# Patient Record
Sex: Female | Born: 1955
Health system: Southern US, Community
[De-identification: ages and names within clinical notes are randomized; demographics above are authoritative.]

## PROBLEM LIST (undated history)

## (undated) DIAGNOSIS — B019 Varicella without complication: Secondary | ICD-10-CM

## (undated) DIAGNOSIS — M549 Dorsalgia, unspecified: Secondary | ICD-10-CM

## (undated) DIAGNOSIS — M4802 Spinal stenosis, cervical region: Secondary | ICD-10-CM

## (undated) DIAGNOSIS — Z789 Other specified health status: Secondary | ICD-10-CM

## (undated) DIAGNOSIS — F329 Major depressive disorder, single episode, unspecified: Secondary | ICD-10-CM

## (undated) DIAGNOSIS — C50919 Malignant neoplasm of unspecified site of unspecified female breast: Secondary | ICD-10-CM

## (undated) DIAGNOSIS — F419 Anxiety disorder, unspecified: Secondary | ICD-10-CM

## (undated) DIAGNOSIS — G8929 Other chronic pain: Secondary | ICD-10-CM

## (undated) DIAGNOSIS — G4700 Insomnia, unspecified: Secondary | ICD-10-CM

## (undated) DIAGNOSIS — Z923 Personal history of irradiation: Secondary | ICD-10-CM

## (undated) DIAGNOSIS — F1090 Alcohol use, unspecified, uncomplicated: Secondary | ICD-10-CM

## (undated) DIAGNOSIS — K635 Polyp of colon: Secondary | ICD-10-CM

## (undated) DIAGNOSIS — N6002 Solitary cyst of left breast: Secondary | ICD-10-CM

## (undated) DIAGNOSIS — J449 Chronic obstructive pulmonary disease, unspecified: Secondary | ICD-10-CM

## (undated) DIAGNOSIS — I10 Essential (primary) hypertension: Secondary | ICD-10-CM

## (undated) DIAGNOSIS — D649 Anemia, unspecified: Secondary | ICD-10-CM

## (undated) DIAGNOSIS — F32A Depression, unspecified: Secondary | ICD-10-CM

## (undated) HISTORY — PX: BREAST SURGERY: SHX581

## (undated) HISTORY — PX: NECK SURGERY: SHX720

## (undated) HISTORY — DX: Varicella without complication: B01.9

## (undated) HISTORY — PX: BREAST BIOPSY: SHX20

## (undated) HISTORY — PX: EPIDURAL BLOCK INJECTION: SHX1516

## (undated) HISTORY — DX: Spinal stenosis, cervical region: M48.02

## (undated) HISTORY — PX: ELBOW SURGERY: SHX618

## (undated) HISTORY — PX: BACK SURGERY: SHX140

## (undated) HISTORY — PX: CERVICAL FUSION: SHX112

## (undated) HISTORY — PX: TONSILLECTOMY: SUR1361

## (undated) HISTORY — DX: Polyp of colon: K63.5

## (undated) HISTORY — DX: Essential (primary) hypertension: I10

## (undated) HISTORY — DX: Insomnia, unspecified: G47.00

## (undated) HISTORY — DX: Chronic obstructive pulmonary disease, unspecified: J44.9

## (undated) HISTORY — PX: CERVICAL CONE BIOPSY: SUR198

---

## 2003-11-15 ENCOUNTER — Other Ambulatory Visit: Admission: RE | Admit: 2003-11-15 | Discharge: 2003-11-15 | Payer: Self-pay | Admitting: Internal Medicine

## 2003-11-15 ENCOUNTER — Encounter: Payer: Self-pay | Admitting: Internal Medicine

## 2003-12-16 ENCOUNTER — Ambulatory Visit: Payer: Self-pay | Admitting: Internal Medicine

## 2004-02-14 ENCOUNTER — Ambulatory Visit: Payer: Self-pay | Admitting: Internal Medicine

## 2004-03-27 ENCOUNTER — Ambulatory Visit: Payer: Self-pay | Admitting: General Surgery

## 2004-06-01 ENCOUNTER — Ambulatory Visit: Payer: Self-pay | Admitting: Family Medicine

## 2004-06-08 ENCOUNTER — Ambulatory Visit: Payer: Self-pay | Admitting: Internal Medicine

## 2004-06-14 ENCOUNTER — Ambulatory Visit: Payer: Self-pay | Admitting: Internal Medicine

## 2005-03-23 ENCOUNTER — Ambulatory Visit: Payer: Self-pay | Admitting: Internal Medicine

## 2005-04-05 ENCOUNTER — Ambulatory Visit: Payer: Self-pay | Admitting: General Surgery

## 2005-07-03 ENCOUNTER — Ambulatory Visit: Payer: Self-pay | Admitting: Internal Medicine

## 2005-07-06 ENCOUNTER — Ambulatory Visit: Payer: Self-pay | Admitting: Internal Medicine

## 2005-10-26 ENCOUNTER — Ambulatory Visit: Payer: Self-pay | Admitting: Internal Medicine

## 2005-12-25 ENCOUNTER — Encounter: Payer: Self-pay | Admitting: Family Medicine

## 2005-12-25 ENCOUNTER — Ambulatory Visit: Payer: Self-pay | Admitting: Family Medicine

## 2006-04-09 ENCOUNTER — Ambulatory Visit: Payer: Self-pay | Admitting: General Surgery

## 2006-12-03 ENCOUNTER — Encounter: Payer: Self-pay | Admitting: Internal Medicine

## 2006-12-23 ENCOUNTER — Telehealth: Payer: Self-pay | Admitting: Internal Medicine

## 2006-12-25 ENCOUNTER — Encounter: Payer: Self-pay | Admitting: Internal Medicine

## 2006-12-25 DIAGNOSIS — F39 Unspecified mood [affective] disorder: Secondary | ICD-10-CM | POA: Insufficient documentation

## 2006-12-25 DIAGNOSIS — J309 Allergic rhinitis, unspecified: Secondary | ICD-10-CM | POA: Insufficient documentation

## 2006-12-31 ENCOUNTER — Ambulatory Visit: Payer: Self-pay | Admitting: Internal Medicine

## 2006-12-31 DIAGNOSIS — G629 Polyneuropathy, unspecified: Secondary | ICD-10-CM | POA: Insufficient documentation

## 2006-12-31 DIAGNOSIS — G589 Mononeuropathy, unspecified: Secondary | ICD-10-CM

## 2007-01-06 LAB — CONVERTED CEMR LAB
ALT: 26 units/L (ref 0–35)
AST: 48 units/L — ABNORMAL HIGH (ref 0–37)
Albumin: 4 g/dL (ref 3.5–5.2)
Alkaline Phosphatase: 99 units/L (ref 39–117)
CO2: 28 meq/L (ref 19–32)
Creatinine, Ser: 0.5 mg/dL (ref 0.4–1.2)
Glucose, Bld: 104 mg/dL — ABNORMAL HIGH (ref 70–99)
HCT: 41.6 % (ref 36.0–46.0)
Hemoglobin: 14.5 g/dL (ref 12.0–15.0)
MCHC: 34.8 g/dL (ref 30.0–36.0)
MCV: 97.2 fL (ref 78.0–100.0)
Monocytes Relative: 5.9 % (ref 3.0–11.0)
Neutrophils Relative %: 77.9 % — ABNORMAL HIGH (ref 43.0–77.0)
Phosphorus: 2.9 mg/dL (ref 2.3–4.6)
Potassium: 4.1 meq/L (ref 3.5–5.1)
Sodium: 140 meq/L (ref 135–145)
Total Bilirubin: 0.9 mg/dL (ref 0.3–1.2)
Vitamin B-12: 228 pg/mL (ref 211–911)

## 2007-01-14 ENCOUNTER — Telehealth: Payer: Self-pay | Admitting: Internal Medicine

## 2007-01-14 ENCOUNTER — Encounter: Payer: Self-pay | Admitting: Internal Medicine

## 2007-01-22 ENCOUNTER — Encounter: Payer: Self-pay | Admitting: Internal Medicine

## 2007-08-26 ENCOUNTER — Telehealth: Payer: Self-pay | Admitting: Internal Medicine

## 2007-08-27 ENCOUNTER — Ambulatory Visit: Payer: Self-pay | Admitting: Internal Medicine

## 2007-09-02 ENCOUNTER — Encounter: Payer: Self-pay | Admitting: Internal Medicine

## 2007-09-03 ENCOUNTER — Telehealth: Payer: Self-pay | Admitting: Internal Medicine

## 2007-12-30 ENCOUNTER — Encounter: Admission: RE | Admit: 2007-12-30 | Discharge: 2007-12-30 | Payer: Self-pay | Admitting: Neurology

## 2008-01-09 ENCOUNTER — Encounter: Payer: Self-pay | Admitting: Internal Medicine

## 2008-02-18 ENCOUNTER — Encounter: Payer: Self-pay | Admitting: Internal Medicine

## 2008-03-05 ENCOUNTER — Encounter: Payer: Self-pay | Admitting: Internal Medicine

## 2008-03-23 ENCOUNTER — Ambulatory Visit (HOSPITAL_COMMUNITY): Admission: RE | Admit: 2008-03-23 | Discharge: 2008-03-24 | Payer: Self-pay | Admitting: Neurological Surgery

## 2008-04-20 ENCOUNTER — Encounter: Admission: RE | Admit: 2008-04-20 | Discharge: 2008-04-20 | Payer: Self-pay | Admitting: Orthopedic Surgery

## 2008-05-24 ENCOUNTER — Encounter: Admission: RE | Admit: 2008-05-24 | Discharge: 2008-05-24 | Payer: Self-pay | Admitting: Neurological Surgery

## 2008-10-18 ENCOUNTER — Encounter: Admission: RE | Admit: 2008-10-18 | Discharge: 2008-10-18 | Payer: Self-pay | Admitting: Neurological Surgery

## 2008-10-20 ENCOUNTER — Ambulatory Visit (HOSPITAL_COMMUNITY): Admission: RE | Admit: 2008-10-20 | Discharge: 2008-10-20 | Payer: Self-pay | Admitting: Neurological Surgery

## 2008-10-21 ENCOUNTER — Ambulatory Visit (HOSPITAL_COMMUNITY): Admission: RE | Admit: 2008-10-21 | Discharge: 2008-10-22 | Payer: Self-pay | Admitting: Neurological Surgery

## 2008-11-09 ENCOUNTER — Encounter: Admission: RE | Admit: 2008-11-09 | Discharge: 2008-11-09 | Payer: Self-pay | Admitting: Neurological Surgery

## 2009-01-14 ENCOUNTER — Telehealth: Payer: Self-pay | Admitting: Internal Medicine

## 2009-01-24 ENCOUNTER — Encounter: Admission: RE | Admit: 2009-01-24 | Discharge: 2009-01-24 | Payer: Self-pay | Admitting: Neurological Surgery

## 2009-02-16 ENCOUNTER — Telehealth: Payer: Self-pay | Admitting: Internal Medicine

## 2009-02-24 ENCOUNTER — Ambulatory Visit: Payer: Self-pay | Admitting: Internal Medicine

## 2009-02-24 DIAGNOSIS — F339 Major depressive disorder, recurrent, unspecified: Secondary | ICD-10-CM | POA: Insufficient documentation

## 2009-04-05 ENCOUNTER — Encounter: Admission: RE | Admit: 2009-04-05 | Discharge: 2009-04-05 | Payer: Self-pay | Admitting: Neurological Surgery

## 2009-04-26 ENCOUNTER — Encounter: Admission: RE | Admit: 2009-04-26 | Discharge: 2009-04-26 | Payer: Self-pay | Admitting: Neurological Surgery

## 2009-04-26 ENCOUNTER — Telehealth: Payer: Self-pay | Admitting: Internal Medicine

## 2009-06-27 ENCOUNTER — Emergency Department: Payer: Self-pay | Admitting: Emergency Medicine

## 2009-08-26 ENCOUNTER — Encounter: Admission: RE | Admit: 2009-08-26 | Discharge: 2009-08-26 | Payer: Self-pay | Admitting: Neurological Surgery

## 2009-11-22 ENCOUNTER — Encounter: Admission: RE | Admit: 2009-11-22 | Discharge: 2009-11-22 | Payer: Self-pay | Admitting: Neurological Surgery

## 2009-12-06 ENCOUNTER — Encounter: Admission: RE | Admit: 2009-12-06 | Discharge: 2009-12-06 | Payer: Self-pay | Admitting: Neurological Surgery

## 2010-02-10 ENCOUNTER — Inpatient Hospital Stay: Payer: Self-pay | Admitting: Psychiatry

## 2010-03-07 NOTE — Progress Notes (Signed)
Summary: change from prozac to cymbaltal?  Phone Note Call from Patient Call back at Home Phone [phone]   Caller: Patient Call For: Cindee Salt MD Summary of Call: Pt has been taking prozac.  She saw her neurosurgeon who talked about putting her on another pain medicine, in addition to vicodin.  When the pt told him she is taking prozac he suggested she go on cymbalta instead, since it has some pain relieving qualities.  Therefore, she wouldnt  have to start anther pain medicine.  Please advise.  Uses [facility] in [place]. Initial call taken by: Lowella Petties CMA,  April 26, 2009 3:09 PM  Follow-up for Phone Call        I haven't been overly impressed with results with cymbalta. It is also very expensive. If he wants to try a nerve pain reliever, I believe she is better off just adding a different medicine like gabapentin or topiramate and not switching the prozac Follow-up by: Cindee Salt MD,  April 27, 2009 7:45 AM  Additional Follow-up for Phone Call Additional follow up Details #1::        left message on machine for patient to return call.  DeShannon Smith CMA Duncan Dull)  April 27, 2009 2:37 PM   Advised pt.  She is already taking neurintin and vicodin.  She will check with her neurosurgeon about him adding something else. Additional Follow-up by: Lowella Petties CMA,  April 27, 2009 4:52 PM    Additional Follow-up for Phone Call Additional follow up Details #2::    It would likely be better to try adding the cymbalta instead of stopping the fluoxetine. They can be used together and I fear a recurrence of her depression if we stop the fluoxetine Cindee Salt MD  April 28, 2009 4:52 PM

## 2010-03-07 NOTE — Progress Notes (Signed)
Summary: Rx Fluoxetine  Phone Note Refill Request Call back at [phone] Message from:  [facility] on February 16, 2009 10:24 AM  Refills Requested: Medication #1:  FLUOXETINE HCL 20 MG CAPS take 1 by mouth once daily.   Last Refilled: 01/14/2009 Received faxed refill request, patient last seen on 08/27/2007, was given Rx on 01/14/2009 and was asked to make follow up appt in 2-3 weeks, no appt made, please advise   Method Requested: Electronic Initial call taken by: Linde Gillis CMA Duncan Dull),  February 16, 2009 10:26 AM  Follow-up for Phone Call        Please call patient I can refill 2 weeks more meds but she really needs an appt If finanacial probelms, we can try to work out the payments or she may qualify for reduced payments through [facility] Follow-up by: Cindee Salt MD,  February 16, 2009 2:00 PM  Additional Follow-up for Phone Call Additional follow up Details #1::        home number is disconnected, pt no longer works at work number provided, will leave message with the pharmacy. DeShannon Katrinka Blazing CMA Duncan Dull)  February 16, 2009 2:55 PM   spoke with pharmacist at [facility] and he gave me a number of [phone], I called number and left message for patient to return call. DeShannon Smith CMA Duncan Dull)  February 16, 2009 3:05 PM   patient called back and stated that she has had several operations since seeing you last and doesn't have health insurance, I transferred the call to Charish to help her with [facility] payment plan. I will call in 2 weeks worth of med to [facility]. DeShannon Smith CMA Duncan Dull)  February 16, 2009 5:01 PM   mailed pt information about making payment arrangements and apply for reduced payments. DeShannon Katrinka Blazing CMA Duncan Dull)  February 16, 2009 5:02 PM     Additional Follow-up for Phone Call Additional follow up Details #2::    okay thanks Follow-up by: Cindee Salt MD,  February 17, 2009 1:22 PM  Prescriptions: FLUOXETINE HCL 20 MG CAPS (FLUOXETINE HCL)  take 1 by mouth once daily  #30 x 0   Entered by:   Mervin Hack CMA (AAMA)   Authorized by:   Cindee Salt MD   Signed by:   Mervin Hack CMA (AAMA) on 02/16/2009   Method used:   Electronically to        [facility] [address] (retail)       [address]       [place], [place]  [address]       Ph: [phone]       Fax: [phone]   RxID:   [id number]
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
## 2010-05-12 LAB — DIFFERENTIAL
Basophils Absolute: 0 10*3/uL (ref 0.0–0.1)
Eosinophils Relative: 2 % (ref 0–5)
Lymphocytes Relative: 58 % — ABNORMAL HIGH (ref 12–46)
Lymphs Abs: 2.8 10*3/uL (ref 0.7–4.0)
Monocytes Absolute: 0.4 10*3/uL (ref 0.1–1.0)
Neutro Abs: 1.5 10*3/uL — ABNORMAL LOW (ref 1.7–7.7)

## 2010-05-12 LAB — APTT: aPTT: 29 seconds (ref 24–37)

## 2010-05-12 LAB — CBC
HCT: 43.7 % (ref 36.0–46.0)
Hemoglobin: 15 g/dL (ref 12.0–15.0)
Platelets: 183 10*3/uL (ref 150–400)
RDW: 13.6 % (ref 11.5–15.5)
WBC: 4.8 10*3/uL (ref 4.0–10.5)

## 2010-05-12 LAB — BASIC METABOLIC PANEL
CO2: 25 mEq/L (ref 19–32)
Chloride: 107 mEq/L (ref 96–112)
GFR calc non Af Amer: >60 mL/min (ref >60)
Glucose, Bld: 93 mg/dL (ref 70–99)
Potassium: 4.2 mEq/L (ref 3.5–5.1)
Sodium: 142 mEq/L (ref 135–145)

## 2010-05-12 LAB — PROTIME-INR: Prothrombin Time: 12.9 seconds (ref 11.6–15.2)

## 2010-05-23 LAB — DIFFERENTIAL
Lymphocytes Relative: 27 % (ref 12–46)
Lymphs Abs: 1.9 10*3/uL (ref 0.7–4.0)
Monocytes Absolute: 0.5 10*3/uL (ref 0.1–1.0)
Monocytes Relative: 7 % (ref 3–12)
Neutro Abs: 4.7 10*3/uL (ref 1.7–7.7)
Neutrophils Relative %: 66 % (ref 43–77)

## 2010-05-23 LAB — BASIC METABOLIC PANEL
CO2: 25 mEq/L (ref 19–32)
Calcium: 10.2 mg/dL (ref 8.4–10.5)
Creatinine, Ser: 0.62 mg/dL (ref 0.4–1.2)
GFR calc Af Amer: >60 mL/min (ref >60)
Sodium: 139 mEq/L (ref 135–145)

## 2010-05-23 LAB — PROTIME-INR: INR: 0.9 (ref 0.00–1.49)

## 2010-05-23 LAB — CBC
Hemoglobin: 15.7 g/dL — ABNORMAL HIGH (ref 12.0–15.0)
RBC: 4.74 MIL/uL (ref 3.87–5.11)
WBC: 7.2 10*3/uL (ref 4.0–10.5)

## 2010-05-23 LAB — APTT: aPTT: 29 seconds (ref 24–37)

## 2010-06-20 NOTE — Assessment & Plan Note (Signed)
NAME:  Ashley Cross, Ashley Cross              ACCOUNT NO.:  [id number]   MEDICAL RECORD NO.:  [id number]          PATIENT TYPE:  POB   LOCATION:  [facility]         FACILITY[facility]  [facility]   PHYSICIAN:  Tinnie Gens, MD        DATE OF BIRTH:  [birth year]   DATE OF SERVICE:  12/25/2005                                    CLINIC NOTE   CHIEF COMPLAINT:  Yearly exam.   HISTORY OF PRESENT ILLNESS:  The patient is a 55 year old gravida 3, para 2,  who comes in today for her yearly examination. She has a history of being in  pain and menopausal. She was prescribed Estroven at her last visit. The  patient is also a smoker since 70. She stopped taking the Loestrin because  she was not having a cycle. The patient has not had a cycle since some time  in the spring. The patient does get annual mammograms, she is followed by a  general surgeon for this. She has a family history of breast cancer. Last  mammogram was 03/2005 and normal.   PAST MEDICAL HISTORY:  Negative.   PAST SURGICAL HISTORY:  She had a diagnostic scope for infertility.   PAST GYN HISTORY:  She had one LEEP for abnormal  PAP and they have been  normal since then.   PAST OB HISTORY:  Questionable early SAB and SVD x2.   MEDICATIONS:  Lexapro 20 mg p.o. daily. ALLERGIES TO SULFA, causes rash.   SOCIAL HISTORY:  She is a three-quarter-pack per day smoker for  approximately the last 30 or so years. She drinks one glass of wine a day.  No other drug use. She works for an [employer], [employer].   FAMILY HISTORY:  Her mom had breast cancer, __________ cancer.  Mom had  hypertension, her father had spinal stenosis.   REVIEW OF SYSTEMS:  Negative for vision changes, hearing loss, URI symptoms,  chest shortness of breath, nausea vomiting, diarrhea, constipation, blood in  her stools, blood in her urine, dysuria, abdominal pain, swelling in her  feet or ankles, masses in her breasts or vaginal discharge.   EXAMINATION:  On examination today,  blood pressure is 120/81, weight is 125  and stable, pulse is 90. She is a well-nourished  female in no acute  distress. HEENT: Normocephalic, atraumatic,  Sclerae anicteric. Neck is  supple. Normal thyroid. Lungs were clear bilaterally. Cardiac: Regular rate  and rhythm. No rubs, gallops or murmurs. Abdomen: Soft, nontender,  nondistended. No masses, no organomegaly. Extremities: No cyanosis, clubbing  or edema.  Breasts were symmetric with everted nipple. Chest/Lungs:  Small  amount of fibrocystic change in the outer quadrants bilaterally, but no  distinct mass. No supraclavicular axillary adenopathy. GU: Normal external  female genitalia. The vagina is somewhat pale and consistent, and has a loss  of __________  and the cervix is small, transition then cannot be seen. The  uterus is very small, anteverted. No adnexal mass or tenderness. BUS is  normal.   IMPRESSION:  1. Menopause.  2. Osteoporosis risk, secondary to thin-white female who is a smoker.  3. Depression.  4. Desire for smoking cessation.  PLAN:  1. Followup mammogram  in 2/08.  2. Start Chantix. Let her take the medication as well. Quit date and      strategy for smoking cessation were all discussed with the patient      today.  3. Prempro 0.3 and 1.5 mg were given to the patient.  4. Menopausal disorders as well as hot flash __________.  Risks and      benefits of this medication, including slight increased risk of breast      cancer, as well as risk of osteoporosis were discussed with this      patient.  5. Start calcium and Vitamin D daily.  6. At age 55, in May of 2008, the patient should contact GI for      colonoscopy.  7. The patient's yearly blood work __________ by her primary care      physician.           ______________________________  Tinnie Gens, MD     TP/MEDQ  D:  12/25/2005  T:  12/25/2005  Job:  [id number]

## 2010-06-20 NOTE — Op Note (Signed)
NAME:  Ashley Cross, Ashley Cross              ACCOUNT NO.:  [id number]   MEDICAL RECORD NO.:  [id number]          PATIENT TYPE:  AMB   LOCATION:  [facility]                          FACILITY:  [facility]   PHYSICIAN:  Tia Alert, MD     DATE OF BIRTH:  1955/10/02   DATE OF PROCEDURE:  DATE OF DISCHARGE:                               OPERATIVE REPORT   PREOPERATIVE DIAGNOSES:  Cervical spondylitic myelopathy with cervical  spinal stenosis C4-5 and C6-7, with signal change in the spinal cord at  both levels.   POSTOPERATIVE DIAGNOSES:  Cervical spondylitic myelopathy with cervical  spinal stenosis C4-5 and C6-7, with signal change in the spinal cord at  both levels.   PROCEDURE:  1. Decompressive anterior cervical diskectomy C4-5, C6-7.  2. Anterior cervical arthrodesis C4-5 utilizing a 6-mm      corticocancellous allograft and at C6-7 utilizing a 7-mm      corticocancellous allograft.  3. Anterior cervical plating C4-5 and C6-7 utilizing Atlantis Venture      plates.   SURGEON:  Tia Alert, MD   ASSISTANT:  Donalee Citrin, MD   ANESTHESIA:  General endotracheal.   COMPLICATIONS:  None apparent.   INDICATIONS FOR PROCEDURE:  [name] is a 55 year old female who  presented with numbness in her hands spasticity of her gait and  hyperreflexia with pain in her neck and arms.  She was seen by a local  neurologist.  MRI showed severe spondylosis with severe degenerative  disk disease with spinal stenosis at C4-5 and C6-7 with signal changes  in spinal cord at both levels.  Given her pain syndrome and her  myelopathy, I recommended decompressive anterior cervical diskectomy  fusion plating at C4-5 and C6-7.  She understood the risks and benefits  and expected outcome and she wished to proceed.   DESCRIPTION OF PROCEDURE:  The patient was taken to the operating room  and after induction of adequate generalized endotracheal anesthesia, she  was placed in supine position on the operating table.  Her  right  anterior cervical region was prepped with DuraPrep and then draped in  the usual sterile fashion.  A 5 mL of local anesthesia was injected and  a transverse incision was made to the right of midline and carried down  to the platysma.  The platysma was elevated, opened, and undermined with  Metzenbaum scissors.  I then dissected a plane medial to the  sternocleidomastoid mastoid muscle and the internal carotid artery and  lateral to the esophagus to expose C4-5, C5-6, and C6-7.  There were  large anterior osteophytes at each level.  Intraoperative fluoroscopy  confirmed my levels and then I was able to take down the longus colli  muscles bilaterally and placed in the shadow line retractors under  these, I removed the anterior osteophytes at C4-5 and C6-7 by biting  them with a Leksell rongeur.  I then started at C6-7, I used a curette  to clean the disk space.  I then used the high-speed drill to drill the  endplates to prepare for later arthrodesis.  The disk space was  very  collapsed and degenerated.  I drilled to a height of about 7-mm  utilizing the drill, drilled down to the level of the posterior spurs  and posterior osteophytes and then went up to C4-5, I then was able to  incise the disk space, and there perform an intradiskal diskectomy with  pituitary rongeurs and curved curettes and then again drilled the  endplates.  The disk space was again very degenerated and collapsed.  I  drilled to a height of about 6-mm and drilled down to the level of the  posterior longitudinal ligament.  The operating microscope was brought  into the field.  I undercut the vertebral bodies to remove the  osteophytes.  I then opened the posterior longitudinal ligament with a  nerve hook and then removed it undercutting the bodies further at C4-5  and C5-6 until the dura was full and capacious all the way across and  bilateral foraminotomies were performed and the nerve hook passed easily  into  the foramina and circumferentially in the midline.  The dura was  then full all the way across by both visualization and palpation.  I  irrigated with saline solution and then measured interspace to be 6 mm  and tapped a 6-mm corticocancellous allograft in the interspace at C4-5  and then placed a 21-mm Atlantis Venture plate into the vertebral bodies  of C4 and C5.  There was a large anterior osteophyte on the anterior  part of C5 and therefore I had to drill this down to allow the plate to  flush against the vertebral bodies.  I then placed two 13-mm variable  angle screws in the bodies of C4 and C5 and these locked in the plate by  locking mechanism within the plate.  I then went down to C6-7 and again  brought in the operating microscope, opened the posterior longitudinal  ligament, and then removed undercutting bodies of C6 and C7.  Bilateral  foraminotomies were performed.  The pedicles were palpable and I did  identify the nerve roots and again decompressed the central canal and  the foramen until nerve hooks could pass easily into the foramina and  circumferentially such that the decompression was adequate by both  visualization and palpation with the nerve hook in a circumferential  fashion.  We then irrigated with saline solution and measured the  interspace to be 7 mm and used a 7-mm corticocancellous allograft and  tapped this into position at C6-7.  We then placed a 23-mm Atlantis  Venture plate and placed two 13-mm variable angle screws in the bodies  of C6 and C7, and these locked into plate by locking mechanism within  the plate.  We then spent considerable time drying the surgical bed with  bipolar cautery and with Surgifoam, and once meticulous hemostasis was  achieved, closed the platysma with 3-0 Vicryl, closing subcuticular  tissue with 3-0 Vicryl, and closed the skin with Benzoin and Steri-  Strips.  The drapes were removed.  Sterile dressing was applied.  The   patient was awakened from general anesthesia and transferred to recovery  room in stable condition.  At the end of the procedure, all sponge,  needle and instrument counts were correct.      Tia Alert, MD  Electronically Signed     DSJ/MEDQ  D:  03/23/2008  T:  03/24/2008  Job:  [id number]

## 2010-09-14 ENCOUNTER — Ambulatory Visit: Payer: Self-pay | Admitting: Internal Medicine

## 2010-11-28 ENCOUNTER — Ambulatory Visit
Admission: RE | Admit: 2010-11-28 | Discharge: 2010-11-28 | Disposition: A | Discharge status: Home / Self Care | Payer: Medicaid Other | Source: Ambulatory Visit | Attending: Neurological Surgery | Admitting: Neurological Surgery

## 2010-11-28 ENCOUNTER — Other Ambulatory Visit: Payer: Self-pay | Admitting: Neurological Surgery

## 2010-11-28 DIAGNOSIS — M542 Cervicalgia: Secondary | ICD-10-CM

## 2010-11-30 ENCOUNTER — Other Ambulatory Visit: Payer: Medicaid Other

## 2010-12-05 ENCOUNTER — Ambulatory Visit
Admission: RE | Admit: 2010-12-05 | Discharge: 2010-12-05 | Disposition: A | Discharge status: Home / Self Care | Payer: Medicaid Other | Source: Ambulatory Visit | Attending: Neurological Surgery | Admitting: Neurological Surgery

## 2010-12-05 DIAGNOSIS — M542 Cervicalgia: Secondary | ICD-10-CM

## 2010-12-05 MED ORDER — ONDANSETRON HCL 4 MG/2ML IJ SOLN
4.0000 mg | Freq: Once | INTRAMUSCULAR | Status: AC
  Administered 2010-12-05: 4 mg via INTRAMUSCULAR

## 2010-12-05 MED ORDER — IOHEXOL 300 MG/ML  SOLN
10.0000 mL | Freq: Once | INTRAMUSCULAR | Status: AC | PRN
  Administered 2010-12-05: 10 mL via INTRATHECAL

## 2010-12-05 MED ORDER — DIAZEPAM 5 MG PO TABS
10.0000 mg | ORAL_TABLET | Freq: Once | ORAL | Status: AC
  Administered 2010-12-05: 10 mg via ORAL

## 2010-12-05 MED ORDER — MEPERIDINE HCL 100 MG/ML IJ SOLN
75.0000 mg | Freq: Once | INTRAMUSCULAR | Status: AC
  Administered 2010-12-05: 75 mg via INTRAMUSCULAR

## 2011-02-21 ENCOUNTER — Encounter (HOSPITAL_COMMUNITY): Payer: Self-pay | Admitting: Pharmacy Technician

## 2011-02-28 ENCOUNTER — Encounter (HOSPITAL_COMMUNITY): Payer: Self-pay

## 2011-02-28 ENCOUNTER — Other Ambulatory Visit (HOSPITAL_COMMUNITY): Payer: Self-pay | Admitting: *Deleted

## 2011-02-28 ENCOUNTER — Encounter (HOSPITAL_COMMUNITY)
Admission: RE | Admit: 2011-02-28 | Discharge: 2011-02-28 | Disposition: A | Discharge status: Home / Self Care | Payer: Medicaid Other | Source: Ambulatory Visit | Attending: Neurological Surgery | Admitting: Neurological Surgery

## 2011-02-28 HISTORY — DX: Anxiety disorder, unspecified: F41.9

## 2011-02-28 HISTORY — DX: Major depressive disorder, single episode, unspecified: F32.9

## 2011-02-28 HISTORY — DX: Depression, unspecified: F32.A

## 2011-02-28 LAB — DIFFERENTIAL
Basophils Relative: 1 % (ref 0–1)
Lymphocytes Relative: 39 % (ref 12–46)
Lymphs Abs: 2.9 10*3/uL (ref 0.7–4.0)
Monocytes Absolute: 0.5 10*3/uL (ref 0.1–1.0)
Monocytes Relative: 7 % (ref 3–12)
Neutro Abs: 3.9 10*3/uL (ref 1.7–7.7)
Neutrophils Relative %: 53 % (ref 43–77)

## 2011-02-28 LAB — COMPREHENSIVE METABOLIC PANEL
AST: 21 U/L (ref 0–37)
Albumin: 3.7 g/dL (ref 3.5–5.2)
BUN: 7 mg/dL (ref 6–23)
Calcium: 9.7 mg/dL (ref 8.4–10.5)
Chloride: 100 mEq/L (ref 96–112)
Creatinine, Ser: 0.44 mg/dL — ABNORMAL LOW (ref 0.50–1.10)
Total Protein: 7.4 g/dL (ref 6.0–8.3)

## 2011-02-28 LAB — CBC
HCT: 44.5 % (ref 36.0–46.0)
MCHC: 33.5 g/dL (ref 30.0–36.0)
MCV: 98.2 fL (ref 78.0–100.0)
Platelets: 222 10*3/uL (ref 150–400)
RDW: 13.6 % (ref 11.5–15.5)
WBC: 7.4 10*3/uL (ref 4.0–10.5)

## 2011-02-28 LAB — APTT: aPTT: 30 seconds (ref 24–37)

## 2011-02-28 LAB — SURGICAL PCR SCREEN
MRSA, PCR: NEGATIVE
Staphylococcus aureus: NEGATIVE

## 2011-02-28 LAB — PROTIME-INR: INR: 0.99 (ref 0.00–1.49)

## 2011-02-28 NOTE — Pre-Procedure Instructions (Signed)
20 Ashley Cross  02/28/2011   Your procedure is scheduled on:03-07-2011 @ 11:30 AM Report to [facility] at 9:30 AM.  Call this number if you have problems the morning of surgery: [phone]   Remember:   Do not eat food:After Midnight.  May have clear liquids: up to 4 Hours before arrival.  Clear liquids include soda, tea, black coffee, apple or grape juice, broth.5:30 AM  Take these medicines the morning of surgery with A SIP OF WATER atenolol,celxa,klonopin,neurotin,oxycodone,   Do not wear jewelry, make-up or nail polish.  Do not wear lotions, powders, or perfumes. You may wear deodorant.  Do not shave 48 hours prior to surgery.  Do not bring valuables to the hospital.  Contacts, dentures or bridgework may not be worn into surgery.  Leave suitcase in the car. After surgery it may be brought to your room.  For patients admitted to the hospital, checkout time is 11:00 AM the day of discharge.       Special Instructions: CHG Shower Use Special Wash: 1/2 bottle night before surgery and 1/2 bottle morning of surgery.   Please read over the following fact sheets that you were given: Pain Booklet, MRSA Information and Surgical Site Infection Prevention @

## 2011-03-06 MED ORDER — CEFAZOLIN SODIUM 1-5 GM-% IV SOLN
1.0000 g | INTRAVENOUS | Status: AC
  Administered 2011-03-07: 1 g via INTRAVENOUS
  Filled 2011-03-06: qty 50

## 2011-03-07 ENCOUNTER — Encounter (HOSPITAL_COMMUNITY): Payer: Self-pay | Admitting: *Deleted

## 2011-03-07 ENCOUNTER — Ambulatory Visit (HOSPITAL_COMMUNITY): Payer: Medicaid Other

## 2011-03-07 ENCOUNTER — Ambulatory Visit (HOSPITAL_COMMUNITY): Payer: Medicaid Other | Admitting: *Deleted

## 2011-03-07 ENCOUNTER — Ambulatory Visit (HOSPITAL_COMMUNITY)
Admission: RE | Admit: 2011-03-07 | Discharge: 2011-03-09 | Disposition: A | Discharge status: Home / Self Care | Payer: Medicaid Other | Source: Ambulatory Visit | Attending: Neurological Surgery | Admitting: Neurological Surgery

## 2011-03-07 ENCOUNTER — Encounter (HOSPITAL_COMMUNITY)
Admission: RE | Disposition: A | Discharge status: Home / Self Care | Payer: Self-pay | Source: Ambulatory Visit | Attending: Neurological Surgery

## 2011-03-07 DIAGNOSIS — Y838 Other surgical procedures as the cause of abnormal reaction of the patient, or of later complication, without mention of misadventure at the time of the procedure: Secondary | ICD-10-CM | POA: Insufficient documentation

## 2011-03-07 DIAGNOSIS — T84498A Other mechanical complication of other internal orthopedic devices, implants and grafts, initial encounter: Secondary | ICD-10-CM | POA: Insufficient documentation

## 2011-03-07 DIAGNOSIS — F3289 Other specified depressive episodes: Secondary | ICD-10-CM | POA: Insufficient documentation

## 2011-03-07 DIAGNOSIS — F411 Generalized anxiety disorder: Secondary | ICD-10-CM | POA: Insufficient documentation

## 2011-03-07 DIAGNOSIS — F329 Major depressive disorder, single episode, unspecified: Secondary | ICD-10-CM | POA: Insufficient documentation

## 2011-03-07 DIAGNOSIS — Z01812 Encounter for preprocedural laboratory examination: Secondary | ICD-10-CM | POA: Insufficient documentation

## 2011-03-07 DIAGNOSIS — M4712 Other spondylosis with myelopathy, cervical region: Secondary | ICD-10-CM | POA: Insufficient documentation

## 2011-03-07 DIAGNOSIS — Z981 Arthrodesis status: Secondary | ICD-10-CM

## 2011-03-07 DIAGNOSIS — F17200 Nicotine dependence, unspecified, uncomplicated: Secondary | ICD-10-CM | POA: Insufficient documentation

## 2011-03-07 HISTORY — PX: POSTERIOR CERVICAL FUSION/FORAMINOTOMY: SHX5038

## 2011-03-07 SURGERY — POSTERIOR CERVICAL FUSION/FORAMINOTOMY LEVEL 4
Anesthesia: General

## 2011-03-07 MED ORDER — ATENOLOL 50 MG PO TABS
50.0000 mg | ORAL_TABLET | Freq: Every day | ORAL | Status: DC
  Administered 2011-03-08 – 2011-03-09 (×2): 50 mg via ORAL
  Filled 2011-03-07 (×2): qty 1

## 2011-03-07 MED ORDER — ZOLPIDEM TARTRATE 10 MG PO TABS
10.0000 mg | ORAL_TABLET | Freq: Every evening | ORAL | Status: DC | PRN
  Administered 2011-03-07 – 2011-03-08 (×2): 10 mg via ORAL
  Filled 2011-03-07 (×2): qty 1

## 2011-03-07 MED ORDER — SODIUM CHLORIDE 0.9 % IJ SOLN: 3.0000 mL | INTRAMUSCULAR | Status: DC | PRN

## 2011-03-07 MED ORDER — BUPIVACAINE HCL (PF) 0.5 % IJ SOLN
INTRAMUSCULAR | Status: DC | PRN
  Administered 2011-03-07: 7 mL

## 2011-03-07 MED ORDER — HYDROMORPHONE HCL PF 1 MG/ML IJ SOLN
INTRAMUSCULAR | Status: AC
  Filled 2011-03-07: qty 1

## 2011-03-07 MED ORDER — BACITRACIN ZINC 500 UNIT/GM EX OINT
TOPICAL_OINTMENT | CUTANEOUS | Status: DC | PRN
  Administered 2011-03-07: 1 via TOPICAL

## 2011-03-07 MED ORDER — HYDROMORPHONE HCL PF 1 MG/ML IJ SOLN
0.2500 mg | INTRAMUSCULAR | Status: DC | PRN
  Administered 2011-03-07 (×2): 0.5 mg via INTRAVENOUS

## 2011-03-07 MED ORDER — PROPOFOL 10 MG/ML IV EMUL
INTRAVENOUS | Status: DC | PRN
  Administered 2011-03-07: 50 mg via INTRAVENOUS
  Administered 2011-03-07: 200 mg via INTRAVENOUS

## 2011-03-07 MED ORDER — PHENOL 1.4 % MT LIQD
1.0000 | OROMUCOSAL | Status: DC | PRN
  Filled 2011-03-07: qty 177

## 2011-03-07 MED ORDER — ACETAMINOPHEN 650 MG RE SUPP: 650.0000 mg | RECTAL | Status: DC | PRN

## 2011-03-07 MED ORDER — CEFAZOLIN SODIUM 1-5 GM-% IV SOLN
1.0000 g | Freq: Three times a day (TID) | INTRAVENOUS | Status: AC
  Administered 2011-03-07 – 2011-03-08 (×2): 1 g via INTRAVENOUS
  Filled 2011-03-07 (×2): qty 50

## 2011-03-07 MED ORDER — BACITRACIN 50000 UNITS IM SOLR
INTRAMUSCULAR | Status: AC
  Filled 2011-03-07: qty 1

## 2011-03-07 MED ORDER — SUCCINYLCHOLINE CHLORIDE 20 MG/ML IJ SOLN
INTRAMUSCULAR | Status: DC | PRN
  Administered 2011-03-07: 100 mg via INTRAVENOUS

## 2011-03-07 MED ORDER — LACTATED RINGERS IV SOLN
INTRAVENOUS | Status: DC | PRN
  Administered 2011-03-07 (×3): via INTRAVENOUS

## 2011-03-07 MED ORDER — OXYCODONE-ACETAMINOPHEN 5-325 MG PO TABS
1.0000 | ORAL_TABLET | Freq: Four times a day (QID) | ORAL | Status: DC | PRN
  Administered 2011-03-07: 1 via ORAL
  Administered 2011-03-07 – 2011-03-08 (×2): 2 via ORAL
  Filled 2011-03-07 (×3): qty 2

## 2011-03-07 MED ORDER — MORPHINE SULFATE 4 MG/ML IJ SOLN
1.0000 mg | INTRAMUSCULAR | Status: DC | PRN
  Administered 2011-03-07 – 2011-03-08 (×6): 4 mg via INTRAVENOUS
  Filled 2011-03-07 (×5): qty 1

## 2011-03-07 MED ORDER — DEXAMETHASONE SODIUM PHOSPHATE 10 MG/ML IJ SOLN
10.0000 mg | Freq: Once | INTRAMUSCULAR | Status: AC
  Administered 2011-03-07: 10 mg via INTRAVENOUS
  Filled 2011-03-07: qty 1

## 2011-03-07 MED ORDER — NEOSTIGMINE METHYLSULFATE 1 MG/ML IJ SOLN
INTRAMUSCULAR | Status: DC | PRN
  Administered 2011-03-07: 4 mg via INTRAVENOUS

## 2011-03-07 MED ORDER — HEMOSTATIC AGENTS (NO CHARGE) OPTIME
TOPICAL | Status: DC | PRN
  Administered 2011-03-07: 1 via TOPICAL

## 2011-03-07 MED ORDER — ONDANSETRON HCL 4 MG/2ML IJ SOLN
INTRAMUSCULAR | Status: DC | PRN
  Administered 2011-03-07: 4 mg via INTRAVENOUS

## 2011-03-07 MED ORDER — THROMBIN 5000 UNITS EX SOLR
CUTANEOUS | Status: DC | PRN
  Administered 2011-03-07 (×2): 5000 [IU] via TOPICAL

## 2011-03-07 MED ORDER — ROCURONIUM BROMIDE 100 MG/10ML IV SOLN
INTRAVENOUS | Status: DC | PRN
  Administered 2011-03-07: 10 mg via INTRAVENOUS
  Administered 2011-03-07: 30 mg via INTRAVENOUS
  Administered 2011-03-07: 5 mg via INTRAVENOUS
  Administered 2011-03-07: 10 mg via INTRAVENOUS

## 2011-03-07 MED ORDER — GLYCOPYRROLATE 0.2 MG/ML IJ SOLN
INTRAMUSCULAR | Status: DC | PRN
  Administered 2011-03-07: .8 mg via INTRAVENOUS

## 2011-03-07 MED ORDER — DEXTROSE 5 % IV SOLN
500.0000 mg | Freq: Four times a day (QID) | INTRAVENOUS | Status: DC | PRN
  Administered 2011-03-07: 500 mg via INTRAVENOUS
  Filled 2011-03-07: qty 5

## 2011-03-07 MED ORDER — METHOCARBAMOL 100 MG/ML IJ SOLN
500.0000 mg | INTRAVENOUS | Status: AC
  Administered 2011-03-07: 500 mg via INTRAVENOUS
  Filled 2011-03-07: qty 5

## 2011-03-07 MED ORDER — ONDANSETRON HCL 4 MG/2ML IJ SOLN: 4.0000 mg | Freq: Four times a day (QID) | INTRAMUSCULAR | Status: DC | PRN

## 2011-03-07 MED ORDER — MORPHINE SULFATE 4 MG/ML IJ SOLN
INTRAMUSCULAR | Status: AC
  Administered 2011-03-07: 4 mg via INTRAVENOUS
  Filled 2011-03-07: qty 1

## 2011-03-07 MED ORDER — POTASSIUM CHLORIDE IN NACL 20-0.9 MEQ/L-% IV SOLN
INTRAVENOUS | Status: DC
  Administered 2011-03-07 – 2011-03-08 (×2): via INTRAVENOUS
  Filled 2011-03-07 (×5): qty 1000

## 2011-03-07 MED ORDER — CLONAZEPAM 0.5 MG PO TABS
0.5000 mg | ORAL_TABLET | Freq: Two times a day (BID) | ORAL | Status: DC | PRN
  Administered 2011-03-09: 0.5 mg via ORAL
  Filled 2011-03-07 (×2): qty 1

## 2011-03-07 MED ORDER — SODIUM CHLORIDE 0.9 % IV SOLN
INTRAVENOUS | Status: AC
  Filled 2011-03-07: qty 500

## 2011-03-07 MED ORDER — ACETAMINOPHEN 325 MG PO TABS: 650.0000 mg | ORAL_TABLET | ORAL | Status: DC | PRN

## 2011-03-07 MED ORDER — SODIUM CHLORIDE 0.9 % IJ SOLN
3.0000 mL | Freq: Two times a day (BID) | INTRAMUSCULAR | Status: DC
  Administered 2011-03-07 – 2011-03-08 (×2): 3 mL via INTRAVENOUS

## 2011-03-07 MED ORDER — SODIUM CHLORIDE 0.9 % IR SOLN
Status: DC | PRN
  Administered 2011-03-07: 11:00:00

## 2011-03-07 MED ORDER — MIDAZOLAM HCL 5 MG/5ML IJ SOLN
INTRAMUSCULAR | Status: DC | PRN
  Administered 2011-03-07: 2 mg via INTRAVENOUS

## 2011-03-07 MED ORDER — 0.9 % SODIUM CHLORIDE (POUR BTL) OPTIME
TOPICAL | Status: DC | PRN
  Administered 2011-03-07: 1000 mL

## 2011-03-07 MED ORDER — GABAPENTIN 300 MG PO CAPS
600.0000 mg | ORAL_CAPSULE | Freq: Three times a day (TID) | ORAL | Status: DC
  Administered 2011-03-07 – 2011-03-09 (×5): 600 mg via ORAL
  Filled 2011-03-07 (×7): qty 2

## 2011-03-07 MED ORDER — CITALOPRAM HYDROBROMIDE 40 MG PO TABS
40.0000 mg | ORAL_TABLET | Freq: Every day | ORAL | Status: DC
  Administered 2011-03-08 – 2011-03-09 (×2): 40 mg via ORAL
  Filled 2011-03-07 (×2): qty 1

## 2011-03-07 MED ORDER — FENTANYL CITRATE 0.05 MG/ML IJ SOLN
INTRAMUSCULAR | Status: DC | PRN
  Administered 2011-03-07: 100 ug via INTRAVENOUS
  Administered 2011-03-07: 50 ug via INTRAVENOUS
  Administered 2011-03-07 (×2): 100 ug via INTRAVENOUS

## 2011-03-07 MED ORDER — ONDANSETRON HCL 4 MG/2ML IJ SOLN: 4.0000 mg | INTRAMUSCULAR | Status: DC | PRN

## 2011-03-07 MED ORDER — SENNA 8.6 MG PO TABS
1.0000 | ORAL_TABLET | Freq: Two times a day (BID) | ORAL | Status: DC
  Administered 2011-03-07 – 2011-03-09 (×4): 8.6 mg via ORAL
  Filled 2011-03-07 (×5): qty 1

## 2011-03-07 MED ORDER — METHOCARBAMOL 500 MG PO TABS
500.0000 mg | ORAL_TABLET | Freq: Four times a day (QID) | ORAL | Status: DC | PRN
  Administered 2011-03-07 – 2011-03-09 (×6): 500 mg via ORAL
  Filled 2011-03-07 (×6): qty 1

## 2011-03-07 MED ORDER — SODIUM CHLORIDE 0.9 % IV SOLN
10.0000 mg | INTRAVENOUS | Status: DC | PRN
  Administered 2011-03-07: 10 ug/min via INTRAVENOUS

## 2011-03-07 MED ORDER — SODIUM CHLORIDE 0.9 % IV SOLN
250.0000 mL | INTRAVENOUS | Status: DC
  Administered 2011-03-07: 250 mL via INTRAVENOUS

## 2011-03-07 MED ORDER — MENTHOL 3 MG MT LOZG: 1.0000 | LOZENGE | OROMUCOSAL | Status: DC | PRN

## 2011-03-07 SURGICAL SUPPLY — 73 items
APL SKNCLS STERI-STRIP NONHPOA (GAUZE/BANDAGES/DRESSINGS)
BAG DECANTER FOR FLEXI CONT (MISCELLANEOUS) ×2 IMPLANT
BENZOIN TINCTURE PRP APPL 2/3 (GAUZE/BANDAGES/DRESSINGS) IMPLANT
BIT DRILL MOUNTAINER FXED 12MM (DRILL) ×1 IMPLANT
BIT DRILL NEURO 2X3.1 SFT TUCH (MISCELLANEOUS) ×1 IMPLANT
BLADE SURG 11 STRL SS (BLADE) ×2 IMPLANT
BLADE SURG ROTATE 9660 (MISCELLANEOUS) IMPLANT
BLADE ULTRA TIP 2M (BLADE) IMPLANT
BUR MATCHSTICK NEURO 3.0 LAGG (BURR) ×2 IMPLANT
BUR PRECISION FLUTE 5.0 (BURR) ×2 IMPLANT
CANISTER SUCTION 2500CC (MISCELLANEOUS) ×2 IMPLANT
CLOTH BEACON ORANGE TIMEOUT ST (SAFETY) ×2 IMPLANT
CONT SPEC 4OZ CLIKSEAL STRL BL (MISCELLANEOUS) ×2 IMPLANT
DRAPE LAPAROTOMY 100X72 PEDS (DRAPES) ×2 IMPLANT
DRAPE MICROSCOPE LEICA (MISCELLANEOUS) IMPLANT
DRAPE POUCH INSTRU U-SHP 10X18 (DRAPES) ×2 IMPLANT
DRESSING TELFA 8X3 (GAUZE/BANDAGES/DRESSINGS) ×2 IMPLANT
DRILL MOUNTAINEER FIXED 12MM (DRILL) ×2
DRILL NEURO 2X3.1 SOFT TOUCH (MISCELLANEOUS) ×2
DRSG PAD ABDOMINAL 8X10 ST (GAUZE/BANDAGES/DRESSINGS) IMPLANT
DURAPREP 6ML APPLICATOR 50/CS (WOUND CARE) ×2 IMPLANT
ELECT REM PT RETURN 9FT ADLT (ELECTROSURGICAL) ×2
ELECTRODE REM PT RTRN 9FT ADLT (ELECTROSURGICAL) ×1 IMPLANT
EVACUATOR 1/8 PVC DRAIN (DRAIN) ×2 IMPLANT
GAUZE SPONGE 4X4 16PLY XRAY LF (GAUZE/BANDAGES/DRESSINGS) IMPLANT
GLOVE BIO SURGEON STRL SZ8 (GLOVE) ×4 IMPLANT
GLOVE BIOGEL PI IND STRL 7.5 (GLOVE) ×1 IMPLANT
GLOVE BIOGEL PI IND STRL 8 (GLOVE) ×3 IMPLANT
GLOVE BIOGEL PI IND STRL 8.5 (GLOVE) ×2 IMPLANT
GLOVE BIOGEL PI INDICATOR 7.5 (GLOVE) ×1
GLOVE BIOGEL PI INDICATOR 8 (GLOVE) ×3
GLOVE BIOGEL PI INDICATOR 8.5 (GLOVE) ×2
GLOVE ECLIPSE 7.5 STRL STRAW (GLOVE) ×6 IMPLANT
GLOVE EXAM NITRILE LRG STRL (GLOVE) IMPLANT
GLOVE EXAM NITRILE MD LF STRL (GLOVE) IMPLANT
GLOVE EXAM NITRILE XL STR (GLOVE) IMPLANT
GLOVE EXAM NITRILE XS STR PU (GLOVE) IMPLANT
GOWN BRE IMP SLV AUR LG STRL (GOWN DISPOSABLE) IMPLANT
GOWN BRE IMP SLV AUR XL STRL (GOWN DISPOSABLE) ×6 IMPLANT
GOWN STRL REIN 2XL LVL4 (GOWN DISPOSABLE) ×4 IMPLANT
HEMOSTAT SURGICEL 2X14 (HEMOSTASIS) IMPLANT
KIT BASIN OR (CUSTOM PROCEDURE TRAY) ×2 IMPLANT
KIT ROOM TURNOVER OR (KITS) ×2 IMPLANT
MARKER SKIN DUAL TIP RULER LAB (MISCELLANEOUS) ×2 IMPLANT
MIX DBX 10CC 35% BONE (Bone Implant) ×2 IMPLANT
MOUNTAINEER FIXED DRILL BIT 12MM ×2 IMPLANT
MOUNTAINEER POLY SCREW 4.0X20MM ×2 IMPLANT
NEEDLE HYPO 18GX1.5 BLUNT FILL (NEEDLE) IMPLANT
NEEDLE HYPO 25X1 1.5 SAFETY (NEEDLE) ×2 IMPLANT
NEEDLE SPNL 22GX3.5 QUINCKE BK (NEEDLE) ×2 IMPLANT
NS IRRIG 1000ML POUR BTL (IV SOLUTION) ×2 IMPLANT
PACK LAMINECTOMY NEURO (CUSTOM PROCEDURE TRAY) ×2 IMPLANT
PIN MAYFIELD SKULL DISP (PIN) ×2 IMPLANT
ROD MOUTAINEER 3.5X200MM (Rod) ×2 IMPLANT
RUBBERBAND STERILE (MISCELLANEOUS) IMPLANT
SCREW F A 3.5X12 (Screw) ×16 IMPLANT
SCREW INNER (Screw) ×20 IMPLANT
SCREW MOUNTAINEER ML 4X20MM (Screw) ×2 IMPLANT
SPONGE GAUZE 4X4 12PLY (GAUZE/BANDAGES/DRESSINGS) ×2 IMPLANT
SPONGE SURGIFOAM ABS GEL SZ50 (HEMOSTASIS) ×2 IMPLANT
STAPLER SKIN PROX WIDE 3.9 (STAPLE) IMPLANT
STRIP CLOSURE SKIN 1/2X4 (GAUZE/BANDAGES/DRESSINGS) ×2 IMPLANT
SUT ETHILON 3 0 FSL (SUTURE) ×2 IMPLANT
SUT VIC AB 0 CT1 18XCR BRD8 (SUTURE) ×2 IMPLANT
SUT VIC AB 0 CT1 8-18 (SUTURE) ×2
SUT VIC AB 2-0 CP2 18 (SUTURE) ×4 IMPLANT
SUT VIC AB 3-0 SH 8-18 (SUTURE) ×4 IMPLANT
SYR 20ML ECCENTRIC (SYRINGE) ×2 IMPLANT
TOWEL OR 17X24 6PK STRL BLUE (TOWEL DISPOSABLE) ×2 IMPLANT
TOWEL OR 17X26 10 PK STRL BLUE (TOWEL DISPOSABLE) ×4 IMPLANT
TRAY FOLEY CATH 14FRSI W/METER (CATHETERS) IMPLANT
UNDERPAD 30X30 INCONTINENT (UNDERPADS AND DIAPERS) ×2 IMPLANT
WATER STERILE IRR 1000ML POUR (IV SOLUTION) ×2 IMPLANT

## 2011-03-07 NOTE — Transfer of Care (Signed)
Immediate Anesthesia Transfer of Care Note  Patient: Ashley Cross  Procedure(s) Performed:  POSTERIOR CERVICAL FUSION/FORAMINOTOMY LEVEL 4 - cervical three to thoracic one posterior cervical fusion   Patient Location: PACU  Anesthesia Type: General  Level of Consciousness: awake and alert   Airway & Oxygen Therapy: Patient Spontanous Breathing and Patient connected to nasal cannula oxygen  Post-op Assessment: Report given to PACU RN and Post -op Vital signs reviewed and stable  Post vital signs: Reviewed and stable  Complications: No apparent anesthesia complications

## 2011-03-07 NOTE — Anesthesia Postprocedure Evaluation (Signed)
Anesthesia Post Note  Patient: Ashley Cross  Procedure(s) Performed:  POSTERIOR CERVICAL FUSION/FORAMINOTOMY LEVEL 4 - cervical three to thoracic one posterior cervical fusion   Anesthesia type: General  Patient location: PACU  Post pain: Pain level controlled and Adequate analgesia  Post assessment: Post-op Vital signs reviewed, Patient's Cardiovascular Status Stable, Respiratory Function Stable, Patent Airway and Pain level controlled  Last Vitals:  Filed Vitals:   03/07/11 1330  BP:   Pulse: 77  Temp:   Resp: 18    Post vital signs: Reviewed and stable  Level of consciousness: awake, alert  and oriented  Complications: No apparent anesthesia complications

## 2011-03-07 NOTE — Preoperative (Signed)
Beta Blockers   Reason not to administer Beta Blockers:Not Applicable 

## 2011-03-07 NOTE — Op Note (Signed)
03/07/2011  1:24 PM  PATIENT:  Ashley Cross  56 y.o. female  PRE-OPERATIVE DIAGNOSIS:  1. Pseudoarthrosis C4-5 and C 67 2. Adjacent level spondylosis C3-4 and C7-T1, 3. Neck pain  POST-OPERATIVE DIAGNOSIS:  Same  PROCEDURE:  1. Posterior cervical fusion C3-T1 utilizing morcellized allograft and morselized autograft, 2. Posterior cervical fixation C3-T1 utilizing lateral mass screws from C3-C6 and pedicle screws at T1, 3. Cervical foraminotomies C7-T1 bilaterally  SURGEON:  Marikay Alar, MD  ASSISTANTS: Dr. Newell Coral  ANESTHESIA:   General  EBL: 50 ml  Total I/O In: 2000 [I.V.:2000] Out: 75 [Blood:75]  BLOOD ADMINISTERED:none  DRAINS: Medium Hemovac   SPECIMEN:  No Specimen  INDICATION FOR PROCEDURE: This is a patient who presented with severe myelopathy and and underwent ACDF with plating at C4-5 C5-6 and C6-7. She had developed neck pain over time and was found to have adjacent level spondylosis at C3-4 and C7-T1. It was also felt that she had a pseudoarthrosis at C4-5 the potentially a C6-7. She tried medical management wasn't out without significant relief. Recommended a posterior cervical fusion from C3-T1.  Patient understood the risks, benefits, and alternatives and potential outcomes and wished to proceed.  PROCEDURE DETAILS: The patient was brought to the operating room. Generalized endotracheal anesthesia was induced. The patient was affixed a 3 point Mayfield headrest and rolled into the prone position on chest rolls. All pressure points were padded. The posterior cervical region was cleaned and prepped with DuraPrep and then draped in the usual sterile fashion. 7 cc of local anesthesia was injected and a dorsal midline incision made in the posterior cervical region and carried down to the cervical fascia. The fascia was opened and the paraspinous musculature was taken down to expose C3-T1. Intraoperative fluoroscopy confirmed my level and then the dissection was carried  out over the lateral facets. I localized the midpoint of each lateral mass and marked a region 1 mm medial to the midpoint of the lateral mass, and then drilled in an upward and outward direction into the safe zone of each lateral mass. I drilled to a depth of 12 mm and then checked my drill hole with a ball probe. I then placed a 12 mm lateral mass screws into the safe zone of each lateral mass until they were 2 fingers tight. This was done from C3-C6 bilaterally.  Foraminotomies were performed at C7-T1 bilaterally. I then could palpate the pedicles bilaterally of T1 and drilled a starting hole about 15 medial and 15 inferior. I then used the hand drill to drill in that direction until I was 20 mm deep. I checked this with a ball probe, and then placed 4-0 by 20 mm pedicle screws into the T1 pedicles bilaterally. I then decorticated the lateral masses and the facet joints and packed them with local autograft and morcellized allograft to perform arthrodesis from C3-T1. I then placed rods into the multiaxial screw heads of the screws and locked these in position with the locking caps and anti-torque device. I then checked the final construct with fluoroscopy. I irrigated with saline solution containing bacitracin. A placed a medium Hemovac drain through separate stab incision, and lying the dura with Gelfoam. After hemostasis was achieved a closed the muscle and the fascia with 0 Vicryl, subcutaneous tissue with 2-0 Vicryl, and the subcuticular tissue with 3-0 Vicryl. The skin was closed with benzoin and Steri-Strips. A sterile dressing was applied, the patient was turned to the supine physician and taken out of the headrest,  awakened from general anesthesia and transferred to the recovery room in stable condition. At the end of the procedure all sponge, needle and instrument counts were correct.   PLAN OF CARE: Admit for overnight observation  PATIENT DISPOSITION:  PACU - hemodynamically stable.   Delay  start of Pharmacological VTE agent (>24hrs) due to surgical blood loss or risk of bleeding:  yes

## 2011-03-07 NOTE — Progress Notes (Signed)
Pt. Takes atenolol to control heart rate

## 2011-03-07 NOTE — Progress Notes (Signed)
Patient ID: Ashley Cross, female   DOB: [birth year], 55 y.o.   MRN: [id number] Doing well post-op, approp sore, no new neuro sxs, Neuro stable from pre-op.

## 2011-03-07 NOTE — H&P (Signed)
Subjective:   Patient is a 56 y.o. female admitted for posterior cervical fusion for pseudoarthrosis and spondylosis. The patient first presented to me with complaints of neck pain and numbness of the arm(s). Onset of symptoms was several years ago. The pain is described as aching, stabbing and stiffness and occurs intermittently. The pain is rated moderate, and is located at the without radiation.. The symptoms have been progressive. Symptoms are exacerbated by extending head backwards, and are relieved by narcotic analgesics including oxycodone/acetaminophen (Percocet, Tylox). History positive for myelopathy status post previous anterior cervical discectomies. Previous work up includes cervical spine x-rays, results: severe arthritis changes, MRI of cervical spine, results: Multilevel spondylosis and CT of cervical spine, results: Multilevel spondylosis with pseudoarthrosis.  Past Medical History  Diagnosis Date  . Anxiety   . Dysrhythmia     tachycardia  takes atenolol  . Depression     Past Surgical History  Procedure Date  . Cervical fusion     times 2  . Breast surgery     breast biopsy  . Cervical cone biopsy   . Tonsillectomy     Allergies  Allergen Reactions  . Sulfonamide Derivatives Itching and Rash    History  Substance Use Topics  . Smoking status: Current Everyday Smoker -- 1.0 packs/day for 30 years    Types: Cigarettes  . Smokeless tobacco: Not on file  . Alcohol Use: 2.4 oz/week    4 Glasses of wine per week    History reviewed. No pertinent family history. Prior to Admission medications   Medication Sig Start Date End Date Taking? Authorizing Provider  atenolol (TENORMIN) 50 MG tablet Take 50 mg by mouth daily.   Yes Historical Provider, MD  citalopram (CELEXA) 40 MG tablet Take 40 mg by mouth daily.   Yes Historical Provider, MD  clonazePAM (KLONOPIN) 0.5 MG tablet Take 0.5 mg by mouth 2 (two) times daily as needed. anxiety   Yes Historical Provider, MD    gabapentin (NEURONTIN) 300 MG capsule Take 600 mg by mouth 3 (three) times daily.   Yes Historical Provider, MD  oxyCODONE-acetaminophen (PERCOCET) 5-325 MG per tablet Take 2 tablets by mouth every 6 (six) hours as needed. For pain   Yes Historical Provider, MD  pravastatin (PRAVACHOL) 20 MG tablet Take 20 mg by mouth daily.   Yes Historical Provider, MD     Review of Systems  Positive ROS: neg  All other systems have been reviewed and were otherwise negative with the exception of those mentioned in the HPI and as above.  Objective: Vital signs in last 24 hours: Temp:  [98.1 F (36.7 C)] 98.1 F (36.7 C) (01/30 0930) Pulse Rate:  [65] 65  (01/30 0930) Resp:  [18] 18  (01/30 0930) BP: (95)/(60) 95/60 mmHg (01/30 0930) SpO2:  [94 %] 94 % (01/30 0930)  General Appearance: Alert, cooperative, no distress, appears stated age Head: Normocephalic, without obvious abnormality, atraumatic Eyes: PERRL, conjunctiva/corneas clear, EOM's intact, fundi benign, both eyes      Ears: Normal TM's and external ear canals, both ears Throat: Lips, mucosa, and tongue normal; teeth and gums normal Neck: Supple, symmetrical, trachea midline, no adenopathy; thyroid: No enlargement/tenderness/nodules; no carotid bruit or JVD Back: Symmetric, no curvature, ROM normal, no CVA tenderness Lungs: Clear to auscultation bilaterally, respirations unlabored Heart: Regular rate and rhythm, S1 and S2 normal, no murmur, rub or gallop Abdomen: Soft, non-tender, bowel sounds active all four quadrants, no masses, no organomegaly Extremities: Extremities normal, atraumatic, no cyanosis  or edema Pulses: 2+ and symmetric all extremities Skin: Skin color, texture, turgor normal, no rashes or lesions  NEUROLOGIC:  Mental status: Alert and oriented x4, no aphasia, good attention span, fund of knowledge and memory  Motor Exam - grossly normal except for mild grip weakness and mild spasticity Sensory Exam - grossly normal  except for some decreased sensation in the hands Reflexes: Mild hyperreflexia Coordination - mildly decreased from hyperreflexia and spasticity Gait - mildly spastic Balance - grossly normal Cranial Nerves: I: smell Not tested  II: visual acuity  OS: nl    OD: nl  II: visual fields Full to confrontation  II: pupils Equal, round, reactive to light  III,VII: ptosis None  III,IV,VI: extraocular muscles  Full ROM  V: mastication Normal  V: facial light touch sensation  Normal  V,VII: corneal reflex  Present  VII: facial muscle function - upper  Normal  VII: facial muscle function - lower Normal  VIII: hearing Not tested  IX: soft palate elevation  Normal  IX,X: gag reflex Present  XI: trapezius strength  5/5  XI: sternocleidomastoid strength 5/5  XI: neck flexion strength  5/5  XII: tongue strength  Normal    Data Review Lab Results  Component Value Date   WBC 7.4 02/28/2011   HGB 14.9 02/28/2011   HCT 44.5 02/28/2011   MCV 98.2 02/28/2011   PLT 222 02/28/2011   Lab Results  Component Value Date   NA 138 02/28/2011   K 4.6 02/28/2011   CL 100 02/28/2011   CO2 29 02/28/2011   BUN 7 02/28/2011   CREATININE 0.44* 02/28/2011   GLUCOSE 86 02/28/2011   Lab Results  Component Value Date   INR 0.99 02/28/2011    Assessment:   Cervical neck pain with herniated nucleus pulposus/ spondylosis/ stenosis at multiple levels. She has a pseudoarthrosis at C4-5 and potentially at C6-7. She has spondylosis above and below her previous fusion. Patient has failed conservative therapy. I have recommended a posterior cervical fusion from C3-T1.  Plan:   I explained the condition and procedure to the patient and answered any questions.  Patient wishes to proceed with procedure as planned. Understands risks/ benefits/ and expected or typical outcomes.  Ashley Cross S 03/07/2011 10:23 AM

## 2011-03-07 NOTE — Anesthesia Preprocedure Evaluation (Addendum)
Anesthesia Evaluation  Patient identified by MRN, date of birth, ID band Patient awake    Reviewed: Allergy & Precautions, H&P , NPO status , Patient's Chart, lab work & pertinent test results  History of Anesthesia Complications Negative for: history of anesthetic complications  Airway Mallampati: III TM Distance: >3 FB Neck ROM: Limited    Dental  (+) Dental Advisory Given   Pulmonary Current Smoker,          Cardiovascular + dysrhythmias     Neuro/Psych Anxiety Depression    GI/Hepatic negative GI ROS, Neg liver ROS,   Endo/Other  Negative Endocrine ROS  Renal/GU negative Renal ROS  Genitourinary negative   Musculoskeletal negative musculoskeletal ROS (+)   Abdominal   Peds  Hematology negative hematology ROS (+)   Anesthesia Other Findings   Reproductive/Obstetrics                           Anesthesia Physical Anesthesia Plan  ASA: III  Anesthesia Plan: General   Post-op Pain Management:    Induction: Intravenous  Airway Management Planned: Oral ETT  Additional Equipment:   Intra-op Plan:   Post-operative Plan: Extubation in OR  Informed Consent:   Dental advisory given  Plan Discussed with: CRNA, Surgeon and Anesthesiologist  Anesthesia Plan Comments:         Anesthesia Quick Evaluation

## 2011-03-08 MED ORDER — MORPHINE SULFATE 4 MG/ML IJ SOLN
INTRAMUSCULAR | Status: AC
  Administered 2011-03-08: 4 mg via INTRAVENOUS
  Filled 2011-03-08: qty 1

## 2011-03-08 MED ORDER — OXYCODONE-ACETAMINOPHEN 5-325 MG PO TABS
1.0000 | ORAL_TABLET | ORAL | Status: DC | PRN
  Administered 2011-03-08: 2 via ORAL
  Administered 2011-03-08 (×3): 1 via ORAL
  Administered 2011-03-09 (×2): 2 via ORAL
  Filled 2011-03-08 (×6): qty 2

## 2011-03-08 MED ORDER — MORPHINE SULFATE 4 MG/ML IJ SOLN
1.0000 mg | INTRAMUSCULAR | Status: DC | PRN
  Administered 2011-03-08 – 2011-03-09 (×10): 4 mg via INTRAVENOUS
  Filled 2011-03-08 (×9): qty 1

## 2011-03-08 NOTE — Progress Notes (Signed)
Patient ID: Ashley Cross, female   DOB: 01-04-1956, 56 y.o.   MRN: [id number] NEUROSURGERY PROGRESS NOTE  Doing well. Complains of appropriate neck soreness. No arm pain No numbness, tingling or weakness: pre-op myelopathy stable Ambulating and voiding well Good strength and sensation Incision CDI  Temp:  [97.3 F (36.3 C)-98.7 F (37.1 C)] 98.3 F (36.8 C) (01/31 0600) Pulse Rate:  [77-92] 80  (01/31 0600) Resp:  [14-18] 18  (01/31 0600) BP: (101-136)/(56-100) 136/81 mmHg (01/31 0600) SpO2:  [93 %-98 %] 95 % (01/31 0600) Weight:  [74.5 kg (164 lb 3.9 oz)] 74.5 kg (164 lb 3.9 oz) (01/30 1545)  Plan: Continue pain control, continue to mobilize  Tia Alert, MD 03/08/2011 9:55 AM

## 2011-03-09 NOTE — Discharge Summary (Signed)
Physician Discharge Summary  Patient ID: Ashley Cross MRN: [id number] DOB/AGE: 1955-09-08 56 y.o.  Admit date: 03/07/2011 Discharge date: 03/09/2011  Admission Diagnoses: cervical spondylosis with myelopathy   Discharge Diagnoses: same   Discharged Condition: stable  Hospital Course: The patient was admitted on 03/07/2011 and taken to the operating room where the patient underwent PCF C3-T1. The patient tolerated the procedure well and was taken to the recovery room and then to the floor in stable condition. The hospital course was routine. There were no complications. The wound remained clean dry and intact. The patient remained afebrile with stable vital signs, and tolerated a regular diet. The patient continued to increase activities, and pain was well controlled with oral pain medications.   Consults: None  Significant Diagnostic Studies:  Results for orders placed during the hospital encounter of 02/28/11  APTT      Component Value Range   aPTT 30  24 - 37 (seconds)  CBC      Component Value Range   WBC 7.4  4.0 - 10.5 (K/uL)   RBC 4.53  3.87 - 5.11 (MIL/uL)   Hemoglobin 14.9  12.0 - 15.0 (g/dL)   HCT 74.2  59.5 - 63.8 (%)   MCV 98.2  78.0 - 100.0 (fL)   MCH 32.9  26.0 - 34.0 (pg)   MCHC 33.5  30.0 - 36.0 (g/dL)   RDW 75.6  43.3 - 29.5 (%)   Platelets 222  150 - 400 (K/uL)  COMPREHENSIVE METABOLIC PANEL      Component Value Range   Sodium 138  135 - 145 (mEq/L)   Potassium 4.6  3.5 - 5.1 (mEq/L)   Chloride 100  96 - 112 (mEq/L)   CO2 29  19 - 32 (mEq/L)   Glucose, Bld 86  70 - 99 (mg/dL)   BUN 7  6 - 23 (mg/dL)   Creatinine, Ser 1.88 (*) 0.50 - 1.10 (mg/dL)   Calcium 9.7  8.4 - 41.6 (mg/dL)   Total Protein 7.4  6.0 - 8.3 (g/dL)   Albumin 3.7  3.5 - 5.2 (g/dL)   AST 21  0 - 37 (U/L)   ALT 26  0 - 35 (U/L)   Alkaline Phosphatase 91  39 - 117 (U/L)   Total Bilirubin 0.2 (*) 0.3 - 1.2 (mg/dL)   GFR calc non Af Amer >90  >90 (mL/min)   GFR calc Af Amer >90  >90  (mL/min)  DIFFERENTIAL      Component Value Range   Neutrophils Relative 53  43 - 77 (%)   Neutro Abs 3.9  1.7 - 7.7 (K/uL)   Lymphocytes Relative 39  12 - 46 (%)   Lymphs Abs 2.9  0.7 - 4.0 (K/uL)   Monocytes Relative 7  3 - 12 (%)   Monocytes Absolute 0.5  0.1 - 1.0 (K/uL)   Eosinophils Relative 1  0 - 5 (%)   Eosinophils Absolute 0.1  0.0 - 0.7 (K/uL)   Basophils Relative 1  0 - 1 (%)   Basophils Absolute 0.0  0.0 - 0.1 (K/uL)  PROTIME-INR      Component Value Range   Prothrombin Time 13.3  11.6 - 15.2 (seconds)   INR 0.99  0.00 - 1.49   SURGICAL PCR SCREEN      Component Value Range   MRSA, PCR NEGATIVE  NEGATIVE    Staphylococcus aureus NEGATIVE  NEGATIVE     X-ray Chest Pa Or Ap  03/07/2011  *RADIOLOGY REPORT*  Clinical Data: Cervical fusion.  Tobacco use.  Tachycardia.  CHEST - 1 VIEW  Comparison: 03/19/2008  Findings: Linear subsegmental atelectasis or scarring noted peripherally at the left lung base.  Borderline cardiomegaly noted.  The lungs appear otherwise clear.  Lower cervical plate screw fixator noted.  IMPRESSION:  1.  Left lower lobe subsegmental atelectasis or scar. 2.  Borderline cardiomegaly.  Original Report Authenticated By: Dellia Cloud, M.D.   Dg Cervical Spine 2-3 Views  03/07/2011  *RADIOLOGY REPORT*  Clinical Data: Postop C3-T1 posterior fusion.  OPERATIVE CERVICAL SPINE - 2-3 VIEW 03/07/2011:  Comparison: Cervical CT myelogram 12/05/2010.  Findings: 2 spot images from the C-arm fluoroscopic device, AP and lateral views, were submitted for interpretation post-operatively. Posterolateral fusion with hardware from C3 through T1, with bilateral pedicle screws at C3, C4, C5, C6, and T1.  I am only able to visualize through the C5-6 level on the lateral image.  Anatomic alignment through this level.  Prior C5-6 ACDF.  IMPRESSION: Posterior C3-T1 fusion.  Original Report Authenticated By: Arnell Sieving, M.D.    Antibiotics:  Anti-infectives     Start      Dose/Rate Route Frequency Ordered Stop   03/07/11 1800   ceFAZolin (ANCEF) IVPB 1 g/50 mL premix        1 g 100 mL/hr over 30 Minutes Intravenous Every 8 hours 03/07/11 1558 03/08/11 0256   03/07/11 1040   bacitracin 50,000 Units in sodium chloride irrigation 0.9 % 500 mL irrigation  Status:  Discontinued          As needed 03/07/11 1109 03/07/11 1317   03/07/11 1030   bacitracin [address] UNITS injection     Comments: RATCLIFF, ESTHER: cabinet override         03/07/11 1030 03/07/11 2229   03/06/11 1545   ceFAZolin (ANCEF) IVPB 1 g/50 mL premix        1 g 100 mL/hr over 30 Minutes Intravenous 60 min pre-op 03/06/11 1532 03/07/11 1117          Discharge Exam: Blood pressure 167/82, pulse 83, temperature 97.6 F (36.4 C), temperature source Oral, resp. rate 20, height 5\' 6"  (1.676 m), weight 74.5 kg (164 lb 3.9 oz), SpO2 92.00%. Neurologic: Grossly normal with stable myelopathy Incision CDI  Discharge Medications:   Medication List  As of 03/09/2011  7:50 AM   TAKE these medications         atenolol 50 MG tablet   Commonly known as: TENORMIN   Take 50 mg by mouth daily.      citalopram 40 MG tablet   Commonly known as: CELEXA   Take 40 mg by mouth daily.      clonazePAM 0.5 MG tablet   Commonly known as: KLONOPIN   Take 0.5 mg by mouth 2 (two) times daily as needed. anxiety      gabapentin 300 MG capsule   Commonly known as: NEURONTIN   Take 600 mg by mouth 3 (three) times daily.      oxyCODONE-acetaminophen 5-325 MG per tablet   Commonly known as: PERCOCET   Take 2 tablets by mouth every 6 (six) hours as needed. For pain      pravastatin 20 MG tablet   Commonly known as: PRAVACHOL   Take 20 mg by mouth daily.            Disposition: home   Final Dx: cervical fusion  Discharge Orders    Future Orders Please Complete By Expires  Diet - low sodium heart healthy      Increase activity slowly      Lifting restrictions      Comments:   less than 10 lbs     Call MD for:  temperature >100.4      Call MD for:  persistant nausea and vomiting      Call MD for:  severe uncontrolled pain      Call MD for:  redness, tenderness, or signs of infection (pain, swelling, redness, odor or green/yellow discharge around incision site)      Call MD for:  difficulty breathing, headache or visual disturbances         Follow-up Information    Follow up with Phallon Haydu S, MD in 2 weeks.   Contact information:   [address] [place] [address] [phone]           Signed: Tia Alert 03/09/2011, 7:50 AM

## 2011-03-12 ENCOUNTER — Encounter (HOSPITAL_COMMUNITY): Payer: Self-pay | Admitting: Neurological Surgery

## 2011-04-10 ENCOUNTER — Other Ambulatory Visit: Payer: Self-pay | Admitting: Neurological Surgery

## 2011-04-10 ENCOUNTER — Ambulatory Visit
Admission: RE | Admit: 2011-04-10 | Discharge: 2011-04-10 | Disposition: A | Discharge status: Home / Self Care | Payer: Medicaid Other | Source: Ambulatory Visit | Attending: Neurological Surgery | Admitting: Neurological Surgery

## 2011-04-10 DIAGNOSIS — M4712 Other spondylosis with myelopathy, cervical region: Secondary | ICD-10-CM

## 2012-10-31 DIAGNOSIS — M5416 Radiculopathy, lumbar region: Secondary | ICD-10-CM | POA: Insufficient documentation

## 2012-11-04 ENCOUNTER — Other Ambulatory Visit: Payer: Self-pay | Admitting: Neurological Surgery

## 2012-11-04 DIAGNOSIS — M5416 Radiculopathy, lumbar region: Secondary | ICD-10-CM

## 2012-11-04 DIAGNOSIS — M47812 Spondylosis without myelopathy or radiculopathy, cervical region: Secondary | ICD-10-CM

## 2012-11-09 ENCOUNTER — Ambulatory Visit
Admission: RE | Admit: 2012-11-09 | Discharge: 2012-11-09 | Disposition: A | Discharge status: Home / Self Care | Payer: No Typology Code available for payment source | Source: Ambulatory Visit | Attending: Neurological Surgery | Admitting: Neurological Surgery

## 2012-11-09 DIAGNOSIS — M5416 Radiculopathy, lumbar region: Secondary | ICD-10-CM

## 2012-11-09 DIAGNOSIS — M47812 Spondylosis without myelopathy or radiculopathy, cervical region: Secondary | ICD-10-CM

## 2012-11-10 ENCOUNTER — Ambulatory Visit: Payer: Self-pay | Admitting: Internal Medicine

## 2014-01-12 ENCOUNTER — Ambulatory Visit: Payer: Self-pay | Admitting: Internal Medicine

## 2015-02-10 ENCOUNTER — Other Ambulatory Visit: Payer: Self-pay | Admitting: Internal Medicine

## 2015-02-10 ENCOUNTER — Encounter: Payer: Self-pay | Admitting: Internal Medicine

## 2015-02-10 DIAGNOSIS — F419 Anxiety disorder, unspecified: Secondary | ICD-10-CM | POA: Diagnosis not present

## 2015-02-10 DIAGNOSIS — Z0001 Encounter for general adult medical examination with abnormal findings: Secondary | ICD-10-CM | POA: Diagnosis not present

## 2015-02-10 DIAGNOSIS — Z124 Encounter for screening for malignant neoplasm of cervix: Secondary | ICD-10-CM | POA: Diagnosis not present

## 2015-02-10 DIAGNOSIS — E559 Vitamin D deficiency, unspecified: Secondary | ICD-10-CM | POA: Diagnosis not present

## 2015-02-10 DIAGNOSIS — N63 Unspecified lump in unspecified breast: Secondary | ICD-10-CM

## 2015-02-10 DIAGNOSIS — T300 Burn of unspecified body region, unspecified degree: Secondary | ICD-10-CM | POA: Diagnosis not present

## 2015-02-10 LAB — HM PAP SMEAR: HM PAP: NEGATIVE

## 2015-02-18 ENCOUNTER — Ambulatory Visit
Admission: RE | Admit: 2015-02-18 | Discharge: 2015-02-18 | Disposition: A | Discharge status: Home / Self Care | Payer: PPO | Source: Ambulatory Visit | Attending: Internal Medicine | Admitting: Internal Medicine

## 2015-02-18 ENCOUNTER — Other Ambulatory Visit: Payer: Self-pay | Admitting: Internal Medicine

## 2015-02-18 DIAGNOSIS — N63 Unspecified lump in unspecified breast: Secondary | ICD-10-CM

## 2015-03-16 DIAGNOSIS — G5621 Lesion of ulnar nerve, right upper limb: Secondary | ICD-10-CM | POA: Diagnosis not present

## 2015-03-16 DIAGNOSIS — G959 Disease of spinal cord, unspecified: Secondary | ICD-10-CM | POA: Diagnosis not present

## 2015-03-16 DIAGNOSIS — M545 Low back pain: Secondary | ICD-10-CM | POA: Diagnosis not present

## 2015-03-24 DIAGNOSIS — E559 Vitamin D deficiency, unspecified: Secondary | ICD-10-CM | POA: Diagnosis not present

## 2015-03-24 DIAGNOSIS — G4700 Insomnia, unspecified: Secondary | ICD-10-CM | POA: Diagnosis not present

## 2015-04-01 DIAGNOSIS — Z8371 Family history of colonic polyps: Secondary | ICD-10-CM | POA: Diagnosis not present

## 2015-04-15 ENCOUNTER — Encounter: Payer: Self-pay | Admitting: *Deleted

## 2015-04-18 ENCOUNTER — Ambulatory Visit: Payer: PPO | Admitting: Anesthesiology

## 2015-04-18 ENCOUNTER — Ambulatory Visit
Admission: RE | Admit: 2015-04-18 | Discharge: 2015-04-18 | Disposition: A | Discharge status: Home / Self Care | Payer: PPO | Source: Ambulatory Visit | Attending: Gastroenterology | Admitting: Gastroenterology

## 2015-04-18 ENCOUNTER — Encounter: Payer: Self-pay | Admitting: *Deleted

## 2015-04-18 ENCOUNTER — Encounter
Admission: RE | Disposition: A | Discharge status: Home / Self Care | Payer: Self-pay | Source: Ambulatory Visit | Attending: Gastroenterology

## 2015-04-18 DIAGNOSIS — F419 Anxiety disorder, unspecified: Secondary | ICD-10-CM | POA: Diagnosis not present

## 2015-04-18 DIAGNOSIS — D124 Benign neoplasm of descending colon: Secondary | ICD-10-CM | POA: Diagnosis not present

## 2015-04-18 DIAGNOSIS — G709 Myoneural disorder, unspecified: Secondary | ICD-10-CM | POA: Insufficient documentation

## 2015-04-18 DIAGNOSIS — F329 Major depressive disorder, single episode, unspecified: Secondary | ICD-10-CM | POA: Diagnosis not present

## 2015-04-18 DIAGNOSIS — D122 Benign neoplasm of ascending colon: Secondary | ICD-10-CM | POA: Insufficient documentation

## 2015-04-18 DIAGNOSIS — K649 Unspecified hemorrhoids: Secondary | ICD-10-CM | POA: Diagnosis not present

## 2015-04-18 DIAGNOSIS — Z981 Arthrodesis status: Secondary | ICD-10-CM | POA: Diagnosis not present

## 2015-04-18 DIAGNOSIS — K5730 Diverticulosis of large intestine without perforation or abscess without bleeding: Secondary | ICD-10-CM | POA: Insufficient documentation

## 2015-04-18 DIAGNOSIS — K562 Volvulus: Secondary | ICD-10-CM | POA: Insufficient documentation

## 2015-04-18 DIAGNOSIS — Z1211 Encounter for screening for malignant neoplasm of colon: Secondary | ICD-10-CM | POA: Diagnosis not present

## 2015-04-18 DIAGNOSIS — K5790 Diverticulosis of intestine, part unspecified, without perforation or abscess without bleeding: Secondary | ICD-10-CM | POA: Diagnosis not present

## 2015-04-18 DIAGNOSIS — K640 First degree hemorrhoids: Secondary | ICD-10-CM | POA: Insufficient documentation

## 2015-04-18 DIAGNOSIS — Z79899 Other long term (current) drug therapy: Secondary | ICD-10-CM | POA: Diagnosis not present

## 2015-04-18 DIAGNOSIS — F17210 Nicotine dependence, cigarettes, uncomplicated: Secondary | ICD-10-CM | POA: Diagnosis not present

## 2015-04-18 DIAGNOSIS — Z8371 Family history of colonic polyps: Secondary | ICD-10-CM | POA: Diagnosis not present

## 2015-04-18 DIAGNOSIS — K635 Polyp of colon: Secondary | ICD-10-CM | POA: Diagnosis not present

## 2015-04-18 DIAGNOSIS — D120 Benign neoplasm of cecum: Secondary | ICD-10-CM | POA: Diagnosis not present

## 2015-04-18 HISTORY — PX: COLONOSCOPY WITH PROPOFOL: SHX5780

## 2015-04-18 SURGERY — COLONOSCOPY WITH PROPOFOL
Anesthesia: General

## 2015-04-18 MED ORDER — LIDOCAINE HCL (CARDIAC) 20 MG/ML IV SOLN
INTRAVENOUS | Status: DC | PRN
  Administered 2015-04-18: 100 mg via INTRAVENOUS

## 2015-04-18 MED ORDER — PROPOFOL 500 MG/50ML IV EMUL
INTRAVENOUS | Status: DC | PRN
  Administered 2015-04-18: 140 ug/kg/min via INTRAVENOUS

## 2015-04-18 MED ORDER — SODIUM CHLORIDE 0.9 % IV SOLN
INTRAVENOUS | Status: DC
  Administered 2015-04-18: 1000 mL via INTRAVENOUS

## 2015-04-18 MED ORDER — PROPOFOL 10 MG/ML IV BOLUS
INTRAVENOUS | Status: DC | PRN
  Administered 2015-04-18: 70 mg via INTRAVENOUS
  Administered 2015-04-18: 18 mg via INTRAVENOUS
  Administered 2015-04-18: 36 mg via INTRAVENOUS
  Administered 2015-04-18: 18 mg via INTRAVENOUS

## 2015-04-18 MED ORDER — FENTANYL CITRATE (PF) 100 MCG/2ML IJ SOLN
INTRAMUSCULAR | Status: DC | PRN
  Administered 2015-04-18 (×2): 50 ug via INTRAVENOUS

## 2015-04-18 MED ORDER — MIDAZOLAM HCL 5 MG/5ML IJ SOLN
INTRAMUSCULAR | Status: DC | PRN
  Administered 2015-04-18: 2 mg via INTRAVENOUS

## 2015-04-18 NOTE — H&P (Signed)
Primary Care Physician:  Perrin Maltese, MD  Pre-Procedure History & Physical: HPI:  Ashley Cross is a 60 y.o. female is here for an colonoscopy.   Past Medical History  Diagnosis Date  . Anxiety   . Dysrhythmia     tachycardia  takes atenolol  . Depression     Past Surgical History  Procedure Laterality Date  . Cervical fusion      times 2  . Breast surgery      breast biopsy  . Cervical cone biopsy    . Tonsillectomy    . Posterior cervical fusion/foraminotomy  03/07/2011    Procedure: POSTERIOR CERVICAL FUSION/FORAMINOTOMY LEVEL 4;  Surgeon: Eustace Moore, MD;  Location: [place] [facility];  Service: Neurosurgery;  Laterality: N/A;  cervical three to thoracic one posterior cervical fusion   . Breast biopsy Left     core- neg  . Back surgery      Prior to Admission medications   Medication Sig Start Date End Date Taking? Authorizing Provider  citalopram (CELEXA) 40 MG tablet Take 40 mg by mouth daily.   Yes Historical Provider, MD  gabapentin (NEURONTIN) 300 MG capsule Take 600 mg by mouth 3 (three) times daily.   Yes Historical Provider, MD  oxyCODONE-acetaminophen (PERCOCET) 5-325 MG per tablet Take 2 tablets by mouth every 6 (six) hours as needed. For pain   Yes Historical Provider, MD  atenolol (TENORMIN) 50 MG tablet Take 50 mg by mouth daily. Reported on 04/18/2015    Historical Provider, MD  clonazePAM (KLONOPIN) 0.5 MG tablet Take 0.5 mg by mouth 2 (two) times daily as needed. Reported on 04/18/2015    Historical Provider, MD  pravastatin (PRAVACHOL) 20 MG tablet Take 20 mg by mouth daily. Reported on 04/18/2015    Historical Provider, MD    Allergies as of 04/07/2015 - Review Complete 03/07/2011  Allergen Reaction Noted  . Sulfonamide derivatives Itching and Rash     Family History  Problem Relation Age of Onset  . Breast cancer Mother 77    Social History   Social History  . Marital Status: Divorced    Spouse Name: N/A  . Number of Children: N/A  .  Years of Education: N/A   Occupational History  . Not on file.   Social History Main Topics  . Smoking status: Current Every Day Smoker -- 1.00 packs/day for 30 years    Types: Cigarettes  . Smokeless tobacco: Not on file  . Alcohol Use: 2.4 oz/week    4 Glasses of wine per week  . Drug Use: No  . Sexual Activity: Not on file   Other Topics Concern  . Not on file   Social History Narrative     Physical Exam: BP 153/67 mmHg  Pulse 78  Temp(Src) 96.9 F (36.1 C) (Tympanic)  Resp 16  Ht 5\' 4"  (1.626 m)  Wt 58.968 kg (130 lb)  BMI 22.30 kg/m2  SpO2 100% General:   Alert,  pleasant and cooperative in NAD Head:  Normocephalic and atraumatic. Neck:  Supple; no masses or thyromegaly. Lungs:  Clear throughout to auscultation.    Heart:  Regular rate and rhythm. Abdomen:  Soft, nontender and nondistended. Normal bowel sounds, without guarding, and without rebound.   Neurologic:  Alert and  oriented x4;  grossly normal neurologically.  Impression/Plan: [name] is here for an colonoscopy to be performed for screening,  fam hx polyps at age > 5  Risks, benefits, limitations, and alternatives regarding  colonoscopy have been reviewed with the patient.  Questions have been answered.  All parties agreeable.   [name], MD  04/18/2015, 2:05 PM

## 2015-04-18 NOTE — Anesthesia Postprocedure Evaluation (Signed)
Anesthesia Post Note  Patient: Ashley Cross  Procedure(s) Performed: Procedure(s) (LRB): COLONOSCOPY WITH PROPOFOL (N/A)  Patient location during evaluation: Endoscopy Anesthesia Type: General Level of consciousness: awake and alert Pain management: pain level controlled Vital Signs Assessment: post-procedure vital signs reviewed and stable Respiratory status: spontaneous breathing, nonlabored ventilation, respiratory function stable and patient connected to nasal cannula oxygen Cardiovascular status: blood pressure returned to baseline and stable Postop Assessment: no signs of nausea or vomiting Anesthetic complications: no    Last Vitals:  Filed Vitals:   04/18/15 1535 04/18/15 1545  BP: 125/86 138/64  Pulse: 80 72  Temp:    Resp: 19 18    Last Pain:  Filed Vitals:   04/18/15 1547  PainSc: 6                  Broadus John K Piscitello

## 2015-04-18 NOTE — Op Note (Signed)
[facility] Gastroenterology Patient Name: Ashley Cross Procedure Date: 04/18/2015 2:06 PM MRN: [id number] Account #: [id number] Date of Birth: 1955-11-19 Admit Type: Outpatient Age: 60 Room: [facility] ENDO ROOM 1 Gender: Female Note Status: Finalized Procedure:            Colonoscopy Indications:          Colon cancer screening in patient at increased risk:                        Family history of 1st-degree relative with colon polyps                        at age 64 years (or older), This is the patient's first                        colonoscopy Patient Profile:      This is a 60 year old female. Providers:            Gerrit Heck. Rayann Heman, MD Referring MD:         Olivia Mackie Mclean-Scocuzza (Referring MD) Medicines:            Propofol per Anesthesia Complications:        No immediate complications. Procedure:            Pre-Anesthesia Assessment:                       - Prior to the procedure, a History and Physical was                        performed, and patient medications, allergies and                        sensitivities were reviewed. The patient's tolerance of                        previous anesthesia was reviewed.                       After obtaining informed consent, the colonoscope was                        passed under direct vision. Throughout the procedure,                        the patient's blood pressure, pulse, and oxygen                        saturations were monitored continuously. The                        Colonoscope was introduced through the anus and                        advanced to the the cecum, identified by appendiceal                        orifice and ileocecal valve. The colonoscopy was                        unusually difficult due to significant looping and a  tortuous colon. Successful completion of the procedure                        was aided by using manual pressure. The patient   tolerated the procedure well. The quality of the bowel                        preparation was good. Findings:      The perianal and digital rectal examinations were normal.      Four sessile polyps were found in the ascending colon. The polyps were 3       to 4 mm in size. These polyps were removed with a jumbo cold forceps.       Resection and retrieval were complete.      Two sessile polyps were found in the descending colon. The polyps were 2       to 3 mm in size. These polyps were removed with a jumbo cold forceps.       Resection and retrieval were complete.      A 15 mm polyp was found in the ascending colon 3 folds behind IC valve.       The polyp was flat. Area was tattooed with an injection of 4 mL of Spot       (carbon black) in 3 locations just distal to the polyp.      Polyp in difficult position behind a fold and lift was attempted but       could not be obtained. NOT REMOVED.      A few small and large-mouthed diverticula were found in the sigmoid       colon.      Internal hemorrhoids were found during retroflexion. The hemorrhoids       were Grade I (internal hemorrhoids that do not prolapse).      The exam was otherwise without abnormality. Impression:           - Four 3 to 4 mm polyps in the ascending colon, removed                        with a jumbo cold forceps. Resected and retrieved.                       - Two 2 to 3 mm polyps in the descending colon, removed                        with a jumbo cold forceps. Resected and retrieved.                       - One 15 mm polyp in the ascending colon 3 folds behind                        IC valve. Tattooed just distal to polyp. Polyp in                        difficult position behind a fold and effective lift                        could not be obtained. NOT REMOVED                       -  Diverticulosis in the sigmoid colon.                       - Internal hemorrhoids.                       - The examination was  otherwise normal. Recommendation:       - Observe patient in GI recovery unit.                       - Continue present medications.                       - Await pathology results.                       - Refer to [facility] clinic at appointment to                        be scheduled for removal of asc colon polyp.                       - The findings and recommendations were discussed with                        the patient.                       - The findings and recommendations were discussed with                        the patient's family. Procedure Code(s):    --- Professional ---                       [phone], Colonoscopy, flexible; with biopsy, single or                        multiple                       45381, Colonoscopy, flexible; with directed submucosal                        injection(s), any substance Diagnosis Code(s):    --- Professional ---                       Z83.71, Family history of colonic polyps                       D12.2, Benign neoplasm of ascending colon                       D12.4, Benign neoplasm of descending colon                       K64.0, First degree hemorrhoids                       K57.30, Diverticulosis of large intestine without                        perforation or abscess without bleeding CPT copyright 2016 [employer]. All rights reserved. The codes documented in  this report are preliminary and upon coder review may  be revised to meet current compliance requirements. Mellody Life, MD 04/18/2015 3:23:13 PM This report has been signed electronically. Number of Addenda: 0 Note Initiated On: 04/18/2015 2:06 PM Scope Withdrawal Time: 0 hours 30 minutes 19 seconds  Total Procedure Duration: 0 hours 48 minutes 9 seconds       [facility]

## 2015-04-18 NOTE — Transfer of Care (Signed)
Immediate Anesthesia Transfer of Care Note  Patient: Ashley Cross  Procedure(s) Performed: Procedure(s): COLONOSCOPY WITH PROPOFOL (N/A)  Patient Location: PACU  Anesthesia Type:General  Level of Consciousness: awake, alert , oriented and patient cooperative  Airway & Oxygen Therapy: Patient Spontanous Breathing and Patient connected to nasal cannula oxygen  Post-op Assessment: Report given to RN and Post -op Vital signs reviewed and stable  Post vital signs: Reviewed and stable  Last Vitals:  Filed Vitals:   04/18/15 1321 04/18/15 1518  BP: 153/67 119/66  Pulse: 78 81  Temp: 36.1 C 36.1 C  Resp: 16 20    Complications: No apparent anesthesia complications

## 2015-04-18 NOTE — Anesthesia Preprocedure Evaluation (Signed)
Anesthesia Evaluation  Patient identified by MRN, date of birth, ID band Patient awake    Reviewed: Allergy & Precautions, H&P , NPO status , Patient's Chart, lab work & pertinent test results  History of Anesthesia Complications Negative for: history of anesthetic complications  Airway Mallampati: III  TM Distance: >3 FB Neck ROM: limited    Dental  (+) Poor Dentition   Pulmonary neg shortness of breath, Current Smoker,    Pulmonary exam normal breath sounds clear to auscultation       Cardiovascular Exercise Tolerance: Good (-) angina(-) Past MI and (-) DOE Normal cardiovascular exam+ dysrhythmias  Rhythm:regular Rate:Normal     Neuro/Psych PSYCHIATRIC DISORDERS Anxiety Depression  Neuromuscular disease negative psych ROS   GI/Hepatic negative GI ROS, Neg liver ROS, neg GERD  ,  Endo/Other  negative endocrine ROS  Renal/GU negative Renal ROS  negative genitourinary   Musculoskeletal   Abdominal   Peds  Hematology negative hematology ROS (+)   Anesthesia Other Findings Past Medical History:   Anxiety                                                      Dysrhythmia                                                    Comment:tachycardia  takes atenolol   Depression                                                  Past Surgical History:   CERVICAL FUSION                                                 Comment:times 2   BREAST SURGERY                                                  Comment:breast biopsy   CERVICAL CONE BIOPSY                                          TONSILLECTOMY                                                 POSTERIOR CERVICAL FUSION/FORAMINOTOMY           03/07/2011      Comment:Procedure: POSTERIOR CERVICAL               FUSION/FORAMINOTOMY LEVEL 4;  Surgeon: Camelia Eng  Ronnald Ramp, MD;  Location: [facility];  Service:               Neurosurgery;  Laterality: N/A;  cervical three           to thoracic one posterior cervical fusion    BREAST BIOPSY                                   Left                Comment:core- neg   BACK SURGERY                                                 BMI    Body Mass Index   22.30 kg/m 2      Reproductive/Obstetrics negative OB ROS                             Anesthesia Physical Anesthesia Plan  ASA: III  Anesthesia Plan: General   Post-op Pain Management:    Induction:   Airway Management Planned:   Additional Equipment:   Intra-op Plan:   Post-operative Plan:   Informed Consent: I have reviewed the patients History and Physical, chart, labs and discussed the procedure including the risks, benefits and alternatives for the proposed anesthesia with the patient or authorized representative who has indicated his/her understanding and acceptance.   Dental Advisory Given  Plan Discussed with: Anesthesiologist, CRNA and Surgeon  Anesthesia Plan Comments: (Requested that patient remove contact lenses to prevent corneal abrasion  )        Anesthesia Quick Evaluation

## 2015-04-19 ENCOUNTER — Encounter: Payer: Self-pay | Admitting: Gastroenterology

## 2015-04-20 LAB — SURGICAL PATHOLOGY

## 2015-05-16 DIAGNOSIS — R197 Diarrhea, unspecified: Secondary | ICD-10-CM | POA: Diagnosis not present

## 2015-05-16 DIAGNOSIS — R110 Nausea: Secondary | ICD-10-CM | POA: Diagnosis not present

## 2015-05-16 DIAGNOSIS — R109 Unspecified abdominal pain: Secondary | ICD-10-CM | POA: Diagnosis not present

## 2015-05-16 DIAGNOSIS — Z1211 Encounter for screening for malignant neoplasm of colon: Secondary | ICD-10-CM | POA: Diagnosis not present

## 2015-06-08 DIAGNOSIS — G5621 Lesion of ulnar nerve, right upper limb: Secondary | ICD-10-CM | POA: Diagnosis not present

## 2015-06-08 DIAGNOSIS — G959 Disease of spinal cord, unspecified: Secondary | ICD-10-CM | POA: Diagnosis not present

## 2015-06-22 DIAGNOSIS — K635 Polyp of colon: Secondary | ICD-10-CM | POA: Diagnosis not present

## 2015-07-25 DIAGNOSIS — Z981 Arthrodesis status: Secondary | ICD-10-CM | POA: Diagnosis not present

## 2015-07-25 DIAGNOSIS — D122 Benign neoplasm of ascending colon: Secondary | ICD-10-CM | POA: Diagnosis not present

## 2015-07-25 DIAGNOSIS — K635 Polyp of colon: Secondary | ICD-10-CM | POA: Diagnosis not present

## 2015-07-25 DIAGNOSIS — F329 Major depressive disorder, single episode, unspecified: Secondary | ICD-10-CM | POA: Diagnosis not present

## 2015-07-25 DIAGNOSIS — D126 Benign neoplasm of colon, unspecified: Secondary | ICD-10-CM | POA: Diagnosis not present

## 2015-07-25 DIAGNOSIS — F17210 Nicotine dependence, cigarettes, uncomplicated: Secondary | ICD-10-CM | POA: Diagnosis not present

## 2015-09-06 DIAGNOSIS — M961 Postlaminectomy syndrome, not elsewhere classified: Secondary | ICD-10-CM | POA: Insufficient documentation

## 2015-09-06 DIAGNOSIS — G959 Disease of spinal cord, unspecified: Secondary | ICD-10-CM | POA: Diagnosis not present

## 2015-09-14 DIAGNOSIS — F4542 Pain disorder with related psychological factors: Secondary | ICD-10-CM | POA: Diagnosis not present

## 2015-09-29 DIAGNOSIS — R202 Paresthesia of skin: Secondary | ICD-10-CM | POA: Diagnosis not present

## 2015-09-29 DIAGNOSIS — G959 Disease of spinal cord, unspecified: Secondary | ICD-10-CM | POA: Diagnosis not present

## 2015-09-29 DIAGNOSIS — G5621 Lesion of ulnar nerve, right upper limb: Secondary | ICD-10-CM | POA: Diagnosis not present

## 2015-09-29 DIAGNOSIS — M961 Postlaminectomy syndrome, not elsewhere classified: Secondary | ICD-10-CM | POA: Diagnosis not present

## 2015-10-06 DIAGNOSIS — H2513 Age-related nuclear cataract, bilateral: Secondary | ICD-10-CM | POA: Diagnosis not present

## 2015-12-07 DIAGNOSIS — M961 Postlaminectomy syndrome, not elsewhere classified: Secondary | ICD-10-CM | POA: Diagnosis not present

## 2015-12-07 DIAGNOSIS — G5621 Lesion of ulnar nerve, right upper limb: Secondary | ICD-10-CM | POA: Diagnosis not present

## 2016-01-13 ENCOUNTER — Other Ambulatory Visit: Payer: Self-pay | Admitting: Internal Medicine

## 2016-01-20 ENCOUNTER — Other Ambulatory Visit: Payer: Self-pay | Admitting: Internal Medicine

## 2016-01-20 DIAGNOSIS — R109 Unspecified abdominal pain: Secondary | ICD-10-CM | POA: Diagnosis not present

## 2016-01-20 DIAGNOSIS — R110 Nausea: Secondary | ICD-10-CM | POA: Diagnosis not present

## 2016-01-20 DIAGNOSIS — N63 Unspecified lump in unspecified breast: Secondary | ICD-10-CM | POA: Diagnosis not present

## 2016-01-20 DIAGNOSIS — E559 Vitamin D deficiency, unspecified: Secondary | ICD-10-CM | POA: Diagnosis not present

## 2016-01-20 DIAGNOSIS — R928 Other abnormal and inconclusive findings on diagnostic imaging of breast: Secondary | ICD-10-CM

## 2016-01-20 DIAGNOSIS — R197 Diarrhea, unspecified: Secondary | ICD-10-CM | POA: Diagnosis not present

## 2016-01-20 DIAGNOSIS — Z13818 Encounter for screening for other digestive system disorders: Secondary | ICD-10-CM | POA: Diagnosis not present

## 2016-01-20 DIAGNOSIS — Z1329 Encounter for screening for other suspected endocrine disorder: Secondary | ICD-10-CM | POA: Diagnosis not present

## 2016-01-20 LAB — HM HEPATITIS C SCREENING LAB: HM HEPATITIS C SCREENING: NEGATIVE

## 2016-01-23 ENCOUNTER — Other Ambulatory Visit: Payer: Self-pay | Admitting: Internal Medicine

## 2016-01-23 DIAGNOSIS — R928 Other abnormal and inconclusive findings on diagnostic imaging of breast: Secondary | ICD-10-CM

## 2016-01-29 ENCOUNTER — Emergency Department (HOSPITAL_COMMUNITY): Payer: PPO

## 2016-01-29 ENCOUNTER — Encounter (HOSPITAL_COMMUNITY): Payer: Self-pay | Admitting: *Deleted

## 2016-01-29 ENCOUNTER — Inpatient Hospital Stay (HOSPITAL_COMMUNITY)
Admission: EM | Admit: 2016-01-29 | Discharge: 2016-02-07 | DRG: 964 | Disposition: A | Discharge status: Home Health | Payer: PPO | Attending: General Surgery | Admitting: General Surgery

## 2016-01-29 DIAGNOSIS — M542 Cervicalgia: Secondary | ICD-10-CM | POA: Diagnosis not present

## 2016-01-29 DIAGNOSIS — S2241XA Multiple fractures of ribs, right side, initial encounter for closed fracture: Secondary | ICD-10-CM | POA: Diagnosis present

## 2016-01-29 DIAGNOSIS — F10129 Alcohol abuse with intoxication, unspecified: Secondary | ICD-10-CM | POA: Diagnosis present

## 2016-01-29 DIAGNOSIS — D72829 Elevated white blood cell count, unspecified: Secondary | ICD-10-CM

## 2016-01-29 DIAGNOSIS — Z9889 Other specified postprocedural states: Secondary | ICD-10-CM

## 2016-01-29 DIAGNOSIS — F1090 Alcohol use, unspecified, uncomplicated: Secondary | ICD-10-CM | POA: Diagnosis present

## 2016-01-29 DIAGNOSIS — S32008A Other fracture of unspecified lumbar vertebra, initial encounter for closed fracture: Secondary | ICD-10-CM | POA: Diagnosis not present

## 2016-01-29 DIAGNOSIS — S32009A Unspecified fracture of unspecified lumbar vertebra, initial encounter for closed fracture: Secondary | ICD-10-CM

## 2016-01-29 DIAGNOSIS — D62 Acute posthemorrhagic anemia: Secondary | ICD-10-CM | POA: Diagnosis not present

## 2016-01-29 DIAGNOSIS — J90 Pleural effusion, not elsewhere classified: Secondary | ICD-10-CM | POA: Diagnosis not present

## 2016-01-29 DIAGNOSIS — T797XXA Traumatic subcutaneous emphysema, initial encounter: Secondary | ICD-10-CM | POA: Diagnosis not present

## 2016-01-29 DIAGNOSIS — R0902 Hypoxemia: Secondary | ICD-10-CM | POA: Diagnosis not present

## 2016-01-29 DIAGNOSIS — Y92410 Unspecified street and highway as the place of occurrence of the external cause: Secondary | ICD-10-CM

## 2016-01-29 DIAGNOSIS — R0602 Shortness of breath: Secondary | ICD-10-CM | POA: Diagnosis not present

## 2016-01-29 DIAGNOSIS — Z23 Encounter for immunization: Secondary | ICD-10-CM | POA: Diagnosis not present

## 2016-01-29 DIAGNOSIS — S32592A Other specified fracture of left pubis, initial encounter for closed fracture: Secondary | ICD-10-CM | POA: Diagnosis present

## 2016-01-29 DIAGNOSIS — S27321A Contusion of lung, unilateral, initial encounter: Secondary | ICD-10-CM | POA: Diagnosis not present

## 2016-01-29 DIAGNOSIS — R935 Abnormal findings on diagnostic imaging of other abdominal regions, including retroperitoneum: Secondary | ICD-10-CM | POA: Diagnosis not present

## 2016-01-29 DIAGNOSIS — S3993XA Unspecified injury of pelvis, initial encounter: Secondary | ICD-10-CM | POA: Diagnosis not present

## 2016-01-29 DIAGNOSIS — B370 Candidal stomatitis: Secondary | ICD-10-CM | POA: Diagnosis not present

## 2016-01-29 DIAGNOSIS — S199XXA Unspecified injury of neck, initial encounter: Secondary | ICD-10-CM | POA: Diagnosis not present

## 2016-01-29 DIAGNOSIS — E876 Hypokalemia: Secondary | ICD-10-CM | POA: Diagnosis present

## 2016-01-29 DIAGNOSIS — IMO0002 Reserved for concepts with insufficient information to code with codable children: Secondary | ICD-10-CM

## 2016-01-29 DIAGNOSIS — Y908 Blood alcohol level of 240 mg/100 ml or more: Secondary | ICD-10-CM | POA: Diagnosis not present

## 2016-01-29 DIAGNOSIS — Z789 Other specified health status: Secondary | ICD-10-CM | POA: Diagnosis present

## 2016-01-29 DIAGNOSIS — Z79899 Other long term (current) drug therapy: Secondary | ICD-10-CM | POA: Diagnosis not present

## 2016-01-29 DIAGNOSIS — S32591A Other specified fracture of right pubis, initial encounter for closed fracture: Secondary | ICD-10-CM | POA: Diagnosis not present

## 2016-01-29 DIAGNOSIS — S32029A Unspecified fracture of second lumbar vertebra, initial encounter for closed fracture: Secondary | ICD-10-CM | POA: Diagnosis present

## 2016-01-29 DIAGNOSIS — S3282XA Multiple fractures of pelvis without disruption of pelvic ring, initial encounter for closed fracture: Secondary | ICD-10-CM | POA: Diagnosis present

## 2016-01-29 DIAGNOSIS — S2249XA Multiple fractures of ribs, unspecified side, initial encounter for closed fracture: Secondary | ICD-10-CM

## 2016-01-29 DIAGNOSIS — E872 Acidosis: Secondary | ICD-10-CM | POA: Diagnosis not present

## 2016-01-29 DIAGNOSIS — M549 Dorsalgia, unspecified: Secondary | ICD-10-CM | POA: Diagnosis not present

## 2016-01-29 DIAGNOSIS — F17200 Nicotine dependence, unspecified, uncomplicated: Secondary | ICD-10-CM | POA: Diagnosis present

## 2016-01-29 DIAGNOSIS — I959 Hypotension, unspecified: Secondary | ICD-10-CM | POA: Diagnosis present

## 2016-01-29 DIAGNOSIS — S299XXA Unspecified injury of thorax, initial encounter: Secondary | ICD-10-CM | POA: Diagnosis not present

## 2016-01-29 DIAGNOSIS — R51 Headache: Secondary | ICD-10-CM | POA: Diagnosis not present

## 2016-01-29 DIAGNOSIS — Z7289 Other problems related to lifestyle: Secondary | ICD-10-CM | POA: Diagnosis present

## 2016-01-29 LAB — COMPREHENSIVE METABOLIC PANEL
ALBUMIN: 3.4 g/dL — AB (ref 3.5–5.0)
ALK PHOS: 66 U/L (ref 38–126)
ALT: 34 U/L (ref 14–54)
AST: 69 U/L — AB (ref 15–41)
Anion gap: 7 (ref 5–15)
BILIRUBIN TOTAL: 0.4 mg/dL (ref 0.3–1.2)
BUN: 7 mg/dL (ref 6–20)
CALCIUM: 8.3 mg/dL — AB (ref 8.9–10.3)
CO2: 27 mmol/L (ref 22–32)
CREATININE: 0.55 mg/dL (ref 0.44–1.00)
Chloride: 105 mmol/L (ref 101–111)
GFR calc Af Amer: >60 mL/min (ref >60)
GFR calc non Af Amer: >60 mL/min (ref >60)
GLUCOSE: 114 mg/dL — AB (ref 65–99)
Potassium: 3 mmol/L — ABNORMAL LOW (ref 3.5–5.1)
SODIUM: 139 mmol/L (ref 135–145)
TOTAL PROTEIN: 5.7 g/dL — AB (ref 6.5–8.1)

## 2016-01-29 LAB — CBC
HCT: 38 % (ref 36.0–46.0)
HEMOGLOBIN: 12.5 g/dL (ref 12.0–15.0)
MCH: 32.5 pg (ref 26.0–34.0)
MCHC: 32.9 g/dL (ref 30.0–36.0)
MCV: 98.7 fL (ref 78.0–100.0)
PLATELETS: 150 10*3/uL (ref 150–400)
RBC: 3.85 MIL/uL — ABNORMAL LOW (ref 3.87–5.11)
RDW: 13.3 % (ref 11.5–15.5)
WBC: 12.6 10*3/uL — ABNORMAL HIGH (ref 4.0–10.5)

## 2016-01-29 LAB — PROTIME-INR
INR: 1.07
PROTHROMBIN TIME: 14 s (ref 11.4–15.2)

## 2016-01-29 LAB — I-STAT CHEM 8, ED
BUN: 8 mg/dL (ref 6–20)
CALCIUM ION: 1.03 mmol/L — AB (ref 1.15–1.40)
CHLORIDE: 102 mmol/L (ref 101–111)
CREATININE: 0.9 mg/dL (ref 0.44–1.00)
GLUCOSE: 107 mg/dL — AB (ref 65–99)
HCT: 38 % (ref 36.0–46.0)
Hemoglobin: 12.9 g/dL (ref 12.0–15.0)
Potassium: 3 mmol/L — ABNORMAL LOW (ref 3.5–5.1)
Sodium: 141 mmol/L (ref 135–145)
TCO2: 26 mmol/L (ref 0–100)

## 2016-01-29 LAB — SAMPLE TO BLOOD BANK

## 2016-01-29 LAB — I-STAT CG4 LACTIC ACID, ED: Lactic Acid, Venous: 3.41 mmol/L (ref 0.5–1.9)

## 2016-01-29 LAB — ETHANOL: ALCOHOL ETHYL (B): 315 mg/dL — AB (ref <5)

## 2016-01-29 LAB — CDS SEROLOGY

## 2016-01-29 MED ORDER — FENTANYL CITRATE (PF) 100 MCG/2ML IJ SOLN
50.0000 ug | INTRAMUSCULAR | Status: DC | PRN
  Administered 2016-01-29 – 2016-01-30 (×2): 50 ug via INTRAVENOUS
  Filled 2016-01-29 (×2): qty 2

## 2016-01-29 MED ORDER — SODIUM CHLORIDE 0.9 % IV SOLN
30.0000 meq | Freq: Once | INTRAVENOUS | Status: AC
  Administered 2016-01-29: 30 meq via INTRAVENOUS
  Filled 2016-01-29: qty 15

## 2016-01-29 MED ORDER — SODIUM CHLORIDE 0.9 % IV SOLN
INTRAVENOUS | Status: DC
  Administered 2016-01-30 – 2016-02-02 (×3): via INTRAVENOUS

## 2016-01-29 MED ORDER — SODIUM CHLORIDE 0.9 % IV BOLUS (SEPSIS)
1000.0000 mL | Freq: Once | INTRAVENOUS | Status: AC
  Administered 2016-01-29: 1000 mL via INTRAVENOUS

## 2016-01-29 MED ORDER — TETANUS-DIPHTH-ACELL PERTUSSIS 5-2.5-18.5 LF-MCG/0.5 IM SUSP
0.5000 mL | Freq: Once | INTRAMUSCULAR | Status: AC
  Administered 2016-01-30: 0.5 mL via INTRAMUSCULAR
  Filled 2016-01-29: qty 0.5

## 2016-01-29 MED ORDER — IOPAMIDOL (ISOVUE-300) INJECTION 61%
INTRAVENOUS | Status: AC
  Administered 2016-01-29: 100 mL
  Filled 2016-01-29: qty 100
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
## 2016-01-30 ENCOUNTER — Inpatient Hospital Stay (HOSPITAL_COMMUNITY): Payer: PPO

## 2016-01-30 DIAGNOSIS — F17200 Nicotine dependence, unspecified, uncomplicated: Secondary | ICD-10-CM | POA: Diagnosis not present

## 2016-01-30 DIAGNOSIS — S32029A Unspecified fracture of second lumbar vertebra, initial encounter for closed fracture: Secondary | ICD-10-CM | POA: Diagnosis not present

## 2016-01-30 DIAGNOSIS — S299XXA Unspecified injury of thorax, initial encounter: Secondary | ICD-10-CM | POA: Diagnosis not present

## 2016-01-30 DIAGNOSIS — J90 Pleural effusion, not elsewhere classified: Secondary | ICD-10-CM | POA: Diagnosis not present

## 2016-01-30 DIAGNOSIS — R0902 Hypoxemia: Secondary | ICD-10-CM | POA: Diagnosis not present

## 2016-01-30 DIAGNOSIS — R0602 Shortness of breath: Secondary | ICD-10-CM | POA: Diagnosis not present

## 2016-01-30 DIAGNOSIS — Z23 Encounter for immunization: Secondary | ICD-10-CM | POA: Diagnosis not present

## 2016-01-30 DIAGNOSIS — F10129 Alcohol abuse with intoxication, unspecified: Secondary | ICD-10-CM | POA: Diagnosis not present

## 2016-01-30 DIAGNOSIS — S32512A Fracture of superior rim of left pubis, initial encounter for closed fracture: Secondary | ICD-10-CM | POA: Diagnosis not present

## 2016-01-30 DIAGNOSIS — S32592A Other specified fracture of left pubis, initial encounter for closed fracture: Secondary | ICD-10-CM | POA: Diagnosis not present

## 2016-01-30 DIAGNOSIS — S32511A Fracture of superior rim of right pubis, initial encounter for closed fracture: Secondary | ICD-10-CM | POA: Diagnosis not present

## 2016-01-30 DIAGNOSIS — E876 Hypokalemia: Secondary | ICD-10-CM | POA: Diagnosis not present

## 2016-01-30 DIAGNOSIS — B370 Candidal stomatitis: Secondary | ICD-10-CM | POA: Diagnosis not present

## 2016-01-30 DIAGNOSIS — Y908 Blood alcohol level of 240 mg/100 ml or more: Secondary | ICD-10-CM | POA: Diagnosis not present

## 2016-01-30 DIAGNOSIS — E872 Acidosis: Secondary | ICD-10-CM | POA: Diagnosis not present

## 2016-01-30 DIAGNOSIS — D62 Acute posthemorrhagic anemia: Secondary | ICD-10-CM | POA: Diagnosis not present

## 2016-01-30 DIAGNOSIS — S2241XA Multiple fractures of ribs, right side, initial encounter for closed fracture: Secondary | ICD-10-CM | POA: Diagnosis not present

## 2016-01-30 DIAGNOSIS — T797XXA Traumatic subcutaneous emphysema, initial encounter: Secondary | ICD-10-CM | POA: Diagnosis not present

## 2016-01-30 DIAGNOSIS — J449 Chronic obstructive pulmonary disease, unspecified: Secondary | ICD-10-CM | POA: Diagnosis not present

## 2016-01-30 DIAGNOSIS — S27321A Contusion of lung, unilateral, initial encounter: Secondary | ICD-10-CM | POA: Diagnosis not present

## 2016-01-30 DIAGNOSIS — Z79899 Other long term (current) drug therapy: Secondary | ICD-10-CM | POA: Diagnosis not present

## 2016-01-30 DIAGNOSIS — S329XXA Fracture of unspecified parts of lumbosacral spine and pelvis, initial encounter for closed fracture: Secondary | ICD-10-CM | POA: Diagnosis not present

## 2016-01-30 DIAGNOSIS — Y92410 Unspecified street and highway as the place of occurrence of the external cause: Secondary | ICD-10-CM | POA: Diagnosis not present

## 2016-01-30 DIAGNOSIS — S32591A Other specified fracture of right pubis, initial encounter for closed fracture: Secondary | ICD-10-CM | POA: Diagnosis not present

## 2016-01-30 DIAGNOSIS — I959 Hypotension, unspecified: Secondary | ICD-10-CM | POA: Diagnosis not present

## 2016-01-30 LAB — BASIC METABOLIC PANEL
ANION GAP: 9 (ref 5–15)
BUN: 5 mg/dL — ABNORMAL LOW (ref 6–20)
CHLORIDE: 112 mmol/L — AB (ref 101–111)
CO2: 21 mmol/L — AB (ref 22–32)
CREATININE: 0.38 mg/dL — AB (ref 0.44–1.00)
Calcium: 7.5 mg/dL — ABNORMAL LOW (ref 8.9–10.3)
GFR calc non Af Amer: >60 mL/min (ref >60)
Glucose, Bld: 121 mg/dL — ABNORMAL HIGH (ref 65–99)
POTASSIUM: 3.5 mmol/L (ref 3.5–5.1)
SODIUM: 142 mmol/L (ref 135–145)

## 2016-01-30 LAB — URINALYSIS, ROUTINE W REFLEX MICROSCOPIC
BACTERIA UA: NONE SEEN
BILIRUBIN URINE: NEGATIVE
Glucose, UA: NEGATIVE mg/dL
Ketones, ur: NEGATIVE mg/dL
LEUKOCYTES UA: NEGATIVE
NITRITE: NEGATIVE
PROTEIN: NEGATIVE mg/dL
SPECIFIC GRAVITY, URINE: 1.041 — AB (ref 1.005–1.030)
pH: 5 (ref 5.0–8.0)

## 2016-01-30 LAB — CBC
HEMATOCRIT: 30.4 % — AB (ref 36.0–46.0)
HEMOGLOBIN: 9.8 g/dL — AB (ref 12.0–15.0)
MCH: 31.4 pg (ref 26.0–34.0)
MCHC: 32.2 g/dL (ref 30.0–36.0)
MCV: 97.4 fL (ref 78.0–100.0)
Platelets: 125 10*3/uL — ABNORMAL LOW (ref 150–400)
RBC: 3.12 MIL/uL — AB (ref 3.87–5.11)
RDW: 13.3 % (ref 11.5–15.5)
WBC: 6.3 10*3/uL (ref 4.0–10.5)

## 2016-01-30 LAB — RAPID URINE DRUG SCREEN, HOSP PERFORMED
Amphetamines: NOT DETECTED
BARBITURATES: NOT DETECTED
Benzodiazepines: POSITIVE — AB
COCAINE: NOT DETECTED
Opiates: NOT DETECTED
TETRAHYDROCANNABINOL: POSITIVE — AB

## 2016-01-30 LAB — GLUCOSE, CAPILLARY: GLUCOSE-CAPILLARY: 128 mg/dL — AB (ref 65–99)

## 2016-01-30 LAB — MRSA PCR SCREENING: MRSA BY PCR: NEGATIVE

## 2016-01-30 MED ORDER — ACETAMINOPHEN 325 MG PO TABS
650.0000 mg | ORAL_TABLET | Freq: Four times a day (QID) | ORAL | Status: DC
  Administered 2016-01-30 – 2016-02-07 (×31): 650 mg via ORAL
  Filled 2016-01-30 (×31): qty 2

## 2016-01-30 MED ORDER — HYDROMORPHONE HCL 1 MG/ML IJ SOLN
0.5000 mg | INTRAMUSCULAR | Status: DC | PRN
  Administered 2016-01-30 – 2016-01-31 (×12): 1 mg via INTRAVENOUS
  Filled 2016-01-30 (×12): qty 1

## 2016-01-30 MED ORDER — ACETAMINOPHEN 325 MG PO TABS: 650.0000 mg | ORAL_TABLET | ORAL | Status: DC | PRN

## 2016-01-30 MED ORDER — POTASSIUM CHLORIDE 20 MEQ/15ML (10%) PO SOLN: 40.0000 meq | ORAL | Status: DC

## 2016-01-30 MED ORDER — PHENYLEPHRINE HCL 10 MG PO TABS: 10.0000 mg | ORAL_TABLET | ORAL | Status: DC | PRN

## 2016-01-30 MED ORDER — CITALOPRAM HYDROBROMIDE 40 MG PO TABS
40.0000 mg | ORAL_TABLET | Freq: Every day | ORAL | Status: DC
  Administered 2016-01-30 – 2016-02-07 (×9): 40 mg via ORAL
  Filled 2016-01-30: qty 1
  Filled 2016-01-30: qty 2
  Filled 2016-01-30 (×2): qty 1
  Filled 2016-01-30: qty 2
  Filled 2016-01-30 (×2): qty 1
  Filled 2016-01-30: qty 2
  Filled 2016-01-30: qty 1
  Filled 2016-01-30: qty 2
  Filled 2016-01-30: qty 1
  Filled 2016-01-30 (×2): qty 2
  Filled 2016-01-30 (×2): qty 1
  Filled 2016-01-30: qty 2

## 2016-01-30 MED ORDER — PANTOPRAZOLE SODIUM 40 MG PO TBEC
40.0000 mg | DELAYED_RELEASE_TABLET | Freq: Every day | ORAL | Status: DC
  Administered 2016-01-31 – 2016-02-07 (×8): 40 mg via ORAL
  Filled 2016-01-30 (×8): qty 1

## 2016-01-30 MED ORDER — FENTANYL CITRATE (PF) 100 MCG/2ML IJ SOLN
INTRAMUSCULAR | Status: AC | PRN
  Administered 2016-01-29: 50 ug via INTRAVENOUS

## 2016-01-30 MED ORDER — HYDROMORPHONE HCL 2 MG/ML IJ SOLN
1.0000 mg | Freq: Once | INTRAMUSCULAR | Status: AC
  Administered 2016-01-30: 1 mg via INTRAVENOUS
  Filled 2016-01-30: qty 1

## 2016-01-30 MED ORDER — FOLIC ACID 5 MG/ML IJ SOLN
1.0000 mg | Freq: Every day | INTRAMUSCULAR | Status: DC
  Administered 2016-01-31: 1 mg via INTRAVENOUS
  Filled 2016-01-30 (×2): qty 0.2

## 2016-01-30 MED ORDER — ONDANSETRON HCL 4 MG/2ML IJ SOLN: 4.0000 mg | Freq: Four times a day (QID) | INTRAMUSCULAR | Status: DC | PRN

## 2016-01-30 MED ORDER — OXYCODONE HCL 5 MG PO TABS: 5.0000 mg | ORAL_TABLET | ORAL | Status: DC | PRN

## 2016-01-30 MED ORDER — TRAZODONE HCL 50 MG PO TABS
50.0000 mg | ORAL_TABLET | Freq: Every evening | ORAL | Status: DC | PRN
  Administered 2016-01-31 – 2016-02-06 (×6): 50 mg via ORAL
  Filled 2016-01-30 (×7): qty 1

## 2016-01-30 MED ORDER — PANTOPRAZOLE SODIUM 40 MG IV SOLR
40.0000 mg | Freq: Every day | INTRAVENOUS | Status: DC
  Administered 2016-01-30: 40 mg via INTRAVENOUS
  Filled 2016-01-30: qty 40

## 2016-01-30 MED ORDER — DOCUSATE SODIUM 100 MG PO CAPS
100.0000 mg | ORAL_CAPSULE | Freq: Two times a day (BID) | ORAL | Status: DC
  Administered 2016-01-30 – 2016-02-06 (×13): 100 mg via ORAL
  Filled 2016-01-30 (×16): qty 1

## 2016-01-30 MED ORDER — KCL IN DEXTROSE-NACL 20-5-0.45 MEQ/L-%-% IV SOLN
INTRAVENOUS | Status: DC
  Administered 2016-01-30 – 2016-01-31 (×3): via INTRAVENOUS
  Filled 2016-01-30 (×4): qty 1000

## 2016-01-30 MED ORDER — ONDANSETRON HCL 4 MG PO TABS: 4.0000 mg | ORAL_TABLET | Freq: Four times a day (QID) | ORAL | Status: DC | PRN

## 2016-01-30 MED ORDER — DIAZEPAM 5 MG PO TABS
5.0000 mg | ORAL_TABLET | Freq: Three times a day (TID) | ORAL | Status: DC | PRN
  Administered 2016-01-31 – 2016-02-07 (×5): 5 mg via ORAL
  Filled 2016-01-30 (×5): qty 1

## 2016-01-30 MED ORDER — GABAPENTIN 400 MG PO CAPS
400.0000 mg | ORAL_CAPSULE | Freq: Three times a day (TID) | ORAL | Status: DC
  Administered 2016-01-30 – 2016-02-07 (×25): 400 mg via ORAL
  Filled 2016-01-30 (×25): qty 1

## 2016-01-30 MED ORDER — FLUTICASONE PROPIONATE 50 MCG/ACT NA SUSP
1.0000 | Freq: Every day | NASAL | Status: DC | PRN
  Filled 2016-01-30: qty 16

## 2016-01-30 MED ORDER — BACITRACIN ZINC 500 UNIT/GM EX OINT
TOPICAL_OINTMENT | Freq: Two times a day (BID) | CUTANEOUS | Status: DC
  Administered 2016-01-30 (×2): via TOPICAL
  Administered 2016-01-31: 1 via TOPICAL
  Administered 2016-01-31 – 2016-02-07 (×15): via TOPICAL
  Filled 2016-01-30 (×3): qty 28.35

## 2016-01-30 MED ORDER — METHOCARBAMOL 750 MG PO TABS
750.0000 mg | ORAL_TABLET | Freq: Three times a day (TID) | ORAL | Status: DC
  Administered 2016-01-30 – 2016-01-31 (×5): 750 mg via ORAL
  Filled 2016-01-30 (×5): qty 2

## 2016-01-30 MED ORDER — ENOXAPARIN SODIUM 40 MG/0.4ML ~~LOC~~ SOLN
40.0000 mg | SUBCUTANEOUS | Status: DC
  Administered 2016-01-30 – 2016-02-06 (×8): 40 mg via SUBCUTANEOUS
  Filled 2016-01-30 (×8): qty 0.4

## 2016-01-30 MED ORDER — THIAMINE HCL 100 MG/ML IJ SOLN
100.0000 mg | Freq: Every day | INTRAMUSCULAR | Status: DC
  Administered 2016-01-30 – 2016-01-31 (×2): 100 mg via INTRAVENOUS
  Filled 2016-01-30 (×2): qty 2

## 2016-01-30 NOTE — ED Notes (Signed)
Sats drop when patient falls asleep - oxygen applied via [place] 2 liters

## 2016-01-30 NOTE — ED Notes (Signed)
Incentive Spirometer given to patient.  Instructions given and patient demonstrated back.
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
## 2016-01-30 NOTE — Progress Notes (Signed)
Report received from Nicholas Lose, RN. Awaiting arrival to SICU.
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
## 2016-01-31 ENCOUNTER — Inpatient Hospital Stay (HOSPITAL_COMMUNITY): Payer: PPO

## 2016-01-31 LAB — BASIC METABOLIC PANEL
ANION GAP: 5 (ref 5–15)
CO2: 25 mmol/L (ref 22–32)
Calcium: 7.7 mg/dL — ABNORMAL LOW (ref 8.9–10.3)
Chloride: 108 mmol/L (ref 101–111)
Creatinine, Ser: 0.37 mg/dL — ABNORMAL LOW (ref 0.44–1.00)
GFR calc Af Amer: >60 mL/min (ref >60)
GFR calc non Af Amer: >60 mL/min (ref >60)
GLUCOSE: 128 mg/dL — AB (ref 65–99)
POTASSIUM: 3.4 mmol/L — AB (ref 3.5–5.1)
Sodium: 138 mmol/L (ref 135–145)

## 2016-01-31 LAB — CBC WITH DIFFERENTIAL/PLATELET
Basophils Absolute: 0 10*3/uL (ref 0.0–0.1)
Basophils Relative: 0 %
Eosinophils Absolute: 0 10*3/uL (ref 0.0–0.7)
Eosinophils Relative: 0 %
HCT: 29.8 % — ABNORMAL LOW (ref 36.0–46.0)
HEMOGLOBIN: 9.7 g/dL — AB (ref 12.0–15.0)
LYMPHS ABS: 1.7 10*3/uL (ref 0.7–4.0)
LYMPHS PCT: 20 %
MCH: 32 pg (ref 26.0–34.0)
MCHC: 32.6 g/dL (ref 30.0–36.0)
MCV: 98.3 fL (ref 78.0–100.0)
Monocytes Absolute: 0.5 10*3/uL (ref 0.1–1.0)
Monocytes Relative: 6 %
NEUTROS ABS: 6.3 10*3/uL (ref 1.7–7.7)
NEUTROS PCT: 74 %
Platelets: 110 10*3/uL — ABNORMAL LOW (ref 150–400)
RBC: 3.03 MIL/uL — AB (ref 3.87–5.11)
RDW: 13.4 % (ref 11.5–15.5)
WBC: 8.5 10*3/uL (ref 4.0–10.5)

## 2016-01-31 MED ORDER — OXYCODONE HCL 5 MG PO TABS
5.0000 mg | ORAL_TABLET | ORAL | Status: DC | PRN
  Administered 2016-01-31: 20 mg via ORAL
  Administered 2016-01-31: 10 mg via ORAL
  Administered 2016-02-01 – 2016-02-07 (×28): 20 mg via ORAL
  Filled 2016-01-31 (×13): qty 4
  Filled 2016-01-31: qty 2
  Filled 2016-01-31 (×16): qty 4

## 2016-01-31 MED ORDER — HYDROMORPHONE HCL 2 MG/ML IJ SOLN
0.5000 mg | INTRAMUSCULAR | Status: DC | PRN
  Administered 2016-01-31 – 2016-02-01 (×5): 1 mg via INTRAVENOUS
  Administered 2016-02-02: 2 mg via INTRAVENOUS
  Administered 2016-02-02: 1 mg via INTRAVENOUS
  Administered 2016-02-03 (×2): 2 mg via INTRAVENOUS
  Filled 2016-01-31 (×9): qty 1

## 2016-01-31 MED ORDER — VITAMIN B-1 100 MG PO TABS
100.0000 mg | ORAL_TABLET | Freq: Every day | ORAL | Status: DC
  Administered 2016-02-01 – 2016-02-07 (×7): 100 mg via ORAL
  Filled 2016-01-31 (×7): qty 1

## 2016-01-31 MED ORDER — KCL IN DEXTROSE-NACL 20-5-0.45 MEQ/L-%-% IV SOLN
INTRAVENOUS | Status: DC
  Administered 2016-02-01: 17:00:00 via INTRAVENOUS
  Filled 2016-01-31: qty 1000

## 2016-01-31 MED ORDER — FOLIC ACID 1 MG PO TABS
1.0000 mg | ORAL_TABLET | Freq: Every day | ORAL | Status: DC
  Administered 2016-02-01 – 2016-02-07 (×7): 1 mg via ORAL
  Filled 2016-01-31 (×7): qty 1
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
## 2016-01-31 NOTE — Progress Notes (Signed)
Plan to move forward with trial of nonop management of the pelvic ring fractures.  WBAT BLE.  Follow up after dc in 2 weeks.   Nicholes Stairs

## 2016-01-31 NOTE — Progress Notes (Addendum)
Pt transferred to 6N27 with belongings. Report given to receiving RN and all questions answered. VSS during transfer.  Pt assisted to bed in new room. Receiving RN and NT at bedside.  Family updated on patient's location per patient.
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
## 2016-02-01 NOTE — Progress Notes (Signed)
SATURATION QUALIFICATIONS: (This note is used to comply with regulatory documentation for home oxygen)  Patient Saturations on Room Air at Rest = 95%  Patient Saturations on Room Air while Ambulating = 71-82%  Patient Saturations on 2 Liters of oxygen while Ambulating = 92%  Please briefly explain why patient needs home oxygen: Pt desats without oxygen while ambulating  Tonia Brooms, SPT [phone]

## 2016-02-02 ENCOUNTER — Inpatient Hospital Stay (HOSPITAL_COMMUNITY): Payer: PPO

## 2016-02-02 LAB — URINALYSIS, ROUTINE W REFLEX MICROSCOPIC
Bilirubin Urine: NEGATIVE
Glucose, UA: NEGATIVE mg/dL
Hgb urine dipstick: NEGATIVE
KETONES UR: NEGATIVE mg/dL
Leukocytes, UA: NEGATIVE
Nitrite: NEGATIVE
PH: 5 (ref 5.0–8.0)
Protein, ur: 30 mg/dL — AB
Specific Gravity, Urine: 1.019 (ref 1.005–1.030)

## 2016-02-02 LAB — CBC
HCT: 28.2 % — ABNORMAL LOW (ref 36.0–46.0)
Hemoglobin: 9.2 g/dL — ABNORMAL LOW (ref 12.0–15.0)
MCH: 31.8 pg (ref 26.0–34.0)
MCHC: 32.6 g/dL (ref 30.0–36.0)
MCV: 97.6 fL (ref 78.0–100.0)
PLATELETS: 183 10*3/uL (ref 150–400)
RBC: 2.89 MIL/uL — ABNORMAL LOW (ref 3.87–5.11)
RDW: 12.7 % (ref 11.5–15.5)
WBC: 12.3 10*3/uL — ABNORMAL HIGH (ref 4.0–10.5)

## 2016-02-02 MED ORDER — LEVOFLOXACIN 750 MG PO TABS
750.0000 mg | ORAL_TABLET | Freq: Every day | ORAL | Status: AC
  Administered 2016-02-02 – 2016-02-06 (×5): 750 mg via ORAL
  Filled 2016-02-02 (×5): qty 1
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
## 2016-02-02 NOTE — Progress Notes (Signed)
Brief progress:  CXR significant for persistent consolidation - pneumonia vs atelectasis. Will start levofloxacin PO empirically for possibla. PNA.  Obie Dredge, PA-C [facility] Pager: [phone] Consults: [phone] Mon-Fri 7:00 am-4:30 pm Sat-Sun 7:00 am-11:30 am

## 2016-02-03 LAB — CBC
HCT: 26.2 % — ABNORMAL LOW (ref 36.0–46.0)
Hemoglobin: 8.6 g/dL — ABNORMAL LOW (ref 12.0–15.0)
MCH: 32 pg (ref 26.0–34.0)
MCHC: 32.8 g/dL (ref 30.0–36.0)
MCV: 97.4 fL (ref 78.0–100.0)
PLATELETS: 222 10*3/uL (ref 150–400)
RBC: 2.69 MIL/uL — ABNORMAL LOW (ref 3.87–5.11)
RDW: 12.8 % (ref 11.5–15.5)
WBC: 11.7 10*3/uL — AB (ref 4.0–10.5)

## 2016-02-03 MED ORDER — FLUCONAZOLE 100 MG PO TABS
100.0000 mg | ORAL_TABLET | Freq: Every day | ORAL | Status: DC
  Administered 2016-02-04 – 2016-02-07 (×4): 100 mg via ORAL
  Filled 2016-02-03 (×4): qty 1

## 2016-02-03 MED ORDER — HYDROMORPHONE HCL 2 MG/ML IJ SOLN
0.5000 mg | INTRAMUSCULAR | Status: DC | PRN
  Administered 2016-02-03 – 2016-02-07 (×15): 0.5 mg via INTRAVENOUS
  Filled 2016-02-03 (×17): qty 1

## 2016-02-03 MED ORDER — FLUCONAZOLE 100 MG PO TABS
200.0000 mg | ORAL_TABLET | Freq: Once | ORAL | Status: AC
  Administered 2016-02-03: 200 mg via ORAL
  Filled 2016-02-03: qty 2

## 2016-02-03 NOTE — Care Management Note (Addendum)
Case Management Note  Patient Details  Name: Ashley Cross MRN: [id number] Date of Birth: [birth year]  Subjective/Objective:  Pt for likely discharge over the weekend.  Pt continuing to have desaturation with ambulation.  Pt does not qualify for insurance reimbursement of home oxygen, as she has no chronic respiratory diagnosis.  PT recommending home therapy, DME.                   Action/Plan: Referral to [facility] for home health PT and DME.  Start of care 24-48h post dc date.  Pt will discharge when room air sats WNL with ambulation.  Will follow.    Expected Discharge Date:                  Expected Discharge Plan:  [place]  In-House Referral:     Discharge planning Services  CM Consult  Post Acute Care Choice:  Home Health Choice offered to:  Patient  DME Arranged:  3-N-1[facility]ile DME Agency:  [place] Arranged:  PT [facility] Agency:  [place]  Status of Service:  Completed, signed off  If discussed at [place] of Stay Meetings, dates discussed:    Additional Comments:  Reinaldo Raddle, RN, BSN  Trauma/Neuro ICU Case Manager [phone]
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
## 2016-02-03 NOTE — Care Management Important Message (Signed)
Important Message  Patient Details  Name: Ashley Cross MRN: [id number] Date of Birth: 09/08/1955   Medicare Important Message Given:  Yes    Orbie Pyo 02/03/2016, 3:22 PM
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
## 2016-02-04 ENCOUNTER — Inpatient Hospital Stay (HOSPITAL_COMMUNITY): Payer: PPO

## 2016-02-04 ENCOUNTER — Encounter (HOSPITAL_COMMUNITY): Payer: Self-pay | Admitting: Radiology

## 2016-02-04 MED ORDER — IOPAMIDOL (ISOVUE-370) INJECTION 76%
INTRAVENOUS | Status: AC
  Administered 2016-02-04: 70 mL
  Filled 2016-02-04: qty 100
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
## 2016-02-04 NOTE — Progress Notes (Signed)
SATURATION QUALIFICATIONS: (This note is used to comply with regulatory documentation for home oxygen)  Patient Saturations on Room Air at Rest = 84%  Patient Saturations on Room Air while Ambulating = 70%  Patient Saturations on 3 Liters of oxygen while Ambulating = 94%  Please briefly explain why patient needs home oxygen:

## 2016-02-05 ENCOUNTER — Inpatient Hospital Stay (HOSPITAL_COMMUNITY): Payer: PPO

## 2016-02-05 LAB — BASIC METABOLIC PANEL
Anion gap: 8 (ref 5–15)
BUN: 5 mg/dL — AB (ref 6–20)
CALCIUM: 8.4 mg/dL — AB (ref 8.9–10.3)
CO2: 32 mmol/L (ref 22–32)
CREATININE: 0.45 mg/dL (ref 0.44–1.00)
Chloride: 101 mmol/L (ref 101–111)
GFR calc Af Amer: >60 mL/min (ref >60)
Glucose, Bld: 111 mg/dL — ABNORMAL HIGH (ref 65–99)
Potassium: 2.8 mmol/L — ABNORMAL LOW (ref 3.5–5.1)
Sodium: 141 mmol/L (ref 135–145)

## 2016-02-05 LAB — GRAM STAIN

## 2016-02-05 LAB — CBC
HCT: 24.3 % — ABNORMAL LOW (ref 36.0–46.0)
Hemoglobin: 7.9 g/dL — ABNORMAL LOW (ref 12.0–15.0)
MCH: 31.3 pg (ref 26.0–34.0)
MCHC: 32.5 g/dL (ref 30.0–36.0)
MCV: 96.4 fL (ref 78.0–100.0)
PLATELETS: 379 10*3/uL (ref 150–400)
RBC: 2.52 MIL/uL — AB (ref 3.87–5.11)
RDW: 12.8 % (ref 11.5–15.5)
WBC: 8.6 10*3/uL (ref 4.0–10.5)

## 2016-02-05 LAB — LACTATE DEHYDROGENASE, PLEURAL OR PERITONEAL FLUID: LD, Fluid: 337 U/L — ABNORMAL HIGH (ref 3–23)

## 2016-02-05 LAB — MAGNESIUM: MAGNESIUM: 2 mg/dL (ref 1.7–2.4)

## 2016-02-05 MED ORDER — LIDOCAINE HCL (PF) 1 % IJ SOLN
INTRAMUSCULAR | Status: AC
  Filled 2016-02-05: qty 10

## 2016-02-05 MED ORDER — POTASSIUM CHLORIDE CRYS ER 20 MEQ PO TBCR
20.0000 meq | EXTENDED_RELEASE_TABLET | Freq: Three times a day (TID) | ORAL | Status: DC
  Administered 2016-02-05 – 2016-02-07 (×10): 20 meq via ORAL
  Filled 2016-02-05 (×10): qty 1

## 2016-02-05 NOTE — Procedures (Signed)
Ultrasound-guided diagnostic and therapeutic right thoracentesis performed yielding 520 cc of bloody fluid. No immediate complications. Follow-up chest x-ray pending. A portion of the fluid was sent to the lab for preordered studies. [facility] PA notified of findings.
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
## 2016-02-06 DIAGNOSIS — D62 Acute posthemorrhagic anemia: Secondary | ICD-10-CM | POA: Diagnosis not present

## 2016-02-06 DIAGNOSIS — E876 Hypokalemia: Secondary | ICD-10-CM | POA: Diagnosis not present

## 2016-02-06 DIAGNOSIS — R0902 Hypoxemia: Secondary | ICD-10-CM | POA: Diagnosis not present

## 2016-02-06 DIAGNOSIS — Z79899 Other long term (current) drug therapy: Secondary | ICD-10-CM | POA: Diagnosis not present

## 2016-02-06 DIAGNOSIS — R0602 Shortness of breath: Secondary | ICD-10-CM | POA: Diagnosis not present

## 2016-02-06 DIAGNOSIS — Y92410 Unspecified street and highway as the place of occurrence of the external cause: Secondary | ICD-10-CM | POA: Diagnosis not present

## 2016-02-06 DIAGNOSIS — S32591A Other specified fracture of right pubis, initial encounter for closed fracture: Secondary | ICD-10-CM | POA: Diagnosis not present

## 2016-02-06 DIAGNOSIS — F17200 Nicotine dependence, unspecified, uncomplicated: Secondary | ICD-10-CM | POA: Diagnosis not present

## 2016-02-06 DIAGNOSIS — T797XXA Traumatic subcutaneous emphysema, initial encounter: Secondary | ICD-10-CM | POA: Diagnosis not present

## 2016-02-06 DIAGNOSIS — S2241XA Multiple fractures of ribs, right side, initial encounter for closed fracture: Secondary | ICD-10-CM | POA: Diagnosis not present

## 2016-02-06 DIAGNOSIS — J90 Pleural effusion, not elsewhere classified: Secondary | ICD-10-CM | POA: Diagnosis not present

## 2016-02-06 DIAGNOSIS — B370 Candidal stomatitis: Secondary | ICD-10-CM | POA: Diagnosis not present

## 2016-02-06 DIAGNOSIS — F10129 Alcohol abuse with intoxication, unspecified: Secondary | ICD-10-CM | POA: Diagnosis not present

## 2016-02-06 DIAGNOSIS — S27321A Contusion of lung, unilateral, initial encounter: Secondary | ICD-10-CM | POA: Diagnosis not present

## 2016-02-06 DIAGNOSIS — E872 Acidosis: Secondary | ICD-10-CM | POA: Diagnosis not present

## 2016-02-06 DIAGNOSIS — I959 Hypotension, unspecified: Secondary | ICD-10-CM | POA: Diagnosis not present

## 2016-02-06 DIAGNOSIS — S32029A Unspecified fracture of second lumbar vertebra, initial encounter for closed fracture: Secondary | ICD-10-CM | POA: Diagnosis not present

## 2016-02-06 DIAGNOSIS — Z23 Encounter for immunization: Secondary | ICD-10-CM | POA: Diagnosis not present

## 2016-02-06 DIAGNOSIS — S32592A Other specified fracture of left pubis, initial encounter for closed fracture: Secondary | ICD-10-CM | POA: Diagnosis not present

## 2016-02-06 DIAGNOSIS — Y908 Blood alcohol level of 240 mg/100 ml or more: Secondary | ICD-10-CM | POA: Diagnosis not present

## 2016-02-06 HISTORY — PX: BREAST LUMPECTOMY: SHX2

## 2016-02-06 LAB — CBC
HEMATOCRIT: 23.8 % — AB (ref 36.0–46.0)
Hemoglobin: 7.7 g/dL — ABNORMAL LOW (ref 12.0–15.0)
MCH: 31.8 pg (ref 26.0–34.0)
MCHC: 32.4 g/dL (ref 30.0–36.0)
MCV: 98.3 fL (ref 78.0–100.0)
Platelets: 420 10*3/uL — ABNORMAL HIGH (ref 150–400)
RBC: 2.42 MIL/uL — AB (ref 3.87–5.11)
RDW: 13.1 % (ref 11.5–15.5)
WBC: 8.7 10*3/uL (ref 4.0–10.5)

## 2016-02-06 LAB — BASIC METABOLIC PANEL
ANION GAP: 10 (ref 5–15)
BUN: 6 mg/dL (ref 6–20)
CHLORIDE: 102 mmol/L (ref 101–111)
CO2: 28 mmol/L (ref 22–32)
CREATININE: 0.45 mg/dL (ref 0.44–1.00)
Calcium: 8.7 mg/dL — ABNORMAL LOW (ref 8.9–10.3)
GFR calc non Af Amer: >60 mL/min (ref >60)
Glucose, Bld: 111 mg/dL — ABNORMAL HIGH (ref 65–99)
POTASSIUM: 3.8 mmol/L (ref 3.5–5.1)
SODIUM: 140 mmol/L (ref 135–145)

## 2016-02-06 MED ORDER — FUROSEMIDE 20 MG PO TABS
20.0000 mg | ORAL_TABLET | Freq: Once | ORAL | Status: AC
  Administered 2016-02-06: 20 mg via ORAL
  Filled 2016-02-06: qty 1
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
## 2016-02-06 NOTE — Progress Notes (Signed)
SATURATION QUALIFICATIONS: (This note is used to comply with regulatory documentation for home oxygen)  Patient Saturations on Room Air at Rest = 100%  Patient Saturations on Room Air while Ambulating = 89%  Patient Saturations on 2 Liters of oxygen while Ambulating = 100%  Please briefly explain why patient needs home oxygen:

## 2016-02-07 ENCOUNTER — Inpatient Hospital Stay (HOSPITAL_COMMUNITY): Payer: PPO

## 2016-02-07 DIAGNOSIS — S27321A Contusion of lung, unilateral, initial encounter: Secondary | ICD-10-CM | POA: Diagnosis present

## 2016-02-07 DIAGNOSIS — Z7289 Other problems related to lifestyle: Secondary | ICD-10-CM | POA: Diagnosis present

## 2016-02-07 DIAGNOSIS — Z789 Other specified health status: Secondary | ICD-10-CM | POA: Diagnosis present

## 2016-02-07 DIAGNOSIS — S2241XA Multiple fractures of ribs, right side, initial encounter for closed fracture: Secondary | ICD-10-CM | POA: Diagnosis present

## 2016-02-07 DIAGNOSIS — IMO0002 Reserved for concepts with insufficient information to code with codable children: Secondary | ICD-10-CM

## 2016-02-07 DIAGNOSIS — S3282XA Multiple fractures of pelvis without disruption of pelvic ring, initial encounter for closed fracture: Secondary | ICD-10-CM | POA: Diagnosis present

## 2016-02-07 DIAGNOSIS — B370 Candidal stomatitis: Secondary | ICD-10-CM | POA: Diagnosis not present

## 2016-02-07 DIAGNOSIS — D62 Acute posthemorrhagic anemia: Secondary | ICD-10-CM | POA: Diagnosis not present

## 2016-02-07 LAB — CBC
HCT: 27 % — ABNORMAL LOW (ref 36.0–46.0)
Hemoglobin: 8.7 g/dL — ABNORMAL LOW (ref 12.0–15.0)
MCH: 31 pg (ref 26.0–34.0)
MCHC: 32.2 g/dL (ref 30.0–36.0)
MCV: 96.1 fL (ref 78.0–100.0)
PLATELETS: 582 10*3/uL — AB (ref 150–400)
RBC: 2.81 MIL/uL — AB (ref 3.87–5.11)
RDW: 13 % (ref 11.5–15.5)
WBC: 10.3 10*3/uL (ref 4.0–10.5)

## 2016-02-07 LAB — TYPE AND SCREEN
ABO/RH(D): O POS
ANTIBODY SCREEN: NEGATIVE

## 2016-02-07 LAB — BASIC METABOLIC PANEL
ANION GAP: 7 (ref 5–15)
BUN: 6 mg/dL (ref 6–20)
CO2: 26 mmol/L (ref 22–32)
Calcium: 9 mg/dL (ref 8.9–10.3)
Chloride: 105 mmol/L (ref 101–111)
Creatinine, Ser: 0.54 mg/dL (ref 0.44–1.00)
Glucose, Bld: 102 mg/dL — ABNORMAL HIGH (ref 65–99)
POTASSIUM: 4.2 mmol/L (ref 3.5–5.1)
SODIUM: 138 mmol/L (ref 135–145)

## 2016-02-07 LAB — ABO/RH: ABO/RH(D): O POS

## 2016-02-07 MED ORDER — TRAMADOL HCL 50 MG PO TABS: 100.0000 mg | ORAL_TABLET | Freq: Four times a day (QID) | ORAL | 0 refills | Status: DC

## 2016-02-07 MED ORDER — OXYCODONE-ACETAMINOPHEN 10-325 MG PO TABS: 1.0000 | ORAL_TABLET | ORAL | 0 refills | Status: DC | PRN

## 2016-02-07 MED ORDER — TRAMADOL HCL 50 MG PO TABS
100.0000 mg | ORAL_TABLET | Freq: Four times a day (QID) | ORAL | Status: DC
Start: 2016-02-07 — End: 2016-02-07
  Administered 2016-02-07 (×2): 100 mg via ORAL
  Filled 2016-02-07 (×2): qty 2

## 2016-02-07 MED ORDER — FLUCONAZOLE 100 MG PO TABS: 100.0000 mg | ORAL_TABLET | Freq: Every day | ORAL | 0 refills | Status: DC

## 2016-02-07 MED ORDER — POLYETHYLENE GLYCOL 3350 17 G PO PACK: 17.0000 g | PACK | Freq: Every day | ORAL | Status: DC
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
## 2016-02-07 NOTE — Progress Notes (Signed)
Inpatient Rehabilitation  IP rehab consult was completed on 02/03/16 with recommendation for home health vs possible brief IP rehab admission.  Pt. Has progressed well since time of consult had is not ambulating 200' at min guard assist level with supervision for transfers.  She will  DC home today with HHPT and does not need an IP Rehab admission.  I will sign off.    [place] Admissions Coordinator Cell [phone] Office [phone]

## 2016-02-07 NOTE — Discharge Instructions (Signed)
No driving while taking oxycodone.  Use your walker when standing or walking.
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
## 2016-02-07 NOTE — Progress Notes (Signed)
Late entry, wasted Dilaudid 1mg  in trash. Witnessed by Josie Saunders

## 2016-02-07 NOTE — Care Management Note (Signed)
Case Management Note  Patient Details  Name: Ashley Cross MRN: [id number] Date of Birth: Jul 04, 1955  Subjective/Objective:  Pt medically stable for dc home today with daughter.   O2 sats 92 on room air after ambulation this afternoon.                   Action/Plan: DME has been delivered to pt's room.  HHPT to follow up with pt at home, per therapy recommendation.  [place] notified of pt dc today.    Expected Discharge Date:    02/07/16              Expected Discharge Plan:  [place]  In-House Referral:     Discharge planning Services  CM Consult  Post Acute Care Choice:  Home Health Choice offered to:  Patient  DME Arranged:  3-N-1[place]ford [facility]ile DME Agency:  [place]:  PT [facility] Agency:  [place]  Status of Service:  Completed, signed off  If discussed at [place] of Stay Meetings, dates discussed:    Additional Comments:  Reinaldo Raddle, RN, BSN  Trauma/Neuro ICU Case Manager [phone]
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
## 2016-02-07 NOTE — Progress Notes (Signed)
Ashley Cross to be D/C'd  per MD order. Discussed with the patient and all questions fully answered.  VSS, Skin clean, dry and intact without evidence of skin break down, no evidence of skin tears noted.  IV catheter discontinued intact. Site without signs and symptoms of complications. Dressing and pressure applied.  An After Visit Summary was printed and given to the patient. Patient received prescription.  D/c education completed with patient/family including follow up instructions, medication list, d/c activities limitations if indicated, with other d/c instructions as indicated by MD - patient able to verbalize understanding, all questions fully answered.   Patient instructed to return to ED, call 911, or call MD for any changes in condition.   Patient to be escorted via [place], and D/C home via private auto.

## 2016-02-08 ENCOUNTER — Encounter: Payer: Self-pay | Admitting: Gastroenterology

## 2016-02-08 NOTE — Care Management Important Message (Signed)
Important Message  Patient Details  Name: Ashley Cross MRN: [id number] Date of Birth: October 11, 1955   Medicare Important Message Given:  Yes    Orbie Pyo 02/08/2016, 8:37 AM

## 2016-02-10 DIAGNOSIS — S32810A Multiple fractures of pelvis with stable disruption of pelvic ring, initial encounter for closed fracture: Secondary | ICD-10-CM | POA: Diagnosis not present

## 2016-02-10 LAB — CULTURE, BODY FLUID W GRAM STAIN -BOTTLE

## 2016-02-10 LAB — CULTURE, BODY FLUID-BOTTLE: CULTURE: NO GROWTH

## 2016-02-15 ENCOUNTER — Ambulatory Visit: Payer: No Typology Code available for payment source

## 2016-02-15 ENCOUNTER — Other Ambulatory Visit: Payer: No Typology Code available for payment source

## 2016-02-15 DIAGNOSIS — M961 Postlaminectomy syndrome, not elsewhere classified: Secondary | ICD-10-CM | POA: Diagnosis not present

## 2016-02-15 DIAGNOSIS — G8911 Acute pain due to trauma: Secondary | ICD-10-CM | POA: Diagnosis not present

## 2016-02-15 DIAGNOSIS — G5621 Lesion of ulnar nerve, right upper limb: Secondary | ICD-10-CM | POA: Diagnosis not present

## 2016-02-29 ENCOUNTER — Other Ambulatory Visit: Payer: No Typology Code available for payment source

## 2016-02-29 ENCOUNTER — Ambulatory Visit: Payer: No Typology Code available for payment source

## 2016-03-09 DIAGNOSIS — M5416 Radiculopathy, lumbar region: Secondary | ICD-10-CM | POA: Diagnosis not present

## 2016-03-09 DIAGNOSIS — S32810D Multiple fractures of pelvis with stable disruption of pelvic ring, subsequent encounter for fracture with routine healing: Secondary | ICD-10-CM | POA: Diagnosis not present

## 2016-03-13 DIAGNOSIS — M545 Low back pain: Secondary | ICD-10-CM | POA: Diagnosis not present

## 2016-03-13 DIAGNOSIS — G8911 Acute pain due to trauma: Secondary | ICD-10-CM | POA: Diagnosis not present

## 2016-03-13 DIAGNOSIS — Z5181 Encounter for therapeutic drug level monitoring: Secondary | ICD-10-CM | POA: Diagnosis not present

## 2016-03-14 DIAGNOSIS — G4700 Insomnia, unspecified: Secondary | ICD-10-CM | POA: Diagnosis not present

## 2016-03-14 DIAGNOSIS — J449 Chronic obstructive pulmonary disease, unspecified: Secondary | ICD-10-CM | POA: Diagnosis not present

## 2016-03-14 DIAGNOSIS — F17200 Nicotine dependence, unspecified, uncomplicated: Secondary | ICD-10-CM | POA: Diagnosis not present

## 2016-03-14 DIAGNOSIS — D649 Anemia, unspecified: Secondary | ICD-10-CM | POA: Diagnosis not present

## 2016-03-14 DIAGNOSIS — F329 Major depressive disorder, single episode, unspecified: Secondary | ICD-10-CM | POA: Diagnosis not present

## 2016-03-14 DIAGNOSIS — M25551 Pain in right hip: Secondary | ICD-10-CM | POA: Diagnosis not present

## 2016-03-17 DIAGNOSIS — M5416 Radiculopathy, lumbar region: Secondary | ICD-10-CM | POA: Diagnosis not present

## 2016-03-22 ENCOUNTER — Ambulatory Visit: Payer: No Typology Code available for payment source

## 2016-03-22 ENCOUNTER — Other Ambulatory Visit: Payer: No Typology Code available for payment source

## 2016-03-22 DIAGNOSIS — M5416 Radiculopathy, lumbar region: Secondary | ICD-10-CM | POA: Diagnosis not present

## 2016-03-22 DIAGNOSIS — S32810D Multiple fractures of pelvis with stable disruption of pelvic ring, subsequent encounter for fracture with routine healing: Secondary | ICD-10-CM | POA: Diagnosis not present

## 2016-03-22 DIAGNOSIS — S322XXA Fracture of coccyx, initial encounter for closed fracture: Secondary | ICD-10-CM | POA: Diagnosis not present

## 2016-03-22 DIAGNOSIS — S3210XA Unspecified fracture of sacrum, initial encounter for closed fracture: Secondary | ICD-10-CM | POA: Diagnosis not present

## 2016-03-23 ENCOUNTER — Ambulatory Visit
Admission: RE | Admit: 2016-03-23 | Discharge: 2016-03-23 | Disposition: A | Discharge status: Home / Self Care | Payer: PPO | Source: Ambulatory Visit | Attending: Internal Medicine | Admitting: Internal Medicine

## 2016-03-23 DIAGNOSIS — N6323 Unspecified lump in the left breast, lower outer quadrant: Secondary | ICD-10-CM | POA: Diagnosis not present

## 2016-03-23 DIAGNOSIS — R928 Other abnormal and inconclusive findings on diagnostic imaging of breast: Secondary | ICD-10-CM

## 2016-03-23 DIAGNOSIS — N6324 Unspecified lump in the left breast, lower inner quadrant: Secondary | ICD-10-CM | POA: Diagnosis not present

## 2016-04-05 ENCOUNTER — Other Ambulatory Visit: Payer: Self-pay | Admitting: General Surgery

## 2016-04-05 DIAGNOSIS — N649 Disorder of breast, unspecified: Secondary | ICD-10-CM | POA: Diagnosis not present

## 2016-04-06 ENCOUNTER — Encounter (HOSPITAL_BASED_OUTPATIENT_CLINIC_OR_DEPARTMENT_OTHER): Payer: Self-pay | Admitting: *Deleted

## 2016-04-11 DIAGNOSIS — Z79899 Other long term (current) drug therapy: Secondary | ICD-10-CM | POA: Diagnosis not present

## 2016-04-11 DIAGNOSIS — M961 Postlaminectomy syndrome, not elsewhere classified: Secondary | ICD-10-CM | POA: Diagnosis not present

## 2016-04-11 DIAGNOSIS — M545 Low back pain: Secondary | ICD-10-CM | POA: Diagnosis not present

## 2016-04-11 DIAGNOSIS — Z5181 Encounter for therapeutic drug level monitoring: Secondary | ICD-10-CM | POA: Diagnosis not present

## 2016-04-11 DIAGNOSIS — Z79891 Long term (current) use of opiate analgesic: Secondary | ICD-10-CM | POA: Diagnosis not present

## 2016-04-11 DIAGNOSIS — G8911 Acute pain due to trauma: Secondary | ICD-10-CM | POA: Diagnosis not present

## 2016-04-11 DIAGNOSIS — G5621 Lesion of ulnar nerve, right upper limb: Secondary | ICD-10-CM | POA: Diagnosis not present
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
## 2016-04-12 ENCOUNTER — Ambulatory Visit (HOSPITAL_BASED_OUTPATIENT_CLINIC_OR_DEPARTMENT_OTHER): Payer: PPO | Admitting: Anesthesiology

## 2016-04-12 ENCOUNTER — Encounter (HOSPITAL_BASED_OUTPATIENT_CLINIC_OR_DEPARTMENT_OTHER): Payer: Self-pay | Admitting: *Deleted

## 2016-04-12 ENCOUNTER — Ambulatory Visit (HOSPITAL_BASED_OUTPATIENT_CLINIC_OR_DEPARTMENT_OTHER)
Admission: RE | Admit: 2016-04-12 | Discharge: 2016-04-12 | Disposition: A | Discharge status: Home / Self Care | Payer: PPO | Source: Ambulatory Visit | Attending: General Surgery | Admitting: General Surgery

## 2016-04-12 ENCOUNTER — Encounter (HOSPITAL_BASED_OUTPATIENT_CLINIC_OR_DEPARTMENT_OTHER)
Admission: RE | Disposition: A | Discharge status: Home / Self Care | Payer: Self-pay | Source: Ambulatory Visit | Attending: General Surgery

## 2016-04-12 DIAGNOSIS — D0512 Intraductal carcinoma in situ of left breast: Secondary | ICD-10-CM | POA: Insufficient documentation

## 2016-04-12 DIAGNOSIS — C50012 Malignant neoplasm of nipple and areola, left female breast: Secondary | ICD-10-CM | POA: Diagnosis not present

## 2016-04-12 DIAGNOSIS — F17210 Nicotine dependence, cigarettes, uncomplicated: Secondary | ICD-10-CM | POA: Diagnosis not present

## 2016-04-12 DIAGNOSIS — G8929 Other chronic pain: Secondary | ICD-10-CM | POA: Diagnosis not present

## 2016-04-12 DIAGNOSIS — N6320 Unspecified lump in the left breast, unspecified quadrant: Secondary | ICD-10-CM | POA: Diagnosis not present

## 2016-04-12 DIAGNOSIS — C50512 Malignant neoplasm of lower-outer quadrant of left female breast: Secondary | ICD-10-CM | POA: Diagnosis not present

## 2016-04-12 DIAGNOSIS — J309 Allergic rhinitis, unspecified: Secondary | ICD-10-CM | POA: Diagnosis not present

## 2016-04-12 DIAGNOSIS — G629 Polyneuropathy, unspecified: Secondary | ICD-10-CM | POA: Diagnosis not present

## 2016-04-12 DIAGNOSIS — M1990 Unspecified osteoarthritis, unspecified site: Secondary | ICD-10-CM | POA: Insufficient documentation

## 2016-04-12 DIAGNOSIS — F419 Anxiety disorder, unspecified: Secondary | ICD-10-CM | POA: Diagnosis not present

## 2016-04-12 DIAGNOSIS — F329 Major depressive disorder, single episode, unspecified: Secondary | ICD-10-CM | POA: Diagnosis not present

## 2016-04-12 DIAGNOSIS — Z803 Family history of malignant neoplasm of breast: Secondary | ICD-10-CM | POA: Insufficient documentation

## 2016-04-12 DIAGNOSIS — C50911 Malignant neoplasm of unspecified site of right female breast: Secondary | ICD-10-CM | POA: Diagnosis not present

## 2016-04-12 DIAGNOSIS — N649 Disorder of breast, unspecified: Secondary | ICD-10-CM | POA: Diagnosis not present

## 2016-04-12 DIAGNOSIS — M542 Cervicalgia: Secondary | ICD-10-CM | POA: Diagnosis not present

## 2016-04-12 HISTORY — DX: Other chronic pain: G89.29

## 2016-04-12 HISTORY — DX: Solitary cyst of left breast: N60.02

## 2016-04-12 HISTORY — DX: Dorsalgia, unspecified: M54.9

## 2016-04-12 HISTORY — PX: MASS EXCISION: SHX2000

## 2016-04-12 LAB — POCT HEMOGLOBIN-HEMACUE: Hemoglobin: 17.4 g/dL — ABNORMAL HIGH (ref 12.0–15.0)

## 2016-04-12 SURGERY — EXCISION MASS
Anesthesia: Monitor Anesthesia Care | Site: Breast | Laterality: Left

## 2016-04-12 MED ORDER — OXYCODONE HCL 5 MG PO TABS: 5.0000 mg | ORAL_TABLET | ORAL | Status: DC | PRN

## 2016-04-12 MED ORDER — CEFAZOLIN SODIUM-DEXTROSE 2-4 GM/100ML-% IV SOLN
INTRAVENOUS | Status: AC
  Filled 2016-04-12: qty 100

## 2016-04-12 MED ORDER — MIDAZOLAM HCL 2 MG/2ML IJ SOLN
1.0000 mg | INTRAMUSCULAR | Status: DC | PRN
  Administered 2016-04-12: 2 mg via INTRAVENOUS

## 2016-04-12 MED ORDER — PROPOFOL 500 MG/50ML IV EMUL
INTRAVENOUS | Status: DC | PRN
  Administered 2016-04-12: 250 ug/kg/min via INTRAVENOUS

## 2016-04-12 MED ORDER — ONDANSETRON HCL 4 MG/2ML IJ SOLN
INTRAMUSCULAR | Status: AC
  Filled 2016-04-12: qty 2

## 2016-04-12 MED ORDER — SODIUM CHLORIDE 0.9% FLUSH: 3.0000 mL | INTRAVENOUS | Status: DC | PRN

## 2016-04-12 MED ORDER — OXYCODONE HCL 5 MG PO TABS: 5.0000 mg | ORAL_TABLET | Freq: Four times a day (QID) | ORAL | 0 refills | Status: DC | PRN

## 2016-04-12 MED ORDER — CHLORHEXIDINE GLUCONATE CLOTH 2 % EX PADS: 6.0000 | MEDICATED_PAD | Freq: Once | CUTANEOUS | Status: DC

## 2016-04-12 MED ORDER — FENTANYL CITRATE (PF) 100 MCG/2ML IJ SOLN
INTRAMUSCULAR | Status: AC
  Filled 2016-04-12: qty 2

## 2016-04-12 MED ORDER — LIDOCAINE-EPINEPHRINE (PF) 1 %-1:200000 IJ SOLN
INTRAMUSCULAR | Status: DC | PRN
  Administered 2016-04-12: 3.5 mL

## 2016-04-12 MED ORDER — LACTATED RINGERS IV SOLN
INTRAVENOUS | Status: DC
  Administered 2016-04-12: 14:00:00 via INTRAVENOUS

## 2016-04-12 MED ORDER — ACETAMINOPHEN 650 MG RE SUPP: 650.0000 mg | RECTAL | Status: DC | PRN

## 2016-04-12 MED ORDER — OXYCODONE HCL 5 MG PO TABS
5.0000 mg | ORAL_TABLET | Freq: Once | ORAL | Status: AC | PRN
  Administered 2016-04-12: 5 mg via ORAL

## 2016-04-12 MED ORDER — FENTANYL CITRATE (PF) 100 MCG/2ML IJ SOLN: 25.0000 ug | INTRAMUSCULAR | Status: DC | PRN

## 2016-04-12 MED ORDER — OXYCODONE HCL 5 MG PO TABS
ORAL_TABLET | ORAL | Status: AC
  Filled 2016-04-12: qty 1

## 2016-04-12 MED ORDER — SCOPOLAMINE 1 MG/3DAYS TD PT72: 1.0000 | MEDICATED_PATCH | Freq: Once | TRANSDERMAL | Status: DC | PRN

## 2016-04-12 MED ORDER — SODIUM CHLORIDE 0.9% FLUSH: 3.0000 mL | Freq: Two times a day (BID) | INTRAVENOUS | Status: DC

## 2016-04-12 MED ORDER — LIDOCAINE HCL (CARDIAC) 20 MG/ML IV SOLN
INTRAVENOUS | Status: DC | PRN
  Administered 2016-04-12: 50 mg via INTRAVENOUS

## 2016-04-12 MED ORDER — BUPIVACAINE HCL (PF) 0.25 % IJ SOLN
INTRAMUSCULAR | Status: DC | PRN
  Administered 2016-04-12: 3.5 mL

## 2016-04-12 MED ORDER — ACETAMINOPHEN 325 MG PO TABS: 650.0000 mg | ORAL_TABLET | ORAL | Status: DC | PRN

## 2016-04-12 MED ORDER — ONDANSETRON HCL 4 MG/2ML IJ SOLN
INTRAMUSCULAR | Status: DC | PRN
  Administered 2016-04-12: 4 mg via INTRAVENOUS

## 2016-04-12 MED ORDER — MEPERIDINE HCL 25 MG/ML IJ SOLN: 6.2500 mg | INTRAMUSCULAR | Status: DC | PRN

## 2016-04-12 MED ORDER — METOCLOPRAMIDE HCL 5 MG/ML IJ SOLN: 10.0000 mg | Freq: Once | INTRAMUSCULAR | Status: DC | PRN

## 2016-04-12 MED ORDER — PROPOFOL 500 MG/50ML IV EMUL
INTRAVENOUS | Status: AC
  Filled 2016-04-12: qty 50

## 2016-04-12 MED ORDER — DEXAMETHASONE SODIUM PHOSPHATE 10 MG/ML IJ SOLN
INTRAMUSCULAR | Status: AC
  Filled 2016-04-12: qty 1

## 2016-04-12 MED ORDER — LIDOCAINE 2% (20 MG/ML) 5 ML SYRINGE
INTRAMUSCULAR | Status: AC
  Filled 2016-04-12: qty 5

## 2016-04-12 MED ORDER — FENTANYL CITRATE (PF) 100 MCG/2ML IJ SOLN
50.0000 ug | INTRAMUSCULAR | Status: DC | PRN
  Administered 2016-04-12: 100 ug via INTRAVENOUS

## 2016-04-12 MED ORDER — CEFAZOLIN SODIUM-DEXTROSE 2-4 GM/100ML-% IV SOLN
2.0000 g | INTRAVENOUS | Status: AC
  Administered 2016-04-12: 2 g via INTRAVENOUS

## 2016-04-12 MED ORDER — OXYCODONE HCL 5 MG/5ML PO SOLN: 5.0000 mg | Freq: Once | ORAL | Status: AC | PRN

## 2016-04-12 MED ORDER — SODIUM CHLORIDE 0.9 % IV SOLN: 250.0000 mL | INTRAVENOUS | Status: DC | PRN

## 2016-04-12 MED ORDER — MIDAZOLAM HCL 2 MG/2ML IJ SOLN
INTRAMUSCULAR | Status: AC
  Filled 2016-04-12: qty 2

## 2016-04-12 SURGICAL SUPPLY — 51 items
BINDER BREAST LRG (GAUZE/BANDAGES/DRESSINGS) ×3 IMPLANT
BLADE HEX COATED 2.75 (ELECTRODE) ×3 IMPLANT
BLADE SURG 10 STRL SS (BLADE) ×3 IMPLANT
BLADE SURG 15 STRL LF DISP TIS (BLADE) ×1 IMPLANT
BLADE SURG 15 STRL SS (BLADE) ×2
CANISTER SUCT 1200ML W/VALVE (MISCELLANEOUS) ×3 IMPLANT
CHLORAPREP W/TINT 26ML (MISCELLANEOUS) ×3 IMPLANT
COVER BACK TABLE 60X90IN (DRAPES) ×3 IMPLANT
COVER MAYO STAND STRL (DRAPES) ×3 IMPLANT
DECANTER SPIKE VIAL GLASS SM (MISCELLANEOUS) IMPLANT
DERMABOND ADVANCED (GAUZE/BANDAGES/DRESSINGS) ×2
DERMABOND ADVANCED .7 DNX12 (GAUZE/BANDAGES/DRESSINGS) ×1 IMPLANT
DRAPE LAPAROTOMY 100X72 PEDS (DRAPES) ×3 IMPLANT
DRAPE UTILITY XL STRL (DRAPES) ×3 IMPLANT
DRSG PAD ABDOMINAL 8X10 ST (GAUZE/BANDAGES/DRESSINGS) ×3 IMPLANT
ELECT REM PT RETURN 9FT ADLT (ELECTROSURGICAL) ×3
ELECTRODE REM PT RTRN 9FT ADLT (ELECTROSURGICAL) ×1 IMPLANT
GAUZE SPONGE 4X4 12PLY STRL (GAUZE/BANDAGES/DRESSINGS) ×3 IMPLANT
GLOVE BIO SURGEON STRL SZ 6 (GLOVE) ×3 IMPLANT
GLOVE BIO SURGEON STRL SZ 6.5 (GLOVE) ×2 IMPLANT
GLOVE BIO SURGEON STRL SZ7 (GLOVE) ×3 IMPLANT
GLOVE BIO SURGEONS STRL SZ 6.5 (GLOVE) ×1
GLOVE BIOGEL PI IND STRL 6.5 (GLOVE) ×1 IMPLANT
GLOVE BIOGEL PI INDICATOR 6.5 (GLOVE) ×2
GLOVE SURG SS PI 7.0 STRL IVOR (GLOVE) ×3 IMPLANT
GOWN STRL REUS W/ TWL LRG LVL3 (GOWN DISPOSABLE) ×1 IMPLANT
GOWN STRL REUS W/TWL 2XL LVL3 (GOWN DISPOSABLE) ×3 IMPLANT
GOWN STRL REUS W/TWL LRG LVL3 (GOWN DISPOSABLE) ×2
NEEDLE HYPO 25X1 1.5 SAFETY (NEEDLE) ×3 IMPLANT
NS IRRIG 1000ML POUR BTL (IV SOLUTION) ×3 IMPLANT
PACK BASIN DAY SURGERY FS (CUSTOM PROCEDURE TRAY) ×3 IMPLANT
PACK UNIVERSAL I (CUSTOM PROCEDURE TRAY) IMPLANT
PENCIL BUTTON HOLSTER BLD 10FT (ELECTRODE) ×3 IMPLANT
SLEEVE SCD COMPRESS KNEE MED (MISCELLANEOUS) IMPLANT
SPONGE GAUZE 4X4 12PLY STER LF (GAUZE/BANDAGES/DRESSINGS) ×3 IMPLANT
SPONGE LAP 18X18 X RAY DECT (DISPOSABLE) ×3 IMPLANT
STAPLER VISISTAT 35W (STAPLE) IMPLANT
SUT MNCRL AB 4-0 PS2 18 (SUTURE) ×3 IMPLANT
SUT SILK 3 0 TIES 17X18 (SUTURE)
SUT SILK 3-0 18XBRD TIE BLK (SUTURE) IMPLANT
SUT VIC AB 2-0 SH 27 (SUTURE)
SUT VIC AB 2-0 SH 27XBRD (SUTURE) IMPLANT
SUT VIC AB 3-0 SH 27 (SUTURE) ×2
SUT VIC AB 3-0 SH 27X BRD (SUTURE) ×1 IMPLANT
SUT VICRYL 4-0 PS2 18IN ABS (SUTURE) ×3 IMPLANT
SYR CONTROL 10ML LL (SYRINGE) ×3 IMPLANT
TOWEL OR 17X24 6PK STRL BLUE (TOWEL DISPOSABLE) ×3 IMPLANT
TOWEL OR NON WOVEN STRL DISP B (DISPOSABLE) ×3 IMPLANT
TUBE CONNECTING 20'X1/4 (TUBING)
TUBE CONNECTING 20X1/4 (TUBING) IMPLANT
YANKAUER SUCT BULB TIP NO VENT (SUCTIONS) IMPLANT

## 2016-04-12 NOTE — Interval H&P Note (Signed)
History and Physical Interval Note:  04/12/2016 3:45 PM  Ashley Cross  has presented today for surgery, with the diagnosis of LEFT NIPPLE MASS  The various methods of treatment have been discussed with the patient and family. After consideration of risks, benefits and other options for treatment, the patient has consented to  Procedure(s): EXCISION OF LEFT NIPPLE MASS (Left) as a surgical intervention .  The patient's history has been reviewed, patient examined, no change in status, stable for surgery.  I have reviewed the patient's chart and labs.  Questions were answered to the patient's satisfaction.     [name]

## 2016-04-12 NOTE — Anesthesia Procedure Notes (Signed)
Procedure Name: MAC Performed by: Terrance Mass Pre-anesthesia Checklist: Patient identified, Emergency Drugs available, Suction available, Patient being monitored and Timeout performed Oxygen Delivery Method: Simple face mask Placement Confirmation: positive ETCO2

## 2016-04-12 NOTE — Anesthesia Postprocedure Evaluation (Signed)
Anesthesia Post Note  Patient: Ashley Cross  Procedure(s) Performed: Procedure(s) (LRB): EXCISION OF LEFT NIPPLE MASS (Left)  Patient location during evaluation: PACU Anesthesia Type: MAC Level of consciousness: awake and alert and oriented Pain management: pain level controlled Vital Signs Assessment: post-procedure vital signs reviewed and stable Respiratory status: spontaneous breathing, nonlabored ventilation and respiratory function stable Cardiovascular status: stable and blood pressure returned to baseline Postop Assessment: no signs of nausea or vomiting Anesthetic complications: no       Last Vitals:  Vitals:   04/12/16 1715 04/12/16 1723  BP: (!) 143/73   Pulse: 92 86  Resp: 15 17  Temp:      Last Pain:  Vitals:   04/12/16 1723  TempSrc:   PainSc: 5                  Armetta Henri A.

## 2016-04-12 NOTE — Transfer of Care (Signed)
Immediate Anesthesia Transfer of Care Note  Patient: Ashley Cross  Procedure(s) Performed: Procedure(s): EXCISION OF LEFT NIPPLE MASS (Left)  Patient Location: PACU  Anesthesia Type:MAC  Level of Consciousness: awake, alert  and oriented  Airway & Oxygen Therapy: Patient Spontanous Breathing  Post-op Assessment: Report given to RN and Post -op Vital signs reviewed and stable  Post vital signs: Reviewed and stable  Last Vitals:  Vitals:   04/12/16 1329  BP: 130/68  Pulse: 97  Resp: 20  Temp: 36.6 C    Last Pain:  Vitals:   04/12/16 1329  TempSrc: Oral         Complications: No apparent anesthesia complications

## 2016-04-12 NOTE — Anesthesia Preprocedure Evaluation (Signed)
Anesthesia Evaluation  Patient identified by MRN, date of birth, ID band Patient awake    Reviewed: Allergy & Precautions, NPO status , Patient's Chart, lab work & pertinent test results  Airway Mallampati: II       Dental no notable dental hx. (+) Poor Dentition, Caps, Chipped   Pulmonary Current Smoker,    Pulmonary exam normal breath sounds clear to auscultation       Cardiovascular negative cardio ROS Normal cardiovascular exam Rhythm:Regular Rate:Normal     Neuro/Psych PSYCHIATRIC DISORDERS Anxiety Depression Spinal stenosis with myelopathy Peripheral neuropathy  Neuromuscular disease    GI/Hepatic negative GI ROS, (+)     substance abuse  alcohol use,   Endo/Other  Left nipple mass  Renal/GU negative Renal ROS  negative genitourinary   Musculoskeletal  (+) Arthritis , Chronic neck pain   Abdominal   Peds  Hematology  (+) anemia ,   Anesthesia Other Findings   Reproductive/Obstetrics                             Anesthesia Physical Anesthesia Plan  ASA: III  Anesthesia Plan: MAC   Post-op Pain Management:    Induction: Intravenous  Airway Management Planned: Natural Airway, Nasal Cannula and Simple Face Mask  Additional Equipment:   Intra-op Plan:   Post-operative Plan: Extubation in OR  Informed Consent: I have reviewed the patients History and Physical, chart, labs and discussed the procedure including the risks, benefits and alternatives for the proposed anesthesia with the patient or authorized representative who has indicated his/her understanding and acceptance.   Dental advisory given  Plan Discussed with: Anesthesiologist, CRNA and Surgeon  Anesthesia Plan Comments:         Anesthesia Quick Evaluation

## 2016-04-12 NOTE — Op Note (Signed)
PRE-OPERATIVE DIAGNOSIS: left areolar mass, 8 mm  POST-OPERATIVE DIAGNOSIS:  Same  PROCEDURE:  Procedure(s): Excision of left areolar mass  SURGEON:  Surgeon(s): Stark Klein, MD  ANESTHESIA:   local and MAC  DRAINS: none   LOCAL MEDICATIONS USED:  BUPIVICAINE  and LIDOCAINE   SPECIMEN:  Source of Specimen:  left areolar mass  DISPOSITION OF SPECIMEN:  PATHOLOGY  COUNTS:  YES  DICTATION: .Dragon Dictation  PLAN OF CARE: Discharge to home after PACU  PATIENT DISPOSITION:  PACU - hemodynamically stable.  FINDINGS:  Consistent with sebaceous cyst  EBL: min  PROCEDURE: Pt was identified in the holding area and taken to the OR where she was placed supine on the OR table.  MAC anesthesia was induced.  Her left breast was prepped and draped in sterile fashion.  A time out was performed according to the surgical safety checklist.  When all was correct, we continued.  The left inferolateral areola was infiltrated with local anesthetic.  A pie shaped wedge or areola was taken with the #15 blade followed by the cautery.  The mass was passed off the field.  The cavity was then irrigated.  The skin was reapproximated in a T shape with 3-0 vicryl deep dermal suture and 4-0 monocryl subcuticular suture.  The skin was cleaned, dried, and dressed with dermabond.    Needle, sponge, and instrument counts were correct x 2.  The patient was allowed to emerge from anesthesia and taken to the PACU in stable condition.

## 2016-04-12 NOTE — Discharge Instructions (Addendum)
[facility] °Office Phone Number [phone] ° °BREAST BIOPSY/ PARTIAL MASTECTOMY: POST OP INSTRUCTIONS ° °Always review your discharge instruction sheet given to you by the facility where your surgery was performed. ° °IF YOU HAVE DISABILITY OR FAMILY LEAVE FORMS, YOU MUST BRING THEM TO THE OFFICE FOR PROCESSING.  DO NOT GIVE THEM TO YOUR DOCTOR. ° °1. A prescription for pain medication may be given to you upon discharge.  Take your pain medication as prescribed, if needed.  If narcotic pain medicine is not needed, then you may take acetaminophen (Tylenol) or ibuprofen (Advil) as needed. °2. Take your usually prescribed medications unless otherwise directed °3. If you need a refill on your pain medication, please contact your pharmacy.  They will contact our office to request authorization.  Prescriptions will not be filled after 5pm or on week-ends. °4. You should eat very light the first 24 hours after surgery, such as soup, crackers, pudding, etc.  Resume your normal diet the day after surgery. °5. Most patients will experience some swelling and bruising in the breast.  Ice packs and a good support bra will help.  Swelling and bruising can take several days to resolve.  °6. It is common to experience some constipation if taking pain medication after surgery.  Increasing fluid intake and taking a stool softener will usually help or prevent this problem from occurring.  A mild laxative (Milk of Magnesia or Miralax) should be taken according to package directions if there are no bowel movements after 48 hours. °7. Unless discharge instructions indicate otherwise, you may remove your bandages 48 hours after surgery, and you may shower at that time.  You may have steri-strips (small skin tapes) in place directly over the incision.  These strips should be left on the skin for 7-10 days.   Any sutures or staples will be removed at the office during your follow-up visit. °8. ACTIVITIES:  You may resume  regular daily activities (gradually increasing) beginning the next day.  Wearing a good support bra or sports bra (or the breast binder) minimizes pain and swelling.  You may have sexual intercourse when it is comfortable. °a. You may drive when you no longer are taking prescription pain medication, you can comfortably wear a seatbelt, and you can safely maneuver your car and apply brakes. °b. RETURN TO WORK:  __________1 week_______________ °9. You should see your doctor in the office for a follow-up appointment approximately two weeks after your surgery.  Your doctor’s nurse will typically make your follow-up appointment when she calls you with your pathology report.  Expect your pathology report 2-3 business days after your surgery.  You may call to check if you do not hear from us after three days. ° ° °WHEN TO CALL YOUR DOCTOR: °1. Fever over 101.0 °2. Nausea and/or vomiting. °3. Extreme swelling or bruising. °4. Continued bleeding from incision. °5. Increased pain, redness, or drainage from the incision. ° °The clinic staff is available to answer your questions during regular business hours.  Please don’t hesitate to call and ask to speak to one of the nurses for clinical concerns.  If you have a medical emergency, go to the nearest emergency room or call 911.  A surgeon from [facility] is always on call at the hospital. ° °For further questions, please visit [email or web address]  ° ° °Post Anesthesia Home Care Instructions ° °Activity: °Get plenty of rest for the remainder of the day. A responsible adult should stay with you for 24   hours following the procedure.  °For the next 24 hours, DO NOT: °-Drive a car °-Operate machinery °-Drink alcoholic beverages °-Take any medication unless instructed by your physician °-Make any legal decisions or sign important papers. ° °Meals: °Start with liquid foods such as gelatin or soup. Progress to regular foods as tolerated. Avoid greasy, spicy, heavy  foods. If nausea and/or vomiting occur, drink only clear liquids until the nausea and/or vomiting subsides. Call your physician if vomiting continues. ° °Special Instructions/Symptoms: °Your throat may feel dry or sore from the anesthesia or the breathing tube placed in your throat during surgery. If this causes discomfort, gargle with warm salt water. The discomfort should disappear within 24 hours. ° °If you had a scopolamine patch placed behind your ear for the management of post- operative nausea and/or vomiting: ° °1. The medication in the patch is effective for 72 hours, after which it should be removed.  Wrap patch in a tissue and discard in the trash. Wash hands thoroughly with soap and water. °2. You may remove the patch earlier than 72 hours if you experience unpleasant side effects which may include dry mouth, dizziness or visual disturbances. °3. Avoid touching the patch. Wash your hands with soap and water after contact with the patch. °  ° °

## 2016-04-15 ENCOUNTER — Encounter (HOSPITAL_BASED_OUTPATIENT_CLINIC_OR_DEPARTMENT_OTHER): Payer: Self-pay | Admitting: General Surgery

## 2016-04-16 ENCOUNTER — Telehealth: Payer: Self-pay | Admitting: General Surgery

## 2016-04-16 NOTE — Telephone Encounter (Signed)
Discussed pathology with patient.  Unexpected finding of cancer of areolar mass.  Will get Breast mri since tomo/us negative.  Will refer to oncology and radiation.

## 2016-04-17 ENCOUNTER — Encounter: Payer: Self-pay | Admitting: Radiation Oncology

## 2016-04-19 ENCOUNTER — Telehealth: Payer: Self-pay | Admitting: Hematology and Oncology

## 2016-04-19 NOTE — Telephone Encounter (Signed)
Appt has been scheduled for the pt to see [name] on 3/16 at 12pm. Aware to arrive 15 minutes early. Pt agreed to the appt date and time.

## 2016-04-20 ENCOUNTER — Encounter: Payer: Self-pay | Admitting: Radiation Oncology

## 2016-04-20 ENCOUNTER — Ambulatory Visit (HOSPITAL_BASED_OUTPATIENT_CLINIC_OR_DEPARTMENT_OTHER): Payer: PPO | Admitting: Hematology and Oncology

## 2016-04-20 ENCOUNTER — Encounter: Payer: Self-pay | Admitting: Hematology and Oncology

## 2016-04-20 DIAGNOSIS — C50512 Malignant neoplasm of lower-outer quadrant of left female breast: Secondary | ICD-10-CM

## 2016-04-20 DIAGNOSIS — Z8042 Family history of malignant neoplasm of prostate: Secondary | ICD-10-CM

## 2016-04-20 DIAGNOSIS — Z170 Estrogen receptor positive status [ER+]: Secondary | ICD-10-CM | POA: Insufficient documentation

## 2016-04-20 DIAGNOSIS — Z720 Tobacco use: Secondary | ICD-10-CM | POA: Diagnosis not present

## 2016-04-20 DIAGNOSIS — Z803 Family history of malignant neoplasm of breast: Secondary | ICD-10-CM

## 2016-04-20 NOTE — Assessment & Plan Note (Signed)
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 I discussed the margins  We also discussed the staging along with ER/PR and HER-2/neu testing.  Recommendation: 1. Sentinel lymph node study 2. no indication for Oncotype DX testing because of favorable prognostic findings especially being less than 1 cm and being grade 1. 3. Followed by adjuvant radiation 4. Followed by adjuvant antiestrogen therapy with letrozole 2.5 mg daily 5 years  Return to clinic at the end of radiation therapy to start antiestrogen.

## 2016-04-20 NOTE — Progress Notes (Signed)
[facility] CONSULT NOTE  Patient Care Team: Provider Not In System as PCP - General Perrin Maltese, MD (Internal Medicine)  CHIEF COMPLAINTS/PURPOSE OF CONSULTATION:  Newly diagnosed breast cancer  HISTORY OF PRESENTING ILLNESS:  Ashley Cross 61 y.o. female is here because of recent diagnosis of left breast cancer. Patient felt a palpable lump next to the nipple in the left breast which she had observed for about a year. Because it was getting slightly bigger she had evaluations with mammogram. The mammogram did not reveal any abnormality because this was subcutaneous. She was then seen by Dr. Barry Dienes who did an excisional biopsy of this mass and it came back as grade 1 invasive ductal carcinoma that was ER/PR positive and HER-2 negative with a Ki-67 of 15%. Tumor was 0.8 cm in size. She was sent to [place] for discussion regarding adjuvant treatment options.  I reviewed her records extensively and collaborated the history with the patient.  SUMMARY OF ONCOLOGIC HISTORY:   Malignant neoplasm of lower-outer quadrant of left breast of female, estrogen receptor positive ([place])   04/12/2016 Initial Diagnosis    Left breast biopsy/ lumpectomy: IDC grade 10.8 cm with DCIS grade 1, margins negative, ER 100%, PR 100%, HER-2 negative, Ki-67 15%, T1b NX stage I a       MEDICAL HISTORY:  Past Medical History:  Diagnosis Date  . Anxiety   . Breast cancer ([place])    Left breast(nipple mass)  . Chronic back pain   . Cyst of left nipple   . Depression     SURGICAL HISTORY: Past Surgical History:  Procedure Laterality Date  . BACK SURGERY    . BREAST BIOPSY Left    core- neg  . BREAST SURGERY Left    breast biopsy  . CERVICAL CONE BIOPSY    . CERVICAL FUSION     times 2  . COLONOSCOPY WITH PROPOFOL N/A 04/18/2015   Procedure: COLONOSCOPY WITH PROPOFOL;  Surgeon: [name], MD;  Location: [facility] ENDOSCOPY;  Service: Endoscopy;  Laterality: N/A;  . MASS EXCISION Left  04/12/2016   Procedure: EXCISION OF LEFT NIPPLE MASS;  Surgeon: Stark Klein, MD;  Location: [facility];  Service: General;  Laterality: Left;  . NECK SURGERY    . POSTERIOR CERVICAL FUSION/FORAMINOTOMY  03/07/2011   Procedure: POSTERIOR CERVICAL FUSION/FORAMINOTOMY LEVEL 4;  Surgeon: Eustace Moore, MD;  Location: [facility] NEURO ORS;  Service: Neurosurgery;  Laterality: N/A;  cervical three to thoracic one posterior cervical fusion   . TONSILLECTOMY      SOCIAL HISTORY: Social History   Social History  . Marital status: Single    Spouse name: N/A  . Number of children: N/A  . Years of education: N/A   Occupational History  . Not on file.   Social History Main Topics  . Smoking status: Current Every Day Smoker    Packs/day: 1.00    Years: 30.00    Types: Cigarettes  . Smokeless tobacco: Never Used  . Alcohol use Yes     Comment: social  . Drug use: Yes     Comment: on opana since jan 2018  . Sexual activity: Not on file   Other Topics Concern  . Not on file   Social History Narrative   ** Merged History Encounter **        FAMILY HISTORY: Family History  Problem Relation Age of Onset  . Breast cancer Mother 41  . Cancer Father  prostate  . Hypertension Father     ALLERGIES:  is allergic to sulfa antibiotics and sulfonamide derivatives.  MEDICATIONS:  Current Outpatient Prescriptions  Medication Sig Dispense Refill  . amitriptyline (ELAVIL) 50 MG tablet Take 50 mg by mouth at bedtime.    . gabapentin (NEURONTIN) 600 MG tablet Take 1,200 mg by mouth 3 (three) times daily.    Marland Kitchen oxyCODONE (OXY IR/ROXICODONE) 5 MG immediate release tablet Take 1-2 tablets (5-10 mg total) by mouth every 6 (six) hours as needed for moderate pain, severe pain or breakthrough pain. 20 tablet 0  . oxymorphone (OPANA) 10 MG tablet Take 10 mg by mouth every 4 (four) hours as needed for pain.    . traZODone (DESYREL) 50 MG tablet Take 50 mg by mouth at bedtime as needed for sleep.      No current facility-administered medications for this visit.     REVIEW OF SYSTEMS:   Constitutional: Denies fevers, chills or abnormal night sweats Eyes: Denies blurriness of vision, double vision or watery eyes Ears, nose, mouth, throat, and face: Denies mucositis or sore throat Respiratory: Denies cough, dyspnea or wheezes Cardiovascular: Denies palpitation, chest discomfort or lower extremity swelling Gastrointestinal:  Denies nausea, heartburn or change in bowel habits Skin: Denies abnormal skin rashes Lymphatics: Denies new lymphadenopathy or easy bruising Neurological:Denies numbness, tingling or new weaknesses Behavioral/Psych: Mood is stable, no new changes  Breast:  Recent excision/surgery left breast All other systems were reviewed with the patient and are negative.  PHYSICAL EXAMINATION: ECOG PERFORMANCE STATUS: 1 - Symptomatic but completely ambulatory  Vitals:   04/20/16 1159  BP: (!) 168/76  Pulse: (!) 110  Resp: 18  Temp: 98.3 F (36.8 C)   Filed Weights   04/20/16 1159  Weight: 128 lb (58.1 kg)    GENERAL:alert, no distress and comfortable SKIN: skin color, texture, turgor are normal, no rashes or significant lesions EYES: normal, conjunctiva are pink and non-injected, sclera clear OROPHARYNX:no exudate, no erythema and lips, buccal mucosa, and tongue normal  NECK: supple, thyroid normal size, non-tender, without nodularity LYMPH:  no palpable lymphadenopathy in the cervical, axillary or inguinal LUNGS: clear to auscultation and percussion with normal breathing effort HEART: regular rate & rhythm and no murmurs and no lower extremity edema ABDOMEN:abdomen soft, non-tender and normal bowel sounds Musculoskeletal:no cyanosis of digits and no clubbing  PSYCH: alert & oriented x 3 with fluent speech NEURO: no focal motor/sensory deficits  LABORATORY DATA:  I have reviewed the data as listed Lab Results  Component Value Date   WBC 10.3 02/07/2016    HGB 17.4 (H) 04/12/2016   HCT 27.0 (L) 02/07/2016   MCV 96.1 02/07/2016   PLT 582 (H) 02/07/2016   Lab Results  Component Value Date   NA 138 02/07/2016   K 4.2 02/07/2016   CL 105 02/07/2016   CO2 26 02/07/2016    RADIOGRAPHIC STUDIES: I have personally reviewed the radiological reports and agreed with the findings in the report.  ASSESSMENT AND PLAN:  Malignant neoplasm of lower-outer quadrant of left breast of female, estrogen receptor positive ([place]) 04/12/2016 Left breast biopsy/ lumpectomy: IDC grade 10.8 cm with DCIS grade 1, margins negative, ER 100%, PR 100%, HER-2 negative, Ki-67 15%, T1b NX stage I a  Pathology counseling: I discussed the final pathology report of the patient provided  a copy of this report. I discussed the margins  We also discussed the staging along with ER/PR and HER-2/neu testing.  Recommendation: 1. Sentinel lymph node  study 2. no indication for Oncotype DX testing because of favorable prognostic findings especially being less than 1 cm and being grade 1. 3. Followed by adjuvant radiation 4. Followed by adjuvant antiestrogen therapy with letrozole 2.5 mg daily 5 years  Return to clinic at the end of radiation therapy to start antiestrogen.   All questions were answered. The patient knows to call the clinic with any problems, questions or concerns.    Rulon Eisenmenger, MD 04/20/16

## 2016-04-20 NOTE — Progress Notes (Signed)
Location of Breast Cancer: Left Breast (nipple mass) Lower Outer Quadrant  Histology per Pathology Report: Diagnosis 04/12/2016: Nipple Biopsy, left INVASIVE DUCTAL CARCINOMA, GRADE 1, 0.8 CM (PT1B)DUCTAL CARCINOMA IN SITU IS IDENTIFIED, GRADE 1MARGINS OF RESECTION ARE NEGATIVE FOR CARCINOMA  Receptor Status: ER(  100%+ ), PR (  100%+ ), Her2-neu (neg, ratio 1.39 ), Ki-67(15%)  Did patient present with symptoms (if so, please note symptoms) or was this found on screening mammography?: routine mammogram, but patient felt and  knew of mass on nipple, for about a year   Past/Anticipated interventions by surgeon, if any: [name], MD seen this past Monday 04/23/16 no notes, will scheule an MRI , not in Epic as yet  Past/Anticipated interventions by medical oncology, if any: Chemotherapy : [name] seen 04/20/16:No oncotypte testing as  < 1cm and being grade 1, after radiation, adjuvant antiestrogen therapy with letrozole 2.80m oral  daily x 5 years  Lymphedema issues, if any:  NO  Pain issues, if any:  Shooting pains at times due to activity  SAFETY ISSUES: NO  Prior radiation? NO  Pacemaker/ICD? NO  Possible current pregnancy? NO  Is the patient on methotrexate? NO   Current Complaints / other details:  Menarche age age 61 [id number],  Single; depression, current cigarette smoker,  1ppd x 30 years, moderate alcohol use, drug use=opana since January 2018; ,or smokelss tobcco Mother Breast cancer, Father prostate cancer,htn,colon polyps   BP 124/84 (BP Location: Right Arm, Patient Position: Sitting, Cuff Size: Normal)   Pulse (!) 102   Temp 98.2 F (36.8 C)   Resp 18   Ht 5' 6"  (1.676 m)   Wt 130 lb (59 kg)   BMI 20.98 kg/m   Wt Readings from Last 3 Encounters:  04/25/16 130 lb (59 kg)  04/20/16 128 lb (58.1 kg)  04/12/16 128 lb (58.1 kg)   MRebecca Eaton RN 04/20/2016,12:10 PM

## 2016-04-23 ENCOUNTER — Other Ambulatory Visit: Payer: Self-pay | Admitting: General Surgery

## 2016-04-23 ENCOUNTER — Telehealth: Payer: Self-pay | Admitting: *Deleted

## 2016-04-23 DIAGNOSIS — C50012 Malignant neoplasm of nipple and areola, left female breast: Secondary | ICD-10-CM

## 2016-04-23 DIAGNOSIS — Z170 Estrogen receptor positive status [ER+]: Principal | ICD-10-CM

## 2016-04-23 NOTE — Telephone Encounter (Signed)
Called pt to give navigation resources and contact information. Denies questions at this time. Discussed with pt that I have reached out to Dr. Marlowe Aschoff office in regards to SLN bx. Encourage pt to call with needs. Contact information provided.

## 2016-04-25 ENCOUNTER — Other Ambulatory Visit: Payer: Self-pay | Admitting: General Surgery

## 2016-04-25 ENCOUNTER — Ambulatory Visit
Admission: RE | Admit: 2016-04-25 | Discharge: 2016-04-25 | Disposition: A | Discharge status: Home / Self Care | Payer: PPO | Source: Ambulatory Visit | Attending: Radiation Oncology | Admitting: Radiation Oncology

## 2016-04-25 ENCOUNTER — Encounter: Payer: Self-pay | Admitting: Radiation Oncology

## 2016-04-25 DIAGNOSIS — Z79899 Other long term (current) drug therapy: Secondary | ICD-10-CM | POA: Diagnosis not present

## 2016-04-25 DIAGNOSIS — Z510 Encounter for antineoplastic radiation therapy: Secondary | ICD-10-CM | POA: Insufficient documentation

## 2016-04-25 DIAGNOSIS — G8929 Other chronic pain: Secondary | ICD-10-CM | POA: Insufficient documentation

## 2016-04-25 DIAGNOSIS — M549 Dorsalgia, unspecified: Secondary | ICD-10-CM | POA: Insufficient documentation

## 2016-04-25 DIAGNOSIS — C50912 Malignant neoplasm of unspecified site of left female breast: Secondary | ICD-10-CM

## 2016-04-25 DIAGNOSIS — C50512 Malignant neoplasm of lower-outer quadrant of left female breast: Secondary | ICD-10-CM | POA: Diagnosis not present

## 2016-04-25 DIAGNOSIS — F419 Anxiety disorder, unspecified: Secondary | ICD-10-CM | POA: Diagnosis not present

## 2016-04-25 DIAGNOSIS — Z882 Allergy status to sulfonamides status: Secondary | ICD-10-CM | POA: Insufficient documentation

## 2016-04-25 DIAGNOSIS — Z9889 Other specified postprocedural states: Secondary | ICD-10-CM | POA: Diagnosis not present

## 2016-04-25 DIAGNOSIS — Z79891 Long term (current) use of opiate analgesic: Secondary | ICD-10-CM | POA: Insufficient documentation

## 2016-04-25 DIAGNOSIS — Z170 Estrogen receptor positive status [ER+]: Secondary | ICD-10-CM | POA: Insufficient documentation

## 2016-04-25 DIAGNOSIS — F329 Major depressive disorder, single episode, unspecified: Secondary | ICD-10-CM | POA: Insufficient documentation

## 2016-04-25 DIAGNOSIS — Z803 Family history of malignant neoplasm of breast: Secondary | ICD-10-CM | POA: Diagnosis not present

## 2016-04-25 DIAGNOSIS — F17210 Nicotine dependence, cigarettes, uncomplicated: Secondary | ICD-10-CM | POA: Insufficient documentation

## 2016-04-25 HISTORY — DX: Malignant neoplasm of unspecified site of unspecified female breast: C50.919

## 2016-04-25 NOTE — Progress Notes (Signed)
Please see the Nurse Progress Note in the MD Initial Consult Encounter for this patient. 
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
## 2016-04-26 ENCOUNTER — Telehealth: Payer: Self-pay | Admitting: *Deleted

## 2016-04-26 NOTE — Telephone Encounter (Signed)
called Cindy,asked if she knew anything more of her cousin's history and if her mom had triple neg breast cancer?, Patient called her brother yesterday and found out that her cousin  They believe she had breast cancer in her 71's and passed away 3-4 years ago,. Only thing about her mom is that she had breast cancer and that it went to her brain, thnaked the patient and will relay this to Bryson Ha, she thanked everyone that saw her yesterday and stated she felt safe with [place] 9:49 AM

## 2016-04-27 ENCOUNTER — Ambulatory Visit
Admission: RE | Admit: 2016-04-27 | Discharge: 2016-04-27 | Disposition: A | Discharge status: Home / Self Care | Payer: PPO | Source: Ambulatory Visit | Attending: General Surgery | Admitting: General Surgery

## 2016-04-27 ENCOUNTER — Telehealth: Payer: Self-pay | Admitting: *Deleted

## 2016-04-27 DIAGNOSIS — Z803 Family history of malignant neoplasm of breast: Secondary | ICD-10-CM | POA: Diagnosis not present

## 2016-04-27 DIAGNOSIS — C50912 Malignant neoplasm of unspecified site of left female breast: Secondary | ICD-10-CM

## 2016-04-27 MED ORDER — GADOBENATE DIMEGLUMINE 529 MG/ML IV SOLN
12.0000 mL | Freq: Once | INTRAVENOUS | Status: AC | PRN
  Administered 2016-04-27: 12 mL via INTRAVENOUS

## 2016-04-27 NOTE — Telephone Encounter (Signed)
Left vm no genetic testing  Recommended at this time given the information so far 9:55 AM

## 2016-04-30 ENCOUNTER — Telehealth: Payer: Self-pay | Admitting: General Surgery

## 2016-04-30 NOTE — Telephone Encounter (Signed)
Discussed MRI with patient.  Will contact [name] about best test to evaluate sternum.  Will proceed with sentinel lymph node biopsy.

## 2016-05-01 ENCOUNTER — Other Ambulatory Visit: Payer: Self-pay | Admitting: Hematology and Oncology

## 2016-05-01 DIAGNOSIS — C50512 Malignant neoplasm of lower-outer quadrant of left female breast: Secondary | ICD-10-CM

## 2016-05-01 DIAGNOSIS — Z170 Estrogen receptor positive status [ER+]: Principal | ICD-10-CM

## 2016-05-01 NOTE — Telephone Encounter (Signed)
I called the patient and left a message that we will be obtaining a PET/CT scan

## 2016-05-15 ENCOUNTER — Ambulatory Visit (HOSPITAL_COMMUNITY): Admission: RE | Admit: 2016-05-15 | Payer: PPO | Source: Ambulatory Visit

## 2016-05-15 NOTE — Pre-Procedure Instructions (Addendum)
    Kylie Simmonds  05/15/2016      [facility] [id number], [place] [address] Phone: [phone] Fax: [phone]  [place], [place] [address] [place] [address] Phone: [phone] Fax: [phone]    Your procedure is scheduled on April 24 at 230 PM.  Report to [facility] at 1230 PM.  Call this number if you have problems the morning of surgery:  [phone]   Remember:  Do not eat food or drink liquids after midnight.  Take these medicines the morning of surgery with A SIP OF WATER gabapentin (neurontin), oxycodone (roxicodone)-if needed for pain, oxymorphone (Opana).   7 days prior to surgery STOP taking any Aspirin, Aleve, Naproxen, Ibuprofen, Motrin, Advil, Goody's, BC's, all herbal medications, fish oil, and all vitamins   Do not wear jewelry, make-up or nail polish.  Do not wear lotions, powders, or perfumes, or deoderant.  Do not shave 48 hours prior to surgery.   Do not bring valuables to the hospital.  [facility] is not responsible for any belongings or valuables.  Contacts, dentures or bridgework may not be worn into surgery.  Leave your suitcase in the car.  After surgery it may be brought to your room.  For patients admitted to the hospital, discharge time will be determined by your treatment team.  Patients discharged the day of surgery will not be allowed to drive home.   Please read over the following fact sheets that you were given. Pain Booklet, Coughing and Deep Breathing and Surgical Site Infection Prevention

## 2016-05-16 ENCOUNTER — Encounter (HOSPITAL_COMMUNITY)
Admission: RE | Admit: 2016-05-16 | Discharge: 2016-05-16 | Disposition: A | Discharge status: Home / Self Care | Payer: PPO | Source: Ambulatory Visit | Attending: General Surgery | Admitting: General Surgery

## 2016-05-16 DIAGNOSIS — C50912 Malignant neoplasm of unspecified site of left female breast: Secondary | ICD-10-CM | POA: Insufficient documentation

## 2016-05-16 DIAGNOSIS — Z01812 Encounter for preprocedural laboratory examination: Secondary | ICD-10-CM | POA: Diagnosis not present

## 2016-05-16 LAB — BASIC METABOLIC PANEL
Anion gap: 10 (ref 5–15)
BUN: 8 mg/dL (ref 6–20)
CO2: 27 mmol/L (ref 22–32)
CREATININE: 0.52 mg/dL (ref 0.44–1.00)
Calcium: 9.7 mg/dL (ref 8.9–10.3)
Chloride: 101 mmol/L (ref 101–111)
Glucose, Bld: 103 mg/dL — ABNORMAL HIGH (ref 65–99)
Potassium: 4.4 mmol/L (ref 3.5–5.1)
Sodium: 138 mmol/L (ref 135–145)

## 2016-05-16 LAB — CBC
HCT: 45 % (ref 36.0–46.0)
Hemoglobin: 14.7 g/dL (ref 12.0–15.0)
MCH: 30.8 pg (ref 26.0–34.0)
MCHC: 32.7 g/dL (ref 30.0–36.0)
MCV: 94.3 fL (ref 78.0–100.0)
PLATELETS: 227 10*3/uL (ref 150–400)
RBC: 4.77 MIL/uL (ref 3.87–5.11)
RDW: 14.7 % (ref 11.5–15.5)
WBC: 6 10*3/uL (ref 4.0–10.5)

## 2016-05-16 NOTE — Progress Notes (Signed)
Pt given breeze instructed to refrigerate and drink at 1030 DOS. Pt verbalized understanding.

## 2016-05-21 ENCOUNTER — Encounter (HOSPITAL_COMMUNITY)
Admission: RE | Admit: 2016-05-21 | Discharge: 2016-05-21 | Disposition: A | Discharge status: Home / Self Care | Payer: PPO | Source: Ambulatory Visit | Attending: Hematology and Oncology | Admitting: Hematology and Oncology

## 2016-05-21 DIAGNOSIS — C50912 Malignant neoplasm of unspecified site of left female breast: Secondary | ICD-10-CM | POA: Diagnosis not present

## 2016-05-21 DIAGNOSIS — R937 Abnormal findings on diagnostic imaging of other parts of musculoskeletal system: Secondary | ICD-10-CM | POA: Insufficient documentation

## 2016-05-21 DIAGNOSIS — Z170 Estrogen receptor positive status [ER+]: Secondary | ICD-10-CM | POA: Insufficient documentation

## 2016-05-21 DIAGNOSIS — C50512 Malignant neoplasm of lower-outer quadrant of left female breast: Secondary | ICD-10-CM | POA: Insufficient documentation

## 2016-05-21 LAB — GLUCOSE, CAPILLARY: Glucose-Capillary: 99 mg/dL (ref 65–99)

## 2016-05-21 MED ORDER — FLUDEOXYGLUCOSE F - 18 (FDG) INJECTION
6.3900 | Freq: Once | INTRAVENOUS | Status: AC
  Administered 2016-05-21: 6.39 via INTRAVENOUS

## 2016-05-22 ENCOUNTER — Encounter: Payer: Self-pay | Admitting: Radiation Oncology

## 2016-05-29 ENCOUNTER — Ambulatory Visit (HOSPITAL_COMMUNITY): Payer: PPO | Admitting: Anesthesiology

## 2016-05-29 ENCOUNTER — Encounter (HOSPITAL_COMMUNITY)
Admission: RE | Admit: 2016-05-29 | Discharge: 2016-05-29 | Disposition: A | Discharge status: Home / Self Care | Payer: PPO | Source: Ambulatory Visit | Attending: General Surgery | Admitting: General Surgery

## 2016-05-29 ENCOUNTER — Encounter (HOSPITAL_COMMUNITY): Payer: Self-pay | Admitting: *Deleted

## 2016-05-29 ENCOUNTER — Encounter (HOSPITAL_COMMUNITY): Payer: PPO

## 2016-05-29 ENCOUNTER — Encounter (HOSPITAL_COMMUNITY)
Admission: RE | Disposition: A | Discharge status: Home / Self Care | Payer: Self-pay | Source: Ambulatory Visit | Attending: General Surgery

## 2016-05-29 ENCOUNTER — Ambulatory Visit (HOSPITAL_COMMUNITY)
Admission: RE | Admit: 2016-05-29 | Discharge: 2016-05-29 | Disposition: A | Discharge status: Home / Self Care | Payer: PPO | Source: Ambulatory Visit | Attending: General Surgery | Admitting: General Surgery

## 2016-05-29 DIAGNOSIS — Z79899 Other long term (current) drug therapy: Secondary | ICD-10-CM | POA: Diagnosis not present

## 2016-05-29 DIAGNOSIS — C50912 Malignant neoplasm of unspecified site of left female breast: Secondary | ICD-10-CM | POA: Diagnosis not present

## 2016-05-29 DIAGNOSIS — F17210 Nicotine dependence, cigarettes, uncomplicated: Secondary | ICD-10-CM | POA: Diagnosis not present

## 2016-05-29 DIAGNOSIS — Z170 Estrogen receptor positive status [ER+]: Principal | ICD-10-CM

## 2016-05-29 DIAGNOSIS — Z803 Family history of malignant neoplasm of breast: Secondary | ICD-10-CM | POA: Diagnosis not present

## 2016-05-29 DIAGNOSIS — J309 Allergic rhinitis, unspecified: Secondary | ICD-10-CM | POA: Diagnosis not present

## 2016-05-29 DIAGNOSIS — C50012 Malignant neoplasm of nipple and areola, left female breast: Secondary | ICD-10-CM

## 2016-05-29 DIAGNOSIS — F329 Major depressive disorder, single episode, unspecified: Secondary | ICD-10-CM | POA: Diagnosis not present

## 2016-05-29 DIAGNOSIS — C773 Secondary and unspecified malignant neoplasm of axilla and upper limb lymph nodes: Secondary | ICD-10-CM | POA: Diagnosis not present

## 2016-05-29 DIAGNOSIS — G629 Polyneuropathy, unspecified: Secondary | ICD-10-CM | POA: Diagnosis not present

## 2016-05-29 HISTORY — PX: SENTINEL NODE BIOPSY: SHX6608

## 2016-05-29 SURGERY — BIOPSY, LYMPH NODE, SENTINEL
Anesthesia: General | Site: Breast | Laterality: Left

## 2016-05-29 MED ORDER — 0.9 % SODIUM CHLORIDE (POUR BTL) OPTIME
TOPICAL | Status: DC | PRN
  Administered 2016-05-29: 1000 mL

## 2016-05-29 MED ORDER — HYDROMORPHONE HCL 1 MG/ML IJ SOLN
0.2500 mg | INTRAMUSCULAR | Status: DC | PRN
  Administered 2016-05-29: 0.5 mg via INTRAVENOUS

## 2016-05-29 MED ORDER — CEFAZOLIN SODIUM-DEXTROSE 2-4 GM/100ML-% IV SOLN
2.0000 g | INTRAVENOUS | Status: AC
  Administered 2016-05-29: 2 g via INTRAVENOUS
  Filled 2016-05-29: qty 100

## 2016-05-29 MED ORDER — MIDAZOLAM HCL 2 MG/2ML IJ SOLN
INTRAMUSCULAR | Status: AC
  Filled 2016-05-29: qty 2

## 2016-05-29 MED ORDER — CHLORHEXIDINE GLUCONATE CLOTH 2 % EX PADS: 6.0000 | MEDICATED_PAD | Freq: Once | CUTANEOUS | Status: DC

## 2016-05-29 MED ORDER — MIDAZOLAM HCL 5 MG/5ML IJ SOLN
INTRAMUSCULAR | Status: DC | PRN
  Administered 2016-05-29: 2 mg via INTRAVENOUS

## 2016-05-29 MED ORDER — FENTANYL CITRATE (PF) 100 MCG/2ML IJ SOLN
INTRAMUSCULAR | Status: AC
  Administered 2016-05-29: 100 ug via INTRAVENOUS
  Filled 2016-05-29: qty 2

## 2016-05-29 MED ORDER — METHYLENE BLUE 0.5 % INJ SOLN
INTRAVENOUS | Status: AC
  Filled 2016-05-29: qty 10

## 2016-05-29 MED ORDER — PROPOFOL 10 MG/ML IV BOLUS
INTRAVENOUS | Status: DC | PRN
  Administered 2016-05-29: 170 mg via INTRAVENOUS

## 2016-05-29 MED ORDER — DEXAMETHASONE SODIUM PHOSPHATE 10 MG/ML IJ SOLN
INTRAMUSCULAR | Status: DC | PRN
  Administered 2016-05-29: 10 mg via INTRAVENOUS

## 2016-05-29 MED ORDER — LIDOCAINE HCL 1 % IJ SOLN
INTRAMUSCULAR | Status: AC
  Filled 2016-05-29: qty 20

## 2016-05-29 MED ORDER — OXYCODONE HCL 5 MG PO TABS
ORAL_TABLET | ORAL | Status: AC
  Filled 2016-05-29: qty 1

## 2016-05-29 MED ORDER — METHYLENE BLUE 0.5 % INJ SOLN
INTRAVENOUS | Status: DC | PRN
  Administered 2016-05-29: 5 mL via INTRAMUSCULAR

## 2016-05-29 MED ORDER — PROMETHAZINE HCL 25 MG/ML IJ SOLN: 6.2500 mg | INTRAMUSCULAR | Status: DC | PRN

## 2016-05-29 MED ORDER — ONDANSETRON HCL 4 MG/2ML IJ SOLN
INTRAMUSCULAR | Status: AC
  Filled 2016-05-29: qty 2

## 2016-05-29 MED ORDER — LIDOCAINE HCL (CARDIAC) 20 MG/ML IV SOLN
INTRAVENOUS | Status: DC | PRN
  Administered 2016-05-29: 80 mg via INTRAVENOUS

## 2016-05-29 MED ORDER — SODIUM CHLORIDE 0.9 % IJ SOLN
INTRAMUSCULAR | Status: AC
  Filled 2016-05-29: qty 10

## 2016-05-29 MED ORDER — PROPOFOL 10 MG/ML IV BOLUS
INTRAVENOUS | Status: AC
  Filled 2016-05-29: qty 20

## 2016-05-29 MED ORDER — LIDOCAINE HCL 1 % IJ SOLN
INTRAMUSCULAR | Status: DC | PRN
  Administered 2016-05-29: 20 mL

## 2016-05-29 MED ORDER — TECHNETIUM TC 99M SULFUR COLLOID FILTERED
1.0000 | Freq: Once | INTRAVENOUS | Status: AC | PRN
  Administered 2016-05-29: 1 via INTRADERMAL

## 2016-05-29 MED ORDER — PHENYLEPHRINE HCL 10 MG/ML IJ SOLN
INTRAMUSCULAR | Status: DC | PRN
  Administered 2016-05-29: 120 ug via INTRAVENOUS

## 2016-05-29 MED ORDER — PHENYLEPHRINE 40 MCG/ML (10ML) SYRINGE FOR IV PUSH (FOR BLOOD PRESSURE SUPPORT)
PREFILLED_SYRINGE | INTRAVENOUS | Status: AC
  Filled 2016-05-29: qty 10

## 2016-05-29 MED ORDER — FENTANYL CITRATE (PF) 250 MCG/5ML IJ SOLN
INTRAMUSCULAR | Status: AC
  Filled 2016-05-29: qty 5

## 2016-05-29 MED ORDER — FENTANYL CITRATE (PF) 100 MCG/2ML IJ SOLN
INTRAMUSCULAR | Status: DC | PRN
  Administered 2016-05-29 (×5): 50 ug via INTRAVENOUS

## 2016-05-29 MED ORDER — FENTANYL CITRATE (PF) 100 MCG/2ML IJ SOLN
100.0000 ug | Freq: Once | INTRAMUSCULAR | Status: AC
  Administered 2016-05-29: 100 ug via INTRAVENOUS

## 2016-05-29 MED ORDER — ONDANSETRON HCL 4 MG/2ML IJ SOLN
INTRAMUSCULAR | Status: DC | PRN
  Administered 2016-05-29: 4 mg via INTRAVENOUS

## 2016-05-29 MED ORDER — ACETAMINOPHEN 500 MG PO TABS
ORAL_TABLET | ORAL | Status: AC
  Administered 2016-05-29: 1000 mg via ORAL
  Filled 2016-05-29: qty 2

## 2016-05-29 MED ORDER — DEXAMETHASONE SODIUM PHOSPHATE 10 MG/ML IJ SOLN
INTRAMUSCULAR | Status: AC
  Filled 2016-05-29: qty 1

## 2016-05-29 MED ORDER — ACETAMINOPHEN 500 MG PO TABS
1000.0000 mg | ORAL_TABLET | Freq: Once | ORAL | Status: AC
  Administered 2016-05-29: 1000 mg via ORAL

## 2016-05-29 MED ORDER — LACTATED RINGERS IV SOLN
INTRAVENOUS | Status: DC
  Administered 2016-05-29 (×2): via INTRAVENOUS

## 2016-05-29 MED ORDER — BUPIVACAINE HCL (PF) 0.25 % IJ SOLN
INTRAMUSCULAR | Status: AC
  Filled 2016-05-29: qty 30

## 2016-05-29 MED ORDER — BUPIVACAINE HCL (PF) 0.25 % IJ SOLN
INTRAMUSCULAR | Status: DC | PRN
  Administered 2016-05-29: 20 mL

## 2016-05-29 MED ORDER — OXYCODONE HCL 5 MG PO TABS
5.0000 mg | ORAL_TABLET | Freq: Once | ORAL | Status: AC
  Administered 2016-05-29: 5 mg via ORAL

## 2016-05-29 MED ORDER — HYDROMORPHONE HCL 1 MG/ML IJ SOLN
INTRAMUSCULAR | Status: AC
  Filled 2016-05-29: qty 0.5

## 2016-05-29 MED ORDER — OXYCODONE HCL 5 MG PO TABS: 5.0000 mg | ORAL_TABLET | Freq: Four times a day (QID) | ORAL | 0 refills | Status: DC | PRN

## 2016-05-29 SURGICAL SUPPLY — 44 items
BNDG COHESIVE 4X5 TAN STRL (GAUZE/BANDAGES/DRESSINGS) ×3 IMPLANT
CANISTER SUCT 3000ML PPV (MISCELLANEOUS) ×3 IMPLANT
CHLORAPREP W/TINT 10.5 ML (MISCELLANEOUS) ×3 IMPLANT
CLIP TI MEDIUM 6 (CLIP) ×9 IMPLANT
CONT SPEC 4OZ CLIKSEAL STRL BL (MISCELLANEOUS) ×15 IMPLANT
COVER MAYO STAND STRL (DRAPES) ×3 IMPLANT
COVER PROBE W GEL 5X96 (DRAPES) ×3 IMPLANT
COVER SURGICAL LIGHT HANDLE (MISCELLANEOUS) ×3 IMPLANT
DERMABOND ADVANCED (GAUZE/BANDAGES/DRESSINGS) ×2
DERMABOND ADVANCED .7 DNX12 (GAUZE/BANDAGES/DRESSINGS) ×1 IMPLANT
DRAPE CHEST BREAST 15X10 FENES (DRAPES) ×3 IMPLANT
DRAPE UTILITY XL STRL (DRAPES) ×3 IMPLANT
ELECT CAUTERY BLADE 6.4 (BLADE) ×3 IMPLANT
ELECT COATED BLADE 2.86 ST (ELECTRODE) ×3 IMPLANT
ELECT REM PT RETURN 9FT ADLT (ELECTROSURGICAL) ×3
ELECTRODE REM PT RTRN 9FT ADLT (ELECTROSURGICAL) ×1 IMPLANT
GAUZE SPONGE 4X4 16PLY XRAY LF (GAUZE/BANDAGES/DRESSINGS) ×3 IMPLANT
GLOVE BIO SURGEON STRL SZ 6 (GLOVE) ×3 IMPLANT
GLOVE BIOGEL PI IND STRL 6.5 (GLOVE) ×1 IMPLANT
GLOVE BIOGEL PI INDICATOR 6.5 (GLOVE) ×2
GLOVE SURG SS PI 8.0 STRL IVOR (GLOVE) ×3 IMPLANT
GOWN STRL REUS W/ TWL LRG LVL3 (GOWN DISPOSABLE) ×1 IMPLANT
GOWN STRL REUS W/TWL 2XL LVL3 (GOWN DISPOSABLE) ×6 IMPLANT
GOWN STRL REUS W/TWL LRG LVL3 (GOWN DISPOSABLE) ×2
KIT BASIN OR (CUSTOM PROCEDURE TRAY) ×3 IMPLANT
KIT ROOM TURNOVER OR (KITS) ×3 IMPLANT
LIGHT WAVEGUIDE WIDE FLAT (MISCELLANEOUS) ×3 IMPLANT
NEEDLE 18GX1X1/2 (RX/OR ONLY) (NEEDLE) ×3 IMPLANT
NEEDLE FILTER BLUNT 18X 1/2SAF (NEEDLE) ×2
NEEDLE FILTER BLUNT 18X1 1/2 (NEEDLE) ×1 IMPLANT
NEEDLE HYPO 25GX1X1/2 BEV (NEEDLE) ×6 IMPLANT
NS IRRIG 1000ML POUR BTL (IV SOLUTION) ×3 IMPLANT
PACK SURGICAL SETUP 50X90 (CUSTOM PROCEDURE TRAY) ×3 IMPLANT
PAD ARMBOARD 7.5X6 YLW CONV (MISCELLANEOUS) ×3 IMPLANT
PENCIL BUTTON HOLSTER BLD 10FT (ELECTRODE) ×3 IMPLANT
STOCKINETTE IMPERVIOUS 9X36 MD (GAUZE/BANDAGES/DRESSINGS) ×3 IMPLANT
SUT MON AB 4-0 PC3 18 (SUTURE) ×3 IMPLANT
SUT VIC AB 3-0 SH 27 (SUTURE) ×2
SUT VIC AB 3-0 SH 27X BRD (SUTURE) ×1 IMPLANT
SYR BULB 3OZ (MISCELLANEOUS) ×3 IMPLANT
SYR CONTROL 10ML LL (SYRINGE) ×6 IMPLANT
TOWEL OR 17X26 10 PK STRL BLUE (TOWEL DISPOSABLE) ×3 IMPLANT
TUBE CONNECTING 12'X1/4 (SUCTIONS) ×1
TUBE CONNECTING 12X1/4 (SUCTIONS) ×2 IMPLANT

## 2016-05-29 NOTE — Op Note (Signed)
Left sentinel lymph node biopsy  Indications: This patient presents with history of left breast cancer  Pre-operative Diagnosis:  Left breast cancer, need for staging.    Post-operative Diagnosis: same.    Surgeon: Stark Klein   Anesthesia: General endotracheal anesthesia  ASA Class: 2  Procedure Details  The patient was seen in the Holding Room. The risks, benefits, complications, treatment options, and expected outcomes were discussed with the patient. The possibilities of bleeding, infection, the need for additional procedures, failure to diagnose a condition, and creating a complication requiring transfusion or operation were discussed with the patient. The patient concurred with the proposed plan, giving informed consent.  The site of surgery properly noted/marked. The patient was taken to Operating Room # 2, identified, and the procedure verified as Left axillary sentinel lymph node biopsy. A Time Out was held and the above information confirmed.  The left arm, breast, and chest were prepped and draped in standard fashion.Using a hand-held gamma probe, left axillary sentinel nodes were identified transcutaneously.  An oblique incision was created below the axillary hairline.  Dissection was carried through the clavipectoral fascia.  Four deep axillary (level 2) sentinel nodes were removed. Lymphovascular channels were clipped with blue hemaclips.   Counts per second were 940, 30, 50, and 130.    The background count was 15 cps.  The wound was irrigated.  Hemostasis was achieved with cautery.  The axillary incision was closed with a 3-0 vicryl deep dermal interrupted sutures and a 4-0 monocryl subcuticular closure.    Sterile dressings were applied. At the end of the operation, all sponge, instrument, and needle counts were correct.  Findings: grossly clear surgical margins and no adenopathy  Estimated Blood Loss:  min         Specimens:  Four deep axillary sentinel lymph nodes.             Complications:  None; patient tolerated the procedure well.         Disposition: PACU - hemodynamically stable.         Condition: stable

## 2016-05-29 NOTE — Anesthesia Procedure Notes (Signed)
Procedure Name: LMA Insertion Date/Time: 05/29/2016 11:52 AM Performed by: Lance Coon Pre-anesthesia Checklist: Emergency Drugs available, Patient identified, Suction available, Patient being monitored and Timeout performed Patient Re-evaluated:Patient Re-evaluated prior to inductionOxygen Delivery Method: Circle system utilized Preoxygenation: Pre-oxygenation with 100% oxygen Intubation Type: IV induction LMA: LMA inserted LMA Size: 4.0 Number of attempts: 1 Placement Confirmation: breath sounds checked- equal and bilateral and positive ETCO2 Tube secured with: Tape Dental Injury: Teeth and Oropharynx as per pre-operative assessment

## 2016-05-29 NOTE — Transfer of Care (Signed)
Immediate Anesthesia Transfer of Care Note  Patient: Ashley Cross  Procedure(s) Performed: Procedure(s): LEFT SENTINEL LYMPH NODE BIOPSY (Left)  Patient Location: PACU  Anesthesia Type:General  Level of Consciousness: awake, alert , oriented and patient cooperative  Airway & Oxygen Therapy: Patient Spontanous Breathing  Post-op Assessment: Report given to RN and Post -op Vital signs reviewed and stable  Post vital signs: Reviewed and stable  Last Vitals:  Vitals:   05/29/16 1006  BP: (!) 154/64  Pulse: (!) 108  Resp: 20  Temp: 36.6 C    Last Pain:  Vitals:   05/29/16 1015  TempSrc:   PainSc: 3          Complications: No apparent anesthesia complications

## 2016-05-29 NOTE — Anesthesia Postprocedure Evaluation (Addendum)
Anesthesia Post Note  Patient: Ashley Cross  Procedure(s) Performed: Procedure(s) (LRB): LEFT SENTINEL LYMPH NODE BIOPSY (Left)  Patient location during evaluation: PACU Anesthesia Type: General Level of consciousness: awake and alert Pain management: pain level controlled Vital Signs Assessment: post-procedure vital signs reviewed and stable Respiratory status: spontaneous breathing, nonlabored ventilation, respiratory function stable and patient connected to nasal cannula oxygen Cardiovascular status: blood pressure returned to baseline and stable Postop Assessment: no signs of nausea or vomiting Anesthetic complications: no       Last Vitals:  Vitals:   05/29/16 1332 05/29/16 1343  BP: 130/77 (!) 151/73  Pulse:  (!) 104  Resp: 16 16  Temp: 36.7 C     Last Pain:  Vitals:   05/29/16 1332  TempSrc:   PainSc: 3                  France Noyce,JAMES TERRILL

## 2016-05-29 NOTE — Progress Notes (Signed)
Per Dr. Barry Dienes for ERAS protocol only request acetaminophen.

## 2016-05-29 NOTE — Interval H&P Note (Signed)
History and Physical Interval Note:  05/29/2016 11:25 AM  Ashley Cross  has presented today for surgery, with the diagnosis of LEFT BREAST CANCER  The various methods of treatment have been discussed with the patient and family. After consideration of risks, benefits and other options for treatment, the patient has consented to  Procedure(s): LEFT SENTINEL LYMPH NODE BIOPSY (Left) as a surgical intervention .  The patient's history has been reviewed, patient examined, no change in status, stable for surgery.  I have reviewed the patient's chart and labs.  Questions were answered to the patient's satisfaction.     Crislyn Willbanks

## 2016-05-29 NOTE — Anesthesia Preprocedure Evaluation (Signed)
Anesthesia Evaluation  Patient identified by MRN, date of birth, ID band Patient awake    Reviewed: Allergy & Precautions, NPO status , Patient's Chart, lab work & pertinent test results  Airway Mallampati: II   Neck ROM: Full    Dental no notable dental hx.    Pulmonary neg pulmonary ROS, Current Smoker,    breath sounds clear to auscultation       Cardiovascular negative cardio ROS   Rhythm:Regular Rate:Normal     Neuro/Psych  Neuromuscular disease negative psych ROS   GI/Hepatic negative GI ROS, Neg liver ROS,   Endo/Other  negative endocrine ROS  Renal/GU negative Renal ROS  negative genitourinary   Musculoskeletal negative musculoskeletal ROS (+)   Abdominal   Peds negative pediatric ROS (+)  Hematology negative hematology ROS (+)   Anesthesia Other Findings   Reproductive/Obstetrics negative OB ROS                             Anesthesia Physical Anesthesia Plan  ASA: II  Anesthesia Plan: General   Post-op Pain Management:    Induction: Intravenous  Airway Management Planned: LMA  Additional Equipment:   Intra-op Plan:   Post-operative Plan: Extubation in OR  Informed Consent: I have reviewed the patients History and Physical, chart, labs and discussed the procedure including the risks, benefits and alternatives for the proposed anesthesia with the patient or authorized representative who has indicated his/her understanding and acceptance.   Dental advisory given  Plan Discussed with: CRNA  Anesthesia Plan Comments:         Anesthesia Quick Evaluation
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
## 2016-05-29 NOTE — Progress Notes (Signed)
Patient monitored post fentanyl administration given for sentinel lymph node injection. Saturations 100% throughout.

## 2016-05-30 ENCOUNTER — Encounter (HOSPITAL_COMMUNITY): Payer: Self-pay | Admitting: General Surgery

## 2016-05-31 ENCOUNTER — Telehealth: Payer: Self-pay | Admitting: General Surgery

## 2016-05-31 ENCOUNTER — Telehealth: Payer: Self-pay | Admitting: *Deleted

## 2016-05-31 NOTE — Telephone Encounter (Signed)
Called about pathology.  Left message on machine.  Discussed with [name].  His office will set up follow up appt and likely send mammprint to determine if chemo recommended.

## 2016-05-31 NOTE — Telephone Encounter (Signed)
Received order per [name] for mammaprint testing. Spoke to pt regarding this testing and scheduled and confirmed appt to see [name] on 4/30 at 2pm. Discussed lymadema prevention. Encourage pt to call with further questions or needs. Received verbal understanding.

## 2016-06-04 ENCOUNTER — Ambulatory Visit (HOSPITAL_BASED_OUTPATIENT_CLINIC_OR_DEPARTMENT_OTHER): Payer: PPO | Admitting: Hematology and Oncology

## 2016-06-04 DIAGNOSIS — Z170 Estrogen receptor positive status [ER+]: Secondary | ICD-10-CM

## 2016-06-04 DIAGNOSIS — C50512 Malignant neoplasm of lower-outer quadrant of left female breast: Secondary | ICD-10-CM | POA: Diagnosis not present

## 2016-06-04 NOTE — Assessment & Plan Note (Signed)
04/12/2016 Left breast biopsy/ lumpectomy: IDC grade 10.8 cm with DCIS grade 1, margins negative, ER 100%, PR 100%, HER-2 negative, Ki-67 15%  PET/CT scan: Negative for metastatic disease Sentinel lymph node study: 1/4 sentinel nodes positive with extracapsular extension  Recommendation: 1. Mammaprint testing to determine if she needs chemotherapy 2. Followed by adjuvant radiation 3. Followed by adjuvant antiestrogen therapy for 7 years  Return to clinic based upon Mammaprint test results.

## 2016-06-04 NOTE — Progress Notes (Signed)
Patient Care Team: [name], MD as PCP - General (Internal Medicine) Perrin Maltese, MD (Internal Medicine)  DIAGNOSIS:  Encounter Diagnosis  Name Primary?  . Malignant neoplasm of lower-outer quadrant of left breast of female, estrogen receptor positive ([place])     SUMMARY OF ONCOLOGIC HISTORY:   Malignant neoplasm of lower-outer quadrant of left breast of female, estrogen receptor positive ([place])   04/12/2016 Initial Diagnosis    Left breast biopsy/ lumpectomy: IDC grade 1, 0.8 cm with DCIS grade 1, margins negative, ER 100%, PR 100%, HER-2 negative, Ki-67 15%, T1b NX stage I a      05/21/2016 PET scan    Mild focal hypermetabolic in the left breast, no findings of metastatic disease. mild hypermetabolism is along the right costosternal junction favoring degenerative changes posttraumatic changes involving the right inferior scapular right sacrum bilateral pelvis and right lateral third and fourth ribs      05/29/2016 Surgery    1/4 sentinel lymph node positive with extracapsular extension        CHIEF COMPLIANT: Follow-up after recent lymph node surgery  INTERVAL HISTORY: Ashley Cross is a 61 year old with above-mentioned history of left breast cancer who underwent recent sentinel lymph node study is here today to discuss the pathology report. She has a lot of pain in the axilla and Saturday her pain got really bad but today it is much better. She tells me that her daughter age 51 has a problem with narcotics and she tries to steal them from her. She's been trying to hide them and use them very sparingly.  REVIEW OF SYSTEMS:   Constitutional: Denies fevers, chills or abnormal weight loss Eyes: Denies blurriness of vision Ears, nose, mouth, throat, and face: Denies mucositis or sore throat Respiratory: Denies cough, dyspnea or wheezes Cardiovascular: Denies palpitation, chest discomfort Gastrointestinal:  Denies nausea, heartburn or change in bowel  habits Skin: Denies abnormal skin rashes Lymphatics: Denies new lymphadenopathy or easy bruising Neurological:Denies numbness, tingling or new weaknesses Behavioral/Psych: Mood is stable, no new changes  Extremities: No lower extremity edema Breast:  denies any pain or lumps or nodules in either breasts All other systems were reviewed with the patient and are negative.  I have reviewed the past medical history, past surgical history, social history and family history with the patient and they are unchanged from previous note.  ALLERGIES:  is allergic to sulfa antibiotics and sulfonamide derivatives.  MEDICATIONS:  Current Outpatient Prescriptions  Medication Sig Dispense Refill  . amitriptyline (ELAVIL) 50 MG tablet Take 50 mg by mouth at bedtime.    . diazepam (VALIUM) 2 MG tablet Take 2 mg by mouth at bedtime as needed for muscle spasms or anxiety.    . gabapentin (NEURONTIN) 600 MG tablet Take 1,200 mg by mouth 3 (three) times daily.    Marland Kitchen ibuprofen (ADVIL,MOTRIN) 200 MG tablet Take 800 mg by mouth every 8 (eight) hours as needed (for pain).     Marland Kitchen oxyCODONE (OXY IR/ROXICODONE) 5 MG immediate release tablet Take 1-2 tablets (5-10 mg total) by mouth every 6 (six) hours as needed for moderate pain, severe pain or breakthrough pain. 30 tablet 0  . oxymorphone (OPANA) 10 MG tablet Take 10 mg by mouth every 4 (four) hours as needed for pain.    . traZODone (DESYREL) 100 MG tablet Take 50 mg by mouth at bedtime as needed for sleep.    . Vitamin D, Ergocalciferol, (DRISDOL) 50000 units CAPS capsule Take 50,000 Units by mouth  every Monday.      No current facility-administered medications for this visit.     PHYSICAL EXAMINATION: ECOG PERFORMANCE STATUS: 1 - Symptomatic but completely ambulatory  Vitals:   06/04/16 1427  BP: (!) 146/85  Pulse: (!) 101  Resp: 18  Temp: 97.9 F (36.6 C)   Filed Weights   06/04/16 1427  Weight: 128 lb 12.8 oz (58.4 kg)    GENERAL:alert, no distress  and comfortable SKIN: skin color, texture, turgor are normal, no rashes or significant lesions EYES: normal, Conjunctiva are pink and non-injected, sclera clear OROPHARYNX:no exudate, no erythema and lips, buccal mucosa, and tongue normal  NECK: supple, thyroid normal size, non-tender, without nodularity LYMPH:  no palpable lymphadenopathy in the cervical, axillary or inguinal LUNGS: clear to auscultation and percussion with normal breathing effort HEART: regular rate & rhythm and no murmurs and no lower extremity edema ABDOMEN:abdomen soft, non-tender and normal bowel sounds MUSCULOSKELETAL:no cyanosis of digits and no clubbing  NEURO: alert & oriented x 3 with fluent speech, no focal motor/sensory deficits EXTREMITIES: No lower extremity edema  LABORATORY DATA:  I have reviewed the data as listed   Chemistry      Component Value Date/Time   NA 138 05/16/2016 1025   K 4.4 05/16/2016 1025   CL 101 05/16/2016 1025   CO2 27 05/16/2016 1025   BUN 8 05/16/2016 1025   CREATININE 0.52 05/16/2016 1025      Component Value Date/Time   CALCIUM 9.7 05/16/2016 1025   ALKPHOS 66 01/29/2016 2126   AST 69 (H) 01/29/2016 2126   ALT 34 01/29/2016 2126   BILITOT 0.4 01/29/2016 2126       Lab Results  Component Value Date   WBC 6.0 05/16/2016   HGB 14.7 05/16/2016   HCT 45.0 05/16/2016   MCV 94.3 05/16/2016   PLT 227 05/16/2016   NEUTROABS 6.3 01/31/2016    ASSESSMENT & PLAN:  Malignant neoplasm of lower-outer quadrant of left breast of female, estrogen receptor positive ([place]) 04/12/2016 Left breast biopsy/ lumpectomy: IDC grade 10.8 cm with DCIS grade 1, margins negative, ER 100%, PR 100%, HER-2 negative, Ki-67 15%  PET/CT scan: Negative for metastatic disease Sentinel lymph node study: 1/4 sentinel nodes positive with extracapsular extension  Recommendation: 1. Mammaprint testing to determine if she needs chemotherapy 2. Followed by adjuvant radiation 3. Followed by adjuvant  antiestrogen therapy for 7 years  Mammaprint counseling: MINDACT is a prospective, randomized phase III controlled trial that investigates the clinical utility of MammaPrint, when compared to standard clinical pathological criteria, with 6,693 patients enrolled from over 111 institutions. Clinical high-risk patients with a Low Risk MammaPrint result, including 48% node-positive, had 5-year distant metastasis-free survival rate in excess of 94 percent, whether randomized to receive adjuvant chemotherapy or not proving MammaPrint's ability to safely identify Low Risk patients.  Return to clinic based upon Mammaprint test results.    I spent 25 minutes talking to the patient of which more than half was spent in counseling and coordination of care.  No orders of the defined types were placed in this encounter.  The patient has a good understanding of the overall plan. she agrees with it. she will call with any problems that may develop before the next visit here.   Rulon Eisenmenger, MD 06/04/16

## 2016-06-05 ENCOUNTER — Telehealth: Payer: Self-pay | Admitting: *Deleted

## 2016-06-05 ENCOUNTER — Encounter (HOSPITAL_COMMUNITY): Payer: Self-pay

## 2016-06-05 NOTE — Telephone Encounter (Signed)
Received mammaprint results of LOW RISK. Physician team notified.  Called pt to inform of results and she that she will not need chemotherapy. Confirmed appt with Dr. Lisbeth Renshaw for xrt. Denies questions or needs at this time

## 2016-06-11 ENCOUNTER — Ambulatory Visit: Payer: PPO

## 2016-06-11 ENCOUNTER — Ambulatory Visit: Admission: RE | Admit: 2016-06-11 | Payer: PPO | Source: Ambulatory Visit | Admitting: Radiation Oncology

## 2016-06-12 NOTE — Progress Notes (Addendum)
Ashley Cross 61 y.o. Woman with  Left Breast (nipple mass) Lower Outer Quadrant FUN after Left sentinel lymph node biopsy 05-29-16 and 05-21-16 PETscan PET/CT scan: Negative for metastatic disease Sentinel lymph node study: 1/4 sentinel nodes positive with extracapsular extension.   Location of Breast Cancer: Left Breast (nipple mass) Lower Outer Quadrant  Diagnosis 05-29-16 1. Lymph node, sentinel, biopsy, Left axillary #1 - ONE LYMPH NODE POSITIVE FOR METASTATIC DUCTAL CARCINOMA (1/1). - EXTRACAPSULAR EXTENSION IS PRESENT. - SEE COMMENT. 2. Lymph node, sentinel, biopsy, Left axillary #2 - ONE LYMPH NODE NEGATIVE FOR METASTATIC CARCINOMA (0/1). 3. Lymph node, sentinel, biopsy, Left axillary #3 - ONE LYMPH NODE NEGATIVE FOR METASTATIC CARCINOMA (0/1). 4. Lymph node, sentinel, biopsy, Left axillary #4 - ONE LYMPH NODE NEGATIVE FOR METASTATIC CARCINOMA (0/1).   Histology per Pathology Report: Diagnosis 04/12/2016: Nipple Biopsy, left INVASIVE DUCTAL CARCINOMA, GRADE 1, 0.8 CM (PT1B)DUCTAL CARCINOMA IN SITU IS IDENTIFIED, GRADE 1MARGINS OF RESECTION ARE NEGATIVE FOR CARCINOMA  Receptor Status: ER(  100%+ ), PR (  100%+ ), Her2-neu (neg, ratio 1.39 ), Ki-67(15%)  Did patient present with symptoms (if so, please note symptoms) or was this found on screening mammography?: routine mammogram, but patient felt and  knew of mass on nipple, for about a year   Past/Anticipated interventions by surgeon, if any: 05-29-16 Left sentinel lymph node biopsy [name], [name], MD seen this past Monday 04/23/16 no notes, will scheule an MRI , not in Epic as yet  Past/Anticipated interventions by medical oncology, if any: Chemotherapy :06-04-16 [name]  Mammaprint testing to determine if she needs chemotherapy  Followed by adjuvant radiation  Followed by adjuvant antiestrogen therapy for 7 years, 05-21-16 [name] PET scan   [name] seen 04/20/16:No oncotypte testing as   < 1cm and being grade 1, after radiation, adjuvant antiestrogen therapy with letrozole 2.106m oral  daily x 5 years  Lymphedema issues, if any:  NO  Pain issues, if any:5/10 Left breast  Taking Oxycomorphone  05-09--18 Shooting pains at times in left breast due to activity ongoing Skin to left axilla bruised with small amount of brownish red drainage and swelling to the site, tender with pain with movement. Has an appointment to see [name] evening at 1630. SAFETY ISSUES: NO  Prior radiation? NO  Pacemaker/ICD? NO  Possible current pregnancy? NO  Is the patient on methotrexate? NO   Current Complaints / other details:  Menarche age age 61 [id number],  Single; depression, current cigarette smoker,  1ppd x 30 years, moderate alcohol use, drug use=opana since January 2018; ,or smokelss tobcco Mother Breast cancer, Father prostate cancer,htn,colon polyps Wt Readings from Last 3 Encounters:  06/13/16 130 lb 9.6 oz (59.2 kg)  06/04/16 128 lb 12.8 oz (58.4 kg)  05/16/16 129 lb 4.8 oz (58.7 kg)  BP (!) 158/78   Pulse 95   Temp 98.3 F (36.8 C) (Oral)   Resp 16   Ht 5' 6"  (1.676 m)   Wt 130 lb 9.6 oz (59.2 kg)   SpO2 100%   BMI 21.08 kg/m

## 2016-06-13 ENCOUNTER — Encounter: Payer: Self-pay | Admitting: Radiation Oncology

## 2016-06-13 ENCOUNTER — Ambulatory Visit
Admission: RE | Admit: 2016-06-13 | Discharge: 2016-06-13 | Disposition: A | Discharge status: Home / Self Care | Payer: PPO | Source: Ambulatory Visit | Attending: Radiation Oncology | Admitting: Radiation Oncology

## 2016-06-13 VITALS — BP 158/78 | HR 95 | Temp 98.3°F | Resp 16 | Ht 66.0 in | Wt 130.6 lb

## 2016-06-13 DIAGNOSIS — Z510 Encounter for antineoplastic radiation therapy: Secondary | ICD-10-CM | POA: Diagnosis not present

## 2016-06-13 DIAGNOSIS — Z170 Estrogen receptor positive status [ER+]: Secondary | ICD-10-CM | POA: Diagnosis not present

## 2016-06-13 DIAGNOSIS — C773 Secondary and unspecified malignant neoplasm of axilla and upper limb lymph nodes: Secondary | ICD-10-CM | POA: Diagnosis not present

## 2016-06-13 DIAGNOSIS — C50512 Malignant neoplasm of lower-outer quadrant of left female breast: Secondary | ICD-10-CM | POA: Diagnosis not present

## 2016-06-13 DIAGNOSIS — L7621 Postprocedural hemorrhage and hematoma of skin and subcutaneous tissue following a dermatologic procedure: Secondary | ICD-10-CM | POA: Diagnosis not present

## 2016-06-13 NOTE — Addendum Note (Signed)
Encounter addended by: Malena Edman, RN on: 06/13/2016  2:53 PM<BR>    Actions taken: Charge Capture section accepted
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
## 2016-06-19 DIAGNOSIS — Z5181 Encounter for therapeutic drug level monitoring: Secondary | ICD-10-CM | POA: Diagnosis not present

## 2016-06-19 DIAGNOSIS — Z79899 Other long term (current) drug therapy: Secondary | ICD-10-CM | POA: Diagnosis not present

## 2016-06-19 DIAGNOSIS — Z79891 Long term (current) use of opiate analgesic: Secondary | ICD-10-CM | POA: Diagnosis not present

## 2016-06-19 DIAGNOSIS — G5621 Lesion of ulnar nerve, right upper limb: Secondary | ICD-10-CM | POA: Diagnosis not present

## 2016-06-19 DIAGNOSIS — G8911 Acute pain due to trauma: Secondary | ICD-10-CM | POA: Diagnosis not present

## 2016-06-19 DIAGNOSIS — M961 Postlaminectomy syndrome, not elsewhere classified: Secondary | ICD-10-CM | POA: Diagnosis not present

## 2016-06-27 ENCOUNTER — Ambulatory Visit: Admission: RE | Admit: 2016-06-27 | Payer: PPO | Source: Ambulatory Visit | Admitting: Radiation Oncology

## 2016-06-29 ENCOUNTER — Other Ambulatory Visit: Payer: Self-pay

## 2016-06-29 NOTE — Patient Outreach (Signed)
[facility]) Care Management  06/29/2016  Ashley Cross 1955-08-24 [id number]  TELEPHONE SCREENING Referral date: 06/27/16 Referral source: Health team advantage concierge Referral reason: Previous breast cancer surgeries, several needs, last discharge from the hospital 05/29/16 Insurance: Health team advantage Attempt #1  Telephone call to patient regarding health team advantage referral. Unable to reach patient. HIPAA compliant voice message left with call back phone number.    PROVIDERS:   SOCIAL/ SUPPORTIVE CARE:    PLAN: RNCM will attempt 2nd telephone call to patient 3 business days.  Quinn Plowman RN,BSN,CCM [facility] Telephonic  [phone]

## 2016-07-03 ENCOUNTER — Other Ambulatory Visit: Payer: Self-pay

## 2016-07-03 NOTE — Patient Outreach (Signed)
[facility]) Care Management  07/03/2016  Dynasty Holquin 04/27/1955 [id number]   TELEPHONE SCREENING Referral date: 06/27/16 Referral source: Health team advantage concierge Referral reason: Previous breast cancer surgeries, several needs, last discharge from the hospital 05/29/16 Insurance: Health team advantage Attempt #2  Telephone call to patient regarding health team advantage referral. Unable to reach patient. HIPAA compliant voice message left with call back phone numbers.     PLAN: RNCM will attempt 3rd telephone call to patient within 1 week.   Quinn Plowman RN,BSN,CCM [facility] Telephonic  [phone]

## 2016-07-05 ENCOUNTER — Ambulatory Visit: Payer: Self-pay

## 2016-07-06 NOTE — Addendum Note (Signed)
Addendum  created 07/06/16 1415 by Rica Koyanagi, MD   Sign clinical note

## 2016-07-09 ENCOUNTER — Other Ambulatory Visit: Payer: Self-pay

## 2016-07-09 NOTE — Patient Outreach (Signed)
[name]) Care Management  07/09/2016  Haddy Mullinax 1956-01-31 [id number]  TELEPHONE SCREENING Referral date: 06/27/16 Referral source: Health team advantage concierge Referral reason: Previous breast cancer surgeries, several needs, last discharge from the hospital 05/29/16 Insurance: Health team advantage Attempt #3  Telephone call to patient regarding health team advantage referral. Unable to reach patient. HIPAA compliant voice message left with call back phone numbers. Call to patients primary MD office. Confirmed contact number of patient with appointment desk receptionist. Confirmed patient not referred to [facility] by primary MD.     PLAN: RNCM will send patient outreach letter to attempt contact.  Quinn Plowman RN,BSN,CCM [facility] Telephonic  [phone]

## 2016-07-10 ENCOUNTER — Ambulatory Visit
Admission: RE | Admit: 2016-07-10 | Discharge: 2016-07-10 | Disposition: A | Discharge status: Home / Self Care | Payer: PPO | Source: Ambulatory Visit | Attending: Radiation Oncology | Admitting: Radiation Oncology

## 2016-07-10 DIAGNOSIS — C50512 Malignant neoplasm of lower-outer quadrant of left female breast: Secondary | ICD-10-CM | POA: Diagnosis not present

## 2016-07-10 DIAGNOSIS — Z170 Estrogen receptor positive status [ER+]: Principal | ICD-10-CM

## 2016-07-10 DIAGNOSIS — Z510 Encounter for antineoplastic radiation therapy: Secondary | ICD-10-CM | POA: Diagnosis not present

## 2016-07-17 ENCOUNTER — Ambulatory Visit: Payer: PPO | Admitting: Radiation Oncology

## 2016-07-17 NOTE — Progress Notes (Signed)
  Radiation Oncology         [phone]) [phone] ________________________________  Name: Ashley Cross MRN: [id number]  Date: 07/10/2016  DOB: Sep 18, 1955  Diagnosis DIAGNOSIS:     ICD-10-CM   1. Malignant neoplasm of lower-outer quadrant of left breast of female, estrogen receptor positive ([place]) C50.512    Z17.0      SIMULATION AND TREATMENT PLANNING NOTE  The patient presented for simulation prior to beginning her course of radiation treatment for her diagnosis of left-sided breast cancer. The patient was placed in a supine position on a breast board. A customized vac-lock bag was also constructed and this complex treatment device will be used on a daily basis during her treatment. In this fashion, a CT scan was obtained through the chest area and an isocenter was placed near the chest wall at the upper aspect of the right chest. A breath-hold technique has also been evaluated to determine if this significantly improves the spatial relationship between the target region and the heart. Based on this analysis, a breath-hold technique has been ordered for the patient's treatment.  The patient will be planned to receive a course of radiation initially to a dose of 50.4 gray. This will consist of a 4 field technique targeting the left breast as well as the supraclavicular region. Therefore 2 customized medial and lateral tangent fields have been created targeting the chest wall, and also 2 additional customized fields have been designed to treat the supraclavicular region both with a left supraclavicular field and a left posterior axillary boost field. A forward planning/reduced field technique will also be evaluated to determine if this significantly improves the dose homogeneity of the overall plan. Therefore, additional customized blocks/fields may be necessary.  This initial treatment will be accomplished at 1.8 gray per fraction.   The initial plan will consist of a 3-D conformal technique. The  target volume/scar, heart and lungs have been contoured and dose volume histograms of each of these structures will be evaluated as part of the 3-D conformal treatment planning process.   It is anticipated that the patient will then receive a 10 gray boost to the surgical scar. This will be accomplished at 2 gray per fraction. The final anticipated total dose therefore will correspond to 60.4 gray.   Special treatment procedure was performed today due to the extra time and effort required by myself to plan and prepare this patient for deep inspiration breath hold technique.  I have determined cardiac sparing to be of benefit to this patient to prevent long term cardiac damage due to radiation of the heart.  Bellows were placed on the patient's abdomen. To facilitate cardiac sparing, the patient was coached by the radiation therapists on breath hold techniques and breathing practice was performed. Practice waveforms were obtained. The patient was then scanned while maintaining breath hold in the treatment position.  This image was then transferred over to the imaging specialist. The imaging specialist then created a fusion of the free breathing and breath hold scans using the chest wall as the stable structure. I personally reviewed the fusion in axial, coronal and sagittal image planes.  Excellent cardiac sparing was obtained.  I felt the patient is an appropriate candidate for breath hold and the patient will be treated as such.  The image fusion was then reviewed with the patient to reinforce the necessity of reproducible breath hold.      _______________________________   Jodelle Gross, MD, PhD

## 2016-07-17 NOTE — Progress Notes (Signed)
  Radiation Oncology         [phone]) [phone] ________________________________  Name: Ashley Cross MRN: [id number]  Date: 07/10/2016  DOB: 23-Sep-1955  Optical Surface Tracking Plan:  Since intensity modulated radiotherapy (IMRT) and 3D conformal radiation treatment methods are predicated on accurate and precise positioning for treatment, intrafraction motion monitoring is medically necessary to ensure accurate and safe treatment delivery.  The ability to quantify intrafraction motion without excessive ionizing radiation dose can only be performed with optical surface tracking. Accordingly, surface imaging offers the opportunity to obtain 3D measurements of patient position throughout IMRT and 3D treatments without excessive radiation exposure.  I am ordering optical surface tracking for this patient's upcoming course of radiotherapy. ________________________________  Kyung Rudd, MD 07/17/2016 9:45 AM    Reference:   Ursula Alert, J, et al. Surface imaging-based analysis of intrafraction motion for breast radiotherapy patients.Journal of [place], n. 6, nov. 2014. ISSN [id number].   Available at: <http://www.jacmp.org/index.php/jacmp/article/view/4957>.

## 2016-07-18 ENCOUNTER — Ambulatory Visit: Payer: PPO

## 2016-07-19 ENCOUNTER — Ambulatory Visit: Payer: PPO

## 2016-07-19 DIAGNOSIS — Z510 Encounter for antineoplastic radiation therapy: Secondary | ICD-10-CM | POA: Diagnosis not present

## 2016-07-19 DIAGNOSIS — C50512 Malignant neoplasm of lower-outer quadrant of left female breast: Secondary | ICD-10-CM | POA: Diagnosis not present

## 2016-07-20 ENCOUNTER — Ambulatory Visit: Payer: PPO

## 2016-07-23 ENCOUNTER — Ambulatory Visit
Admission: RE | Admit: 2016-07-23 | Discharge: 2016-07-23 | Disposition: A | Discharge status: Home / Self Care | Payer: PPO | Source: Ambulatory Visit | Attending: Radiation Oncology | Admitting: Radiation Oncology

## 2016-07-23 DIAGNOSIS — C50512 Malignant neoplasm of lower-outer quadrant of left female breast: Secondary | ICD-10-CM | POA: Diagnosis not present

## 2016-07-23 DIAGNOSIS — Z510 Encounter for antineoplastic radiation therapy: Secondary | ICD-10-CM | POA: Diagnosis not present

## 2016-07-24 ENCOUNTER — Ambulatory Visit
Admission: RE | Admit: 2016-07-24 | Discharge: 2016-07-24 | Disposition: A | Discharge status: Home / Self Care | Payer: PPO | Source: Ambulatory Visit | Attending: Radiation Oncology | Admitting: Radiation Oncology

## 2016-07-24 DIAGNOSIS — C50512 Malignant neoplasm of lower-outer quadrant of left female breast: Secondary | ICD-10-CM | POA: Diagnosis not present

## 2016-07-24 DIAGNOSIS — Z510 Encounter for antineoplastic radiation therapy: Secondary | ICD-10-CM | POA: Insufficient documentation

## 2016-07-24 DIAGNOSIS — Z170 Estrogen receptor positive status [ER+]: Secondary | ICD-10-CM | POA: Insufficient documentation

## 2016-07-25 ENCOUNTER — Ambulatory Visit
Admission: RE | Admit: 2016-07-25 | Discharge: 2016-07-25 | Disposition: A | Discharge status: Home / Self Care | Payer: PPO | Source: Ambulatory Visit | Attending: Radiation Oncology | Admitting: Radiation Oncology

## 2016-07-25 DIAGNOSIS — Z510 Encounter for antineoplastic radiation therapy: Secondary | ICD-10-CM | POA: Diagnosis not present

## 2016-07-25 DIAGNOSIS — C50512 Malignant neoplasm of lower-outer quadrant of left female breast: Secondary | ICD-10-CM | POA: Diagnosis not present

## 2016-07-26 ENCOUNTER — Ambulatory Visit
Admission: RE | Admit: 2016-07-26 | Discharge: 2016-07-26 | Disposition: A | Discharge status: Home / Self Care | Payer: PPO | Source: Ambulatory Visit | Attending: Radiation Oncology | Admitting: Radiation Oncology

## 2016-07-26 DIAGNOSIS — C50512 Malignant neoplasm of lower-outer quadrant of left female breast: Secondary | ICD-10-CM

## 2016-07-26 DIAGNOSIS — Z510 Encounter for antineoplastic radiation therapy: Secondary | ICD-10-CM | POA: Diagnosis not present

## 2016-07-26 DIAGNOSIS — Z170 Estrogen receptor positive status [ER+]: Principal | ICD-10-CM

## 2016-07-26 MED ORDER — RADIAPLEXRX EX GEL
Freq: Once | CUTANEOUS | Status: AC
  Administered 2016-07-26: 13:00:00 via TOPICAL

## 2016-07-26 MED ORDER — ALRA NON-METALLIC DEODORANT (RAD-ONC)
1.0000 "application " | Freq: Once | TOPICAL | Status: AC
  Administered 2016-07-26: 1 via TOPICAL

## 2016-07-26 NOTE — Progress Notes (Signed)

## 2016-07-27 ENCOUNTER — Ambulatory Visit
Admission: RE | Admit: 2016-07-27 | Discharge: 2016-07-27 | Disposition: A | Discharge status: Home / Self Care | Payer: PPO | Source: Ambulatory Visit | Attending: Radiation Oncology | Admitting: Radiation Oncology

## 2016-07-27 ENCOUNTER — Other Ambulatory Visit: Payer: Self-pay

## 2016-07-27 DIAGNOSIS — C50512 Malignant neoplasm of lower-outer quadrant of left female breast: Secondary | ICD-10-CM | POA: Diagnosis not present

## 2016-07-27 DIAGNOSIS — Z510 Encounter for antineoplastic radiation therapy: Secondary | ICD-10-CM | POA: Diagnosis not present

## 2016-07-27 NOTE — Patient Outreach (Signed)
[facility]) Care Management  07/27/2016  Elham Fini November 11, 1955 [id number]  Case Closure: No response from patient after 3 telephone attempts and outreach letter.  PLAN; RNCM will refer patient to care management assistant to close due to being unable to contact patient.  RNCM will notify patients primary MD of closure.   Quinn Plowman RN,BSN,CCM [facility] Telephonic  [phone]

## 2016-07-30 ENCOUNTER — Ambulatory Visit
Admission: RE | Admit: 2016-07-30 | Discharge: 2016-07-30 | Disposition: A | Discharge status: Home / Self Care | Payer: PPO | Source: Ambulatory Visit | Attending: Radiation Oncology | Admitting: Radiation Oncology

## 2016-07-30 DIAGNOSIS — Z510 Encounter for antineoplastic radiation therapy: Secondary | ICD-10-CM | POA: Diagnosis not present

## 2016-07-30 DIAGNOSIS — C50512 Malignant neoplasm of lower-outer quadrant of left female breast: Secondary | ICD-10-CM | POA: Diagnosis not present

## 2016-07-31 ENCOUNTER — Ambulatory Visit
Admission: RE | Admit: 2016-07-31 | Discharge: 2016-07-31 | Disposition: A | Discharge status: Home / Self Care | Payer: PPO | Source: Ambulatory Visit | Attending: Radiation Oncology | Admitting: Radiation Oncology

## 2016-07-31 DIAGNOSIS — Z510 Encounter for antineoplastic radiation therapy: Secondary | ICD-10-CM | POA: Diagnosis not present

## 2016-07-31 DIAGNOSIS — C50512 Malignant neoplasm of lower-outer quadrant of left female breast: Secondary | ICD-10-CM | POA: Diagnosis not present

## 2016-08-01 ENCOUNTER — Ambulatory Visit
Admission: RE | Admit: 2016-08-01 | Discharge: 2016-08-01 | Disposition: A | Discharge status: Home / Self Care | Payer: PPO | Source: Ambulatory Visit | Attending: Radiation Oncology | Admitting: Radiation Oncology

## 2016-08-01 DIAGNOSIS — Z510 Encounter for antineoplastic radiation therapy: Secondary | ICD-10-CM | POA: Diagnosis not present

## 2016-08-01 DIAGNOSIS — C50512 Malignant neoplasm of lower-outer quadrant of left female breast: Secondary | ICD-10-CM | POA: Diagnosis not present

## 2016-08-02 ENCOUNTER — Ambulatory Visit
Admission: RE | Admit: 2016-08-02 | Discharge: 2016-08-02 | Disposition: A | Discharge status: Home / Self Care | Payer: PPO | Source: Ambulatory Visit | Attending: Radiation Oncology | Admitting: Radiation Oncology

## 2016-08-02 DIAGNOSIS — C50512 Malignant neoplasm of lower-outer quadrant of left female breast: Secondary | ICD-10-CM | POA: Diagnosis not present

## 2016-08-02 DIAGNOSIS — Z510 Encounter for antineoplastic radiation therapy: Secondary | ICD-10-CM | POA: Diagnosis not present

## 2016-08-03 ENCOUNTER — Ambulatory Visit
Admission: RE | Admit: 2016-08-03 | Discharge: 2016-08-03 | Disposition: A | Discharge status: Home / Self Care | Payer: PPO | Source: Ambulatory Visit | Attending: Radiation Oncology | Admitting: Radiation Oncology

## 2016-08-03 DIAGNOSIS — Z510 Encounter for antineoplastic radiation therapy: Secondary | ICD-10-CM | POA: Diagnosis not present

## 2016-08-03 DIAGNOSIS — C50512 Malignant neoplasm of lower-outer quadrant of left female breast: Secondary | ICD-10-CM | POA: Diagnosis not present

## 2016-08-05 DIAGNOSIS — Z170 Estrogen receptor positive status [ER+]: Secondary | ICD-10-CM | POA: Diagnosis not present

## 2016-08-05 DIAGNOSIS — C50512 Malignant neoplasm of lower-outer quadrant of left female breast: Secondary | ICD-10-CM | POA: Diagnosis not present

## 2016-08-05 DIAGNOSIS — Z510 Encounter for antineoplastic radiation therapy: Secondary | ICD-10-CM | POA: Diagnosis not present

## 2016-08-06 ENCOUNTER — Ambulatory Visit
Admission: RE | Admit: 2016-08-06 | Discharge: 2016-08-06 | Disposition: A | Discharge status: Home / Self Care | Payer: PPO | Source: Ambulatory Visit | Attending: Radiation Oncology | Admitting: Radiation Oncology

## 2016-08-06 DIAGNOSIS — C50512 Malignant neoplasm of lower-outer quadrant of left female breast: Secondary | ICD-10-CM | POA: Diagnosis not present

## 2016-08-06 DIAGNOSIS — Z510 Encounter for antineoplastic radiation therapy: Secondary | ICD-10-CM | POA: Diagnosis not present

## 2016-08-07 ENCOUNTER — Ambulatory Visit
Admission: RE | Admit: 2016-08-07 | Discharge: 2016-08-07 | Disposition: A | Discharge status: Home / Self Care | Payer: PPO | Source: Ambulatory Visit | Attending: Radiation Oncology | Admitting: Radiation Oncology

## 2016-08-07 DIAGNOSIS — Z510 Encounter for antineoplastic radiation therapy: Secondary | ICD-10-CM | POA: Diagnosis not present

## 2016-08-07 DIAGNOSIS — C50512 Malignant neoplasm of lower-outer quadrant of left female breast: Secondary | ICD-10-CM | POA: Diagnosis not present

## 2016-08-09 ENCOUNTER — Ambulatory Visit
Admission: RE | Admit: 2016-08-09 | Discharge: 2016-08-09 | Disposition: A | Discharge status: Home / Self Care | Payer: PPO | Source: Ambulatory Visit | Attending: Radiation Oncology | Admitting: Radiation Oncology

## 2016-08-09 DIAGNOSIS — C50512 Malignant neoplasm of lower-outer quadrant of left female breast: Secondary | ICD-10-CM | POA: Diagnosis not present

## 2016-08-09 DIAGNOSIS — Z170 Estrogen receptor positive status [ER+]: Secondary | ICD-10-CM | POA: Diagnosis not present

## 2016-08-09 DIAGNOSIS — Z510 Encounter for antineoplastic radiation therapy: Secondary | ICD-10-CM | POA: Diagnosis not present

## 2016-08-10 ENCOUNTER — Ambulatory Visit
Admission: RE | Admit: 2016-08-10 | Discharge: 2016-08-10 | Disposition: A | Discharge status: Home / Self Care | Payer: PPO | Source: Ambulatory Visit | Attending: Radiation Oncology | Admitting: Radiation Oncology

## 2016-08-10 DIAGNOSIS — C50512 Malignant neoplasm of lower-outer quadrant of left female breast: Secondary | ICD-10-CM | POA: Diagnosis not present

## 2016-08-10 DIAGNOSIS — Z510 Encounter for antineoplastic radiation therapy: Secondary | ICD-10-CM | POA: Diagnosis not present

## 2016-08-13 ENCOUNTER — Ambulatory Visit
Admission: RE | Admit: 2016-08-13 | Discharge: 2016-08-13 | Disposition: A | Discharge status: Home / Self Care | Payer: PPO | Source: Ambulatory Visit | Attending: Radiation Oncology | Admitting: Radiation Oncology

## 2016-08-13 ENCOUNTER — Ambulatory Visit: Payer: PPO | Attending: General Surgery | Admitting: Physical Therapy

## 2016-08-13 DIAGNOSIS — Z9189 Other specified personal risk factors, not elsewhere classified: Secondary | ICD-10-CM | POA: Diagnosis not present

## 2016-08-13 DIAGNOSIS — Z510 Encounter for antineoplastic radiation therapy: Secondary | ICD-10-CM | POA: Diagnosis not present

## 2016-08-13 DIAGNOSIS — M25612 Stiffness of left shoulder, not elsewhere classified: Secondary | ICD-10-CM | POA: Diagnosis not present

## 2016-08-13 DIAGNOSIS — C50512 Malignant neoplasm of lower-outer quadrant of left female breast: Secondary | ICD-10-CM | POA: Diagnosis not present
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
## 2016-08-14 ENCOUNTER — Ambulatory Visit
Admission: RE | Admit: 2016-08-14 | Discharge: 2016-08-14 | Disposition: A | Discharge status: Home / Self Care | Payer: PPO | Source: Ambulatory Visit | Attending: Radiation Oncology | Admitting: Radiation Oncology

## 2016-08-14 DIAGNOSIS — Z510 Encounter for antineoplastic radiation therapy: Secondary | ICD-10-CM | POA: Diagnosis not present

## 2016-08-14 DIAGNOSIS — C50512 Malignant neoplasm of lower-outer quadrant of left female breast: Secondary | ICD-10-CM | POA: Diagnosis not present

## 2016-08-15 ENCOUNTER — Ambulatory Visit
Admission: RE | Admit: 2016-08-15 | Discharge: 2016-08-15 | Disposition: A | Discharge status: Home / Self Care | Payer: PPO | Source: Ambulatory Visit | Attending: Radiation Oncology | Admitting: Radiation Oncology

## 2016-08-15 DIAGNOSIS — C50512 Malignant neoplasm of lower-outer quadrant of left female breast: Secondary | ICD-10-CM | POA: Diagnosis not present

## 2016-08-15 DIAGNOSIS — Z510 Encounter for antineoplastic radiation therapy: Secondary | ICD-10-CM | POA: Diagnosis not present

## 2016-08-16 ENCOUNTER — Ambulatory Visit
Admission: RE | Admit: 2016-08-16 | Discharge: 2016-08-16 | Disposition: A | Discharge status: Home / Self Care | Payer: PPO | Source: Ambulatory Visit | Attending: Radiation Oncology | Admitting: Radiation Oncology

## 2016-08-16 DIAGNOSIS — C50512 Malignant neoplasm of lower-outer quadrant of left female breast: Secondary | ICD-10-CM | POA: Diagnosis not present

## 2016-08-16 DIAGNOSIS — Z510 Encounter for antineoplastic radiation therapy: Secondary | ICD-10-CM | POA: Diagnosis not present

## 2016-08-17 ENCOUNTER — Ambulatory Visit
Admission: RE | Admit: 2016-08-17 | Discharge: 2016-08-17 | Disposition: A | Discharge status: Home / Self Care | Payer: PPO | Source: Ambulatory Visit | Attending: Radiation Oncology | Admitting: Radiation Oncology

## 2016-08-17 DIAGNOSIS — Z510 Encounter for antineoplastic radiation therapy: Secondary | ICD-10-CM | POA: Diagnosis not present

## 2016-08-17 DIAGNOSIS — C50512 Malignant neoplasm of lower-outer quadrant of left female breast: Secondary | ICD-10-CM | POA: Diagnosis not present

## 2016-08-20 ENCOUNTER — Ambulatory Visit
Admission: RE | Admit: 2016-08-20 | Discharge: 2016-08-20 | Disposition: A | Discharge status: Home / Self Care | Payer: PPO | Source: Ambulatory Visit | Attending: Radiation Oncology | Admitting: Radiation Oncology

## 2016-08-20 DIAGNOSIS — C50512 Malignant neoplasm of lower-outer quadrant of left female breast: Secondary | ICD-10-CM | POA: Diagnosis not present

## 2016-08-20 DIAGNOSIS — Z510 Encounter for antineoplastic radiation therapy: Secondary | ICD-10-CM | POA: Diagnosis not present

## 2016-08-21 ENCOUNTER — Ambulatory Visit
Admission: RE | Admit: 2016-08-21 | Discharge: 2016-08-21 | Disposition: A | Discharge status: Home / Self Care | Payer: PPO | Source: Ambulatory Visit | Attending: Radiation Oncology | Admitting: Radiation Oncology

## 2016-08-21 DIAGNOSIS — Z510 Encounter for antineoplastic radiation therapy: Secondary | ICD-10-CM | POA: Diagnosis not present

## 2016-08-21 DIAGNOSIS — Z170 Estrogen receptor positive status [ER+]: Principal | ICD-10-CM

## 2016-08-21 DIAGNOSIS — C50512 Malignant neoplasm of lower-outer quadrant of left female breast: Secondary | ICD-10-CM | POA: Diagnosis not present

## 2016-08-21 MED ORDER — RADIAPLEXRX EX GEL
Freq: Once | CUTANEOUS | Status: AC
Start: 2016-08-21 — End: 2016-08-21
  Administered 2016-08-21: 14:00:00 via TOPICAL

## 2016-08-22 ENCOUNTER — Ambulatory Visit
Admission: RE | Admit: 2016-08-22 | Discharge: 2016-08-22 | Disposition: A | Discharge status: Home / Self Care | Payer: PPO | Source: Ambulatory Visit | Attending: Radiation Oncology | Admitting: Radiation Oncology

## 2016-08-22 DIAGNOSIS — C50512 Malignant neoplasm of lower-outer quadrant of left female breast: Secondary | ICD-10-CM | POA: Diagnosis not present

## 2016-08-22 DIAGNOSIS — Z510 Encounter for antineoplastic radiation therapy: Secondary | ICD-10-CM | POA: Diagnosis not present

## 2016-08-23 ENCOUNTER — Ambulatory Visit
Admission: RE | Admit: 2016-08-23 | Discharge: 2016-08-23 | Disposition: A | Discharge status: Home / Self Care | Payer: PPO | Source: Ambulatory Visit | Attending: Radiation Oncology | Admitting: Radiation Oncology

## 2016-08-23 DIAGNOSIS — C50512 Malignant neoplasm of lower-outer quadrant of left female breast: Secondary | ICD-10-CM | POA: Diagnosis not present

## 2016-08-23 DIAGNOSIS — Z510 Encounter for antineoplastic radiation therapy: Secondary | ICD-10-CM | POA: Diagnosis not present

## 2016-08-24 ENCOUNTER — Ambulatory Visit
Admission: RE | Admit: 2016-08-24 | Discharge: 2016-08-24 | Disposition: A | Discharge status: Home / Self Care | Payer: PPO | Source: Ambulatory Visit | Attending: Radiation Oncology | Admitting: Radiation Oncology

## 2016-08-24 DIAGNOSIS — C50512 Malignant neoplasm of lower-outer quadrant of left female breast: Secondary | ICD-10-CM | POA: Diagnosis not present

## 2016-08-24 DIAGNOSIS — Z510 Encounter for antineoplastic radiation therapy: Secondary | ICD-10-CM | POA: Diagnosis not present

## 2016-08-27 ENCOUNTER — Ambulatory Visit
Admission: RE | Admit: 2016-08-27 | Discharge: 2016-08-27 | Disposition: A | Discharge status: Home / Self Care | Payer: PPO | Source: Ambulatory Visit | Attending: Radiation Oncology | Admitting: Radiation Oncology

## 2016-08-27 DIAGNOSIS — Z510 Encounter for antineoplastic radiation therapy: Secondary | ICD-10-CM | POA: Diagnosis not present

## 2016-08-27 DIAGNOSIS — C50512 Malignant neoplasm of lower-outer quadrant of left female breast: Secondary | ICD-10-CM | POA: Diagnosis not present

## 2016-08-28 ENCOUNTER — Ambulatory Visit
Admission: RE | Admit: 2016-08-28 | Discharge: 2016-08-28 | Disposition: A | Discharge status: Home / Self Care | Payer: PPO | Source: Ambulatory Visit | Attending: Radiation Oncology | Admitting: Radiation Oncology

## 2016-08-28 DIAGNOSIS — Z510 Encounter for antineoplastic radiation therapy: Secondary | ICD-10-CM | POA: Diagnosis not present

## 2016-08-28 DIAGNOSIS — C50512 Malignant neoplasm of lower-outer quadrant of left female breast: Secondary | ICD-10-CM | POA: Diagnosis not present

## 2016-08-29 ENCOUNTER — Ambulatory Visit
Admission: RE | Admit: 2016-08-29 | Discharge: 2016-08-29 | Disposition: A | Discharge status: Home / Self Care | Payer: PPO | Source: Ambulatory Visit | Attending: Radiation Oncology | Admitting: Radiation Oncology

## 2016-08-29 DIAGNOSIS — Z510 Encounter for antineoplastic radiation therapy: Secondary | ICD-10-CM | POA: Diagnosis not present

## 2016-08-29 DIAGNOSIS — C50512 Malignant neoplasm of lower-outer quadrant of left female breast: Secondary | ICD-10-CM | POA: Diagnosis not present

## 2016-08-30 ENCOUNTER — Ambulatory Visit
Admission: RE | Admit: 2016-08-30 | Discharge: 2016-08-30 | Disposition: A | Discharge status: Home / Self Care | Payer: PPO | Source: Ambulatory Visit | Attending: Radiation Oncology | Admitting: Radiation Oncology

## 2016-08-30 DIAGNOSIS — C50512 Malignant neoplasm of lower-outer quadrant of left female breast: Secondary | ICD-10-CM | POA: Diagnosis not present

## 2016-08-30 DIAGNOSIS — Z510 Encounter for antineoplastic radiation therapy: Secondary | ICD-10-CM | POA: Diagnosis not present

## 2016-08-31 ENCOUNTER — Ambulatory Visit
Admission: RE | Admit: 2016-08-31 | Discharge: 2016-08-31 | Disposition: A | Discharge status: Home / Self Care | Payer: PPO | Source: Ambulatory Visit | Attending: Radiation Oncology | Admitting: Radiation Oncology

## 2016-08-31 DIAGNOSIS — Z510 Encounter for antineoplastic radiation therapy: Secondary | ICD-10-CM | POA: Diagnosis not present

## 2016-08-31 DIAGNOSIS — C50512 Malignant neoplasm of lower-outer quadrant of left female breast: Secondary | ICD-10-CM | POA: Diagnosis not present

## 2016-09-03 ENCOUNTER — Ambulatory Visit
Admission: RE | Admit: 2016-09-03 | Discharge: 2016-09-03 | Disposition: A | Discharge status: Home / Self Care | Payer: PPO | Source: Ambulatory Visit | Attending: Radiation Oncology | Admitting: Radiation Oncology

## 2016-09-03 DIAGNOSIS — C50512 Malignant neoplasm of lower-outer quadrant of left female breast: Secondary | ICD-10-CM | POA: Diagnosis not present

## 2016-09-03 DIAGNOSIS — Z510 Encounter for antineoplastic radiation therapy: Secondary | ICD-10-CM | POA: Diagnosis not present

## 2016-09-04 ENCOUNTER — Ambulatory Visit
Admission: RE | Admit: 2016-09-04 | Discharge: 2016-09-04 | Disposition: A | Discharge status: Home / Self Care | Payer: PPO | Source: Ambulatory Visit | Attending: Radiation Oncology | Admitting: Radiation Oncology

## 2016-09-04 DIAGNOSIS — C50512 Malignant neoplasm of lower-outer quadrant of left female breast: Secondary | ICD-10-CM | POA: Diagnosis not present

## 2016-09-04 DIAGNOSIS — Z510 Encounter for antineoplastic radiation therapy: Secondary | ICD-10-CM | POA: Diagnosis not present

## 2016-09-05 ENCOUNTER — Ambulatory Visit
Admission: RE | Admit: 2016-09-05 | Discharge: 2016-09-05 | Disposition: A | Discharge status: Home / Self Care | Payer: PPO | Source: Ambulatory Visit | Attending: Radiation Oncology | Admitting: Radiation Oncology

## 2016-09-05 DIAGNOSIS — C50512 Malignant neoplasm of lower-outer quadrant of left female breast: Secondary | ICD-10-CM | POA: Diagnosis not present

## 2016-09-05 DIAGNOSIS — Z510 Encounter for antineoplastic radiation therapy: Secondary | ICD-10-CM | POA: Diagnosis not present

## 2016-09-06 ENCOUNTER — Ambulatory Visit
Admission: RE | Admit: 2016-09-06 | Discharge: 2016-09-06 | Disposition: A | Discharge status: Home / Self Care | Payer: PPO | Source: Ambulatory Visit | Attending: Radiation Oncology | Admitting: Radiation Oncology

## 2016-09-06 DIAGNOSIS — C50512 Malignant neoplasm of lower-outer quadrant of left female breast: Secondary | ICD-10-CM | POA: Diagnosis not present

## 2016-09-06 DIAGNOSIS — Z510 Encounter for antineoplastic radiation therapy: Secondary | ICD-10-CM | POA: Diagnosis not present

## 2016-09-07 ENCOUNTER — Ambulatory Visit
Admission: RE | Admit: 2016-09-07 | Discharge: 2016-09-07 | Disposition: A | Discharge status: Home / Self Care | Payer: PPO | Source: Ambulatory Visit | Attending: Radiation Oncology | Admitting: Radiation Oncology

## 2016-09-07 ENCOUNTER — Ambulatory Visit (HOSPITAL_BASED_OUTPATIENT_CLINIC_OR_DEPARTMENT_OTHER): Payer: PPO | Admitting: Hematology and Oncology

## 2016-09-07 ENCOUNTER — Encounter: Payer: Self-pay | Admitting: Hematology and Oncology

## 2016-09-07 DIAGNOSIS — Z170 Estrogen receptor positive status [ER+]: Secondary | ICD-10-CM

## 2016-09-07 DIAGNOSIS — C773 Secondary and unspecified malignant neoplasm of axilla and upper limb lymph nodes: Secondary | ICD-10-CM

## 2016-09-07 DIAGNOSIS — Z510 Encounter for antineoplastic radiation therapy: Secondary | ICD-10-CM | POA: Diagnosis not present

## 2016-09-07 DIAGNOSIS — C50512 Malignant neoplasm of lower-outer quadrant of left female breast: Secondary | ICD-10-CM

## 2016-09-07 MED ORDER — VITAMIN D (ERGOCALCIFEROL) 1.25 MG (50000 UNIT) PO CAPS: 50000.0000 [IU] | ORAL_CAPSULE | ORAL | 3 refills | Status: DC

## 2016-09-07 MED ORDER — ANASTROZOLE 1 MG PO TABS: 1.0000 mg | ORAL_TABLET | Freq: Every day | ORAL | 3 refills | Status: DC

## 2016-09-07 NOTE — Progress Notes (Signed)
Patient Care Team: McLean-Scocuzza, Nino Glow, MD as PCP - General (Internal Medicine) Perrin Maltese, MD (Internal Medicine)  DIAGNOSIS:  Encounter Diagnosis  Name Primary?  . Malignant neoplasm of lower-outer quadrant of left breast of female, estrogen receptor positive ([place])     SUMMARY OF ONCOLOGIC HISTORY:   Malignant neoplasm of lower-outer quadrant of left breast of female, estrogen receptor positive ([place])   04/12/2016 Initial Diagnosis    Left breast biopsy/ lumpectomy: IDC grade 1, 0.8 cm with DCIS grade 1, margins negative, ER 100%, PR 100%, HER-2 negative, Ki-67 15%, T1b NX stage I a      05/21/2016 PET scan    Mild focal hypermetabolic in the left breast, no findings of metastatic disease. mild hypermetabolism is along the right costosternal junction favoring degenerative changes posttraumatic changes involving the right inferior scapular right sacrum bilateral pelvis and right lateral third and fourth ribs      05/29/2016 Surgery    1/4 sentinel lymph node positive with extracapsular extension       06/01/2016 Miscellaneous    Mammaprint low-risk: Average 10 year risk of recurrence 10%; Mammaprint index +0.389      07/24/2016 - 09/07/2016 Radiation Therapy    Adjuvant radiation therapy       CHIEF COMPLIANT: Follow-up after adjuvant radiation  INTERVAL HISTORY: Ashley Cross is a 61 year old with above-mentioned history of left breast cancer who underwent lumpectomy followed by adjuvant radiation therapy. She had one positive sentinel lymph node. The Mammaprint came back as low risk. She did not need systemic chemotherapy.  REVIEW OF SYSTEMS:   Constitutional: Denies fevers, chills or abnormal weight loss Eyes: Denies blurriness of vision Ears, nose, mouth, throat, and face: Denies mucositis or sore throat Respiratory: Denies cough, dyspnea or wheezes Cardiovascular: Denies palpitation, chest discomfort Gastrointestinal:  Denies nausea, heartburn or change  in bowel habits Skin: Denies abnormal skin rashes Lymphatics: Denies new lymphadenopathy or easy bruising Neurological:Denies numbness, tingling or new weaknesses Behavioral/Psych: Mood is stable, no new changes  Extremities: No lower extremity edema Breast: Radiation dermatitis All other systems were reviewed with the patient and are negative.  I have reviewed the past medical history, past surgical history, social history and family history with the patient and they are unchanged from previous note.  ALLERGIES:  is allergic to sulfa antibiotics and sulfonamide derivatives.  MEDICATIONS:  Current Outpatient Prescriptions  Medication Sig Dispense Refill  . amitriptyline (ELAVIL) 50 MG tablet Take 50 mg by mouth at bedtime.    Marland Kitchen anastrozole (ARIMIDEX) 1 MG tablet Take 1 tablet (1 mg total) by mouth daily. 90 tablet 3  . diazepam (VALIUM) 2 MG tablet Take 2 mg by mouth at bedtime as needed for muscle spasms or anxiety.    . gabapentin (NEURONTIN) 600 MG tablet Take 1,200 mg by mouth 3 (three) times daily.    Marland Kitchen ibuprofen (ADVIL,MOTRIN) 200 MG tablet Take 800 mg by mouth every 8 (eight) hours as needed (for pain).     Marland Kitchen oxymorphone (OPANA) 10 MG tablet Take 10 mg by mouth every 4 (four) hours as needed for pain.    Derrill Memo ON 09/10/2016] Vitamin D, Ergocalciferol, (DRISDOL) 50000 units CAPS capsule Take 1 capsule (50,000 Units total) by mouth every Monday. 12 capsule 3   No current facility-administered medications for this visit.     PHYSICAL EXAMINATION: ECOG PERFORMANCE STATUS: 1 - Symptomatic but completely ambulatory  Vitals:   09/07/16 1059  BP: (!) 151/78  Pulse: 91  Resp:  18  Temp: 98.3 F (36.8 C)   Filed Weights   09/07/16 1059  Weight: 131 lb 14.4 oz (59.8 kg)    GENERAL:alert, no distress and comfortable SKIN: skin color, texture, turgor are normal, no rashes or significant lesions EYES: normal, Conjunctiva are pink and non-injected, sclera clear OROPHARYNX:no  exudate, no erythema and lips, buccal mucosa, and tongue normal  NECK: supple, thyroid normal size, non-tender, without nodularity LYMPH:  no palpable lymphadenopathy in the cervical, axillary or inguinal LUNGS: clear to auscultation and percussion with normal breathing effort HEART: regular rate & rhythm and no murmurs and no lower extremity edema ABDOMEN:abdomen soft, non-tender and normal bowel sounds MUSCULOSKELETAL:no cyanosis of digits and no clubbing  NEURO: alert & oriented x 3 with fluent speech, no focal motor/sensory deficits EXTREMITIES: No lower extremity edema  LABORATORY DATA:  I have reviewed the data as listed   Chemistry      Component Value Date/Time   NA 138 05/16/2016 1025   K 4.4 05/16/2016 1025   CL 101 05/16/2016 1025   CO2 27 05/16/2016 1025   BUN 8 05/16/2016 1025   CREATININE 0.52 05/16/2016 1025      Component Value Date/Time   CALCIUM 9.7 05/16/2016 1025   ALKPHOS 66 01/29/2016 2126   AST 69 (H) 01/29/2016 2126   ALT 34 01/29/2016 2126   BILITOT 0.4 01/29/2016 2126       Lab Results  Component Value Date   WBC 6.0 05/16/2016   HGB 14.7 05/16/2016   HCT 45.0 05/16/2016   MCV 94.3 05/16/2016   PLT 227 05/16/2016   NEUTROABS 6.3 01/31/2016    ASSESSMENT & PLAN:  Malignant neoplasm of lower-outer quadrant of left breast of female, estrogen receptor positive ([place]) 04/12/2016 Left breast biopsy/ lumpectomy: IDC grade 10.8 cm with DCIS grade 1, margins negative, ER 100%, PR 100%, HER-2 negative, Ki-67 15%  PET/CT scan: Negative for metastatic disease Sentinel lymph node study: 1/4 sentinel nodes positive with extracapsular extension  Mammaprint low-risk: 10 year risk of recurrence 10% Adjuvant radiation therapy 07/24/2016- 09/07/2016  Treatment plan: Adjuvant antiestrogen therapy with anastrozole 1 mg daily 7 years  Anastrozole counseling:We discussed the risks and benefits of anti-estrogen therapy with aromatase inhibitors. These include  but not limited to insomnia, hot flashes, mood changes, vaginal dryness, bone density loss, and weight gain. We strongly believe that the benefits far outweigh the risks. Patient understands these risks and consented to starting treatment. Planned treatment duration is 7 years.  Return to clinic in 3 months for survivorship care plan was up and for toxicity evaluation.  I spent 25 minutes talking to the patient of which more than half was spent in counseling and coordination of care.  No orders of the defined types were placed in this encounter.  The patient has a good understanding of the overall plan. she agrees with it. she will call with any problems that may develop before the next visit here.   Rulon Eisenmenger, MD 09/07/16

## 2016-09-07 NOTE — Assessment & Plan Note (Signed)
04/12/2016 Left breast biopsy/ lumpectomy: IDC grade 10.8 cm with DCIS grade 1, margins negative, ER 100%, PR 100%, HER-2 negative, Ki-67 15%  PET/CT scan: Negative for metastatic disease Sentinel lymph node study: 1/4 sentinel nodes positive with extracapsular extension  Mammaprint low-risk: 10 year risk of recurrence 10% Adjuvant radiation therapy 07/24/2016- 09/07/2016  Treatment plan: Adjuvant antiestrogen therapy with anastrozole 1 mg daily 7 years  Return to clinic in 3 months for survivorship care plan was up and for toxicity evaluation.

## 2016-09-10 ENCOUNTER — Ambulatory Visit: Payer: PPO | Attending: General Surgery | Admitting: Physical Therapy

## 2016-09-10 DIAGNOSIS — M25612 Stiffness of left shoulder, not elsewhere classified: Secondary | ICD-10-CM | POA: Insufficient documentation

## 2016-09-10 DIAGNOSIS — Z9189 Other specified personal risk factors, not elsewhere classified: Secondary | ICD-10-CM | POA: Diagnosis not present
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
## 2016-09-12 ENCOUNTER — Encounter: Payer: Self-pay | Admitting: Radiation Oncology

## 2016-09-12 NOTE — Progress Notes (Signed)
  Radiation Oncology         [phone]) [phone] ________________________________  Name: Ashley Cross MRN: [id number]  Date: 09/12/2016  DOB: 17-Feb-1955  End of Treatment Note  Diagnosis:    Clinical Stage IA, T1bNx, ER/PR positive grade 1 invasive ductal carcinoma with grade 1 DCIS of the left breast     Indication for treatment:  Curative       Radiation treatment dates:   07/24/16 - 09/07/16  Site/dose:    Left breast treated to 50.4 Gy with 28 fx of 1.8 Gy and a boost of 10 Gy with 5 fx of 2 Gy  Left Axilla treated to 50.4 Gy with 28 fx of 1.8 Gy   Beams/energy:   3D / 6X  Narrative: The patient tolerated radiation treatment relatively well.     Plan: The patient has completed radiation treatment. The patient will return to radiation oncology clinic for routine followup in one month. I advised them to call or return sooner if they have any questions or concerns related to their recovery or treatment.  ------------------------------------------------  Jodelle Gross, MD, PhD  This document serves as a record of services personally performed by Kyung Rudd, MD. It was created on his behalf by Linward Natal, a trained medical scribe. The creation of this record is based on the scribe's personal observations and the provider's statements to them. This document has been checked and approved by the attending provider.

## 2016-09-13 ENCOUNTER — Telehealth: Payer: Self-pay

## 2016-09-13 NOTE — Telephone Encounter (Signed)
Earliest appointment in Nov. Was for 11/13 at 2pm

## 2016-09-26 DIAGNOSIS — G5621 Lesion of ulnar nerve, right upper limb: Secondary | ICD-10-CM | POA: Diagnosis not present

## 2016-09-26 DIAGNOSIS — M961 Postlaminectomy syndrome, not elsewhere classified: Secondary | ICD-10-CM | POA: Diagnosis not present

## 2016-10-22 ENCOUNTER — Inpatient Hospital Stay: Admission: RE | Admit: 2016-10-22 | Payer: Self-pay | Source: Ambulatory Visit | Admitting: Radiation Oncology

## 2016-11-12 ENCOUNTER — Telehealth: Payer: Self-pay | Admitting: Hematology and Oncology

## 2016-11-12 DIAGNOSIS — I890 Lymphedema, not elsewhere classified: Secondary | ICD-10-CM | POA: Diagnosis not present

## 2016-11-12 DIAGNOSIS — C50012 Malignant neoplasm of nipple and areola, left female breast: Secondary | ICD-10-CM | POA: Diagnosis not present

## 2016-11-12 NOTE — Telephone Encounter (Signed)
I received information from Dr. Barry Dienes that the patient was having a lot of difficulty with anastrozole.  Apparently it is causing her emotional distress and has changed her personality completely. I recommended that we discontinue anastrozole therapy at this time. When she returns back in one month we can change her from anastrozole to  letrozole

## 2016-11-29 ENCOUNTER — Ambulatory Visit: Payer: PPO | Admitting: Physical Therapy

## 2016-12-12 ENCOUNTER — Telehealth: Payer: Self-pay

## 2016-12-12 NOTE — Telephone Encounter (Signed)
Left VM to remind patient of SCP visit on 12/18/16 at 2 pm.  Encouraged pt to call with questions.

## 2016-12-14 ENCOUNTER — Other Ambulatory Visit: Payer: Self-pay

## 2016-12-14 ENCOUNTER — Ambulatory Visit: Payer: PPO | Attending: General Surgery | Admitting: Physical Therapy

## 2016-12-14 DIAGNOSIS — R600 Localized edema: Secondary | ICD-10-CM | POA: Insufficient documentation

## 2016-12-14 DIAGNOSIS — M25512 Pain in left shoulder: Secondary | ICD-10-CM | POA: Insufficient documentation
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
## 2016-12-18 ENCOUNTER — Ambulatory Visit (HOSPITAL_BASED_OUTPATIENT_CLINIC_OR_DEPARTMENT_OTHER): Payer: PPO | Admitting: Adult Health

## 2016-12-18 ENCOUNTER — Encounter: Payer: Self-pay | Admitting: Adult Health

## 2016-12-18 VITALS — BP 138/77 | HR 109 | Temp 98.4°F | Resp 20 | Ht 66.0 in | Wt 132.3 lb

## 2016-12-18 DIAGNOSIS — Z170 Estrogen receptor positive status [ER+]: Secondary | ICD-10-CM | POA: Diagnosis not present

## 2016-12-18 DIAGNOSIS — E2839 Other primary ovarian failure: Secondary | ICD-10-CM

## 2016-12-18 DIAGNOSIS — M545 Low back pain: Secondary | ICD-10-CM | POA: Diagnosis not present

## 2016-12-18 DIAGNOSIS — C50512 Malignant neoplasm of lower-outer quadrant of left female breast: Secondary | ICD-10-CM

## 2016-12-18 DIAGNOSIS — G959 Disease of spinal cord, unspecified: Secondary | ICD-10-CM | POA: Diagnosis not present

## 2016-12-18 DIAGNOSIS — G5621 Lesion of ulnar nerve, right upper limb: Secondary | ICD-10-CM | POA: Diagnosis not present

## 2016-12-18 DIAGNOSIS — M961 Postlaminectomy syndrome, not elsewhere classified: Secondary | ICD-10-CM | POA: Diagnosis not present

## 2016-12-18 DIAGNOSIS — E559 Vitamin D deficiency, unspecified: Secondary | ICD-10-CM

## 2016-12-18 MED ORDER — LETROZOLE 2.5 MG PO TABS: 2.5000 mg | ORAL_TABLET | Freq: Every day | ORAL | 3 refills | Status: DC

## 2016-12-18 NOTE — Progress Notes (Signed)
CLINIC:  Survivorship   REASON FOR VISIT:  Routine follow-up post-treatment for a recent history of breast cancer.  BRIEF ONCOLOGIC HISTORY:    Malignant neoplasm of lower-outer quadrant of left breast of female, estrogen receptor positive ([place])   04/12/2016 Initial Diagnosis    Left breast biopsy/ lumpectomy: IDC grade 1, 0.8 cm with DCIS grade 1, margins negative, ER 100%, PR 100%, HER-2 negative, Ki-67 15%, T1b NX stage I a      05/21/2016 PET scan    Mild focal hypermetabolic in the left breast, no findings of metastatic disease. mild hypermetabolism is along the right costosternal junction favoring degenerative changes posttraumatic changes involving the right inferior scapular right sacrum bilateral pelvis and right lateral third and fourth ribs      05/29/2016 Surgery    1/4 sentinel lymph node positive with extracapsular extension       06/01/2016 Miscellaneous    Mammaprint low-risk: Average 10 year risk of recurrence 10%; Mammaprint index +0.389      07/24/2016 - 09/07/2016 Radiation Therapy    Adjuvant radiation therapy      09/2016 -  Anti-estrogen oral therapy    Anastrozole x 7 years       INTERVAL HISTORY:  Ms. Zynda presents to the [facility] today for our initial meeting to review her survivorship care plan detailing her treatment course for breast cancer, as well as monitoring long-term side effects of that treatment, education regarding health maintenance, screening, and overall wellness and health promotion.     Overall, Ms. Gardiner reports feeling quite well.  She could not tolerate Anastrozole and has stopped taking it at Dr. Marlowe Aschoff recommendation.  She had some emotional lability with the Anastrozole.  She recently got a breast cancer pink ribbon tattoo and is very happy with how well it turned out.      REVIEW OF SYSTEMS:  Review of Systems  Constitutional: Negative for appetite change, chills, fatigue, fever and unexpected weight change.    HENT:   Negative for hearing loss, lump/mass and sore throat.   Eyes: Negative for eye problems and icterus.  Respiratory: Negative for chest tightness, cough and shortness of breath.   Cardiovascular: Negative for chest pain, leg swelling and palpitations.  Gastrointestinal: Negative for abdominal distention, abdominal pain, constipation, diarrhea, nausea and vomiting.  Endocrine: Negative for hot flashes.  Musculoskeletal: Negative for arthralgias.  Skin: Negative for itching and rash.  Neurological: Negative for dizziness, extremity weakness and headaches.  Hematological: Negative for adenopathy. Does not bruise/bleed easily.  Psychiatric/Behavioral: Negative for depression. The patient is not nervous/anxious.   Breast: Denies any new nodularity, masses, tenderness, nipple changes, or nipple discharge.      ONCOLOGY TREATMENT TEAM:  1. Surgeon:  Dr. Barry Dienes at [facility] 2. Medical Oncologist: [name]  3. Radiation Oncologist: Dr.      Vennie Homans MEDICAL/SURGICAL HISTORY:  Past Medical History:  Diagnosis Date  . Anxiety   . Breast cancer ([place])    Left breast(nipple mass)  . Chronic back pain   . Cyst of left nipple   . Depression    Past Surgical History:  Procedure Laterality Date  . BACK SURGERY    . BREAST BIOPSY Left    core- neg  . BREAST SURGERY Left    breast biopsy  . CERVICAL CONE BIOPSY    . CERVICAL FUSION     times 2  . NECK SURGERY    . TONSILLECTOMY       ALLERGIES:  Allergies  Allergen Reactions  . Sulfa Antibiotics Itching and Rash  . Sulfonamide Derivatives Itching and Rash     CURRENT MEDICATIONS:  Outpatient Encounter Medications as of 12/18/2016  Medication Sig  . amitriptyline (ELAVIL) 50 MG tablet Take 50 mg by mouth at bedtime.  . diazepam (VALIUM) 2 MG tablet Take 2 mg by mouth at bedtime as needed for muscle spasms or anxiety.  . gabapentin (NEURONTIN) 600 MG tablet Take 1,200 mg by mouth 3 (three) times daily.  Marland Kitchen  ibuprofen (ADVIL,MOTRIN) 200 MG tablet Take 800 mg by mouth every 8 (eight) hours as needed (for pain).   Marland Kitchen oxymorphone (OPANA) 10 MG tablet Take 10 mg by mouth every 4 (four) hours as needed for pain.  Marland Kitchen anastrozole (ARIMIDEX) 1 MG tablet Take 1 tablet (1 mg total) by mouth daily. (Patient not taking: Reported on 12/14/2016)  . cholecalciferol (VITAMIN D) 1000 units tablet Take 1,000 Units daily by mouth.  . Vitamin D, Ergocalciferol, (DRISDOL) 50000 units CAPS capsule Take 1 capsule (50,000 Units total) by mouth every Monday. (Patient not taking: Reported on 12/14/2016)   No facility-administered encounter medications on file as of 12/18/2016.      ONCOLOGIC FAMILY HISTORY:  Family History  Problem Relation Age of Onset  . Breast cancer Mother 37  . Cancer Father        prostate  . Hypertension Father      GENETIC COUNSELING/TESTING: Not at this time  SOCIAL HISTORY:  Amylynn Fano is single and lives in [place], [place].  She has two children who live in [place].   She denies any current or history of tobacco, alcohol, or illicit drug use.     PHYSICAL EXAMINATION:  Vital Signs:   Vitals:   12/18/16 1404  BP: 138/77  Pulse: (!) 109  Resp: 20  Temp: 98.4 F (36.9 C)  SpO2: 100%   Filed Weights   12/18/16 1404  Weight: 132 lb 4.8 oz (60 kg)   General: Well-nourished, well-appearing female in no acute distress.  She is unaccompanied today.   HEENT: Head is normocephalic.  Pupils equal and reactive to light. Conjunctivae clear without exudate.  Sclerae anicteric. Oral mucosa is pink, moist.  Oropharynx is pink without lesions or erythema.  Lymph: No cervical, supraclavicular, or infraclavicular lymphadenopathy noted on palpation.  Cardiovascular: Regular rate and rhythm.Marland Kitchen Respiratory: Clear to auscultation bilaterally. Chest expansion symmetric; breathing non-labored.  Breast: Left breast s/p lumpectomy, mild scar tissue present, healing  well, no nodules or masses noted, right breast without nodules, masses, skin/nipple changes GI: Abdomen soft and round; non-tender, non-distended. Bowel sounds normoactive.  GU: Deferred.  Neuro: No focal deficits. Steady gait.  Psych: Mood and affect normal and appropriate for situation.  Extremities: No edema. MSK: No focal spinal tenderness to palpation.  Full range of motion in bilateral upper extremities Skin: Warm and dry.  LABORATORY DATA:  None for this visit.  DIAGNOSTIC IMAGING:  None for this visit.      ASSESSMENT AND PLAN:  Ms.. Rayo is a pleasant 61 y.o. female with Stage IA left breast invasive ductal carcinoma, ER+/PR+/HER2-, diagnosed in 04/2016, treated with lumpectomy, adjuvant radiation therapy, and anti-estrogen therapy with Anastrozole followed by Letrozole beginning in 09/2016.  She presents to the Survivorship Clinic for our initial meeting and routine follow-up post-completion of treatment for breast cancer.    1. Stage IA left breast cancer:  Ms. Strohecker is continuing to recover from definitive treatment for breast cancer.  She will follow-up with her medical oncologist, [name] in 2 months with history and physical exam per surveillance protocol.  Bradleigh did not tolerate Anastrozole.  Today, I started her on Letrozole.  We reviewed this change in detail including risks and benefits.  Today, a comprehensive survivorship care plan and treatment summary was reviewed with the patient today detailing her breast cancer diagnosis, treatment course, potential late/long-term effects of treatment, appropriate follow-up care with recommendations for the future, and patient education resources.  A copy of this summary, along with a letter will be sent to the patient's primary care provider via mail/fax/In Basket message after today's visit.    2. Bone health:  Given Ms. Brenn's age/history of breast cancer and her current treatment regimen including anti-estrogen therapy with  Letrozole, she is at risk for bone demineralization.  She says she has undergone bone density a few years ago, but she cannot recall where she had it or the results.  I went ahead and ordered a bone density for 03/2017 when her next mammogram is due at [facility].  She was given education on specific activities to promote bone health.  3. Cancer screening:  Due to Ms. Kepple's history and her age, she should receive screening for skin cancers, colon cancer, and gynecologic cancers.  The information and recommendations are listed on the patient's comprehensive care plan/treatment summary and were reviewed in detail with the patient.    4. Health maintenance and wellness promotion: Ms. Chilton was encouraged to consume 5-7 servings of fruits and vegetables per day. We reviewed the "Nutrition Rainbow" handout, as well as the handout "Take Control of Your Health and Reduce Your Cancer Risk" from the [place].  She was also encouraged to engage in moderate to vigorous exercise for 30 minutes per day most days of the week. We discussed the [employer] fitness program, which is designed for cancer survivors to help them become more physically fit after cancer treatments.  She was instructed to limit her alcohol consumption and continue to abstain from tobacco use.     5. Support services/counseling: It is not uncommon for this period of the patient's cancer care trajectory to be one of many emotions and stressors.  We discussed an opportunity for her to participate in the next session of [facility] ("Finding Your New Normal") support group series designed for patients after they have completed treatment.   Ms. Mcenroe was encouraged to take advantage of our many other support services programs, support groups, and/or counseling in coping with her new life as a cancer survivor after completing anti-cancer treatment.  She was offered support today through active listening and expressive supportive counseling.  She  was given information regarding our available services and encouraged to contact me with any questions or for help enrolling in any of our support group/programs.    Dispo:   -Return to cancer center for f/u with [name] in 02/2017 -Mammogram due in 03/2017 -Bone density 03/2017 -Follow up with Dr. Barry Dienes in 05/2017 -She is welcome to return back to the Survivorship Clinic at any time; no additional follow-up needed at this time.  -Consider referral back to survivorship as a long-term survivor for continued surveillance  A total of (30) minutes of face-to-face time was spent with this patient with greater than 50% of that time in counseling and care-coordination.   Gardenia Phlegm, NP Survivorship Program [place] [phone]   Note: PRIMARY CARE PROVIDER [place], Nino Glow, [place] [phone]

## 2016-12-24 DIAGNOSIS — M545 Low back pain: Secondary | ICD-10-CM | POA: Diagnosis not present

## 2017-01-01 ENCOUNTER — Telehealth: Payer: Self-pay | Admitting: Internal Medicine

## 2017-01-01 ENCOUNTER — Other Ambulatory Visit: Payer: Self-pay | Admitting: Internal Medicine

## 2017-01-01 ENCOUNTER — Other Ambulatory Visit: Payer: Self-pay

## 2017-01-01 NOTE — Telephone Encounter (Signed)
Copied from [place]. Topic: Quick Communication - See Telephone Encounter >> Jan 01, 2017 10:56 AM Conception Chancy, NT wrote: CRM for notification. See Telephone encounter for:  01/01/17.  Pt states her last pill runs out today, she called pharmacy that is on file and they advised her to call the office and request a refill

## 2017-01-01 NOTE — Telephone Encounter (Signed)
Left message that pt. Needs to schedule an appointment to obtain refills.

## 2017-01-01 NOTE — Telephone Encounter (Unsigned)
Copied from [place]. Topic: Quick Communication - See Telephone Encounter >> Jan 01, 2017 10:56 AM Conception Chancy, NT wrote: CRM for notification. See Telephone encounter for:  01/01/17. >> Jan 01, 2017  1:30 PM Yvette Rack wrote: Nurse leslie advised pt if she schedule an OV she would fill medicine

## 2017-01-02 NOTE — Telephone Encounter (Signed)
Hi   OK to refill until appt please send in 30 day supply to her pharmacy for me  Amitriptyline 50 mg qhs #30 no refills   Thanks tMS

## 2017-01-02 NOTE — Telephone Encounter (Signed)
Patient scheduled appointment for 01/25/17 with Dr. Terese Door, patient  is out of Amitriptyline 50 mg ok to fill til appointment?

## 2017-01-02 NOTE — Telephone Encounter (Signed)
Patient has appt on 01/25/17, can she a refill sent to last until she is seen

## 2017-01-03 ENCOUNTER — Ambulatory Visit: Payer: PPO | Admitting: Physical Therapy

## 2017-01-03 MED ORDER — AMITRIPTYLINE HCL 50 MG PO TABS: 50.0000 mg | ORAL_TABLET | Freq: Every day | ORAL | 0 refills | Status: DC

## 2017-01-03 NOTE — Addendum Note (Signed)
Addended by: Nanci Pina on: 01/03/2017 12:31 PM   Modules accepted: Orders

## 2017-01-03 NOTE — Telephone Encounter (Signed)
30 day supply medication called to pharmacy and patient notified.

## 2017-01-09 ENCOUNTER — Encounter: Payer: PPO | Admitting: Physical Therapy

## 2017-01-16 ENCOUNTER — Encounter: Payer: PPO | Admitting: Physical Therapy

## 2017-01-17 DIAGNOSIS — M545 Low back pain: Secondary | ICD-10-CM | POA: Diagnosis not present

## 2017-01-25 ENCOUNTER — Ambulatory Visit: Payer: PPO | Admitting: Internal Medicine

## 2017-01-25 ENCOUNTER — Encounter: Payer: Self-pay | Admitting: Internal Medicine

## 2017-01-25 VITALS — BP 144/76 | HR 103 | Temp 98.7°F | Resp 95 | Ht 66.0 in | Wt 133.5 lb

## 2017-01-25 DIAGNOSIS — R739 Hyperglycemia, unspecified: Secondary | ICD-10-CM

## 2017-01-25 DIAGNOSIS — Z1159 Encounter for screening for other viral diseases: Secondary | ICD-10-CM

## 2017-01-25 DIAGNOSIS — Z13220 Encounter for screening for lipoid disorders: Secondary | ICD-10-CM

## 2017-01-25 DIAGNOSIS — R7303 Prediabetes: Secondary | ICD-10-CM | POA: Diagnosis not present

## 2017-01-25 DIAGNOSIS — G4700 Insomnia, unspecified: Secondary | ICD-10-CM | POA: Diagnosis not present

## 2017-01-25 DIAGNOSIS — Z23 Encounter for immunization: Secondary | ICD-10-CM

## 2017-01-25 DIAGNOSIS — R030 Elevated blood-pressure reading, without diagnosis of hypertension: Secondary | ICD-10-CM | POA: Diagnosis not present

## 2017-01-25 DIAGNOSIS — Z720 Tobacco use: Secondary | ICD-10-CM

## 2017-01-25 DIAGNOSIS — E559 Vitamin D deficiency, unspecified: Secondary | ICD-10-CM | POA: Diagnosis not present

## 2017-01-25 DIAGNOSIS — F339 Major depressive disorder, recurrent, unspecified: Secondary | ICD-10-CM

## 2017-01-25 DIAGNOSIS — Z1329 Encounter for screening for other suspected endocrine disorder: Secondary | ICD-10-CM

## 2017-01-25 DIAGNOSIS — Z853 Personal history of malignant neoplasm of breast: Secondary | ICD-10-CM | POA: Diagnosis not present

## 2017-01-25 DIAGNOSIS — G894 Chronic pain syndrome: Secondary | ICD-10-CM

## 2017-01-25 DIAGNOSIS — Z0000 Encounter for general adult medical examination without abnormal findings: Secondary | ICD-10-CM | POA: Diagnosis not present

## 2017-01-25 LAB — HEMOGLOBIN A1C: Hgb A1c MFr Bld: 5.7 % (ref 4.6–6.5)

## 2017-01-25 LAB — LIPID PANEL
Cholesterol: 223 mg/dL — ABNORMAL HIGH (ref 0–200)
HDL: 150.8 mg/dL (ref >39.00)
LDL Cholesterol: 59 mg/dL (ref 0–99)
NonHDL: 71.9
TRIGLYCERIDES: 66 mg/dL (ref 0.0–149.0)
Total CHOL/HDL Ratio: 1
VLDL: 13.2 mg/dL (ref 0.0–40.0)

## 2017-01-25 LAB — COMPREHENSIVE METABOLIC PANEL
ALBUMIN: 4.2 g/dL (ref 3.5–5.2)
ALT: 21 U/L (ref 0–35)
AST: 18 U/L (ref 0–37)
Alkaline Phosphatase: 82 U/L (ref 39–117)
BUN: 10 mg/dL (ref 6–23)
CHLORIDE: 99 meq/L (ref 96–112)
CO2: 29 meq/L (ref 19–32)
CREATININE: 0.5 mg/dL (ref 0.40–1.20)
Calcium: 9.2 mg/dL (ref 8.4–10.5)
GFR: 133.49 mL/min (ref >60.00)
Glucose, Bld: 87 mg/dL (ref 70–99)
Potassium: 3.9 mEq/L (ref 3.5–5.1)
SODIUM: 135 meq/L (ref 135–145)
Total Bilirubin: 0.4 mg/dL (ref 0.2–1.2)
Total Protein: 7.4 g/dL (ref 6.0–8.3)

## 2017-01-25 LAB — CBC WITH DIFFERENTIAL/PLATELET
BASOS PCT: 0.3 % (ref 0.0–3.0)
Basophils Absolute: 0 10*3/uL (ref 0.0–0.1)
EOS ABS: 0 10*3/uL (ref 0.0–0.7)
EOS PCT: 0.2 % (ref 0.0–5.0)
HEMATOCRIT: 44.6 % (ref 36.0–46.0)
HEMOGLOBIN: 15.2 g/dL — AB (ref 12.0–15.0)
Lymphocytes Relative: 6.6 % — ABNORMAL LOW (ref 12.0–46.0)
Lymphs Abs: 0.6 10*3/uL — ABNORMAL LOW (ref 0.7–4.0)
MCHC: 34.1 g/dL (ref 30.0–36.0)
MCV: 99.2 fl (ref 78.0–100.0)
MONO ABS: 0.5 10*3/uL (ref 0.1–1.0)
Monocytes Relative: 6.2 % (ref 3.0–12.0)
NEUTROS ABS: 7.6 10*3/uL (ref 1.4–7.7)
Neutrophils Relative %: 86.7 % — ABNORMAL HIGH (ref 43.0–77.0)
PLATELETS: 241 10*3/uL (ref 150.0–400.0)
RBC: 4.49 Mil/uL (ref 3.87–5.11)
RDW: 13.3 % (ref 11.5–15.5)
WBC: 8.8 10*3/uL (ref 4.0–10.5)

## 2017-01-25 LAB — URINALYSIS, ROUTINE W REFLEX MICROSCOPIC
BILIRUBIN URINE: NEGATIVE
HGB URINE DIPSTICK: NEGATIVE
Ketones, ur: NEGATIVE
LEUKOCYTES UA: NEGATIVE
NITRITE: NEGATIVE
Specific Gravity, Urine: 1.01 (ref 1.000–1.030)
Total Protein, Urine: NEGATIVE
Urine Glucose: NEGATIVE
Urobilinogen, UA: 0.2 (ref 0.0–1.0)
pH: 7 (ref 5.0–8.0)

## 2017-01-25 LAB — T4, FREE: Free T4: 0.82 ng/dL (ref 0.60–1.60)

## 2017-01-25 LAB — TSH: TSH: 0.95 u[IU]/mL (ref 0.35–4.50)

## 2017-01-25 MED ORDER — AMITRIPTYLINE HCL 50 MG PO TABS: 50.0000 mg | ORAL_TABLET | Freq: Every day | ORAL | 5 refills | Status: DC

## 2017-01-25 NOTE — Patient Instructions (Addendum)
Follow up in 3-4 months sooner if needed  Try cough drops, Robitussin DM, and tear with honey and lemon  Happy Holidays   Hoarseness Hoarseness is any abnormal change in your voice.Hoarseness can make it difficult to speak. Your voice may sound raspy, breathy, or strained. Hoarseness is caused by a problem with the vocal cords. The vocal cords are two bands of tissue inside your voice box (larynx). When you speak, your vocal cords move back and forth to create sound. The surfaces of your vocal cords need to be smooth for your voice to sound clear. Swelling or lumps on the vocal cords can cause hoarseness. Common causes of vocal cord problems include:  Upper airway infection.  A long-term cough.  Straining or overusing your voice.  Smoking.  Allergies.  Vocal cord growths.  Stomach acids that flow up from your stomach and irritate your vocal cords (gastroesophageal reflux).  Follow these instructions at home: Watch your condition for any changes. To ease any discomfort that you feel:  Rest your voice. Do not whisper. Whispering can cause muscle strain.  Do not speak in a loud or harsh voice that makes your hoarseness worse.  Do not use any tobacco products, including cigarettes, chewing tobacco, or electronic cigarettes. If you need help quitting, ask your health care provider.  Avoid secondhand smoke.  Do not eat foods that give you heartburn. Heartburn can make gastroesophageal reflux worse.  Do not drink coffee.  Do not drink alcohol.  Drink enough fluids to keep your urine clear or pale yellow.  Use a humidifier if the air in your home is dry.  Contact a health care provider if:  You have hoarseness that lasts longer than 3 weeks.  You almost lose or completelylose your voice for longer than 3 days.  You have pain when you swallow or try to talk.  You feel a lump in your neck. Get help right away if:  You have trouble swallowing.  You feel as though you  are choking when you swallow.  You cough up blood or vomit blood.  You have trouble breathing. This information is not intended to replace advice given to you by your health care provider. Make sure you discuss any questions you have with your health care provider. Document Released: 01/05/2005 Document Revised: 06/30/2015 Document Reviewed: 01/13/2014 Elsevier Interactive Patient Education  [employer].

## 2017-01-26 LAB — HEPATITIS C ANTIBODY
HEP C AB: NONREACTIVE
SIGNAL TO CUT-OFF: 0.2 (ref <1.00)

## 2017-01-26 LAB — HEPATITIS B CORE ANTIBODY, TOTAL: HEP B C TOTAL AB: NONREACTIVE

## 2017-01-26 LAB — HEPATITIS B SURFACE ANTIBODY, QUANTITATIVE: Hepatitis B-Post: <5 m[IU]/mL — ABNORMAL LOW (ref >=10)

## 2017-01-26 LAB — HEPATITIS B SURFACE ANTIGEN: Hepatitis B Surface Ag: NONREACTIVE

## 2017-01-27 ENCOUNTER — Encounter: Payer: Self-pay | Admitting: Internal Medicine

## 2017-01-27 DIAGNOSIS — E559 Vitamin D deficiency, unspecified: Secondary | ICD-10-CM | POA: Insufficient documentation

## 2017-01-27 DIAGNOSIS — G894 Chronic pain syndrome: Secondary | ICD-10-CM | POA: Insufficient documentation

## 2017-01-27 DIAGNOSIS — Z720 Tobacco use: Secondary | ICD-10-CM

## 2017-01-27 DIAGNOSIS — G4700 Insomnia, unspecified: Secondary | ICD-10-CM | POA: Insufficient documentation

## 2017-01-27 DIAGNOSIS — R7303 Prediabetes: Secondary | ICD-10-CM | POA: Insufficient documentation

## 2017-01-27 DIAGNOSIS — F17200 Nicotine dependence, unspecified, uncomplicated: Secondary | ICD-10-CM | POA: Insufficient documentation

## 2017-01-27 DIAGNOSIS — Z853 Personal history of malignant neoplasm of breast: Secondary | ICD-10-CM | POA: Insufficient documentation

## 2017-01-27 DIAGNOSIS — F5104 Psychophysiologic insomnia: Secondary | ICD-10-CM | POA: Insufficient documentation

## 2017-01-27 NOTE — Progress Notes (Signed)
Chief Complaint  Patient presents with  . Establish Care    Former [facility] pt from [facility] with Dr. [facility] former pt at [place]. H/o left nipple breast cancer 1A dx'ed 2018 doing well. Did c/o left axilla pain from lymph node bx and swelling and prolonged healing time but doing better. Surgeon is Dr. Barry Dienes. She is in good spirits about her cancer now and tolerating Femara but did not tolerate Anastroazole and states if affected her mood. She also recently got a tattoo of the breast cancer symbol in pink on her right forearm 2. Insomnia. She is taking Amitriptyline which helps some and requests refills.  3. She would like the flu shot  4. H/o chronic pain epidural shots to back and hip Dr. Maryjean Ka helping.    Review of Systems  Constitutional: Negative for weight loss.  HENT:       +hoarse voice x 3 days  Eyes:       No vision problems  Respiratory: Positive for cough.        Dry cough x 2 days   Cardiovascular: Negative for chest pain.  Gastrointestinal: Negative for abdominal pain.  Musculoskeletal: Positive for back pain.  Skin:       +new tattoo right arm  Neurological: Positive for sensory change.       +numbness in upper extremities h/o chronic surgeries neck and back denies carpal tunnel syndrome thinks 2/2 spinal stenosis, myelopathy per pt   Psychiatric/Behavioral: Negative for depression. The patient has insomnia.    Past Medical History:  Diagnosis Date  . Anxiety   . Breast cancer ([place])    Left breast(nipple mass) 2018  . Chicken pox   . Chronic back pain   . Colon polyps   . Cyst of left nipple   . Depression   . Insomnia    Past Surgical History:  Procedure Laterality Date  . BACK SURGERY     x 3 (2010 x 2; 2013 x 1)   . BREAST BIOPSY Left    core- neg  . BREAST SURGERY Left    breast biopsy  . CERVICAL CONE BIOPSY    . CERVICAL FUSION     times 2  . COLONOSCOPY WITH PROPOFOL N/A 04/18/2015   Procedure: COLONOSCOPY WITH PROPOFOL;  Surgeon:  [name], MD;  Location: [facility];  Service: Endoscopy;  Laterality: N/A;  . ELBOW SURGERY     2014 b/l elbows per pt ulnar nerve   . MASS EXCISION Left 04/12/2016   Procedure: EXCISION OF LEFT NIPPLE MASS;  Surgeon: Stark Klein, MD;  Location: [place];  Service: General;  Laterality: Left;  . NECK SURGERY    . POSTERIOR CERVICAL FUSION/FORAMINOTOMY  03/07/2011   Procedure: POSTERIOR CERVICAL FUSION/FORAMINOTOMY LEVEL 4;  Surgeon: Eustace Moore, MD;  Location: [place] [facility];  Service: Neurosurgery;  Laterality: N/A;  cervical three to thoracic one posterior cervical fusion   . SENTINEL NODE BIOPSY Left 05/29/2016   Procedure: LEFT SENTINEL LYMPH NODE BIOPSY;  Surgeon: Stark Klein, MD;  Location: [place];  Service: General;  Laterality: Left;  . TONSILLECTOMY     Family History  Problem Relation Age of Onset  . Breast cancer Mother 32  . Hypertension Mother   . Cancer Father        prostate  . Hypertension Father   . Depression Daughter   . Hypertension Maternal Grandmother    Social History   Socioeconomic History  .  Marital status: Single    Spouse name: Not on file  . Number of children: Not on file  . Years of education: Not on file  . Highest education level: Not on file  Social Needs  . Financial resource strain: Not on file  . Food insecurity - worry: Not on file  . Food insecurity - inability: Not on file  . Transportation needs - medical: Not on file  . Transportation needs - non-medical: Not on file  Occupational History  . Not on file  Tobacco Use  . Smoking status: Current Every Day Smoker    Packs/day: 1.00    Years: 30.00    Pack years: 30.00    Types: Cigarettes  . Smokeless tobacco: Never Used  Substance and Sexual Activity  . Alcohol use: Yes    Comment: social  . Drug use: Yes    Comment: on opana since jan 2018  . Sexual activity: Not on file  Other Topics Concern  . Not on file  Social History Narrative   ** Merged  History Encounter **       Current Meds  Medication Sig  . amitriptyline (ELAVIL) 50 MG tablet Take 1 tablet (50 mg total) by mouth at bedtime.  . diazepam (VALIUM) 2 MG tablet Take 2 mg by mouth at bedtime as needed for muscle spasms or anxiety.  . gabapentin (NEURONTIN) 600 MG tablet Take 1,200 mg by mouth 3 (three) times daily.  Marland Kitchen ibuprofen (ADVIL,MOTRIN) 200 MG tablet Take 800 mg by mouth every 8 (eight) hours as needed (for pain).   Marland Kitchen letrozole (FEMARA) 2.5 MG tablet Take 1 tablet (2.5 mg total) daily by mouth.  Marland Kitchen oxymorphone (OPANA) 10 MG tablet Take 10 mg by mouth 2 (two) times daily.   . Vitamin D, Ergocalciferol, (DRISDOL) 50000 units CAPS capsule Take 1 capsule (50,000 Units total) by mouth every Monday.  . [DISCONTINUED] amitriptyline (ELAVIL) 50 MG tablet Take 1 tablet (50 mg total) by mouth at bedtime.   Allergies  Allergen Reactions  . Sulfa Antibiotics Itching and Rash  . Sulfonamide Derivatives Itching and Rash   Recent Results (from the past 2160 hour(s))  Comprehensive metabolic panel     Status: None   Collection Time: 01/25/17 12:08 PM  Result Value Ref Range   Sodium 135 135 - 145 mEq/L   Potassium 3.9 3.5 - 5.1 mEq/L   Chloride 99 96 - 112 mEq/L   CO2 29 19 - 32 mEq/L   Glucose, Bld 87 70 - 99 mg/dL   BUN 10 6 - 23 mg/dL   Creatinine, Ser 0.50 0.40 - 1.20 mg/dL   Total Bilirubin 0.4 0.2 - 1.2 mg/dL   Alkaline Phosphatase 82 39 - 117 U/L   AST 18 0 - 37 U/L   ALT 21 0 - 35 U/L   Total Protein 7.4 6.0 - 8.3 g/dL   Albumin 4.2 3.5 - 5.2 g/dL   Calcium 9.2 8.4 - 10.5 mg/dL   GFR 133.49 >60.00 mL/min  CBC with Differential/Platelet     Status: Abnormal   Collection Time: 01/25/17 12:08 PM  Result Value Ref Range   WBC 8.8 4.0 - 10.5 K/uL    Comment: A Manual Differential was performed and is consistent with the Automated Differential.   RBC 4.49 3.87 - 5.11 Mil/uL   Hemoglobin 15.2 (H) 12.0 - 15.0 g/dL   HCT 44.6 36.0 - 46.0 %   MCV 99.2 78.0 - 100.0 fl     MCHC  34.1 30.0 - 36.0 g/dL   RDW 13.3 11.5 - 15.5 %   Platelets 241.0 150.0 - 400.0 K/uL   Neutrophils Relative % 86.7 (H) 43.0 - 77.0 %   Lymphocytes Relative 6.6 (L) 12.0 - 46.0 %   Monocytes Relative 6.2 3.0 - 12.0 %   Eosinophils Relative 0.2 0.0 - 5.0 %   Basophils Relative 0.3 0.0 - 3.0 %   Neutro Abs 7.6 1.4 - 7.7 K/uL   Lymphs Abs 0.6 (L) 0.7 - 4.0 K/uL   Monocytes Absolute 0.5 0.1 - 1.0 K/uL   Eosinophils Absolute 0.0 0.0 - 0.7 K/uL   Basophils Absolute 0.0 0.0 - 0.1 K/uL  TSH     Status: None   Collection Time: 01/25/17 12:08 PM  Result Value Ref Range   TSH 0.95 0.35 - 4.50 uIU/mL  T4, free     Status: None   Collection Time: 01/25/17 12:08 PM  Result Value Ref Range   Free T4 0.82 0.60 - 1.60 ng/dL    Comment: Specimens from patients who are undergoing biotin therapy and /or ingesting biotin supplements may contain high levels of biotin.  The higher biotin concentration in these specimens interferes with this Free T4 assay.  Specimens that contain high levels  of biotin may cause false high results for this Free T4 assay.  Please interpret results in light of the total clinical presentation of the patient.    Urinalysis, Routine w reflex microscopic     Status: None   Collection Time: 01/25/17 12:08 PM  Result Value Ref Range   Color, Urine YELLOW Yellow;Lt. Yellow   APPearance CLEAR Clear   Specific Gravity, Urine 1.010 1.000 - 1.030   pH 7.0 5.0 - 8.0   Total Protein, Urine NEGATIVE Negative   Urine Glucose NEGATIVE Negative   Ketones, ur NEGATIVE Negative   Bilirubin Urine NEGATIVE Negative   Hgb urine dipstick NEGATIVE Negative   Urobilinogen, UA 0.2 0.0 - 1.0   Leukocytes, UA NEGATIVE Negative   Nitrite NEGATIVE Negative   WBC, UA 0-2/hpf 0-2/hpf   RBC / HPF 0-2/hpf 0-2/hpf   Squamous Epithelial / LPF Rare(0-4/hpf) Rare(0-4/hpf)  Lipid panel     Status: Abnormal   Collection Time: 01/25/17 12:08 PM  Result Value Ref Range   Cholesterol 223 (H) 0 - 200  mg/dL    Comment: ATP III Classification       Desirable:  < 200 mg/dL               Borderline High:  200 - 239 mg/dL          High:  > = 240 mg/dL   Triglycerides 66.0 0.0 - 149.0 mg/dL    Comment: Normal:  <150 mg/dLBorderline High:  150 - 199 mg/dL   HDL 150.80 >39.00 mg/dL   VLDL 13.2 0.0 - 40.0 mg/dL   LDL Cholesterol 59 0 - 99 mg/dL   Total CHOL/HDL Ratio 1     Comment:                Men          Women1/2 Average Risk     3.4          3.3Average Risk          5.0          4.42X Average Risk          9.6          7.13X Average Risk  15.0          11.0                       NonHDL 71.90     Comment: NOTE:  Non-HDL goal should be 30 mg/dL higher than patient's LDL goal (i.e. LDL goal of < 70 mg/dL, would have non-HDL goal of < 100 mg/dL)  Hemoglobin A1c     Status: None   Collection Time: 01/25/17 12:08 PM  Result Value Ref Range   Hgb A1c MFr Bld 5.7 4.6 - 6.5 %    Comment: Glycemic Control Guidelines for People with Diabetes:Non Diabetic:  <6%Goal of Therapy: <7%Additional Action Suggested:  >8%   Hepatitis B surface antibody     Status: Abnormal   Collection Time: 01/25/17 12:08 PM  Result Value Ref Range   Hepatitis B-Post <5 (L) > OR = 10 mIU/mL    Comment: . Patient does not have immunity to hepatitis B virus. . For additional information, please refer to http://education.[facility].com/faq/FAQ105 (This link is being provided for informational/ educational purposes only).   Hepatitis B core antibody, total     Status: None   Collection Time: 01/25/17 12:08 PM  Result Value Ref Range   Hep B Core Total Ab NON-REACTIVE NON-REACTI  Hepatitis C antibody     Status: None   Collection Time: 01/25/17 12:08 PM  Result Value Ref Range   Hepatitis C Ab NON-REACTIVE NON-REACTI   SIGNAL TO CUT-OFF 0.20 <1.00  Hepatitis B surface antigen     Status: None   Collection Time: 01/25/17 12:08 PM  Result Value Ref Range   Hepatitis B Surface Ag NON-REACTIVE NON-REACTI    Objective  Body mass index is 21.55 kg/m. Wt Readings from Last 3 Encounters:  01/25/17 133 lb 8 oz (60.6 kg)  12/18/16 132 lb 4.8 oz (60 kg)  09/07/16 131 lb 14.4 oz (59.8 kg)   Temp Readings from Last 3 Encounters:  01/25/17 98.7 F (37.1 C) (Oral)  12/18/16 98.4 F (36.9 C) (Oral)  09/07/16 98.3 F (36.8 C) (Oral)   BP Readings from Last 3 Encounters:  01/25/17 (!) 144/76  12/18/16 138/77  09/07/16 (!) 151/78   Pulse Readings from Last 3 Encounters:  01/25/17 (!) 103  12/18/16 (!) 109  09/07/16 91   O2 sat room air 95% Physical Exam  Constitutional: She is oriented to person, place, and time and well-developed, well-nourished, and in no distress.  HENT:  Head: Atraumatic.  Mouth/Throat: Oropharynx is clear and moist and mucous membranes are normal.  +hoarse voice   Eyes: Conjunctivae are normal. Pupils are equal, round, and reactive to light.  Cardiovascular: Regular rhythm and normal heart sounds. Tachycardia present.  No murmur heard. Neg leg edema b/l   Pulmonary/Chest: Effort normal.  Abdominal: Soft. Bowel sounds are normal. There is no tenderness.  Neurological: She is alert and oriented to person, place, and time. Gait normal. Gait normal.  Skin: Skin is warm, dry and intact.     Pink tattoo breast cancer symbol right forearm  Psychiatric: Mood, memory, affect and judgment normal.  Nursing note and vitals reviewed.   Assessment   1. H/o L nipple breast cancer 1A 2018  2. Insomnia/depression 3. Chronic neck and back pain with radiculopathy  4. Tobacco abuse 1 ppd she is not interested in quitting at this time and wants to disc latter date  5. Elevated BP  6. HM 7. Vit D def   Plan  1.  F/u with H/o and surgery  On femara tolerating  2. Refilled amitriptyline  Do PHQ 9 at f/u  3. Continue to f/u with chronic pain specialists and other doctors participating in care  4.  rec cessation Continue to reassess for cessation 5.  Monitor esp  with +FH  If elevated next visit disc tx  6.   Given flu shot today  Tdap UTD Will disc shingrix at f/u  pna 23 vaccine will need in future   Check lipid today had black coffee this am   UTD colonoscopy 04/18/15 h/o polyps  UTD mammogram  Pap review [facility] records requested today   7.  Check vit D level   Other MDs participating in pt's care  1. Dr. Dellie Burns  2. Dr. Sherley Bounds Neurology  3. Dr. Maryjean Ka  4. [name]   Provider: Dr. Olivia Mackie McLean-Scocuzza-Internal Medicine

## 2017-01-28 ENCOUNTER — Other Ambulatory Visit (INDEPENDENT_AMBULATORY_CARE_PROVIDER_SITE_OTHER): Payer: PPO

## 2017-01-28 ENCOUNTER — Encounter: Payer: Self-pay | Admitting: Internal Medicine

## 2017-01-28 DIAGNOSIS — E559 Vitamin D deficiency, unspecified: Secondary | ICD-10-CM

## 2017-01-28 LAB — VITAMIN D 25 HYDROXY (VIT D DEFICIENCY, FRACTURES): VITD: 31 ng/mL (ref 30.00–100.00)

## 2017-02-12 ENCOUNTER — Other Ambulatory Visit: Payer: Self-pay

## 2017-02-12 DIAGNOSIS — Z170 Estrogen receptor positive status [ER+]: Principal | ICD-10-CM

## 2017-02-12 DIAGNOSIS — C50512 Malignant neoplasm of lower-outer quadrant of left female breast: Secondary | ICD-10-CM

## 2017-02-12 NOTE — Assessment & Plan Note (Signed)
04/12/2016 Left breast biopsy/ lumpectomy: IDC grade 10.8 cm with DCIS grade 1, margins negative, ER 100%, PR 100%, HER-2 negative, Ki-67 15%  PET/CT scan: Negative for metastatic disease Sentinel lymph node study: 1/4 sentinel nodes positive with extracapsular extension  Mammaprint low-risk: 10 year risk of recurrence 10% Adjuvant radiation therapy 07/24/2016- 09/07/2016  Treatment plan: Adjuvant antiestrogen therapy with anastrozole 1 mg daily 7 years  Anastrozole toxicities:  Return to clinic in 1 year for follow-up

## 2017-02-13 ENCOUNTER — Inpatient Hospital Stay: Payer: PPO

## 2017-02-13 ENCOUNTER — Inpatient Hospital Stay: Payer: PPO | Attending: Hematology and Oncology | Admitting: Hematology and Oncology

## 2017-02-13 ENCOUNTER — Telehealth: Payer: Self-pay | Admitting: Hematology and Oncology

## 2017-02-13 DIAGNOSIS — Z170 Estrogen receptor positive status [ER+]: Secondary | ICD-10-CM

## 2017-02-13 DIAGNOSIS — Z923 Personal history of irradiation: Secondary | ICD-10-CM | POA: Diagnosis not present

## 2017-02-13 DIAGNOSIS — C50512 Malignant neoplasm of lower-outer quadrant of left female breast: Secondary | ICD-10-CM | POA: Diagnosis not present

## 2017-02-13 DIAGNOSIS — Z79899 Other long term (current) drug therapy: Secondary | ICD-10-CM | POA: Insufficient documentation

## 2017-02-13 DIAGNOSIS — R232 Flushing: Secondary | ICD-10-CM | POA: Diagnosis not present

## 2017-02-13 DIAGNOSIS — Z79811 Long term (current) use of aromatase inhibitors: Secondary | ICD-10-CM | POA: Insufficient documentation

## 2017-02-13 LAB — CBC WITH DIFFERENTIAL (CANCER CENTER ONLY)
Abs Granulocyte: 5.8 10*3/uL (ref 1.5–6.5)
BASOS PCT: 1 %
Basophils Absolute: 0.1 10*3/uL (ref 0.0–0.1)
EOS PCT: 0 %
Eosinophils Absolute: 0 10*3/uL (ref 0.0–0.5)
HCT: 44.1 % (ref 34.8–46.6)
HEMOGLOBIN: 14.9 g/dL (ref 11.6–15.9)
LYMPHS ABS: 0.7 10*3/uL — AB (ref 0.9–3.3)
Lymphocytes Relative: 11 %
MCH: 33.7 pg (ref 25.1–34.0)
MCHC: 33.9 g/dL (ref 31.5–36.0)
MCV: 99.6 fL (ref 79.5–101.0)
MONO ABS: 0.4 10*3/uL (ref 0.1–0.9)
MONOS PCT: 6 %
Neutro Abs: 5.8 10*3/uL (ref 1.5–6.5)
Neutrophils Relative %: 82 %
Platelet Count: 203 10*3/uL (ref 145–400)
RBC: 4.43 MIL/uL (ref 3.70–5.45)
RDW: 14 % (ref 11.2–16.1)
WBC Count: 7.1 10*3/uL (ref 4.0–10.3)

## 2017-02-13 LAB — CMP (CANCER CENTER ONLY)
ALBUMIN: 4.2 g/dL (ref 3.5–5.0)
ALK PHOS: 103 U/L (ref 40–150)
ALT: 22 U/L (ref 0–55)
ANION GAP: 14 — AB (ref 3–11)
AST: 25 U/L (ref 5–34)
BILIRUBIN TOTAL: 0.7 mg/dL (ref 0.2–1.2)
BUN: 8 mg/dL (ref 7–26)
CALCIUM: 9.8 mg/dL (ref 8.4–10.4)
CO2: 24 mmol/L (ref 22–29)
CREATININE: 0.67 mg/dL — AB (ref 0.70–1.30)
Chloride: 104 mmol/L (ref 98–109)
GFR, Est AFR Am: >60 mL/min (ref >60)
GFR, Estimated: >60 mL/min (ref >60)
GLUCOSE: 100 mg/dL (ref 70–140)
Potassium: 3.5 mmol/L (ref 3.3–4.7)
Sodium: 142 mmol/L (ref 136–145)
Total Protein: 7.8 g/dL (ref 6.4–8.3)

## 2017-02-13 NOTE — Progress Notes (Signed)
Patient Care Team: McLean-Scocuzza, Nino Glow, MD as PCP - General (Internal Medicine) Perrin Maltese, MD (Internal Medicine) Delice Bison Charlestine Massed, NP as Nurse Practitioner (Hematology and Oncology) Nicholas Lose, MD as Consulting Physician (Hematology and Oncology) Stark Klein, MD as Consulting Physician (General Surgery) Kyung Rudd, MD as Consulting Physician (Radiation Oncology)  DIAGNOSIS:  Encounter Diagnosis  Name Primary?  . Malignant neoplasm of lower-outer quadrant of left breast of female, estrogen receptor positive ([place])     SUMMARY OF ONCOLOGIC HISTORY:   Malignant neoplasm of lower-outer quadrant of left breast of female, estrogen receptor positive ([place])   04/12/2016 Initial Diagnosis    Left breast biopsy/ lumpectomy: IDC grade 1, 0.8 cm with DCIS grade 1, margins negative, ER 100%, PR 100%, HER-2 negative, Ki-67 15%, T1b NX stage I a      05/21/2016 PET scan    Mild focal hypermetabolic in the left breast, no findings of metastatic disease. mild hypermetabolism is along the right costosternal junction favoring degenerative changes posttraumatic changes involving the right inferior scapular right sacrum bilateral pelvis and right lateral third and fourth ribs      05/29/2016 Surgery    1/4 sentinel lymph node positive with extracapsular extension       06/01/2016 Miscellaneous    Mammaprint low-risk: Average 10 year risk of recurrence 10%; Mammaprint index +0.389      07/24/2016 - 09/07/2016 Radiation Therapy    Adjuvant radiation therapy      09/2016 -  Anti-estrogen oral therapy    Anastrozole, changed to Letrozole after 1-2 months (patient unable to tolerate Anastrozole)       CHIEF COMPLIANT: Follow-up on letrozole therapy  INTERVAL HISTORY: Ashley Cross is a 31-year-old with above-mentioned history of left breast cancer who underwent lumpectomy followed by adjuvant radiation and is currently on letrozole.  She is tolerating letrozole extremely  well.  She has had multiple emotional problems on these seem to be settled down much better.  Her daughter is accompanying her today and reports that she gets really mad and angry if she anyone wakes her up at nighttime.  She has 3 dogs would sometimes wake her up and she is very unhappy about that.  However she loves her dogs.  REVIEW OF SYSTEMS:   Constitutional: Denies fevers, chills or abnormal weight loss Eyes: Denies blurriness of vision Ears, nose, mouth, throat, and face: Denies mucositis or sore throat Respiratory: Denies cough, dyspnea or wheezes Cardiovascular: Denies palpitation, chest discomfort Gastrointestinal:  Denies nausea, heartburn or change in bowel habits Skin: Denies abnormal skin rashes Lymphatics: Denies new lymphadenopathy or easy bruising Neurological:Denies numbness, tingling or new weaknesses Behavioral/Psych: Mood is stable, no new changes  Extremities: No lower extremity edema Breast:  denies any pain or lumps or nodules in either breasts All other systems were reviewed with the patient and are negative.  I have reviewed the past medical history, past surgical history, social history and family history with the patient and they are unchanged from previous note.  ALLERGIES:  is allergic to sulfa antibiotics and sulfonamide derivatives.  MEDICATIONS:  Current Outpatient Medications  Medication Sig Dispense Refill  . amitriptyline (ELAVIL) 50 MG tablet Take 1 tablet (50 mg total) by mouth at bedtime. 30 tablet 5  . diazepam (VALIUM) 2 MG tablet Take 2 mg by mouth at bedtime as needed for muscle spasms or anxiety.    . gabapentin (NEURONTIN) 600 MG tablet Take 1,200 mg by mouth 3 (three) times daily.    Marland Kitchen  ibuprofen (ADVIL,MOTRIN) 200 MG tablet Take 800 mg by mouth every 8 (eight) hours as needed (for pain).     Marland Kitchen letrozole (FEMARA) 2.5 MG tablet Take 1 tablet (2.5 mg total) daily by mouth. 30 tablet 3  . oxymorphone (OPANA) 10 MG tablet Take 10 mg by mouth 2  (two) times daily.     . Vitamin D, Ergocalciferol, (DRISDOL) 50000 units CAPS capsule Take 1 capsule (50,000 Units total) by mouth every Monday. 12 capsule 3   No current facility-administered medications for this visit.     PHYSICAL EXAMINATION: ECOG PERFORMANCE STATUS: 1 - Symptomatic but completely ambulatory  Vitals:   02/13/17 1001  BP: (!) 146/82  Pulse: (!) 121  Resp: 18  Temp: 98.3 F (36.8 C)  SpO2: 99%   Filed Weights   02/13/17 1001  Weight: 130 lb 14.4 oz (59.4 kg)    GENERAL:alert, no distress and comfortable SKIN: skin color, texture, turgor are normal, no rashes or significant lesions EYES: normal, Conjunctiva are pink and non-injected, sclera clear OROPHARYNX:no exudate, no erythema and lips, buccal mucosa, and tongue normal  NECK: supple, thyroid normal size, non-tender, without nodularity LYMPH:  no palpable lymphadenopathy in the cervical, axillary or inguinal LUNGS: clear to auscultation and percussion with normal breathing effort HEART: regular rate & rhythm and no murmurs and no lower extremity edema ABDOMEN:abdomen soft, non-tender and normal bowel sounds MUSCULOSKELETAL:no cyanosis of digits and no clubbing  NEURO: alert & oriented x 3 with fluent speech, no focal motor/sensory deficits EXTREMITIES: No lower extremity edema  LABORATORY DATA:  I have reviewed the data as listed CMP Latest Ref Rng & Units 01/25/2017 05/16/2016 02/07/2016  Glucose 70 - 99 mg/dL 87 103(H) 102(H)  BUN 6 - 23 mg/dL 10 8 6   Creatinine 0.40 - 1.20 mg/dL 0.50 0.52 0.54  Sodium 135 - 145 mEq/L 135 138 138  Potassium 3.5 - 5.1 mEq/L 3.9 4.4 4.2  Chloride 96 - 112 mEq/L 99 101 105  CO2 19 - 32 mEq/L 29 27 26   Calcium 8.4 - 10.5 mg/dL 9.2 9.7 9.0  Total Protein 6.0 - 8.3 g/dL 7.4 - -  Total Bilirubin 0.2 - 1.2 mg/dL 0.4 - -  Alkaline Phos 39 - 117 U/L 82 - -  AST 0 - 37 U/L 18 - -  ALT 0 - 35 U/L 21 - -    Lab Results  Component Value Date   WBC 8.8 01/25/2017   HGB  15.2 (H) 01/25/2017   HCT 44.6 01/25/2017   MCV 99.2 01/25/2017   PLT 241.0 01/25/2017   NEUTROABS 7.6 01/25/2017    ASSESSMENT & PLAN:  Malignant neoplasm of lower-outer quadrant of left breast of female, estrogen receptor positive ([place]) 04/12/2016 Left breast biopsy/ lumpectomy: IDC grade 10.8 cm with DCIS grade 1, margins negative, ER 100%, PR 100%, HER-2 negative, Ki-67 15%  PET/CT scan: Negative for metastatic disease Sentinel lymph node study: 1/4 sentinel nodes positive with extracapsular extension  Mammaprint low-risk: 10 year risk of recurrence 10% Adjuvant radiation therapy 07/24/2016- 09/07/2016  Treatment plan: Adjuvant antiestrogen therapy with anastrozole 1 mg daily 7 years switched to letrozole December 2018  Letrozole toxicities: Very occasional hot flashes.  Denies any major trouble from this.  Emotionally she appears to be doing very well.  Return to clinic in 6 months for follow-up and after that she can be seen once a year.   I spent 25 minutes talking to the patient of which more than half was spent in  counseling and coordination of care.  No orders of the defined types were placed in this encounter.  The patient has a good understanding of the overall plan. she agrees with it. she will call with any problems that may develop before the next visit here.   Harriette Ohara, MD 02/13/17

## 2017-03-12 ENCOUNTER — Other Ambulatory Visit: Payer: Self-pay | Admitting: Oncology

## 2017-03-12 DIAGNOSIS — Z170 Estrogen receptor positive status [ER+]: Principal | ICD-10-CM

## 2017-03-12 DIAGNOSIS — C50512 Malignant neoplasm of lower-outer quadrant of left female breast: Secondary | ICD-10-CM

## 2017-04-18 DIAGNOSIS — M545 Low back pain: Secondary | ICD-10-CM | POA: Diagnosis not present

## 2017-04-26 ENCOUNTER — Ambulatory Visit: Payer: PPO | Admitting: Internal Medicine

## 2017-05-01 ENCOUNTER — Ambulatory Visit
Admission: RE | Admit: 2017-05-01 | Discharge: 2017-05-01 | Disposition: A | Discharge status: Home / Self Care | Payer: PPO | Source: Ambulatory Visit | Attending: Hematology and Oncology | Admitting: Hematology and Oncology

## 2017-05-01 ENCOUNTER — Ambulatory Visit
Admission: RE | Admit: 2017-05-01 | Discharge: 2017-05-01 | Disposition: A | Discharge status: Home / Self Care | Payer: PPO | Source: Ambulatory Visit | Attending: Adult Health | Admitting: Adult Health

## 2017-05-01 DIAGNOSIS — C50512 Malignant neoplasm of lower-outer quadrant of left female breast: Secondary | ICD-10-CM

## 2017-05-01 DIAGNOSIS — R922 Inconclusive mammogram: Secondary | ICD-10-CM | POA: Diagnosis not present

## 2017-05-01 DIAGNOSIS — M8580 Other specified disorders of bone density and structure, unspecified site: Secondary | ICD-10-CM | POA: Insufficient documentation

## 2017-05-01 DIAGNOSIS — E2839 Other primary ovarian failure: Secondary | ICD-10-CM

## 2017-05-01 DIAGNOSIS — Z780 Asymptomatic menopausal state: Secondary | ICD-10-CM | POA: Diagnosis not present

## 2017-05-01 DIAGNOSIS — Z170 Estrogen receptor positive status [ER+]: Principal | ICD-10-CM

## 2017-05-01 DIAGNOSIS — M85851 Other specified disorders of bone density and structure, right thigh: Secondary | ICD-10-CM | POA: Diagnosis not present

## 2017-05-01 HISTORY — DX: Personal history of irradiation: Z92.3

## 2017-05-01 LAB — HM MAMMOGRAPHY

## 2017-05-02 DIAGNOSIS — E785 Hyperlipidemia, unspecified: Secondary | ICD-10-CM | POA: Insufficient documentation

## 2017-05-03 ENCOUNTER — Encounter: Payer: Self-pay | Admitting: Internal Medicine

## 2017-05-03 ENCOUNTER — Telehealth: Payer: Self-pay

## 2017-05-03 ENCOUNTER — Ambulatory Visit (INDEPENDENT_AMBULATORY_CARE_PROVIDER_SITE_OTHER): Payer: PPO | Admitting: Internal Medicine

## 2017-05-03 VITALS — BP 140/76 | HR 96 | Temp 98.0°F | Resp 16 | Ht 66.0 in | Wt 125.2 lb

## 2017-05-03 DIAGNOSIS — I10 Essential (primary) hypertension: Secondary | ICD-10-CM

## 2017-05-03 DIAGNOSIS — Z720 Tobacco use: Secondary | ICD-10-CM | POA: Diagnosis not present

## 2017-05-03 DIAGNOSIS — G894 Chronic pain syndrome: Secondary | ICD-10-CM | POA: Diagnosis not present

## 2017-05-03 DIAGNOSIS — R5383 Other fatigue: Secondary | ICD-10-CM | POA: Diagnosis not present

## 2017-05-03 DIAGNOSIS — R490 Dysphonia: Secondary | ICD-10-CM | POA: Diagnosis not present

## 2017-05-03 DIAGNOSIS — M8580 Other specified disorders of bone density and structure, unspecified site: Secondary | ICD-10-CM

## 2017-05-03 DIAGNOSIS — E559 Vitamin D deficiency, unspecified: Secondary | ICD-10-CM | POA: Diagnosis not present

## 2017-05-03 DIAGNOSIS — E785 Hyperlipidemia, unspecified: Secondary | ICD-10-CM

## 2017-05-03 DIAGNOSIS — F419 Anxiety disorder, unspecified: Secondary | ICD-10-CM | POA: Diagnosis not present

## 2017-05-03 DIAGNOSIS — R918 Other nonspecific abnormal finding of lung field: Secondary | ICD-10-CM

## 2017-05-03 DIAGNOSIS — F329 Major depressive disorder, single episode, unspecified: Secondary | ICD-10-CM

## 2017-05-03 DIAGNOSIS — Z853 Personal history of malignant neoplasm of breast: Secondary | ICD-10-CM

## 2017-05-03 MED ORDER — SERTRALINE HCL 50 MG PO TABS: 50.0000 mg | ORAL_TABLET | Freq: Every day | ORAL | 5 refills | Status: DC

## 2017-05-03 MED ORDER — AMLODIPINE BESYLATE 2.5 MG PO TABS: 2.5000 mg | ORAL_TABLET | Freq: Every day | ORAL | 3 refills | Status: DC

## 2017-05-03 NOTE — Telephone Encounter (Signed)
LVM for patient to call back for BD results/recommendations per NP.

## 2017-05-03 NOTE — Telephone Encounter (Signed)
-----   Message from Gardenia Phlegm, NP sent at 05/03/2017 10:14 AM EDT ----- Please let paitent know that bone density is consistent with osteopenia.  We recommend calcium, vitamin d and weight bearing exercises.  Will need to repeat bone density in 2 years.   ----- Message ----- From: Interface, Rad Results In Sent: 05/01/2017  10:57 AM To: Gardenia Phlegm, NP

## 2017-05-03 NOTE — Patient Instructions (Addendum)
Finish out 5 weeks of weekly vitamin D3 then take vitamin D3 daily 1000-2000 IU daily with calcium 600 mg 2x per day  Try Amitriptyline 1/2 pill x 1 week then stop  Start Zoloft 50 mg in the am. Do not take Celexa  Start Norvasc 2.5 mg in am  Think about pneumona 23 vaccine  Call back in 2 weeks or mychart me if voice is still hoarse  Talk to pain clinic about tapering down on some of medications I.e gabapentin  Please f/u in 3-4 months sooner if needed     Pneumococcal Polysaccharide Vaccine: What You Need to Know 1. Why get vaccinated? Vaccination can protect older adults (and some children and younger adults) from pneumococcal disease. Pneumococcal disease is caused by bacteria that can spread from person to person through close contact. It can cause ear infections, and it can also lead to more serious infections of the:  Lungs (pneumonia),  Blood (bacteremia), and  Covering of the brain and spinal cord (meningitis). Meningitis can cause deafness and brain damage, and it can be fatal.  Anyone can get pneumococcal disease, but children under 12 years of age, people with certain medical conditions, adults over 55 years of age, and cigarette smokers are at the highest risk. About 18,000 older adults die each year from pneumococcal disease in the [place]. Treatment of pneumococcal infections with penicillin and other drugs used to be more effective. But some strains of the disease have become resistant to these drugs. This makes prevention of the disease, through vaccination, even more important. 2. Pneumococcal polysaccharide vaccine (PPSV23) Pneumococcal polysaccharide vaccine (PPSV23) protects against 23 types of pneumococcal bacteria. It will not prevent all pneumococcal disease. PPSV23 is recommended for:  All adults 72 years of age and older,  Anyone 2 through 61 years of age with certain long-term health problems,  Anyone 2 through 61 years of age with a weakened immune  system,  Adults 18 through 61 years of age who smoke cigarettes or have asthma.  Most people need only one dose of PPSV. A second dose is recommended for certain high-risk groups. People 25 and older should get a dose even if they have gotten one or more doses of the vaccine before they turned 65. Your healthcare provider can give you more information about these recommendations. Most healthy adults develop protection within 2 to 3 weeks of getting the shot. 3. Some people should not get this vaccine  Anyone who has had a life-threatening allergic reaction to PPSV should not get another dose.  Anyone who has a severe allergy to any component of PPSV should not receive it. Tell your provider if you have any severe allergies.  Anyone who is moderately or severely ill when the shot is scheduled may be asked to wait until they recover before getting the vaccine. Someone with a mild illness can usually be vaccinated.  Children less than 64 years of age should not receive this vaccine.  There is no evidence that PPSV is harmful to either a pregnant woman or to her fetus. However, as a precaution, women who need the vaccine should be vaccinated before becoming pregnant, if possible. 4. Risks of a vaccine reaction With any medicine, including vaccines, there is a chance of side effects. These are usually mild and go away on their own, but serious reactions are also possible. About half of people who get PPSV have mild side effects, such as redness or pain where the shot is given, which go  away within about two days. Less than 1 out of 100 people develop a fever, muscle aches, or more severe local reactions. Problems that could happen after any vaccine:  People sometimes faint after a medical procedure, including vaccination. Sitting or lying down for about 15 minutes can help prevent fainting, and injuries caused by a fall. Tell your doctor if you feel dizzy, or have vision changes or ringing in the  ears.  Some people get severe pain in the shoulder and have difficulty moving the arm where a shot was given. This happens very rarely.  Any medication can cause a severe allergic reaction. Such reactions from a vaccine are very rare, estimated at about 1 in a million doses, and would happen within a few minutes to a few hours after the vaccination. As with any medicine, there is a very remote chance of a vaccine causing a serious injury or death. The safety of vaccines is always being monitored. For more information, visit: http://www.aguilar.org/ 5. What if there is a serious reaction? What should I look for? Look for anything that concerns you, such as signs of a severe allergic reaction, very high fever, or unusual behavior. Signs of a severe allergic reaction can include hives, swelling of the face and throat, difficulty breathing, a fast heartbeat, dizziness, and weakness. These would usually start a few minutes to a few hours after the vaccination. What should I do? If you think it is a severe allergic reaction or other emergency that can't wait, call 9-1-1 or get to the nearest hospital. Otherwise, call your doctor. Afterward, the reaction should be reported to the Vaccine Adverse Event Reporting System (VAERS). Your doctor might file this report, or you can do it yourself through the VAERS web site at www.vaers.SamedayNews.es, or by calling 646-267-6148. VAERS does not give medical advice. 6. How can I learn more?  Ask your doctor. He or she can give you the vaccine package insert or suggest other sources of information.  Call your local or state health department.  Contact the Centers for Disease Control and Prevention (CDC): ? Call 413-249-2406 (1-800-CDC-INFO) or ? Visit CDC's website at http://hunter.com/ CDC Pneumococcal Polysaccharide Vaccine VIS (05/29/13) This information is not intended to replace advice given to you by your health care provider. Make sure you discuss any  questions you have with your health care provider. Document Released: 11/19/2005 Document Revised: 10/13/2015 Document Reviewed: 10/13/2015 Elsevier Interactive Patient Education  2017 [employer].

## 2017-05-03 NOTE — Telephone Encounter (Signed)
Patient LVM with nurse that she saw PCP today and has already discussed BD results and has got calcium, etc. And knows what she needs to do moving forward.  She thanked center for care and contacting her to give results.

## 2017-05-03 NOTE — Progress Notes (Signed)
Pre-visit discussion using our clinic review tool. No additional management support is needed unless otherwise documented below in the visit note.  

## 2017-05-06 ENCOUNTER — Encounter: Payer: Self-pay | Admitting: Internal Medicine

## 2017-05-06 DIAGNOSIS — R490 Dysphonia: Secondary | ICD-10-CM | POA: Insufficient documentation

## 2017-05-06 DIAGNOSIS — F419 Anxiety disorder, unspecified: Secondary | ICD-10-CM

## 2017-05-06 DIAGNOSIS — I10 Essential (primary) hypertension: Secondary | ICD-10-CM | POA: Insufficient documentation

## 2017-05-06 DIAGNOSIS — F329 Major depressive disorder, single episode, unspecified: Secondary | ICD-10-CM | POA: Insufficient documentation

## 2017-05-06 DIAGNOSIS — F32A Depression, unspecified: Secondary | ICD-10-CM | POA: Insufficient documentation

## 2017-05-06 DIAGNOSIS — R5383 Other fatigue: Secondary | ICD-10-CM | POA: Insufficient documentation

## 2017-05-06 NOTE — Progress Notes (Addendum)
Chief Complaint  Patient presents with  . Follow-up    3 month follow up.   F/u  1. Pt c/o fatigue. She is on a lot of sedating meds and wants to try to get off amitriptyline 50 mg qhs. Also taking Neurontin 1200 mg tid and then another 600 mg per pain clinic, opana and Valium 2 mg prn, disc'ed medications can make her tired  2. Pt c/o hoarseness new again last time she reports after last OV was hoarse and then was sick with URI this is 2nd time hoarse in 3 months. Still smoking. Denies h/o heart burn 3. Chronic back pain epidural shots helping started shots in late dec. 2018 4. +ostepenia reviewed DEXA 05/01/17  5. BP elevated today and previous visit likely has HTN not on medications. HLD TC 223 not eating bad foods and no sweets given cholesterol info  6. Depression improved per pt wants to get off amitriptyline 50 mg qhs due to #1. She is interested in another option for anxiety/depression has been on Celexa in the past disc Zoloft today pt agreeable to try  7. Tobacco abuse still smoking and not currently interested in quitting.    Review of Systems  Constitutional: Positive for malaise/fatigue. Negative for weight loss.  HENT: Negative for hearing loss.   Eyes: Negative for blurred vision.  Respiratory: Negative for shortness of breath.   Cardiovascular: Negative for chest pain.  Gastrointestinal: Negative for abdominal pain.  Musculoskeletal: Positive for back pain.  Skin: Negative for rash.  Neurological: Negative for headaches.  Psychiatric/Behavioral:       Depression/anxiety improved    Past Medical History:  Diagnosis Date  . Anxiety   . Breast cancer ([place])    Left breast(nipple mass) 2018  . Chicken pox   . Chronic back pain   . Colon polyps   . Cyst of left nipple   . Depression   . Hypertension   . Insomnia   . Personal history of radiation therapy    Past Surgical History:  Procedure Laterality Date  . BACK SURGERY     x 3 (2010 x 2; 2013 x 1)   . BREAST  BIOPSY Left    core- neg  . BREAST LUMPECTOMY Left   . BREAST SURGERY Left    breast biopsy  . CERVICAL CONE BIOPSY    . CERVICAL FUSION     times 2  . COLONOSCOPY WITH PROPOFOL N/A 04/18/2015   Procedure: COLONOSCOPY WITH PROPOFOL;  Surgeon: [name], MD;  Location: [facility] ENDOSCOPY;  Service: Endoscopy;  Laterality: N/A;  . ELBOW SURGERY     2014 b/l elbows per pt ulnar nerve   . EPIDURAL BLOCK INJECTION     Dr. Maryjean Ka   . MASS EXCISION Left 04/12/2016   Procedure: EXCISION OF LEFT NIPPLE MASS;  Surgeon: Stark Klein, MD;  Location: [facility];  Service: General;  Laterality: Left;  . NECK SURGERY    . POSTERIOR CERVICAL FUSION/FORAMINOTOMY  03/07/2011   Procedure: POSTERIOR CERVICAL FUSION/FORAMINOTOMY LEVEL 4;  Surgeon: Eustace Moore, MD;  Location: [facility] NEURO ORS;  Service: Neurosurgery;  Laterality: N/A;  cervical three to thoracic one posterior cervical fusion   . SENTINEL NODE BIOPSY Left 05/29/2016   Procedure: LEFT SENTINEL LYMPH NODE BIOPSY;  Surgeon: Stark Klein, MD;  Location: [facility];  Service: General;  Laterality: Left;  . TONSILLECTOMY     Family History  Problem Relation Age of Onset  . Breast cancer Mother 40  .  Hypertension Mother   . Cancer Father        prostate  . Hypertension Father   . Depression Daughter   . Hypertension Maternal Grandmother   . Breast cancer Cousin    Social History   Socioeconomic History  . Marital status: Single    Spouse name: Not on file  . Number of children: Not on file  . Years of education: Not on file  . Highest education level: Not on file  Occupational History  . Not on file  Social Needs  . Financial resource strain: Not on file  . Food insecurity:    Worry: Not on file    Inability: Not on file  . Transportation needs:    Medical: Not on file    Non-medical: Not on file  Tobacco Use  . Smoking status: Current Every Day Smoker    Packs/day: 1.00    Years: 30.00    Pack years: 30.00     Types: Cigarettes  . Smokeless tobacco: Never Used  Substance and Sexual Activity  . Alcohol use: Yes    Comment: social  . Drug use: No    Comment: on opana since jan 2018  . Sexual activity: Not on file  Lifestyle  . Physical activity:    Days per week: Not on file    Minutes per session: Not on file  . Stress: Not on file  Relationships  . Social connections:    Talks on phone: Not on file    Gets together: Not on file    Attends religious service: Not on file    Active member of club or organization: Not on file    Attends meetings of clubs or organizations: Not on file    Relationship status: Not on file  . Intimate partner violence:    Fear of current or ex partner: Not on file    Emotionally abused: Not on file    Physically abused: Not on file    Forced sexual activity: Not on file  Other Topics Concern  . Not on file  Social History Narrative   As of 01/25/17 not sexually active of in relationship           Current Meds  Medication Sig  . diazepam (VALIUM) 2 MG tablet Take 2 mg by mouth at bedtime as needed for muscle spasms or anxiety.  . gabapentin (NEURONTIN) 600 MG tablet Take 1,200 mg by mouth 3 (three) times daily.  Marland Kitchen ibuprofen (ADVIL,MOTRIN) 200 MG tablet Take 800 mg by mouth every 8 (eight) hours as needed (for pain).   Marland Kitchen letrozole (FEMARA) 2.5 MG tablet TAKE ONE TABLET EVERY DAY  . oxymorphone (OPANA) 10 MG tablet Take 10 mg by mouth 2 (two) times daily.   . Vitamin D, Ergocalciferol, (DRISDOL) 50000 units CAPS capsule Take 1 capsule (50,000 Units total) by mouth every Monday.  . [DISCONTINUED] amitriptyline (ELAVIL) 50 MG tablet Take 1 tablet (50 mg total) by mouth at bedtime.   Allergies  Allergen Reactions  . Sulfa Antibiotics Itching and Rash  . Sulfonamide Derivatives Itching and Rash   Recent Results (from the past 2160 hour(s))  CMP ([facility] only)     Status: Abnormal   Collection Time: 02/13/17  9:36 AM  Result Value Ref Range    Sodium 142 136 - 145 mmol/L   Potassium 3.5 3.3 - 4.7 mmol/L   Chloride 104 98 - 109 mmol/L   CO2 24 22 - 29 mmol/L  Glucose, Bld 100 70 - 140 mg/dL   BUN 8 7 - 26 mg/dL   Creatinine 0.67 (L) 0.70 - 1.30 mg/dL   Calcium 9.8 8.4 - 10.4 mg/dL   Total Protein 7.8 6.4 - 8.3 g/dL   Albumin 4.2 3.5 - 5.0 g/dL   AST 25 5 - 34 U/L   ALT 22 0 - 55 U/L   Alkaline Phosphatase 103 40 - 150 U/L   Total Bilirubin 0.7 0.2 - 1.2 mg/dL   GFR, Est Non Af Am >60 >60 mL/min   GFR, Est AFR Am >60 >60 mL/min    Comment: (NOTE) The eGFR has been calculated using the CKD EPI equation. This calculation has not been validated in all clinical situations. eGFR's persistently <60 mL/min signify possible Chronic Kidney Disease.    Anion gap 14 (H) 3 - 11    Comment: Performed at [facility], [place] [address], [place], [place] [address]  CBC with Differential (Cancer Center Only)     Status: Abnormal   Collection Time: 02/13/17  9:36 AM  Result Value Ref Range   WBC Count 7.1 4.0 - 10.3 K/uL   RBC 4.43 3.70 - 5.45 MIL/uL   Hemoglobin 14.9 11.6 - 15.9 g/dL   HCT 44.1 34.8 - 46.6 %   MCV 99.6 79.5 - 101.0 fL   MCH 33.7 25.1 - 34.0 pg   MCHC 33.9 31.5 - 36.0 g/dL   RDW 14.0 11.2 - 16.1 %   Platelet Count 203 145 - 400 K/uL   Neutrophils Relative % 82 %   Neutro Abs 5.8 1.5 - 6.5 K/uL   Abs Granulocyte 5.8 1.5 - 6.5 K/uL   Lymphocytes Relative 11 %   Lymphs Abs 0.7 (L) 0.9 - 3.3 K/uL   Monocytes Relative 6 %   Monocytes Absolute 0.4 0.1 - 0.9 K/uL   Eosinophils Relative 0 %   Eosinophils Absolute 0.0 0.0 - 0.5 K/uL   Basophils Relative 1 %   Basophils Absolute 0.1 0.0 - 0.1 K/uL    Comment: Performed at [facility], [place] [address], [place], [place] [address]   Objective  Body mass index is 20.22 kg/m. Wt Readings from Last 3 Encounters:  05/03/17 125 lb 4 oz (56.8 kg)  02/13/17 130 lb 14.4 oz (59.4 kg)  01/25/17 133 lb 8 oz (60.6 kg)   Temp  Readings from Last 3 Encounters:  05/03/17 98 F (36.7 C) (Oral)  02/13/17 98.3 F (36.8 C) (Oral)  01/25/17 98.7 F (37.1 C) (Oral)   BP Readings from Last 3 Encounters:  05/03/17 140/76  02/13/17 (!) 146/82  01/25/17 (!) 144/76   Pulse Readings from Last 3 Encounters:  05/03/17 96  02/13/17 (!) 121  01/25/17 (!) 103    Physical Exam  Constitutional: She is oriented to person, place, and time and well-developed, well-nourished, and in no distress.  HENT:  Head: Normocephalic and atraumatic.  Mouth/Throat: Oropharynx is clear and moist and mucous membranes are normal.  Eyes: Pupils are equal, round, and reactive to light. Conjunctivae are normal.  Cardiovascular: Normal rate, regular rhythm and normal heart sounds.  Pulmonary/Chest: Effort normal and breath sounds normal. She has no wheezes.  Neurological: She is alert and oriented to person, place, and time. Gait normal. Gait normal.  Skin: Skin is warm, dry and intact.  Psychiatric: Mood, memory, affect and judgment normal.  Nursing note and vitals reviewed.   Assessment   1. Fatigue could be 2/2  a lot of sedating medications opana, high dose gabapentin 1200 tid then 600 mg qhs , TCA 2. Hoarseness could be acute 2/2 smoking  3. Chronic back pain  4. Osteopenia  5. HTN/HLD 6. H/o anxiety/depression/insomnia  7. Tobacco abuse  8. H/o breast cancer  9. HM  Plan  1. Reviewed abs 02/2017 CMET nl, cbc nl, TSH, T4 nl 01/25/17, UA nl 01/2017  Disc with pt ask pain clinic to titrate down sedating meds  Taper off elavil 50, 25 mg x 1 week then off  Reviewed CT ab/pelvis with radiologist and does appear 1.8 cm left adrenal nodule is benign per CT and PET no need for f/u  Advised pt to f/u with H/O due to h/o breast cancer messaged H/o when next f/u due  2. Call back in 2 weeks if still hoarse will need to f/u ENT  3. Cont epidural back injections  4. rec vit D 50K finish out 5 more weeks then OTC D3 1000-2000 IU qd  5. Add  norvasc 2.5 mg qd recheck at f/u  Given cholesterol handout  5. Taper off Elavil, add Zoloft 50 mg qd  7. rec cessation  8. F/u H/o messaged to f/u when due for f/u  9. Had flu shot  Tdap UTD  Given info about pna 23 vaccine should do b/c smoker  rec shingrix vaccine as well   Pap need to get records from [place]  02/10/15 neg pap  mammo 05/01/17 negative h/o breast cancer dx mammo due in 1 year  dexa 05/01/17 osteopenia  Colonoscopy had 04/18/15 see report  Hep C neg 01/20/16    Reviewed labs [facility] scanned into chart with pap under labs  Prev CT chest 01/2016 +ground glass will repeat CT chest   Had epidural spinal inj 07/04/17 L4/5 Dr. Clydell Hakim   Provider: Dr. Olivia Mackie McLean-Scocuzza-Internal Medicine

## 2017-05-10 ENCOUNTER — Ambulatory Visit: Payer: PPO

## 2017-05-14 ENCOUNTER — Ambulatory Visit
Admission: RE | Admit: 2017-05-14 | Discharge: 2017-05-14 | Disposition: A | Discharge status: Home / Self Care | Payer: PPO | Source: Ambulatory Visit | Attending: Internal Medicine | Admitting: Internal Medicine

## 2017-05-14 DIAGNOSIS — Z720 Tobacco use: Secondary | ICD-10-CM

## 2017-05-14 DIAGNOSIS — Z923 Personal history of irradiation: Secondary | ICD-10-CM | POA: Diagnosis not present

## 2017-05-14 DIAGNOSIS — R0602 Shortness of breath: Secondary | ICD-10-CM | POA: Diagnosis not present

## 2017-05-14 DIAGNOSIS — I700 Atherosclerosis of aorta: Secondary | ICD-10-CM | POA: Insufficient documentation

## 2017-05-14 DIAGNOSIS — R918 Other nonspecific abnormal finding of lung field: Secondary | ICD-10-CM | POA: Insufficient documentation

## 2017-05-28 ENCOUNTER — Encounter: Payer: Self-pay | Admitting: Internal Medicine

## 2017-06-07 DIAGNOSIS — I890 Lymphedema, not elsewhere classified: Secondary | ICD-10-CM | POA: Diagnosis not present

## 2017-06-07 DIAGNOSIS — C50012 Malignant neoplasm of nipple and areola, left female breast: Secondary | ICD-10-CM | POA: Diagnosis not present

## 2017-06-07 DIAGNOSIS — R197 Diarrhea, unspecified: Secondary | ICD-10-CM | POA: Diagnosis not present

## 2017-06-24 ENCOUNTER — Ambulatory Visit: Payer: PPO | Attending: General Surgery | Admitting: Physical Therapy

## 2017-06-24 DIAGNOSIS — M6281 Muscle weakness (generalized): Secondary | ICD-10-CM | POA: Diagnosis not present

## 2017-06-24 DIAGNOSIS — M25512 Pain in left shoulder: Secondary | ICD-10-CM | POA: Diagnosis not present

## 2017-06-24 DIAGNOSIS — R600 Localized edema: Secondary | ICD-10-CM
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
## 2017-06-26 ENCOUNTER — Telehealth: Payer: Self-pay | Admitting: Internal Medicine

## 2017-06-26 NOTE — Telephone Encounter (Signed)
Copied from [place] [id number]. Topic: Quick Communication - See Telephone Encounter >> Jun 26, 2017  3:35 PM Ivar Drape wrote: CRM for notification. See Telephone encounter for: 06/26/17. Patient would like a refill on her sertraline (ZOLOFT) 50 MG tablet medication.  She also said the doctor told her if she wanted the prescription increased to 100MG  to let her know.  So she is requesting that it be increased to 100MG .  She would like to have it sent to her preferred pharmacy [facility].

## 2017-06-27 ENCOUNTER — Other Ambulatory Visit: Payer: Self-pay | Admitting: Internal Medicine

## 2017-06-27 DIAGNOSIS — F419 Anxiety disorder, unspecified: Principal | ICD-10-CM

## 2017-06-27 DIAGNOSIS — F329 Major depressive disorder, single episode, unspecified: Secondary | ICD-10-CM

## 2017-06-27 MED ORDER — SERTRALINE HCL 100 MG PO TABS: 100.0000 mg | ORAL_TABLET | Freq: Every day | ORAL | 5 refills | Status: DC

## 2017-07-04 ENCOUNTER — Encounter: Payer: PPO | Admitting: Physical Therapy

## 2017-07-04 DIAGNOSIS — M545 Low back pain: Secondary | ICD-10-CM | POA: Diagnosis not present

## 2017-07-05 ENCOUNTER — Encounter: Payer: PPO | Admitting: Physical Therapy

## 2017-07-08 ENCOUNTER — Encounter: Payer: PPO | Admitting: Physical Therapy

## 2017-07-11 ENCOUNTER — Other Ambulatory Visit: Payer: Self-pay | Admitting: *Deleted

## 2017-07-11 DIAGNOSIS — C50512 Malignant neoplasm of lower-outer quadrant of left female breast: Secondary | ICD-10-CM

## 2017-07-11 DIAGNOSIS — Z170 Estrogen receptor positive status [ER+]: Principal | ICD-10-CM

## 2017-07-11 MED ORDER — LETROZOLE 2.5 MG PO TABS: 2.5000 mg | ORAL_TABLET | Freq: Every day | ORAL | 3 refills | Status: DC

## 2017-07-15 ENCOUNTER — Ambulatory Visit: Payer: PPO | Admitting: Physical Therapy

## 2017-07-17 ENCOUNTER — Ambulatory Visit: Payer: PPO | Admitting: Physical Therapy

## 2017-07-22 ENCOUNTER — Encounter: Payer: PPO | Admitting: Physical Therapy

## 2017-07-24 ENCOUNTER — Encounter: Payer: PPO | Admitting: Physical Therapy

## 2017-08-09 ENCOUNTER — Ambulatory Visit: Payer: PPO | Admitting: Internal Medicine

## 2017-08-16 ENCOUNTER — Inpatient Hospital Stay: Payer: PPO | Attending: Hematology and Oncology | Admitting: Hematology and Oncology

## 2017-08-16 ENCOUNTER — Ambulatory Visit: Payer: Self-pay

## 2017-08-16 DIAGNOSIS — Z79811 Long term (current) use of aromatase inhibitors: Secondary | ICD-10-CM | POA: Insufficient documentation

## 2017-08-16 DIAGNOSIS — Z170 Estrogen receptor positive status [ER+]: Secondary | ICD-10-CM | POA: Diagnosis not present

## 2017-08-16 DIAGNOSIS — Z79899 Other long term (current) drug therapy: Secondary | ICD-10-CM | POA: Insufficient documentation

## 2017-08-16 DIAGNOSIS — Z923 Personal history of irradiation: Secondary | ICD-10-CM

## 2017-08-16 DIAGNOSIS — C50512 Malignant neoplasm of lower-outer quadrant of left female breast: Secondary | ICD-10-CM | POA: Insufficient documentation

## 2017-08-16 MED ORDER — LETROZOLE 2.5 MG PO TABS: 2.5000 mg | ORAL_TABLET | Freq: Every day | ORAL | 3 refills | Status: DC

## 2017-08-16 NOTE — Progress Notes (Signed)
Patient Care Team: McLean-Scocuzza, Nino Glow, MD as PCP - General (Internal Medicine) Perrin Maltese, MD (Internal Medicine) Delice Bison Charlestine Massed, NP as Nurse Practitioner (Hematology and Oncology) Nicholas Lose, MD as Consulting Physician (Hematology and Oncology) Stark Klein, MD as Consulting Physician (General Surgery) Kyung Rudd, MD as Consulting Physician (Radiation Oncology)  DIAGNOSIS:  Encounter Diagnosis  Name Primary?  . Malignant neoplasm of lower-outer quadrant of left breast of female, estrogen receptor positive ([place])     SUMMARY OF ONCOLOGIC HISTORY:   Malignant neoplasm of lower-outer quadrant of left breast of female, estrogen receptor positive ([place])   04/12/2016 Initial Diagnosis    Left breast biopsy/ lumpectomy: IDC grade 1, 0.8 cm with DCIS grade 1, margins negative, ER 100%, PR 100%, HER-2 negative, Ki-67 15%, T1b NX stage I a      05/21/2016 PET scan    Mild focal hypermetabolic in the left breast, no findings of metastatic disease. mild hypermetabolism is along the right costosternal junction favoring degenerative changes posttraumatic changes involving the right inferior scapular right sacrum bilateral pelvis and right lateral third and fourth ribs      05/29/2016 Surgery    1/4 sentinel lymph node positive with extracapsular extension       06/01/2016 Miscellaneous    Mammaprint low-risk: Average 10 year risk of recurrence 10%; Mammaprint index +0.389      07/24/2016 - 09/07/2016 Radiation Therapy    Adjuvant radiation therapy      09/2016 -  Anti-estrogen oral therapy    Anastrozole, changed to Letrozole after 1-2 months (patient unable to tolerate Anastrozole)       CHIEF COMPLIANT: Follow-up on letrozole therapy  INTERVAL HISTORY: Ashley Cross is a 62 year old with above-mentioned history of left breast cancer and is currently on letrozole therapy and appears to be doing well.  Denies any lumps or nodules in the breast.  Emotionally  she appears to be a lot more stable.  She has had some trouble with her daughter which is causing her some stress.  Mammograms done in March were normal.  REVIEW OF SYSTEMS:   Constitutional: Denies fevers, chills or abnormal weight loss Eyes: Denies blurriness of vision Ears, nose, mouth, throat, and face: Denies mucositis or sore throat Respiratory: Denies cough, dyspnea or wheezes Cardiovascular: Denies palpitation, chest discomfort Gastrointestinal:  Denies nausea, heartburn or change in bowel habits Skin: Denies abnormal skin rashes Lymphatics: Denies new lymphadenopathy or easy bruising Neurological:Denies numbness, tingling or new weaknesses Behavioral/Psych: Mood is stable, no new changes  Extremities: No lower extremity edema Breast:  denies any pain or lumps or nodules in either breasts All other systems were reviewed with the patient and are negative.  I have reviewed the past medical history, past surgical history, social history and family history with the patient and they are unchanged from previous note.  ALLERGIES:  is allergic to sulfa antibiotics and sulfonamide derivatives.  MEDICATIONS:  Current Outpatient Medications  Medication Sig Dispense Refill  . amLODipine (NORVASC) 2.5 MG tablet Take 1 tablet (2.5 mg total) by mouth daily. In am 90 tablet 3  . diazepam (VALIUM) 2 MG tablet Take 2 mg by mouth at bedtime as needed for muscle spasms or anxiety.    . gabapentin (NEURONTIN) 600 MG tablet Take 1,200 mg by mouth 3 (three) times daily.    Marland Kitchen letrozole (FEMARA) 2.5 MG tablet Take 1 tablet (2.5 mg total) by mouth daily. 90 tablet 3  . oxymorphone (OPANA) 10 MG tablet Take 10 mg  by mouth 2 (two) times daily.     . sertraline (ZOLOFT) 100 MG tablet Take 1 tablet (100 mg total) by mouth daily. In am 30 tablet 5   No current facility-administered medications for this visit.     PHYSICAL EXAMINATION: ECOG PERFORMANCE STATUS: 1 - Symptomatic but completely  ambulatory  Vitals:   08/16/17 1000  BP: 133/61  Pulse: 72  Resp: 17  Temp: 98 F (36.7 C)  SpO2: 99%   Filed Weights   08/16/17 1000  Weight: 127 lb 4.8 oz (57.7 kg)    GENERAL:alert, no distress and comfortable SKIN: skin color, texture, turgor are normal, no rashes or significant lesions EYES: normal, Conjunctiva are pink and non-injected, sclera clear OROPHARYNX:no exudate, no erythema and lips, buccal mucosa, and tongue normal  NECK: supple, thyroid normal size, non-tender, without nodularity LYMPH:  no palpable lymphadenopathy in the cervical, axillary or inguinal LUNGS: clear to auscultation and percussion with normal breathing effort HEART: regular rate & rhythm and no murmurs and no lower extremity edema ABDOMEN:abdomen soft, non-tender and normal bowel sounds MUSCULOSKELETAL:no cyanosis of digits and no clubbing  NEURO: alert & oriented x 3 with fluent speech, no focal motor/sensory deficits EXTREMITIES: No lower extremity edema BREAST: No palpable masses or nodules in either right or left breasts. No palpable axillary supraclavicular or infraclavicular adenopathy no breast tenderness or nipple discharge. (exam performed in the presence of a chaperone)  LABORATORY DATA:  I have reviewed the data as listed CMP Latest Ref Rng & Units 02/13/2017 01/25/2017 05/16/2016  Glucose 70 - 140 mg/dL 100 87 103(H)  BUN 7 - 26 mg/dL 8 10 8   Creatinine 0.70 - 1.30 mg/dL 0.67(L) 0.50 0.52  Sodium 136 - 145 mmol/L 142 135 138  Potassium 3.3 - 4.7 mmol/L 3.5 3.9 4.4  Chloride 98 - 109 mmol/L 104 99 101  CO2 22 - 29 mmol/L 24 29 27   Calcium 8.4 - 10.4 mg/dL 9.8 9.2 9.7  Total Protein 6.4 - 8.3 g/dL 7.8 7.4 -  Total Bilirubin 0.2 - 1.2 mg/dL 0.7 0.4 -  Alkaline Phos 40 - 150 U/L 103 82 -  AST 5 - 34 U/L 25 18 -  ALT 0 - 55 U/L 22 21 -    Lab Results  Component Value Date   WBC 7.1 02/13/2017   HGB 14.9 02/13/2017   HCT 44.1 02/13/2017   MCV 99.6 02/13/2017   PLT 203  02/13/2017   NEUTROABS 5.8 02/13/2017    ASSESSMENT & PLAN:  Malignant neoplasm of lower-outer quadrant of left breast of female, estrogen receptor positive ([place]) 04/12/2016 Left breast biopsy/ lumpectomy: IDC grade 10.8 cm with DCIS grade 1, margins negative, ER 100%, PR 100%, HER-2 negative, Ki-67 15%  PET/CT scan: Negative for metastatic disease Sentinel lymph node study: 1/4 sentinel nodes positive with extracapsular extension  Mammaprint low-risk: 10 year risk of recurrence 10% Adjuvant radiation therapy 07/24/2016- 09/07/2016  Treatment plan: Adjuvant antiestrogen therapy with anastrozole 1 mg daily 7 years switched to letrozole December 2018  Letrozole toxicities: Very occasional hot flashes.  Denies any major trouble from this.  Emotionally she appears to be doing very well.  Breast cancer surveillance: 1.  Breast exam 08/16/2017: Benign  2. mammogram 05/01/2017: Benign breast density category C 3. CT chest 05/14/2017: Postradiation changes in the anterior left upper lobe  Return to clinic in  1 year for follow-up    No orders of the defined types were placed in this encounter.  The patient has  a good understanding of the overall plan. she agrees with it. she will call with any problems that may develop before the next visit here.   Harriette Ohara, MD 08/16/17

## 2017-08-16 NOTE — Assessment & Plan Note (Signed)
04/12/2016 Left breast biopsy/ lumpectomy: IDC grade 10.8 cm with DCIS grade 1, margins negative, ER 100%, PR 100%, HER-2 negative, Ki-67 15%  PET/CT scan: Negative for metastatic disease Sentinel lymph node study: 1/4 sentinel nodes positive with extracapsular extension  Mammaprint low-risk: 10 year risk of recurrence 10% Adjuvant radiation therapy 07/24/2016- 09/07/2016  Treatment plan: Adjuvant antiestrogen therapy with anastrozole 1 mg daily 7 years switched to letrozole December 2018  Letrozole toxicities: Very occasional hot flashes.  Denies any major trouble from this.  Emotionally she appears to be doing very well.  Breast cancer surveillance: 1.  Breast exam 08/16/2017: Benign  2. mammogram 05/01/2017: Benign breast density category C 3. CT chest 05/14/2017: Postradiation changes in the anterior left upper lobe  Return to clinic in  1 year for follow-up

## 2017-08-16 NOTE — Telephone Encounter (Signed)
Denies headache or any more blurred vision currently.  States she plans to rest and relax the rest of the day.  Appoint scheduled for Monday August 19, 2017  With Dr. Marigene Ehlers - Jacklynn Lewis. Pt. Voiced understanding.  Advised pt. To call back if symptoms worsen. Voiced understanding.

## 2017-08-16 NOTE — Telephone Encounter (Signed)
Incoming call from patient stating she was at an oncology appointment and her blood pressure was 160/118.  They had her to lay down.  When reassesed the diastolic was 69.  Patient can't remember what systolic was.  States she did experience a little blurred vision at the time.  Patient reports she is under a emotional stress. This was approximately 130pm.  The blood pressure was taken by an automatic machine.  Patient has a history of hypertension. On medication  Norvasc. Had her daily dose.   Answer Assessment - Initial Assessment Questions 1. BLOOD PRESSURE: "What is the blood pressure?" "Did you take at least two measurements 5 minutes apart?"     160/118  2. ONSET: "When did you take your blood pressure?"     1:30pm 3. HOW: "How did you obtain the blood pressure?" (e.g., visiting nurse, automatic home BP monitor)      automatic 4. HISTORY: "Do you have a history of high blood pressure?"     yes 5. MEDICATIONS: "Are you taking any medications for blood pressure?" "Have you missed any doses recently?"     notes 6. OTHER SYMPTOMS: "Do you have any symptoms?" (e.g., headache, chest pain, blurred vision, difficulty breathing, weakness)     Blurred vision a little  Protocols used: HIGH BLOOD PRESSURE-A-AH

## 2017-08-19 ENCOUNTER — Ambulatory Visit (INDEPENDENT_AMBULATORY_CARE_PROVIDER_SITE_OTHER): Payer: PPO | Admitting: Internal Medicine

## 2017-08-19 ENCOUNTER — Encounter: Payer: Self-pay | Admitting: Internal Medicine

## 2017-08-19 VITALS — BP 150/78 | HR 78 | Temp 98.5°F | Resp 16 | Ht 66.0 in | Wt 126.1 lb

## 2017-08-19 DIAGNOSIS — C50512 Malignant neoplasm of lower-outer quadrant of left female breast: Secondary | ICD-10-CM | POA: Diagnosis not present

## 2017-08-19 DIAGNOSIS — F419 Anxiety disorder, unspecified: Secondary | ICD-10-CM

## 2017-08-19 DIAGNOSIS — M8580 Other specified disorders of bone density and structure, unspecified site: Secondary | ICD-10-CM

## 2017-08-19 DIAGNOSIS — F329 Major depressive disorder, single episode, unspecified: Secondary | ICD-10-CM

## 2017-08-19 DIAGNOSIS — Z720 Tobacco use: Secondary | ICD-10-CM | POA: Diagnosis not present

## 2017-08-19 DIAGNOSIS — I10 Essential (primary) hypertension: Secondary | ICD-10-CM | POA: Diagnosis not present

## 2017-08-19 DIAGNOSIS — Z170 Estrogen receptor positive status [ER+]: Secondary | ICD-10-CM | POA: Diagnosis not present

## 2017-08-19 DIAGNOSIS — F339 Major depressive disorder, recurrent, unspecified: Secondary | ICD-10-CM

## 2017-08-19 MED ORDER — AMLODIPINE BESYLATE 5 MG PO TABS: 5.0000 mg | ORAL_TABLET | Freq: Every day | ORAL | 3 refills | Status: DC

## 2017-08-19 NOTE — Progress Notes (Signed)
Chief Complaint  Patient presents with  . Hypertension   F/u  1. HTN elevated on Friday 168/118 daughter overdosed on multiple drugs and she was stressed out on norvasc 2.5 mg qd. Also still smoking cigarettes  2. Anxiety and mood recently made worse by #1 PHQ 9 score 16 today  3. H/o breast cancer on femara 2.5 mg qd will be on x 7 years breast cancer in remission 05/01/17 mammogram normal  4. Appetite reduced eating 1 meal daily    Review of Systems  Constitutional: Negative for weight loss.  HENT: Negative for hearing loss.        Denies hoarse voice   Eyes: Negative for blurred vision.  Respiratory: Negative for shortness of breath.   Cardiovascular: Negative for chest pain.  Gastrointestinal:       +reduced appetite   Skin: Negative for rash.  Psychiatric/Behavioral: Positive for depression. The patient is nervous/anxious.    Past Medical History:  Diagnosis Date  . Anxiety   . Breast cancer ([place])    Left breast(nipple mass) 2018  . Cervical stenosis of spine   . Chicken pox   . Chronic back pain   . Colon polyps   . Cyst of left nipple   . Depression   . Hypertension   . Insomnia   . Personal history of radiation therapy    Past Surgical History:  Procedure Laterality Date  . BACK SURGERY     x 3 (2010 x 2; 2013 x 1)   . BREAST BIOPSY Left    core- neg  . BREAST LUMPECTOMY Left   . BREAST SURGERY Left    breast biopsy  . CERVICAL CONE BIOPSY    . CERVICAL FUSION     times 2  . COLONOSCOPY WITH PROPOFOL N/A 04/18/2015   Procedure: COLONOSCOPY WITH PROPOFOL;  Surgeon: [name], MD;  Location: [facility] ENDOSCOPY;  Service: Endoscopy;  Laterality: N/A;  . ELBOW SURGERY     2014 b/l elbows per pt ulnar nerve   . ELBOW SURGERY     x2   . EPIDURAL BLOCK INJECTION     Dr. Maryjean Ka   . MASS EXCISION Left 04/12/2016   Procedure: EXCISION OF LEFT NIPPLE MASS;  Surgeon: Stark Klein, MD;  Location: [place];  Service: General;  Laterality: Left;   . NECK SURGERY    . POSTERIOR CERVICAL FUSION/FORAMINOTOMY  03/07/2011   Procedure: POSTERIOR CERVICAL FUSION/FORAMINOTOMY LEVEL 4;  Surgeon: Eustace Moore, MD;  Location: [place] NEURO ORS;  Service: Neurosurgery;  Laterality: N/A;  cervical three to thoracic one posterior cervical fusion   . SENTINEL NODE BIOPSY Left 05/29/2016   Procedure: LEFT SENTINEL LYMPH NODE BIOPSY;  Surgeon: Stark Klein, MD;  Location: [place];  Service: General;  Laterality: Left;  . TONSILLECTOMY     Family History  Problem Relation Age of Onset  . Breast cancer Mother 49  . Hypertension Mother   . Cancer Mother        breast dx'ed 83   . Cancer Father        prostate  . Hypertension Father   . Depression Daughter   . Hypertension Maternal Grandmother   . Breast cancer Cousin    Social History   Socioeconomic History  . Marital status: Single    Spouse name: Not on file  . Number of children: Not on file  . Years of education: Not on file  . Highest education level: Not on file  Occupational History  . Not on file  Social Needs  . Financial resource strain: Not on file  . Food insecurity:    Worry: Not on file    Inability: Not on file  . Transportation needs:    Medical: Not on file    Non-medical: Not on file  Tobacco Use  . Smoking status: Current Every Day Smoker    Packs/day: 1.00    Years: 30.00    Pack years: 30.00    Types: Cigarettes  . Smokeless tobacco: Never Used  Substance and Sexual Activity  . Alcohol use: Yes    Comment: social  . Drug use: No    Comment: on opana since jan 2018  . Sexual activity: Not on file  Lifestyle  . Physical activity:    Days per week: Not on file    Minutes per session: Not on file  . Stress: Not on file  Relationships  . Social connections:    Talks on phone: Not on file    Gets together: Not on file    Attends religious service: Not on file    Active member of club or organization: Not on file    Attends meetings of clubs or organizations:  Not on file    Relationship status: Not on file  . Intimate partner violence:    Fear of current or ex partner: Not on file    Emotionally abused: Not on file    Physically abused: Not on file    Forced sexual activity: Not on file  Other Topics Concern  . Not on file  Social History Narrative   As of 01/25/17 not sexually active of in relationship    Divorced   2 kids son and daughter    Disability    Loves animals        No outpatient medications have been marked as taking for the 08/19/17 encounter (Office Visit) with McLean-Scocuzza, Nino Glow, MD.   Allergies  Allergen Reactions  . Sulfa Antibiotics Itching and Rash  . Sulfonamide Derivatives Itching and Rash   No results found for this or any previous visit (from the past 2160 hour(s)). Objective  Body mass index is 20.36 kg/m. Wt Readings from Last 3 Encounters:  08/19/17 126 lb 2 oz (57.2 kg)  08/16/17 127 lb 4.8 oz (57.7 kg)  05/03/17 125 lb 4 oz (56.8 kg)   Temp Readings from Last 3 Encounters:  08/19/17 98.5 F (36.9 C) (Oral)  08/16/17 98 F (36.7 C) (Oral)  05/03/17 98 F (36.7 C) (Oral)   BP Readings from Last 3 Encounters:  08/19/17 (!) 150/78  08/16/17 133/61  05/03/17 140/76   Pulse Readings from Last 3 Encounters:  08/19/17 78  08/16/17 72  05/03/17 96    Physical Exam  Constitutional: She is oriented to person, place, and time. She appears well-developed and well-nourished. She is cooperative.  HENT:  Head: Normocephalic and atraumatic.  Mouth/Throat: Oropharynx is clear and moist and mucous membranes are normal.  Eyes: Pupils are equal, round, and reactive to light. Conjunctivae are normal.  Cardiovascular: Normal rate, regular rhythm and normal heart sounds.  Pulmonary/Chest: Effort normal and breath sounds normal.  Neurological: She is alert and oriented to person, place, and time. Gait normal.  Skin: Skin is warm, dry and intact.  Psychiatric: She has a normal mood and affect. Her  speech is normal and behavior is normal. Judgment and thought content normal. Cognition and memory are normal.  Nursing note and  vitals reviewed.   Assessment   1. HTN  2. Anxiety and depression PHQ 9 score 16 today 3. History of breast cancer left breast  4. Osteopenia  5. HM Plan  1. Inc norvasc to 5 mg qd  2. Cont zolfot 100 mg qd  3. On femara 2.5 mg qhs will need to take x 7 years  mammo 05/01/17 normal  F/u with H/O in 1 year 08/17/2018 [name]  4. rec vit D taking 10000 IU weekly and rec calcim 1200 mg qd  5.  Had flu shot  Tdap UTD  Given info about pna 23 vaccine should do b/c smoker consider in future  rec shingrix vaccine as well for future   Pap from [facility]  -02/10/15 neg pap  mammo 05/01/17 negative h/o breast cancer dx mammo due in 1 year diagnostic mammogram  dexa 05/01/17 osteopenia  Colonoscopy had 04/18/15 see report  Hep C neg 01/20/16  Consider HIV testing in future will disc with pt  rec smoking cessation   Provider: Dr. Olivia Mackie McLean-Scocuzza-Internal Medicine

## 2017-08-19 NOTE — Patient Instructions (Addendum)
Premier protein shake 2x per day  Increase norvasc to 5 mg daily  Check your blood pressure at work goal <140/<90  3 months  Please take calcium 1200 mg total per day    Hypertension Hypertension, commonly called high blood pressure, is when the force of blood pumping through the arteries is too strong. The arteries are the blood vessels that carry blood from the heart throughout the body. Hypertension forces the heart to work harder to pump blood and may cause arteries to become narrow or stiff. Having untreated or uncontrolled hypertension can cause heart attacks, strokes, kidney disease, and other problems. A blood pressure reading consists of a higher number over a lower number. Ideally, your blood pressure should be below 120/80. The first ("top") number is called the systolic pressure. It is a measure of the pressure in your arteries as your heart beats. The second ("bottom") number is called the diastolic pressure. It is a measure of the pressure in your arteries as the heart relaxes. What are the causes? The cause of this condition is not known. What increases the risk? Some risk factors for high blood pressure are under your control. Others are not. Factors you can change  Smoking.  Having type 2 diabetes mellitus, high cholesterol, or both.  Not getting enough exercise or physical activity.  Being overweight.  Having too much fat, sugar, calories, or salt (sodium) in your diet.  Drinking too much alcohol. Factors that are difficult or impossible to change  Having chronic kidney disease.  Having a family history of high blood pressure.  Age. Risk increases with age.  Race. You may be at higher risk if you are African-American.  Gender. Men are at higher risk than women before age 88. After age 7, women are at higher risk than men.  Having obstructive sleep apnea.  Stress. What are the signs or symptoms? Extremely high blood pressure (hypertensive crisis) may  cause:  Headache.  Anxiety.  Shortness of breath.  Nosebleed.  Nausea and vomiting.  Severe chest pain.  Jerky movements you cannot control (seizures).  How is this diagnosed? This condition is diagnosed by measuring your blood pressure while you are seated, with your arm resting on a surface. The cuff of the blood pressure monitor will be placed directly against the skin of your upper arm at the level of your heart. It should be measured at least twice using the same arm. Certain conditions can cause a difference in blood pressure between your right and left arms. Certain factors can cause blood pressure readings to be lower or higher than normal (elevated) for a short period of time:  When your blood pressure is higher when you are in a health care provider's office than when you are at home, this is called white coat hypertension. Most people with this condition do not need medicines.  When your blood pressure is higher at home than when you are in a health care provider's office, this is called masked hypertension. Most people with this condition may need medicines to control blood pressure.  If you have a high blood pressure reading during one visit or you have normal blood pressure with other risk factors:  You may be asked to return on a different day to have your blood pressure checked again.  You may be asked to monitor your blood pressure at home for 1 week or longer.  If you are diagnosed with hypertension, you may have other blood or imaging tests to help  your health care provider understand your overall risk for other conditions. How is this treated? This condition is treated by making healthy lifestyle changes, such as eating healthy foods, exercising more, and reducing your alcohol intake. Your health care provider may prescribe medicine if lifestyle changes are not enough to get your blood pressure under control, and if:  Your systolic blood pressure is above  130.  Your diastolic blood pressure is above 80.  Your personal target blood pressure may vary depending on your medical conditions, your age, and other factors. Follow these instructions at home: Eating and drinking  Eat a diet that is high in fiber and potassium, and low in sodium, added sugar, and fat. An example eating plan is called the DASH (Dietary Approaches to Stop Hypertension) diet. To eat this way: ? Eat plenty of fresh fruits and vegetables. Try to fill half of your plate at each meal with fruits and vegetables. ? Eat whole grains, such as whole wheat pasta, brown rice, or whole grain bread. Fill about one quarter of your plate with whole grains. ? Eat or drink low-fat dairy products, such as skim milk or low-fat yogurt. ? Avoid fatty cuts of meat, processed or cured meats, and poultry with skin. Fill about one quarter of your plate with lean proteins, such as fish, chicken without skin, beans, eggs, and tofu. ? Avoid premade and processed foods. These tend to be higher in sodium, added sugar, and fat.  Reduce your daily sodium intake. Most people with hypertension should eat less than 1,500 mg of sodium a day.  Limit alcohol intake to no more than 1 drink a day for nonpregnant women and 2 drinks a day for men. One drink equals 12 oz of beer, 5 oz of wine, or 1 oz of hard liquor. Lifestyle  Work with your health care provider to maintain a healthy body weight or to lose weight. Ask what an ideal weight is for you.  Get at least 30 minutes of exercise that causes your heart to beat faster (aerobic exercise) most days of the week. Activities may include walking, swimming, or biking.  Include exercise to strengthen your muscles (resistance exercise), such as pilates or lifting weights, as part of your weekly exercise routine. Try to do these types of exercises for 30 minutes at least 3 days a week.  Do not use any products that contain nicotine or tobacco, such as cigarettes and  e-cigarettes. If you need help quitting, ask your health care provider.  Monitor your blood pressure at home as told by your health care provider.  Keep all follow-up visits as told by your health care provider. This is important. Medicines  Take over-the-counter and prescription medicines only as told by your health care provider. Follow directions carefully. Blood pressure medicines must be taken as prescribed.  Do not skip doses of blood pressure medicine. Doing this puts you at risk for problems and can make the medicine less effective.  Ask your health care provider about side effects or reactions to medicines that you should watch for. Contact a health care provider if:  You think you are having a reaction to a medicine you are taking.  You have headaches that keep coming back (recurring).  You feel dizzy.  You have swelling in your ankles.  You have trouble with your vision. Get help right away if:  You develop a severe headache or confusion.  You have unusual weakness or numbness.  You feel faint.  You have  severe pain in your chest or abdomen.  You vomit repeatedly.  You have trouble breathing. Summary  Hypertension is when the force of blood pumping through your arteries is too strong. If this condition is not controlled, it may put you at risk for serious complications.  Your personal target blood pressure may vary depending on your medical conditions, your age, and other factors. For most people, a normal blood pressure is less than 120/80.  Hypertension is treated with lifestyle changes, medicines, or a combination of both. Lifestyle changes include weight loss, eating a healthy, low-sodium diet, exercising more, and limiting alcohol. This information is not intended to replace advice given to you by your health care provider. Make sure you discuss any questions you have with your health care provider. Document Released: 01/22/2005 Document Revised: 12/21/2015  Document Reviewed: 12/21/2015 Elsevier Interactive Patient Education  [employer].

## 2017-08-20 ENCOUNTER — Telehealth: Payer: Self-pay | Admitting: Hematology and Oncology

## 2017-08-20 NOTE — Telephone Encounter (Signed)
Mailed patient calendar of upcoming appts.  °

## 2017-09-25 ENCOUNTER — Telehealth: Payer: Self-pay | Admitting: Internal Medicine

## 2017-09-25 NOTE — Telephone Encounter (Signed)
Copied from [place] [phone]. Topic: Quick Communication - See Telephone Encounter >> Sep 25, 2017  4:51 PM Waldemar Dickens, Valda Favia wrote: Pt is calling in wanting to know if Welbutrin can be added with her zoloft. Please call pt with advise due to pt stating she is having pain and needs a boost from her daughter previously trying to commit suicide   Cb# [phone]

## 2017-09-26 NOTE — Telephone Encounter (Signed)
I would suggest that she needs follow-up with Dr. Terese Door to discuss her depression and medication regimen.

## 2017-09-26 NOTE — Telephone Encounter (Signed)
I have called and left a message for patient to return call to the office

## 2017-09-26 NOTE — Telephone Encounter (Signed)
Pt returning call stating that she wanted to add Wellbutrin to her current medication regimen along with Zoloft. 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  Pt tearful intermittently through out the call and states she has been under alot of stress and trying to deal with her daughter trying to comment suicide on 6/14 and having an addiction. Pt denies any SI or HI at this time. Pt states she has experienced an increase in hand pain but has an appt scheduled for 8/29 with a pain specialist who also works with a neurosurgeon so "dealing with that is covered".

## 2017-09-26 NOTE — Telephone Encounter (Signed)
Patient was triaged by [facility] Nurse please see encounter below.

## 2017-09-27 NOTE — Telephone Encounter (Signed)
Pt aware & transferred up front to make a sooner appt.

## 2017-09-30 ENCOUNTER — Encounter: Payer: Self-pay | Admitting: Internal Medicine

## 2017-09-30 ENCOUNTER — Ambulatory Visit (INDEPENDENT_AMBULATORY_CARE_PROVIDER_SITE_OTHER): Payer: PPO | Admitting: Internal Medicine

## 2017-09-30 VITALS — BP 126/68 | HR 70 | Temp 98.4°F | Ht 66.0 in | Wt 122.8 lb

## 2017-09-30 DIAGNOSIS — F339 Major depressive disorder, recurrent, unspecified: Secondary | ICD-10-CM

## 2017-09-30 DIAGNOSIS — F419 Anxiety disorder, unspecified: Secondary | ICD-10-CM

## 2017-09-30 DIAGNOSIS — F329 Major depressive disorder, single episode, unspecified: Secondary | ICD-10-CM | POA: Diagnosis not present

## 2017-09-30 DIAGNOSIS — I10 Essential (primary) hypertension: Secondary | ICD-10-CM

## 2017-09-30 DIAGNOSIS — F32A Depression, unspecified: Secondary | ICD-10-CM

## 2017-09-30 MED ORDER — BUPROPION HCL ER (XL) 150 MG PO TB24: 150.0000 mg | ORAL_TABLET | Freq: Every day | ORAL | 5 refills | Status: DC

## 2017-09-30 MED ORDER — SERTRALINE HCL 100 MG PO TABS: 150.0000 mg | ORAL_TABLET | Freq: Every day | ORAL | 5 refills | Status: DC

## 2017-09-30 NOTE — Progress Notes (Signed)
Pre visit review using our clinic review tool, if applicable. No additional management support is needed unless otherwise documented below in the visit note. 

## 2017-09-30 NOTE — Progress Notes (Signed)
Chief Complaint  Patient presents with  . Follow-up   Anxiety/depression worse due to chronic pain, stress from daughter being SI she feels like isolating on zoloft 100 mg not helping and not eating. PHQ 9 score 18 and GAD 7 score 18 today declines to see therapy or psychiatry for now due to time no SI.  HTN controlled on Norvasc 5 mg qd   Review of Systems  Constitutional: Negative for weight loss.  HENT: Negative for hearing loss.   Eyes:       Right eyelid drooping since childhood worse with upset or tired    Respiratory: Negative for shortness of breath.   Cardiovascular: Negative for chest pain.  Gastrointestinal: Negative for abdominal pain.  Musculoskeletal: Positive for neck pain. Negative for falls.  Skin: Negative for rash.  Neurological: Positive for sensory change. Negative for headaches.  Psychiatric/Behavioral: Positive for depression. Negative for suicidal ideas. The patient is nervous/anxious.    Past Medical History:  Diagnosis Date  . Anxiety   . Breast cancer ([place])    Left breast(nipple mass) 2018  . Cervical stenosis of spine   . Chicken pox   . Chronic back pain   . Colon polyps   . Cyst of left nipple   . Depression   . Hypertension   . Insomnia   . Personal history of radiation therapy    Past Surgical History:  Procedure Laterality Date  . BACK SURGERY     x 3 (2010 x 2; 2013 x 1)   . BREAST BIOPSY Left    core- neg  . BREAST LUMPECTOMY Left   . BREAST SURGERY Left    breast biopsy  . CERVICAL CONE BIOPSY    . CERVICAL FUSION     times 2  . COLONOSCOPY WITH PROPOFOL N/A 04/18/2015   Procedure: COLONOSCOPY WITH PROPOFOL;  Surgeon: [name], MD;  Location: [facility] ENDOSCOPY;  Service: Endoscopy;  Laterality: N/A;  . ELBOW SURGERY     2014 b/l elbows per pt ulnar nerve   . ELBOW SURGERY     x2   . EPIDURAL BLOCK INJECTION     Dr. Maryjean Ka   . MASS EXCISION Left 04/12/2016   Procedure: EXCISION OF LEFT NIPPLE MASS;  Surgeon: Stark Klein, MD;  Location: [place];  Service: General;  Laterality: Left;  . NECK SURGERY    . POSTERIOR CERVICAL FUSION/FORAMINOTOMY  03/07/2011   Procedure: POSTERIOR CERVICAL FUSION/FORAMINOTOMY LEVEL 4;  Surgeon: Eustace Moore, MD;  Location: [place] NEURO ORS;  Service: Neurosurgery;  Laterality: N/A;  cervical three to thoracic one posterior cervical fusion   . SENTINEL NODE BIOPSY Left 05/29/2016   Procedure: LEFT SENTINEL LYMPH NODE BIOPSY;  Surgeon: Stark Klein, MD;  Location: [place];  Service: General;  Laterality: Left;  . TONSILLECTOMY     Family History  Problem Relation Age of Onset  . Breast cancer Mother 25  . Hypertension Mother   . Cancer Mother        breast dx'ed 45   . Cancer Father        prostate  . Hypertension Father   . Depression Daughter   . Hypertension Maternal Grandmother   . Breast cancer Cousin    Social History   Socioeconomic History  . Marital status: Single    Spouse name: Not on file  . Number of children: Not on file  . Years of education: Not on file  . Highest education level: Not on  file  Occupational History  . Not on file  Social Needs  . Financial resource strain: Not on file  . Food insecurity:    Worry: Not on file    Inability: Not on file  . Transportation needs:    Medical: Not on file    Non-medical: Not on file  Tobacco Use  . Smoking status: Current Every Day Smoker    Packs/day: 1.00    Years: 30.00    Pack years: 30.00    Types: Cigarettes  . Smokeless tobacco: Never Used  Substance and Sexual Activity  . Alcohol use: Yes    Comment: social  . Drug use: No    Comment: on opana since jan 2018  . Sexual activity: Not on file  Lifestyle  . Physical activity:    Days per week: Not on file    Minutes per session: Not on file  . Stress: Not on file  Relationships  . Social connections:    Talks on phone: Not on file    Gets together: Not on file    Attends religious service: Not on file    Active  member of club or organization: Not on file    Attends meetings of clubs or organizations: Not on file    Relationship status: Not on file  . Intimate partner violence:    Fear of current or ex partner: Not on file    Emotionally abused: Not on file    Physically abused: Not on file    Forced sexual activity: Not on file  Other Topics Concern  . Not on file  Social History Narrative   As of 01/25/17 not sexually active of in relationship    Divorced   2 kids son and daughter    [place] has 3 dogs           Current Meds  Medication Sig  . amLODipine (NORVASC) 5 MG tablet Take 1 tablet (5 mg total) by mouth daily. In am  . Cholecalciferol (D3 ADULT PO) Take by mouth. 10000 IU weekly  . cyanocobalamin 100 MCG tablet Take 100 mcg by mouth daily.  . diazepam (VALIUM) 2 MG tablet Take 2 mg by mouth at bedtime as needed for muscle spasms or anxiety.  . gabapentin (NEURONTIN) 600 MG tablet Take 1,200 mg by mouth 3 (three) times daily.  Marland Kitchen letrozole (FEMARA) 2.5 MG tablet Take 1 tablet (2.5 mg total) by mouth daily.  Marland Kitchen oxymorphone (OPANA) 10 MG tablet Take 10 mg by mouth 2 (two) times daily.   . sertraline (ZOLOFT) 100 MG tablet Take 1.5 tablets (150 mg total) by mouth daily. In am  . [DISCONTINUED] sertraline (ZOLOFT) 100 MG tablet Take 1 tablet (100 mg total) by mouth daily. In am   Allergies  Allergen Reactions  . Sulfa Antibiotics Itching and Rash  . Sulfonamide Derivatives Itching and Rash   No results found for this or any previous visit (from the past 2160 hour(s)). Objective  Body mass index is 19.82 kg/m. Wt Readings from Last 3 Encounters:  09/30/17 122 lb 12.8 oz (55.7 kg)  08/19/17 126 lb 2 oz (57.2 kg)  08/16/17 127 lb 4.8 oz (57.7 kg)   Temp Readings from Last 3 Encounters:  09/30/17 98.4 F (36.9 C) (Oral)  08/19/17 98.5 F (36.9 C) (Oral)  08/16/17 98 F (36.7 C) (Oral)   BP Readings from Last 3 Encounters:  09/30/17 126/68  08/19/17  (!) 150/78  08/16/17 133/61  Pulse Readings from Last 3 Encounters:  09/30/17 70  08/19/17 78  08/16/17 72    Physical Exam  Constitutional: She is oriented to person, place, and time. Vital signs are normal. She appears well-developed and well-nourished. She is cooperative.  HENT:  Head: Normocephalic and atraumatic.  Mouth/Throat: Oropharynx is clear and moist and mucous membranes are normal.  Eyes: Pupils are equal, round, and reactive to light. Conjunctivae are normal.  Cardiovascular: Normal rate, regular rhythm and normal heart sounds.  Pulmonary/Chest: Effort normal and breath sounds normal.  Neurological: She is alert and oriented to person, place, and time. Gait normal.  Skin: Skin is warm, dry and intact.  Psychiatric: She has a normal mood and affect. Her speech is normal and behavior is normal. Judgment and thought content normal. Cognition and memory are normal.  Nursing note and vitals reviewed.   Assessment   1. Anxiety/depression GAD 7 and PHQ 9 score both 18 uncontrolled  2. HTN  3. HM Plan   1. Inc zoloft to 150 mg qd add wellbutrin xl 150 mg qd  2. Cont norvasc 5 mg qd  3.  Had flu shot  Tdap UTD  Given info about pna 23 vaccine should do b/c smoker consider in future  rec shingrix vaccine as well for future   Pap from [facility] -02/10/15 neg pap mammo 05/01/17 negative h/o breast cancer dx mammo due in 1 year diagnostic mammogram  dexa 05/01/17 osteopenia  Colonoscopy had 04/18/15 see report Hep C neg 01/20/16  Consider HIV testing in future will disc with pt  rec smoking cessation    Provider: Dr. Olivia Mackie McLean-Scocuzza-Internal Medicine

## 2017-09-30 NOTE — Patient Instructions (Addendum)
Call back or send message in 2 weeks follow up in 2-3 months   Glucosamine chondroitin or Tumeric* for bone pain/arthritis may help     Cervical Radiculopathy Cervical radiculopathy happens when a nerve in the neck (cervical nerve) is pinched or bruised. This condition can develop because of an injury or as part of the normal aging process. Pressure on the cervical nerves can cause pain or numbness that runs from the neck all the way down into the arm and fingers. Usually, this condition gets better with rest. Treatment may be needed if the condition does not improve. What are the causes? This condition may be caused by:  Injury.  Slipped (herniated) disk.  Muscle tightness in the neck because of overuse.  Arthritis.  Breakdown or degeneration in the bones and joints of the spine (spondylosis) due to aging.  Bone spurs that may develop near the cervical nerves.  What are the signs or symptoms? Symptoms of this condition include:  Pain that runs from the neck to the arm and hand. The pain can be severe or irritating. It may be worse when the neck is moved.  Numbness or weakness in the affected arm and hand.  How is this diagnosed? This condition may be diagnosed based on symptoms, medical history, and a physical exam. You may also have tests, including:  X-rays.  CT scan.  MRI.  Electromyogram (EMG).  Nerve conduction tests.  How is this treated? In many cases, treatment is not needed for this condition. With rest, the condition usually gets better over time. If treatment is needed, options may include:  Wearing a soft neck collar for short periods of time.  Physical therapy to strengthen your neck muscles.  Medicines, such as NSAIDs, oral corticosteroids, or spinal injections.  Surgery. This may be needed if other treatments do not help. Various types of surgery may be done depending on the cause of your problems.  Follow these instructions at home: Managing  pain  Take over-the-counter and prescription medicines only as told by your health care provider.  If directed, apply ice to the affected area. ? Put ice in a plastic bag. ? Place a towel between your skin and the bag. ? Leave the ice on for 20 minutes, 2-3 times per day.  If ice does not help, you can try using heat. Take a warm shower or warm bath, or use a heat pack as told by your health care provider.  Try a gentle neck and shoulder massage to help relieve symptoms. Activity  Rest as needed. Follow instructions from your health care provider about any restrictions on activities.  Do stretching and strengthening exercises as told by your health care provider or physical therapist. General instructions  If you were given a soft collar, wear it as told by your health care provider.  Use a flat pillow when you sleep.  Keep all follow-up visits as told by your health care provider. This is important. Contact a health care provider if:  Your condition does not improve with treatment. Get help right away if:  Your pain gets much worse and cannot be controlled with medicines.  You have weakness or numbness in your hand, arm, face, or leg.  You have a high fever.  You have a stiff, rigid neck.  You lose control of your bowels or your bladder (have incontinence).  You have trouble with walking, balance, or speaking. This information is not intended to replace advice given to you by your health  care provider. Make sure you discuss any questions you have with your health care provider. Document Released: 10/17/2000 Document Revised: 06/30/2015 Document Reviewed: 03/18/2014 Elsevier Interactive Patient Education  2018 [employer].   Major Depressive Disorder, Adult Major depressive disorder (MDD) is a mental health condition. MDD often makes you feel sad, hopeless, or helpless. MDD can also cause symptoms in your body. MDD can affect  your:  Work.  School.  Relationships.  Other normal activities.  MDD can range from mild to very bad. It may occur once (single episode MDD). It can also occur many times (recurrent MDD). The main symptoms of MDD often include:  Feeling sad, depressed, or irritable most of the time.  Loss of interest.  MDD symptoms also include:  Sleeping too much or too little.  Eating too much or too little.  A change in your weight.  Feeling tired (fatigue) or having low energy.  Feeling worthless.  Feeling guilty.  Trouble making decisions.  Trouble thinking clearly.  Thoughts of suicide or harming others.  Feeling weak.  Feeling agitated.  Keeping yourself from being around other people (isolation).  Follow these instructions at home: Activity  Do these things as told by your doctor: ? Go back to your normal activities. ? Exercise regularly. ? Spend time outdoors. Alcohol  Talk with your doctor about how alcohol can affect your antidepressant medicines.  Do not drink alcohol. Or, limit how much alcohol you drink. ? This means no more than 1 drink a day for nonpregnant women and 2 drinks a day for men. One drink equals one of these:  12 oz of beer.  5 oz of wine.  1 oz of hard liquor. General instructions  Take over-the-counter and prescription medicines only as told by your doctor.  Eat a healthy diet.  Get plenty of sleep.  Find activities that you enjoy. Make time to do them.  Think about joining a support group. Your doctor may be able to suggest a group for you.  Keep all follow-up visits as told by your doctor. This is important. Where to find more information:  [employer] on Mental Illness: ? www.nami.[place]: ? https://carter.com/  National Suicide Prevention Lifeline: ? (239)389-5543. This is free, 24-hour help. Contact a doctor if:  Your symptoms get worse.  You have new symptoms. Get  help right away if:  You self-harm.  You see, hear, taste, smell, or feel things that are not present (hallucinate). If you ever feel like you may hurt yourself or others, or have thoughts about taking your own life, get help right away. You can go to your nearest emergency department or call:  Your local emergency services (911 in the [place]  A suicide crisis helpline, such as the National Suicide Prevention Lifeline: ? 905-101-0771. This is open 24 hours a day.  This information is not intended to replace advice given to you by your health care provider. Make sure you discuss any questions you have with your health care provider. Document Released: 01/03/2015 Document Revised: 10/09/2015 Document Reviewed: 10/09/2015 Elsevier Interactive Patient Education  2017 [place] After being diagnosed with an anxiety disorder, you may be relieved to know why you have felt or behaved a certain way. It is natural to also feel overwhelmed about the treatment ahead and what it will mean for your life. With care and support, you can manage this condition and recover from it. How to cope with  anxiety Dealing with stress Stress is your body's reaction to life changes and events, both good and bad. Stress can last just a few hours or it can be ongoing. Stress can play a major role in anxiety, so it is important to learn both how to cope with stress and how to think about it differently. Talk with your health care provider or a counselor to learn more about stress reduction. He or she may suggest some stress reduction techniques, such as:  Music therapy. This can include creating or listening to music that you enjoy and that inspires you.  Mindfulness-based meditation. This involves being aware of your normal breaths, rather than trying to control your breathing. It can be done while sitting or walking.  Centering prayer. This is a kind of meditation that involves focusing on a  word, phrase, or sacred image that is meaningful to you and that brings you peace.  Deep breathing. To do this, expand your stomach and inhale slowly through your nose. Hold your breath for 3-5 seconds. Then exhale slowly, allowing your stomach muscles to relax.  Self-talk. This is a skill where you identify thought patterns that lead to anxiety reactions and correct those thoughts.  Muscle relaxation. This involves tensing muscles then relaxing them.  Choose a stress reduction technique that fits your lifestyle and personality. Stress reduction techniques take time and practice. Set aside 5-15 minutes a day to do them. Therapists can offer training in these techniques. The training may be covered by some insurance plans. Other things you can do to manage stress include:  Keeping a stress diary. This can help you learn what triggers your stress and ways to control your response.  Thinking about how you respond to certain situations. You may not be able to control everything, but you can control your reaction.  Making time for activities that help you relax, and not feeling guilty about spending your time in this way.  Therapy combined with coping and stress-reduction skills provides the best chance for successful treatment. Medicines Medicines can help ease symptoms. Medicines for anxiety include:  Anti-anxiety drugs.  Antidepressants.  Beta-blockers.  Medicines may be used as the main treatment for anxiety disorder, along with therapy, or if other treatments are not working. Medicines should be prescribed by a health care provider. Relationships Relationships can play a big part in helping you recover. Try to spend more time connecting with trusted friends and family members. Consider going to couples counseling, taking family education classes, or going to family therapy. Therapy can help you and others better understand the condition. How to recognize changes in your  condition Everyone has a different response to treatment for anxiety. Recovery from anxiety happens when symptoms decrease and stop interfering with your daily activities at home or work. This may mean that you will start to:  Have better concentration and focus.  Sleep better.  Be less irritable.  Have more energy.  Have improved memory.  It is important to recognize when your condition is getting worse. Contact your health care provider if your symptoms interfere with home or work and you do not feel like your condition is improving. Where to find help and support: You can get help and support from these sources:  Self-help groups.  Online and [employer].  A trusted spiritual leader.  Couples counseling.  Family education classes.  Family therapy.  Follow these instructions at home:  Eat a healthy diet that includes plenty of vegetables, fruits, whole grains, low-fat  dairy products, and lean protein. Do not eat a lot of foods that are high in solid fats, added sugars, or salt.  Exercise. Most adults should do the following: ? Exercise for at least 150 minutes each week. The exercise should increase your heart rate and make you sweat (moderate-intensity exercise). ? Strengthening exercises at least twice a week.  Cut down on caffeine, tobacco, alcohol, and other potentially harmful substances.  Get the right amount and quality of sleep. Most adults need 7-9 hours of sleep each night.  Make choices that simplify your life.  Take over-the-counter and prescription medicines only as told by your health care provider.  Avoid caffeine, alcohol, and certain over-the-counter cold medicines. These may make you feel worse. Ask your pharmacist which medicines to avoid.  Keep all follow-up visits as told by your health care provider. This is important. Questions to ask your health care provider  Would I benefit from therapy?  How often should I follow up with a  health care provider?  How long do I need to take medicine?  Are there any long-term side effects of my medicine?  Are there any alternatives to taking medicine? Contact a health care provider if:  You have a hard time staying focused or finishing daily tasks.  You spend many hours a day feeling worried about everyday life.  You become exhausted by worry.  You start to have headaches, feel tense, or have nausea.  You urinate more than normal.  You have diarrhea. Get help right away if:  You have a racing heart and shortness of breath.  You have thoughts of hurting yourself or others. If you ever feel like you may hurt yourself or others, or have thoughts about taking your own life, get help right away. You can go to your nearest emergency department or call:  Your local emergency services (911 in the [place]  A suicide crisis helpline, such as the [place] at 754 291 3874. This is open 24-hours a day.  Summary  Taking steps to deal with stress can help calm you.  Medicines cannot cure anxiety disorders, but they can help ease symptoms.  Family, friends, and partners can play a big part in helping you recover from an anxiety disorder. This information is not intended to replace advice given to you by your health care provider. Make sure you discuss any questions you have with your health care provider. Document Released: 01/17/2016 Document Revised: 01/17/2016 Document Reviewed: 01/17/2016 Elsevier Interactive Patient Education  [employer].

## 2017-10-03 DIAGNOSIS — M961 Postlaminectomy syndrome, not elsewhere classified: Secondary | ICD-10-CM | POA: Diagnosis not present

## 2017-10-03 DIAGNOSIS — M545 Low back pain: Secondary | ICD-10-CM | POA: Diagnosis not present

## 2017-10-17 ENCOUNTER — Other Ambulatory Visit: Payer: Self-pay | Admitting: Anesthesiology

## 2017-10-17 ENCOUNTER — Ambulatory Visit: Payer: PPO | Admitting: Internal Medicine

## 2017-10-17 DIAGNOSIS — M961 Postlaminectomy syndrome, not elsewhere classified: Secondary | ICD-10-CM

## 2017-10-24 ENCOUNTER — Ambulatory Visit
Admission: RE | Admit: 2017-10-24 | Discharge: 2017-10-24 | Disposition: A | Discharge status: Home / Self Care | Payer: PPO | Source: Ambulatory Visit | Attending: Anesthesiology | Admitting: Anesthesiology

## 2017-10-24 DIAGNOSIS — M4322 Fusion of spine, cervical region: Secondary | ICD-10-CM | POA: Diagnosis not present

## 2017-10-24 DIAGNOSIS — M961 Postlaminectomy syndrome, not elsewhere classified: Secondary | ICD-10-CM

## 2017-10-24 MED ORDER — GADOBENATE DIMEGLUMINE 529 MG/ML IV SOLN
12.0000 mL | Freq: Once | INTRAVENOUS | Status: AC | PRN
  Administered 2017-10-24: 12 mL via INTRAVENOUS

## 2017-11-05 DIAGNOSIS — M961 Postlaminectomy syndrome, not elsewhere classified: Secondary | ICD-10-CM | POA: Diagnosis not present

## 2017-11-05 DIAGNOSIS — M545 Low back pain: Secondary | ICD-10-CM | POA: Diagnosis not present

## 2017-11-19 ENCOUNTER — Ambulatory Visit: Payer: PPO | Admitting: Internal Medicine

## 2017-11-26 ENCOUNTER — Encounter: Payer: Self-pay | Admitting: Internal Medicine

## 2017-11-26 ENCOUNTER — Ambulatory Visit (INDEPENDENT_AMBULATORY_CARE_PROVIDER_SITE_OTHER): Payer: PPO

## 2017-11-26 ENCOUNTER — Ambulatory Visit (INDEPENDENT_AMBULATORY_CARE_PROVIDER_SITE_OTHER): Payer: PPO | Admitting: Internal Medicine

## 2017-11-26 ENCOUNTER — Other Ambulatory Visit: Payer: Self-pay | Admitting: Internal Medicine

## 2017-11-26 VITALS — BP 144/68 | HR 71 | Temp 98.3°F | Ht 66.0 in | Wt 124.2 lb

## 2017-11-26 DIAGNOSIS — R042 Hemoptysis: Secondary | ICD-10-CM | POA: Diagnosis not present

## 2017-11-26 DIAGNOSIS — F329 Major depressive disorder, single episode, unspecified: Secondary | ICD-10-CM | POA: Diagnosis not present

## 2017-11-26 DIAGNOSIS — R05 Cough: Secondary | ICD-10-CM | POA: Diagnosis not present

## 2017-11-26 DIAGNOSIS — F419 Anxiety disorder, unspecified: Secondary | ICD-10-CM

## 2017-11-26 DIAGNOSIS — G4700 Insomnia, unspecified: Secondary | ICD-10-CM | POA: Diagnosis not present

## 2017-11-26 DIAGNOSIS — R0982 Postnasal drip: Secondary | ICD-10-CM

## 2017-11-26 DIAGNOSIS — F339 Major depressive disorder, recurrent, unspecified: Secondary | ICD-10-CM

## 2017-11-26 DIAGNOSIS — J439 Emphysema, unspecified: Secondary | ICD-10-CM | POA: Diagnosis not present

## 2017-11-26 DIAGNOSIS — F32A Depression, unspecified: Secondary | ICD-10-CM

## 2017-11-26 DIAGNOSIS — R0981 Nasal congestion: Secondary | ICD-10-CM

## 2017-11-26 DIAGNOSIS — J320 Chronic maxillary sinusitis: Secondary | ICD-10-CM

## 2017-11-26 DIAGNOSIS — I10 Essential (primary) hypertension: Secondary | ICD-10-CM

## 2017-11-26 DIAGNOSIS — Z23 Encounter for immunization: Secondary | ICD-10-CM | POA: Diagnosis not present

## 2017-11-26 DIAGNOSIS — R059 Cough, unspecified: Secondary | ICD-10-CM

## 2017-11-26 DIAGNOSIS — J449 Chronic obstructive pulmonary disease, unspecified: Secondary | ICD-10-CM

## 2017-11-26 MED ORDER — SERTRALINE HCL 100 MG PO TABS: 200.0000 mg | ORAL_TABLET | Freq: Every day | ORAL | 11 refills | Status: DC

## 2017-11-26 MED ORDER — ALBUTEROL SULFATE HFA 108 (90 BASE) MCG/ACT IN AERS: 1.0000 | INHALATION_SPRAY | RESPIRATORY_TRACT | 12 refills | Status: DC | PRN

## 2017-11-26 MED ORDER — BUPROPION HCL ER (XL) 300 MG PO TB24: 300.0000 mg | ORAL_TABLET | Freq: Every day | ORAL | 3 refills | Status: DC

## 2017-11-26 MED ORDER — FLUTICASONE PROPIONATE 50 MCG/ACT NA SUSP: 2.0000 | Freq: Every day | NASAL | 11 refills | Status: DC

## 2017-11-26 MED ORDER — LORATADINE 10 MG PO TABS: 10.0000 mg | ORAL_TABLET | Freq: Every day | ORAL | 3 refills | Status: DC | PRN

## 2017-11-26 MED ORDER — DIAZEPAM 5 MG PO TABS: 5.0000 mg | ORAL_TABLET | Freq: Every evening | ORAL | 5 refills | Status: DC | PRN

## 2017-11-26 MED ORDER — AZITHROMYCIN 250 MG PO TABS: ORAL_TABLET | ORAL | 0 refills | Status: DC

## 2017-11-26 NOTE — Patient Instructions (Addendum)
Claritin or Xyzal or Allegra or Zyrtec  Flonase 2 sprays    Cough, Adult Coughing is a reflex that clears your throat and your airways. Coughing helps to heal and protect your lungs. It is normal to cough occasionally, but a cough that happens with other symptoms or lasts a long time may be a sign of a condition that needs treatment. A cough may last only 2-3 weeks (acute), or it may last longer than 8 weeks (chronic). What are the causes? Coughing is commonly caused by:  Breathing in substances that irritate your lungs.  A viral or bacterial respiratory infection.  Allergies.  Asthma.  Postnasal drip.  Smoking.  Acid backing up from the stomach into the esophagus (gastroesophageal reflux).  Certain medicines.  Chronic lung problems, including COPD (or rarely, lung cancer).  Other medical conditions such as heart failure.  Follow these instructions at home: Pay attention to any changes in your symptoms. Take these actions to help with your discomfort:  Take medicines only as told by your health care provider. ? If you were prescribed an antibiotic medicine, take it as told by your health care provider. Do not stop taking the antibiotic even if you start to feel better. ? Talk with your health care provider before you take a cough suppressant medicine.  Drink enough fluid to keep your urine clear or pale yellow.  If the air is dry, use a cold steam vaporizer or humidifier in your bedroom or your home to help loosen secretions.  Avoid anything that causes you to cough at work or at home.  If your cough is worse at night, try sleeping in a semi-upright position.  Avoid cigarette smoke. If you smoke, quit smoking. If you need help quitting, ask your health care provider.  Avoid caffeine.  Avoid alcohol.  Rest as needed.  Contact a health care provider if:  You have new symptoms.  You cough up pus.  Your cough does not get better after 2-3 weeks, or your cough  gets worse.  You cannot control your cough with suppressant medicines and you are losing sleep.  You develop pain that is getting worse or pain that is not controlled with pain medicines.  You have a fever.  You have unexplained weight loss.  You have night sweats. Get help right away if:  You cough up blood.  You have difficulty breathing.  Your heartbeat is very fast. This information is not intended to replace advice given to you by your health care provider. Make sure you discuss any questions you have with your health care provider. Document Released: 07/21/2010 Document Revised: 06/30/2015 Document Reviewed: 03/31/2014 Elsevier Interactive Patient Education  [employer].

## 2017-11-26 NOTE — Progress Notes (Addendum)
Chief Complaint  Patient presents with  . Follow-up   F/u  1. htn elevated on norvasc 5 mg qd  2. Cough with blood and chest pain 2 weeks ago hurt to breath in the bottom of her lungs but resolved. C/o sob no wheezing she also had chest congestion and sinus pressure and drainage tried afrin otc and sudafed otc. Denies fever  3. 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 She also states melatonin not helping with sleep and valium 2 mg prn not helping with sleep   Review of Systems  Constitutional: Negative for fever.  HENT: Positive for sinus pain. Negative for hearing loss.        +post nasal drip   Eyes: Negative for blurred vision.  Respiratory: Positive for cough, hemoptysis and shortness of breath. Negative for wheezing.   Cardiovascular: Negative for chest pain.  Gastrointestinal: Negative for abdominal pain.  Musculoskeletal: Positive for back pain and neck pain.  Skin: Negative for rash.  Neurological: Negative for headaches.  Psychiatric/Behavioral: Positive for depression. The patient is nervous/anxious and has insomnia.    Past Medical History:  Diagnosis Date  . Anxiety   . Breast cancer ([place])    Left breast(nipple mass) 2018  . Cervical stenosis of spine   . Chicken pox   . Chronic back pain   . Colon polyps   . Cyst of left nipple   . Depression   . Hypertension   . Insomnia   . Personal history of radiation therapy    Past Surgical History:  Procedure Laterality Date  . BACK SURGERY     x 3 (2010 x 2; 2013 x 1)   . BREAST BIOPSY Left    core- neg  . BREAST LUMPECTOMY Left   . BREAST SURGERY Left    breast biopsy  . CERVICAL CONE BIOPSY    . CERVICAL FUSION     times 2  . COLONOSCOPY WITH PROPOFOL N/A 04/18/2015   Procedure: COLONOSCOPY WITH PROPOFOL;  Surgeon: [name], MD;  Location: [facility] ENDOSCOPY;  Service:  Endoscopy;  Laterality: N/A;  . ELBOW SURGERY     2014 b/l elbows per pt ulnar nerve   . ELBOW SURGERY     x2   . EPIDURAL BLOCK INJECTION     Dr. Maryjean Ka   . MASS EXCISION Left 04/12/2016   Procedure: EXCISION OF LEFT NIPPLE MASS;  Surgeon: Stark Klein, MD;  Location: [place];  Service: General;  Laterality: Left;  . NECK SURGERY    . POSTERIOR CERVICAL FUSION/FORAMINOTOMY  03/07/2011   Procedure: POSTERIOR CERVICAL FUSION/FORAMINOTOMY LEVEL 4;  Surgeon: Eustace Moore, MD;  Location: [place] NEURO ORS;  Service: Neurosurgery;  Laterality: N/A;  cervical three to thoracic one posterior cervical fusion   . SENTINEL NODE BIOPSY Left 05/29/2016   Procedure: LEFT SENTINEL LYMPH NODE BIOPSY;  Surgeon: Stark Klein, MD;  Location: [place];  Service: General;  Laterality: Left;  . TONSILLECTOMY     Family History  Problem Relation Age of Onset  . Breast cancer Mother 43  . Hypertension Mother   . Cancer Mother        breast dx'ed 79   . Cancer Father        prostate  . Hypertension Father   . Depression Daughter   .  Hypertension Maternal Grandmother   . Breast cancer Cousin    Social History   Socioeconomic History  . Marital status: Single    Spouse name: Not on file  . Number of children: Not on file  . Years of education: Not on file  . Highest education level: Not on file  Occupational History  . Not on file  Social Needs  . Financial resource strain: Not on file  . Food insecurity:    Worry: Not on file    Inability: Not on file  . Transportation needs:    Medical: Not on file    Non-medical: Not on file  Tobacco Use  . Smoking status: Current Every Day Smoker    Packs/day: 1.00    Years: 30.00    Pack years: 30.00    Types: Cigarettes  . Smokeless tobacco: Never Used  Substance and Sexual Activity  . Alcohol use: Yes    Comment: social  . Drug use: No    Comment: on opana since jan 2018  . Sexual activity: Not on file  Lifestyle  . Physical  activity:    Days per week: Not on file    Minutes per session: Not on file  . Stress: Not on file  Relationships  . Social connections:    Talks on phone: Not on file    Gets together: Not on file    Attends religious service: Not on file    Active member of club or organization: Not on file    Attends meetings of clubs or organizations: Not on file    Relationship status: Not on file  . Intimate partner violence:    Fear of current or ex partner: Not on file    Emotionally abused: Not on file    Physically abused: Not on file    Forced sexual activity: Not on file  Other Topics Concern  . Not on file  Social History Narrative   As of 01/25/17 not sexually active of in relationship    Divorced   2 kids son and daughter    [place] has 3 dogs           Current Meds  Medication Sig  . amLODipine (NORVASC) 5 MG tablet Take 1 tablet (5 mg total) by mouth daily. In am  . buPROPion (WELLBUTRIN XL) 300 MG 24 hr tablet Take 1 tablet (300 mg total) by mouth daily.  . Cholecalciferol (D3 ADULT PO) Take by mouth. 10000 IU weekly  . cyanocobalamin 100 MCG tablet Take 100 mcg by mouth daily.  . diazepam (VALIUM) 5 MG tablet Take 1 tablet (5 mg total) by mouth at bedtime as needed for muscle spasms or anxiety.  . gabapentin (NEURONTIN) 600 MG tablet Take 1,200 mg by mouth 3 (three) times daily.  Marland Kitchen letrozole (FEMARA) 2.5 MG tablet Take 1 tablet (2.5 mg total) by mouth daily.  Marland Kitchen oxymorphone (OPANA) 10 MG tablet Take 10 mg by mouth 2 (two) times daily.   . sertraline (ZOLOFT) 100 MG tablet Take 2 tablets (200 mg total) by mouth daily. In am  . [DISCONTINUED] buPROPion (WELLBUTRIN XL) 150 MG 24 hr tablet Take 1 tablet (150 mg total) by mouth daily.  . [DISCONTINUED] diazepam (VALIUM) 2 MG tablet Take 2 mg by mouth at bedtime as needed for muscle spasms or anxiety.  . [DISCONTINUED] sertraline (ZOLOFT) 100 MG tablet Take 1.5 tablets (150 mg total) by mouth daily. In am    Allergies  Allergen Reactions  . Sulfa Antibiotics Itching and Rash  . Sulfonamide Derivatives Itching and Rash   No results found for this or any previous visit (from the past 2160 hour(s)). Objective  Body mass index is 20.05 kg/m. Wt Readings from Last 3 Encounters:  11/26/17 124 lb 3.2 oz (56.3 kg)  09/30/17 122 lb 12.8 oz (55.7 kg)  08/19/17 126 lb 2 oz (57.2 kg)   Temp Readings from Last 3 Encounters:  11/26/17 98.3 F (36.8 C) (Oral)  09/30/17 98.4 F (36.9 C) (Oral)  08/19/17 98.5 F (36.9 C) (Oral)   BP Readings from Last 3 Encounters:  11/26/17 (!) 150/68  09/30/17 126/68  08/19/17 (!) 150/78   Pulse Readings from Last 3 Encounters:  11/26/17 71  09/30/17 70  08/19/17 78    Physical Exam  Constitutional: She is oriented to person, place, and time. Vital signs are normal. She appears well-developed and well-nourished. She is cooperative.  HENT:  Head: Normocephalic and atraumatic.  Nose: Right sinus exhibits maxillary sinus tenderness. Left sinus exhibits maxillary sinus tenderness.  Mouth/Throat: Oropharynx is clear and moist and mucous membranes are normal.  Eyes: Pupils are equal, round, and reactive to light. Conjunctivae are normal.  Cardiovascular: Normal rate, regular rhythm and normal heart sounds.  Pulmonary/Chest: Effort normal and breath sounds normal.  Neurological: She is alert and oriented to person, place, and time. Gait normal.  Skin: Skin is warm, dry and intact.  Psychiatric: She has a normal mood and affect. Her speech is normal and behavior is normal. Judgment and thought content normal. Cognition and memory are normal.  Nursing note and vitals reviewed.   Assessment   1. HTN  2. Cough with pleuritic chest pain and blood, sinusitis  3. Grief with anxiety/depression/insomnia  4. HM Plan   1. norvasc 5 mg qd  2. CXR zpack  otc mucinex dm or robitussin dm  3. wellbutrin increased to 300 mg xl  zoloft 150 increased to 200 mg qd   Note for work  Increase valium 2 mg to 5 mg qhs sleep and MSK issues  4.  Had flu shot today  Tdap UTD  Given info about pna 23 vaccine should do b/c smokerconsider in future rec shingrix vaccine as wellfor future  Pap from [facility] -02/10/15 neg pap mammo 05/01/17 negative h/o breast cancer dx mammo due in 1 yeardiagnostic mammogram -saw surgery [facility] Dr. Barry Dienes 01/21/18 f/u with [name] July 2020 and f/u [facility] in 1 year   dexa 05/01/17 osteopenia  Colonoscopy had 04/18/15 see report Hep C neg 01/20/16 Consider HIV testing in future will disc with pt  rec smoking cessation  Saw Dr. Maryjean Ka 11/05/17 Oval NS in [place] lumbar radiculopathy/DDD he fillled valium for MRI  MRI cervical 10/24/17 C5/6 ACDF partial osseous fusion C5-7 fusion extends C3-T1 unchanged myelomalacia of mid cervical spine worse C4, multilevel mild neural foraminal stenosis of cervical spine mild to moderate right foraminal stenosis T2/3 unchanged.   Provider: Dr. Olivia Mackie McLean-Scoc[facility]

## 2017-11-26 NOTE — Progress Notes (Signed)
Pre visit review using our clinic review tool, if applicable. No additional management support is needed unless otherwise documented below in the visit note. 

## 2017-12-06 ENCOUNTER — Ambulatory Visit: Payer: PPO | Admitting: Internal Medicine

## 2017-12-23 DIAGNOSIS — M961 Postlaminectomy syndrome, not elsewhere classified: Secondary | ICD-10-CM | POA: Diagnosis not present

## 2018-01-21 ENCOUNTER — Encounter: Payer: Self-pay | Admitting: Internal Medicine

## 2018-01-21 DIAGNOSIS — R609 Edema, unspecified: Secondary | ICD-10-CM | POA: Diagnosis not present

## 2018-01-21 DIAGNOSIS — C50012 Malignant neoplasm of nipple and areola, left female breast: Secondary | ICD-10-CM | POA: Diagnosis not present

## 2018-01-21 LAB — HM MAMMOGRAPHY

## 2018-01-22 ENCOUNTER — Ambulatory Visit (INDEPENDENT_AMBULATORY_CARE_PROVIDER_SITE_OTHER): Payer: PPO | Admitting: Internal Medicine

## 2018-01-22 ENCOUNTER — Encounter: Payer: Self-pay | Admitting: Internal Medicine

## 2018-01-22 VITALS — BP 142/60 | HR 77 | Temp 97.8°F | Ht 66.0 in | Wt 127.4 lb

## 2018-01-22 DIAGNOSIS — E559 Vitamin D deficiency, unspecified: Secondary | ICD-10-CM

## 2018-01-22 DIAGNOSIS — Z1329 Encounter for screening for other suspected endocrine disorder: Secondary | ICD-10-CM | POA: Diagnosis not present

## 2018-01-22 DIAGNOSIS — F439 Reaction to severe stress, unspecified: Secondary | ICD-10-CM | POA: Insufficient documentation

## 2018-01-22 DIAGNOSIS — R490 Dysphonia: Secondary | ICD-10-CM

## 2018-01-22 DIAGNOSIS — Z1389 Encounter for screening for other disorder: Secondary | ICD-10-CM

## 2018-01-22 DIAGNOSIS — H6992 Unspecified Eustachian tube disorder, left ear: Secondary | ICD-10-CM | POA: Diagnosis not present

## 2018-01-22 DIAGNOSIS — I10 Essential (primary) hypertension: Secondary | ICD-10-CM | POA: Diagnosis not present

## 2018-01-22 DIAGNOSIS — F339 Major depressive disorder, recurrent, unspecified: Secondary | ICD-10-CM | POA: Diagnosis not present

## 2018-01-22 DIAGNOSIS — F329 Major depressive disorder, single episode, unspecified: Secondary | ICD-10-CM

## 2018-01-22 DIAGNOSIS — J439 Emphysema, unspecified: Secondary | ICD-10-CM

## 2018-01-22 DIAGNOSIS — Z720 Tobacco use: Secondary | ICD-10-CM

## 2018-01-22 DIAGNOSIS — R7303 Prediabetes: Secondary | ICD-10-CM

## 2018-01-22 DIAGNOSIS — F32A Depression, unspecified: Secondary | ICD-10-CM

## 2018-01-22 DIAGNOSIS — G4700 Insomnia, unspecified: Secondary | ICD-10-CM | POA: Diagnosis not present

## 2018-01-22 DIAGNOSIS — F419 Anxiety disorder, unspecified: Secondary | ICD-10-CM

## 2018-01-22 DIAGNOSIS — E785 Hyperlipidemia, unspecified: Secondary | ICD-10-CM | POA: Diagnosis not present

## 2018-01-22 MED ORDER — TELMISARTAN 20 MG PO TABS: 20.0000 mg | ORAL_TABLET | Freq: Every day | ORAL | 3 refills | Status: DC

## 2018-01-22 NOTE — Patient Instructions (Signed)
Stop norvasc 5 mg and start Telmisartan 20 mg daily  F/u in 3 months   Chronic Obstructive Pulmonary Disease  Chronic obstructive pulmonary disease (COPD) is a long-term (chronic) condition that affects the lungs. COPD is a general term that can be used to describe many different lung problems that cause lung swelling (inflammation) and limit airflow, including chronic bronchitis and emphysema. If you have COPD, your lung function will probably never return to normal. In most cases, it gets worse over time. However, there are steps you can take to slow the progression of the disease and improve your quality of life. What are the causes? This condition may be caused by:  Smoking. This is the most common cause.  Certain genes passed down through families. What increases the risk? The following factors may make you more likely to develop this condition:  Secondhand smoke from cigarettes, pipes, or cigars.  Exposure to chemicals and other irritants such as fumes and dust in the work environment.  Chronic lung conditions or infections. What are the signs or symptoms? Symptoms of this condition include:  Shortness of breath, especially during physical activity.  Chronic cough with a large amount of thick mucus. Sometimes the cough may not have any mucus (dry cough).  Wheezing.  Rapid breaths.  Gray or bluish discoloration (cyanosis) of the skin, especially in your fingers, toes, or lips.  Feeling tired (fatigue).  Weight loss.  Chest tightness.  Frequent infections.  Episodes when breathing symptoms become much worse (exacerbations).  Swelling in the ankles, feet, or legs. This may occur in later stages of the disease. How is this diagnosed? This condition is diagnosed based on:  Your medical history.  A physical exam. You may also have tests, including:  Lung (pulmonary) function tests. This may include a spirometry test, which measures your ability to exhale  properly.  Chest X-ray.  CT scan.  Blood tests. How is this treated? This condition may be treated with:  Medicines. These may include inhaled rescue medicines to treat acute exacerbations as well as long-term, or maintenance, medicines to prevent flare-ups of COPD. ? Bronchodilators help treat COPD by dilating the airways to allow increased airflow and make your breathing more comfortable. ? Steroids can reduce airway inflammation and help prevent exacerbations.  Smoking cessation. If you smoke, your health care provider may ask you to quit, and may also recommend therapy or replacement products to help you quit.  Pulmonary rehabilitation. This may involve working with a team of health care providers and specialists, such as respiratory, occupational, and physical therapists.  Exercise and physical activity. These are beneficial for nearly all people with COPD.  Nutrition therapy to gain weight, if you are underweight.  Oxygen. Supplemental oxygen therapy is only helpful if you have a low oxygen level in your blood (hypoxemia).  Lung surgery or transplant.  Palliative care. This is to help people with COPD feel comfortable when treatment is no longer working. Follow these instructions at home: Medicines  Take over-the-counter and prescription medicines (inhaled or pills) only as told by your health care provider.  Talk to your health care provider before taking any cough or allergy medicines. You may need to avoid certain medicines that dry out your airways. Lifestyle  If you are a smoker, the most important thing that you can do is to stop smoking. Do not use any products that contain nicotine or tobacco, such as cigarettes and e-cigarettes. If you need help quitting, ask your health care provider. Continuing  to smoke will cause the disease to progress faster.  Avoid exposure to things that irritate your lungs, such as smoke, chemicals, and fumes.  Stay active, but balance  activity with periods of rest. Exercise and physical activity will help you maintain your ability to do things you want to do.  Learn and use relaxation techniques to manage stress and to control your breathing.  Get the right amount of sleep and get quality sleep. Most adults need 7 or more hours per night.  Eat healthy foods. Eating smaller, more frequent meals and resting before meals may help you maintain your strength. Controlled breathing Learn and use controlled breathing techniques as directed by your health care provider. Controlled breathing techniques include:  Pursed lip breathing. Start by breathing in (inhaling) through your nose for 1 second. Then, purse your lips as if you were going to whistle and breathe out (exhale) through the pursed lips for 2 seconds.  Diaphragmatic breathing. Start by putting one hand on your abdomen just above your waist. Inhale slowly through your nose. The hand on your abdomen should move out. Then purse your lips and exhale slowly. You should be able to feel the hand on your abdomen moving in as you exhale. Controlled coughing Learn and use controlled coughing to clear mucus from your lungs. Controlled coughing is a series of short, progressive coughs. The steps of controlled coughing are: 1. Lean your head slightly forward. 2. Breathe in deeply using diaphragmatic breathing. 3. Try to hold your breath for 3 seconds. 4. Keep your mouth slightly open while coughing twice. 5. Spit any mucus out into a tissue. 6. Rest and repeat the steps once or twice as needed. General instructions  Make sure you receive all the vaccines that your health care provider recommends, especially the pneumococcal and influenza vaccines. Preventing infection and hospitalization is very important when you have COPD.  Use oxygen therapy and pulmonary rehabilitation if directed to by your health care provider. If you require home oxygen therapy, ask your health care provider  whether you should purchase a pulse oximeter to measure your oxygen level at home.  Work with your health care provider to develop a COPD action plan. This will help you know what steps to take if your condition gets worse.  Keep other chronic health conditions under control as told by your health care provider.  Avoid extreme temperature and humidity changes.  Avoid contact with people who have an illness that spreads from person to person (is contagious), such as viral infections or pneumonia.  Keep all follow-up visits as told by your health care provider. This is important. Contact a health care provider if:  You are coughing up more mucus than usual.  There is a change in the color or thickness of your mucus.  Your breathing is more labored than usual.  Your breathing is faster than usual.  You have difficulty sleeping.  You need to use your rescue medicines or inhalers more often than expected.  You have trouble doing routine activities such as getting dressed or walking around the house. Get help right away if:  You have shortness of breath while you are resting.  You have shortness of breath that prevents you from: ? Being able to talk. ? Performing your usual physical activities.  You have chest pain lasting longer than 5 minutes.  Your skin color is more blue (cyanotic) than usual.  You measure low oxygen saturations for longer than 5 minutes with a pulse oximeter.  You have a fever.  You feel too tired to breathe normally. Summary  Chronic obstructive pulmonary disease (COPD) is a long-term (chronic) condition that affects the lungs.  Your lung function will probably never return to normal. In most cases, it gets worse over time. However, there are steps you can take to slow the progression of the disease and improve your quality of life.  Treatment for COPD may include taking medicines, quitting smoking, pulmonary rehabilitation, and changes to diet and  exercise. As the disease progresses, you may need oxygen therapy, a lung transplant, or palliative care.  To help manage your condition, do not smoke, avoid exposure to things that irritate your lungs, stay up to date on all vaccines, and follow your health care provider's instructions for taking medicines. This information is not intended to replace advice given to you by your health care provider. Make sure you discuss any questions you have with your health care provider. Document Released: 11/01/2004 Document Revised: 07/18/2016 Document Reviewed: 02/27/2016 Elsevier Interactive Patient Education  2019 [employer].

## 2018-01-22 NOTE — Progress Notes (Signed)
Pre visit review using our clinic review tool, if applicable. No additional management support is needed unless otherwise documented below in the visit note. 

## 2018-01-22 NOTE — Progress Notes (Signed)
No chief complaint on file.  F/u  1. HTN elevated on norvasc 5 mg qd and pt feels like feet and hands are swelling as shoes and rings feel tighter  2. Still c/o cough CXR 11/26/17 with hyperinflation and c/w mild emphysema she is still smoking. She is using albuterol inhaler w/o much relief 3. Stressors death of daughter and lost job due to hard to go back after daughters death and needed cpr renewal and she did not do it it would be hard due to having to give her daughter CPR at the time of her death and bring back flashbacks. She doesn't know what she will do for employment as of yet. Dealing with daughters death is still hard  4. She still feels like her left ear is clogged using oral AH and flonase w/o relief. No pain   Review of Systems  Constitutional: Negative for weight loss.  HENT: Negative for hearing loss.   Eyes: Negative for blurred vision.  Respiratory: Positive for cough. Negative for shortness of breath.   Cardiovascular: Negative for chest pain.  Gastrointestinal: Negative for abdominal pain.  Musculoskeletal: Negative for falls.  Skin: Negative for rash.  Neurological: Negative for headaches.  Psychiatric/Behavioral: Positive for depression.   Past Medical History:  Diagnosis Date  . Anxiety   . Breast cancer ([place])    Left breast(nipple mass) 2018  . Cervical stenosis of spine   . Chicken pox   . Chronic back pain   . Colon polyps   . Cyst of left nipple   . Depression   . Hypertension   . Insomnia   . Personal history of radiation therapy    Past Surgical History:  Procedure Laterality Date  . BACK SURGERY     x 3 (2010 x 2; 2013 x 1)   . BREAST BIOPSY Left    core- neg  . BREAST LUMPECTOMY Left   . BREAST SURGERY Left    breast biopsy  . CERVICAL CONE BIOPSY    . CERVICAL FUSION     times 2  . COLONOSCOPY WITH PROPOFOL N/A 04/18/2015   Procedure: COLONOSCOPY WITH PROPOFOL;  Surgeon: [name], MD;  Location: [facility] ENDOSCOPY;  Service:  Endoscopy;  Laterality: N/A;  . ELBOW SURGERY     2014 b/l elbows per pt ulnar nerve   . ELBOW SURGERY     x2   . EPIDURAL BLOCK INJECTION     Dr. Maryjean Ka   . MASS EXCISION Left 04/12/2016   Procedure: EXCISION OF LEFT NIPPLE MASS;  Surgeon: Stark Klein, MD;  Location: [facility];  Service: General;  Laterality: Left;  . NECK SURGERY    . POSTERIOR CERVICAL FUSION/FORAMINOTOMY  03/07/2011   Procedure: POSTERIOR CERVICAL FUSION/FORAMINOTOMY LEVEL 4;  Surgeon: Eustace Moore, MD;  Location: [facility] NEURO ORS;  Service: Neurosurgery;  Laterality: N/A;  cervical three to thoracic one posterior cervical fusion   . SENTINEL NODE BIOPSY Left 05/29/2016   Procedure: LEFT SENTINEL LYMPH NODE BIOPSY;  Surgeon: Stark Klein, MD;  Location: [facility];  Service: General;  Laterality: Left;  . TONSILLECTOMY     Family History  Problem Relation Age of Onset  . Breast cancer Mother 33  . Hypertension Mother   . Cancer Mother        breast dx'ed 17   . Cancer Father        prostate  . Hypertension Father   . Depression Daughter   . Hypertension Maternal Grandmother   .  Breast cancer Cousin    Social History   Socioeconomic History  . Marital status: Single    Spouse name: Not on file  . Number of children: Not on file  . Years of education: Not on file  . Highest education level: Not on file  Occupational History  . Not on file  Social Needs  . Financial resource strain: Not on file  . Food insecurity:    Worry: Not on file    Inability: Not on file  . Transportation needs:    Medical: Not on file    Non-medical: Not on file  Tobacco Use  . Smoking status: Current Every Day Smoker    Packs/day: 1.00    Years: 30.00    Pack years: 30.00    Types: Cigarettes  . Smokeless tobacco: Never Used  Substance and Sexual Activity  . Alcohol use: Yes    Comment: social  . Drug use: No    Comment: on opana since jan 2018  . Sexual activity: Not on file  Lifestyle  . Physical  activity:    Days per week: Not on file    Minutes per session: Not on file  . Stress: Not on file  Relationships  . Social connections:    Talks on phone: Not on file    Gets together: Not on file    Attends religious service: Not on file    Active member of club or organization: Not on file    Attends meetings of clubs or organizations: Not on file    Relationship status: Not on file  . Intimate partner violence:    Fear of current or ex partner: Not on file    Emotionally abused: Not on file    Physically abused: Not on file    Forced sexual activity: Not on file  Other Topics Concern  . Not on file  Social History Narrative   As of 01/25/17 not sexually active of in relationship    Divorced   2 kids son and daughter    Disability    [employer] has 3 dogs           Current Meds  Medication Sig  . albuterol (PROVENTIL HFA;VENTOLIN HFA) 108 (90 Base) MCG/ACT inhaler Inhale 1-2 puffs into the lungs every 4 (four) hours as needed for wheezing or shortness of breath.  Marland Kitchen buPROPion (WELLBUTRIN XL) 300 MG 24 hr tablet Take 1 tablet (300 mg total) by mouth daily.  . Cholecalciferol (D3 ADULT PO) Take by mouth. 10000 IU weekly  . cyanocobalamin 100 MCG tablet Take 100 mcg by mouth daily.  . diazepam (VALIUM) 5 MG tablet Take 1 tablet (5 mg total) by mouth at bedtime as needed for muscle spasms or anxiety.  . fluticasone (FLONASE) 50 MCG/ACT nasal spray Place 2 sprays into both nostrils daily. Max 2 sprays  . gabapentin (NEURONTIN) 600 MG tablet Take 1,200 mg by mouth 3 (three) times daily.  Marland Kitchen letrozole (FEMARA) 2.5 MG tablet Take 1 tablet (2.5 mg total) by mouth daily.  Marland Kitchen loratadine (CLARITIN) 10 MG tablet Take 1 tablet (10 mg total) by mouth daily as needed for allergies. At night  . oxymorphone (OPANA) 10 MG tablet Take 10 mg by mouth 2 (two) times daily.   . sertraline (ZOLOFT) 100 MG tablet Take 2 tablets (200 mg total) by mouth daily. In am  . [DISCONTINUED] amLODipine  (NORVASC) 5 MG tablet Take 1 tablet (5 mg total) by mouth daily. In am  . [  DISCONTINUED] azithromycin (ZITHROMAX) 250 MG tablet 2 pills day 1 and 1 pill day 2-5   Allergies  Allergen Reactions  . Sulfa Antibiotics Itching and Rash  . Sulfonamide Derivatives Itching and Rash   Recent Results (from the past 2160 hour(s))  HM MAMMOGRAPHY     Status: None   Collection Time: 01/21/18 12:00 AM  Result Value Ref Range   HM Mammogram 0-4 Bi-Rad 0-4 Bi-Rad, Self Reported Normal    Comment: normal f/u in 1 year    Objective  Body mass index is 20.56 kg/m. Wt Readings from Last 3 Encounters:  01/22/18 127 lb 6.4 oz (57.8 kg)  11/26/17 124 lb 3.2 oz (56.3 kg)  09/30/17 122 lb 12.8 oz (55.7 kg)   Temp Readings from Last 3 Encounters:  01/22/18 97.8 F (36.6 C) (Oral)  11/26/17 98.3 F (36.8 C) (Oral)  09/30/17 98.4 F (36.9 C) (Oral)   BP Readings from Last 3 Encounters:  01/22/18 (!) 146/68  11/26/17 (!) 144/68  09/30/17 126/68   Pulse Readings from Last 3 Encounters:  01/22/18 77  11/26/17 71  09/30/17 70    Physical Exam Vitals signs and nursing note reviewed.  Constitutional:      Appearance: Normal appearance. She is normal weight.  HENT:     Head: Normocephalic and atraumatic.     Mouth/Throat:     Mouth: Mucous membranes are moist.     Pharynx: Oropharynx is clear.  Eyes:     Conjunctiva/sclera: Conjunctivae normal.     Pupils: Pupils are equal, round, and reactive to light.  Cardiovascular:     Rate and Rhythm: Normal rate and regular rhythm.     Heart sounds: Normal heart sounds.  Pulmonary:     Effort: Pulmonary effort is normal.     Breath sounds: Normal breath sounds. No wheezing.  Skin:    General: Skin is warm and dry.  Neurological:     General: No focal deficit present.     Mental Status: She is alert and oriented to person, place, and time. Mental status is at baseline.     Gait: Gait normal.  Psychiatric:        Attention and Perception:  Attention and perception normal.        Mood and Affect: Mood and affect normal.        Speech: Speech normal.        Behavior: Behavior normal. Behavior is cooperative.        Thought Content: Thought content normal.        Cognition and Memory: Cognition and memory normal.        Judgment: Judgment normal.     Assessment   1. HTN  2. Possibly mild COPD noted CXR 11/26/17 still with tobacco abuse  3. Stress loss of job, daughters death with h/o anxiety/depression/insomnia stable  4. Likely left eustachian tube dysfunction, hoarseness  5. HM Plan   1.d/c norvasc 5 mg ? If causing peripheral edema  Start telmisartan 20 mg qd  2. Given sample of anoro #30 puffs 06/2018 exp lot [id number] x 1  Call back if likes and will refill  rec smoking cessation  3. Monitor consider therapy in future  Cont meds  4. Trial of sudafed x <3 days if needed with flonase and AH  If hoarseness or sxs continue rec ENT consult  5.  Flu shot utd  Tdap UTD  Given info about pna 23 vaccine should do b/c smokerconsider in future rec  shingrix vaccine as wellfor future  Pap from [facility] -02/10/15 neg papwill do at f/u  mammo 05/01/17 negative h/o breast cancer dx mammo due in 1 yeardiagnostic mammogram -saw surgery [facility] Dr. Barry Dienes 01/21/18 f/u with [name] July 2020 and f/u [facility] in 1 year   dexa 05/01/17 osteopenia  Colonoscopy had 04/18/15 see report Hep C neg 01/20/16 Consider HIV testing in future will disc with pt  rec smoking cessation  Saw Dr. Maryjean Ka 11/05/17 [place] [facility] in [place] lumbar radiculopathy/DDD he fillled valium for MRI  MRI cervical 10/24/17 C5/6 ACDF partial osseous fusion C5-7 fusion extends C3-T1 unchanged myelomalacia of mid cervical spine worse C4, multilevel mild neural foraminal stenosis of cervical spine mild to moderate right foraminal stenosis T2/3 unchanged.  Provider: Dr. Olivia Mackie McLean-Scocuzza-[facility]

## 2018-02-13 ENCOUNTER — Other Ambulatory Visit (INDEPENDENT_AMBULATORY_CARE_PROVIDER_SITE_OTHER): Payer: PPO

## 2018-02-13 DIAGNOSIS — I10 Essential (primary) hypertension: Secondary | ICD-10-CM

## 2018-02-13 DIAGNOSIS — E785 Hyperlipidemia, unspecified: Secondary | ICD-10-CM | POA: Diagnosis not present

## 2018-02-13 DIAGNOSIS — Z1329 Encounter for screening for other suspected endocrine disorder: Secondary | ICD-10-CM

## 2018-02-13 DIAGNOSIS — R7303 Prediabetes: Secondary | ICD-10-CM

## 2018-02-13 DIAGNOSIS — E559 Vitamin D deficiency, unspecified: Secondary | ICD-10-CM | POA: Diagnosis not present

## 2018-02-13 DIAGNOSIS — Z1389 Encounter for screening for other disorder: Secondary | ICD-10-CM

## 2018-02-13 LAB — COMPREHENSIVE METABOLIC PANEL
ALT: 20 U/L (ref 0–35)
AST: 21 U/L (ref 0–37)
Albumin: 3.8 g/dL (ref 3.5–5.2)
Alkaline Phosphatase: 115 U/L (ref 39–117)
BILIRUBIN TOTAL: 0.2 mg/dL (ref 0.2–1.2)
BUN: 6 mg/dL (ref 6–23)
CO2: 28 meq/L (ref 19–32)
CREATININE: 0.52 mg/dL (ref 0.40–1.20)
Calcium: 9.6 mg/dL (ref 8.4–10.5)
Chloride: 100 mEq/L (ref 96–112)
GFR: 127.13 mL/min (ref >60.00)
Glucose, Bld: 104 mg/dL — ABNORMAL HIGH (ref 70–99)
Potassium: 5 mEq/L (ref 3.5–5.1)
Sodium: 136 mEq/L (ref 135–145)
Total Protein: 7.3 g/dL (ref 6.0–8.3)

## 2018-02-13 LAB — LIPID PANEL
CHOL/HDL RATIO: 2
Cholesterol: 178 mg/dL (ref 0–200)
HDL: 80.6 mg/dL (ref >39.00)
LDL Cholesterol: 81 mg/dL (ref 0–99)
NonHDL: 97.8
TRIGLYCERIDES: 86 mg/dL (ref 0.0–149.0)
VLDL: 17.2 mg/dL (ref 0.0–40.0)

## 2018-02-13 LAB — CBC WITH DIFFERENTIAL/PLATELET
Basophils Absolute: 0.1 10*3/uL (ref 0.0–0.1)
Basophils Relative: 1.8 % (ref 0.0–3.0)
Eosinophils Absolute: 0.1 10*3/uL (ref 0.0–0.7)
Eosinophils Relative: 2 % (ref 0.0–5.0)
HCT: 41.6 % (ref 36.0–46.0)
Hemoglobin: 13.7 g/dL (ref 12.0–15.0)
Lymphocytes Relative: 13.8 % (ref 12.0–46.0)
Lymphs Abs: 1 10*3/uL (ref 0.7–4.0)
MCHC: 33 g/dL (ref 30.0–36.0)
MCV: 95 fl (ref 78.0–100.0)
MONOS PCT: 6.9 % (ref 3.0–12.0)
Monocytes Absolute: 0.5 10*3/uL (ref 0.1–1.0)
NEUTROS ABS: 5.7 10*3/uL (ref 1.4–7.7)
Neutrophils Relative %: 75.5 % (ref 43.0–77.0)
Platelets: 511 10*3/uL — ABNORMAL HIGH (ref 150.0–400.0)
RBC: 4.38 Mil/uL (ref 3.87–5.11)
RDW: 14.4 % (ref 11.5–15.5)
WBC: 7.5 10*3/uL (ref 4.0–10.5)

## 2018-02-13 LAB — HEMOGLOBIN A1C: Hgb A1c MFr Bld: 6.1 % (ref 4.6–6.5)

## 2018-02-13 LAB — TSH: TSH: 2.97 u[IU]/mL (ref 0.35–4.50)

## 2018-02-13 LAB — VITAMIN D 25 HYDROXY (VIT D DEFICIENCY, FRACTURES): VITD: 24.95 ng/mL — ABNORMAL LOW (ref 30.00–100.00)

## 2018-02-13 NOTE — Addendum Note (Signed)
Addended by: Leeanne Rio on: 02/13/2018 09:50 AM   Modules accepted: Orders

## 2018-02-13 NOTE — Addendum Note (Signed)
Addended by: Arby Barrette on: 02/13/2018 09:55 AM   Modules accepted: Orders
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
## 2018-03-19 DIAGNOSIS — H524 Presbyopia: Secondary | ICD-10-CM | POA: Diagnosis not present

## 2018-03-19 DIAGNOSIS — H5213 Myopia, bilateral: Secondary | ICD-10-CM | POA: Diagnosis not present

## 2018-03-19 DIAGNOSIS — H2513 Age-related nuclear cataract, bilateral: Secondary | ICD-10-CM | POA: Diagnosis not present

## 2018-03-25 ENCOUNTER — Ambulatory Visit: Payer: PPO | Admitting: Internal Medicine

## 2018-03-25 DIAGNOSIS — M961 Postlaminectomy syndrome, not elsewhere classified: Secondary | ICD-10-CM | POA: Diagnosis not present

## 2018-03-26 ENCOUNTER — Encounter: Payer: Self-pay | Admitting: Internal Medicine

## 2018-03-26 ENCOUNTER — Ambulatory Visit (INDEPENDENT_AMBULATORY_CARE_PROVIDER_SITE_OTHER): Payer: PPO | Admitting: Internal Medicine

## 2018-03-26 VITALS — BP 144/72 | HR 89 | Temp 98.3°F | Ht 66.0 in | Wt 125.6 lb

## 2018-03-26 DIAGNOSIS — M8580 Other specified disorders of bone density and structure, unspecified site: Secondary | ICD-10-CM

## 2018-03-26 DIAGNOSIS — R0982 Postnasal drip: Secondary | ICD-10-CM

## 2018-03-26 DIAGNOSIS — J449 Chronic obstructive pulmonary disease, unspecified: Secondary | ICD-10-CM | POA: Diagnosis not present

## 2018-03-26 DIAGNOSIS — Z853 Personal history of malignant neoplasm of breast: Secondary | ICD-10-CM | POA: Diagnosis not present

## 2018-03-26 DIAGNOSIS — Z23 Encounter for immunization: Secondary | ICD-10-CM | POA: Diagnosis not present

## 2018-03-26 DIAGNOSIS — F419 Anxiety disorder, unspecified: Secondary | ICD-10-CM

## 2018-03-26 DIAGNOSIS — F4321 Adjustment disorder with depressed mood: Secondary | ICD-10-CM | POA: Diagnosis not present

## 2018-03-26 DIAGNOSIS — J309 Allergic rhinitis, unspecified: Secondary | ICD-10-CM

## 2018-03-26 DIAGNOSIS — I10 Essential (primary) hypertension: Secondary | ICD-10-CM | POA: Diagnosis not present

## 2018-03-26 DIAGNOSIS — F4310 Post-traumatic stress disorder, unspecified: Secondary | ICD-10-CM

## 2018-03-26 DIAGNOSIS — Z720 Tobacco use: Secondary | ICD-10-CM | POA: Diagnosis not present

## 2018-03-26 DIAGNOSIS — F32A Depression, unspecified: Secondary | ICD-10-CM

## 2018-03-26 DIAGNOSIS — J439 Emphysema, unspecified: Secondary | ICD-10-CM

## 2018-03-26 DIAGNOSIS — F329 Major depressive disorder, single episode, unspecified: Secondary | ICD-10-CM

## 2018-03-26 MED ORDER — UMECLIDINIUM-VILANTEROL 62.5-25 MCG/INH IN AEPB: 1.0000 | INHALATION_SPRAY | Freq: Every day | RESPIRATORY_TRACT | 12 refills | Status: DC

## 2018-03-26 MED ORDER — IPRATROPIUM BROMIDE 0.06 % NA SOLN: 2.0000 | Freq: Three times a day (TID) | NASAL | 12 refills | Status: DC

## 2018-03-26 MED ORDER — TELMISARTAN 40 MG PO TABS: 40.0000 mg | ORAL_TABLET | Freq: Every day | ORAL | 3 refills | Status: DC

## 2018-03-26 MED ORDER — HYDROCHLOROTHIAZIDE 12.5 MG PO TABS: 12.5000 mg | ORAL_TABLET | Freq: Every day | ORAL | 0 refills | Status: DC

## 2018-03-26 NOTE — Patient Instructions (Addendum)
3-4 months come for am appt fasting and we will do pap   Vitamin D3 5000 IU daily   Coping with Quitting Smoking  Quitting smoking is a physical and mental challenge. You will face cravings, withdrawal symptoms, and temptation. Before quitting, work with your health care provider to make a plan that can help you cope. Preparation can help you quit and keep you from giving in. How can I cope with cravings? Cravings usually last for 5-10 minutes. If you get through it, the craving will pass. Consider taking the following actions to help you cope with cravings:  Keep your mouth busy: ? Chew sugar-free gum. ? Suck on hard candies or a straw. ? Brush your teeth.  Keep your hands and body busy: ? Immediately change to a different activity when you feel a craving. ? Squeeze or play with a ball. ? Do an activity or a hobby, like making bead jewelry, practicing needlepoint, or working with wood. ? Mix up your normal routine. ? Take a short exercise break. Go for a quick walk or run up and down stairs. ? Spend time in public places where smoking is not allowed.  Focus on doing something kind or helpful for someone else.  Call a friend or family member to talk during a craving.  Join a support group.  Call a quit line, such as 1-800-QUIT-NOW.  Talk with your health care provider about medicines that might help you cope with cravings and make quitting easier for you. How can I deal with withdrawal symptoms? Your body may experience negative effects as it tries to get used to not having nicotine in the system. These effects are called withdrawal symptoms. They may include:  Feeling hungrier than normal.  Trouble concentrating.  Irritability.  Trouble sleeping.  Feeling depressed.  Restlessness and agitation.  Craving a cigarette. To manage withdrawal symptoms:  Avoid places, people, and activities that trigger your cravings.  Remember why you want to quit.  Get plenty of  sleep.  Avoid coffee and other caffeinated drinks. These may worsen some of your symptoms. How can I handle social situations? Social situations can be difficult when you are quitting smoking, especially in the first few weeks. To manage this, you can:  Avoid parties, bars, and other social situations where people might be smoking.  Avoid alcohol.  Leave right away if you have the urge to smoke.  Explain to your family and friends that you are quitting smoking. Ask for understanding and support.  Plan activities with friends or family where smoking is not an option. What are some ways I can cope with stress? Wanting to smoke may cause stress, and stress can make you want to smoke. Find ways to manage your stress. Relaxation techniques can help. For example:  Breathe slowly and deeply, in through your nose and out through your mouth.  Listen to soothing, relaxing music.  Talk with a family member or friend about your stress.  Light a candle.  Soak in a bath or take a shower.  Think about a peaceful place. What are some ways I can prevent weight gain? Be aware that many people gain weight after they quit smoking. However, not everyone does. To keep from gaining weight, have a plan in place before you quit and stick to the plan after you quit. Your plan should include:  Having healthy snacks. When you have a craving, it may help to: ? Eat plain popcorn, crunchy carrots, celery, or other cut vegetables. ?  Chew sugar-free gum.  Changing how you eat: ? Eat small portion sizes at meals. ? Eat 4-6 small meals throughout the day instead of 1-2 large meals a day. ? Be mindful when you eat. Do not watch television or do other things that might distract you as you eat.  Exercising regularly: ? Make time to exercise each day. If you do not have time for a long workout, do short bouts of exercise for 5-10 minutes several times a day. ? Do some form of strengthening exercise, like weight  lifting, and some form of aerobic exercise, like running or swimming.  Drinking plenty of water or other low-calorie or no-calorie drinks. Drink 6-8 glasses of water daily, or as much as instructed by your health care provider. Summary  Quitting smoking is a physical and mental challenge. You will face cravings, withdrawal symptoms, and temptation to smoke again. Preparation can help you as you go through these challenges.  You can cope with cravings by keeping your mouth busy (such as by chewing gum), keeping your body and hands busy, and making calls to family, friends, or a helpline for people who want to quit smoking.  You can cope with withdrawal symptoms by avoiding places where people smoke, avoiding drinks with caffeine, and getting plenty of rest.  Ask your health care provider about the different ways to prevent weight gain, avoid stress, and handle social situations. This information is not intended to replace advice given to you by your health care provider. Make sure you discuss any questions you have with your health care provider. Document Released: 01/20/2016 Document Revised: 01/20/2016 Document Reviewed: 01/20/2016 Elsevier Interactive Patient Education  2019 [employer].

## 2018-03-26 NOTE — Progress Notes (Signed)
Chief Complaint  Patient presents with  . Follow-up   F/u  1. HTN uncontrolled on telmisartan 20 mg qd yesterday sbp was 160 tried norvasc 5 mg in the past had hand and leg swelling. She has allergy to topical sulfa but is agreeable to try hctz instead of re-adding norvasc today  2. Anxiety/depression/grief ptsd on zoloft and wellbutrin not helping pt is tearful still about daughters death and unable to leave her hours  3. CXR 11/2017 +COPD still smoking anoro helped c/w cost  4. PND + on flonase and prn Afrin helps some and claritin    Review of Systems  Constitutional: Negative for weight loss.  HENT: Negative for hearing loss.        +PND   Eyes: Negative for blurred vision.  Respiratory: Positive for cough.   Cardiovascular: Negative for chest pain.  Gastrointestinal: Negative for abdominal pain.  Skin: Negative for rash.  Neurological: Negative for headaches.  Psychiatric/Behavioral: Positive for depression. Negative for suicidal ideas. The patient is nervous/anxious.        +PTSD    Past Medical History:  Diagnosis Date  . Anxiety   . Breast cancer ([place])    Left breast(nipple mass) 2018  . Cervical stenosis of spine   . Chicken pox   . Chronic back pain   . Colon polyps   . COPD (chronic obstructive pulmonary disease) ([place])   . Cyst of left nipple   . Depression   . Hypertension   . Insomnia   . Personal history of radiation therapy    Past Surgical History:  Procedure Laterality Date  . BACK SURGERY     x 3 (2010 x 2; 2013 x 1)   . BREAST BIOPSY Left    core- neg  . BREAST LUMPECTOMY Left   . BREAST SURGERY Left    breast biopsy  . CERVICAL CONE BIOPSY    . CERVICAL FUSION     times 2  . COLONOSCOPY WITH PROPOFOL N/A 04/18/2015   Procedure: COLONOSCOPY WITH PROPOFOL;  Surgeon: [name], MD;  Location: [facility] ENDOSCOPY;  Service: Endoscopy;  Laterality: N/A;  . ELBOW SURGERY     2014 b/l elbows per pt ulnar nerve   . ELBOW SURGERY     x2   .  EPIDURAL BLOCK INJECTION     Dr. Maryjean Ka   . MASS EXCISION Left 04/12/2016   Procedure: EXCISION OF LEFT NIPPLE MASS;  Surgeon: Stark Klein, MD;  Location: [place];  Service: General;  Laterality: Left;  . NECK SURGERY    . POSTERIOR CERVICAL FUSION/FORAMINOTOMY  03/07/2011   Procedure: POSTERIOR CERVICAL FUSION/FORAMINOTOMY LEVEL 4;  Surgeon: Eustace Moore, MD;  Location: [place] NEURO ORS;  Service: Neurosurgery;  Laterality: N/A;  cervical three to thoracic one posterior cervical fusion   . SENTINEL NODE BIOPSY Left 05/29/2016   Procedure: LEFT SENTINEL LYMPH NODE BIOPSY;  Surgeon: Stark Klein, MD;  Location: [place];  Service: General;  Laterality: Left;  . TONSILLECTOMY     Family History  Problem Relation Age of Onset  . Breast cancer Mother 33  . Hypertension Mother   . Cancer Mother        breast dx'ed 91   . Cancer Father        prostate  . Hypertension Father   . Depression Daughter   . Hypertension Maternal Grandmother   . Breast cancer Cousin    Social History   Socioeconomic History  . Marital status:  Single    Spouse name: Not on file  . Number of children: Not on file  . Years of education: Not on file  . Highest education level: Not on file  Occupational History  . Not on file  Social Needs  . Financial resource strain: Not on file  . Food insecurity:    Worry: Not on file    Inability: Not on file  . Transportation needs:    Medical: Not on file    Non-medical: Not on file  Tobacco Use  . Smoking status: Current Every Day Smoker    Packs/day: 1.00    Years: 30.00    Pack years: 30.00    Types: Cigarettes  . Smokeless tobacco: Never Used  Substance and Sexual Activity  . Alcohol use: Yes    Comment: social  . Drug use: No    Comment: on opana since jan 2018  . Sexual activity: Not on file  Lifestyle  . Physical activity:    Days per week: Not on file    Minutes per session: Not on file  . Stress: Not on file  Relationships  .  Social connections:    Talks on phone: Not on file    Gets together: Not on file    Attends religious service: Not on file    Active member of club or organization: Not on file    Attends meetings of clubs or organizations: Not on file    Relationship status: Not on file  . Intimate partner violence:    Fear of current or ex partner: Not on file    Emotionally abused: Not on file    Physically abused: Not on file    Forced sexual activity: Not on file  Other Topics Concern  . Not on file  Social History Narrative   As of 01/25/17 not sexually active of in relationship    Divorced   2 kids son and daughter (though daughter died of suicide in 2017/05/02)   Disability    Loves animals has 3 dogs           Current Meds  Medication Sig  . albuterol (PROVENTIL HFA;VENTOLIN HFA) 108 (90 Base) MCG/ACT inhaler Inhale 1-2 puffs into the lungs every 4 (four) hours as needed for wheezing or shortness of breath.  Marland Kitchen buPROPion (WELLBUTRIN XL) 300 MG 24 hr tablet Take 1 tablet (300 mg total) by mouth daily.  . Cholecalciferol (D3 ADULT PO) Take by mouth. 10000 IU weekly  . cyanocobalamin 100 MCG tablet Take 100 mcg by mouth daily.  . diazepam (VALIUM) 5 MG tablet Take 1 tablet (5 mg total) by mouth at bedtime as needed for muscle spasms or anxiety.  . fluticasone (FLONASE) 50 MCG/ACT nasal spray Place 2 sprays into both nostrils daily. Max 2 sprays  . gabapentin (NEURONTIN) 600 MG tablet Take 1,200 mg by mouth 3 (three) times daily.  Marland Kitchen letrozole (FEMARA) 2.5 MG tablet Take 1 tablet (2.5 mg total) by mouth daily.  Marland Kitchen loratadine (CLARITIN) 10 MG tablet Take 1 tablet (10 mg total) by mouth daily as needed for allergies. At night  . oxymorphone (OPANA) 10 MG tablet Take 10 mg by mouth 2 (two) times daily.   . sertraline (ZOLOFT) 100 MG tablet Take 2 tablets (200 mg total) by mouth daily. In am  . telmisartan (MICARDIS) 20 MG tablet Take 1 tablet (20 mg total) by mouth daily.   Allergies  Allergen  Reactions  . Sulfa Antibiotics Itching and Rash  .  Sulfonamide Derivatives Itching and Rash   Recent Results (from the past 2160 hour(s))  HM MAMMOGRAPHY     Status: None   Collection Time: 01/21/18 12:00 AM  Result Value Ref Range   HM Mammogram 0-4 Bi-Rad 0-4 Bi-Rad, Self Reported Normal    Comment: normal f/u in 1 year   Hemoglobin A1c     Status: None   Collection Time: 02/13/18  9:51 AM  Result Value Ref Range   Hgb A1c MFr Bld 6.1 4.6 - 6.5 %    Comment: Glycemic Control Guidelines for People with Diabetes:Non Diabetic:  <6%Goal of Therapy: <7%Additional Action Suggested:  >8%   Vitamin D (25 hydroxy)     Status: Abnormal   Collection Time: 02/13/18  9:51 AM  Result Value Ref Range   VITD 24.95 (L) 30.00 - 100.00 ng/mL  TSH     Status: None   Collection Time: 02/13/18  9:51 AM  Result Value Ref Range   TSH 2.97 0.35 - 4.50 uIU/mL  Lipid panel     Status: None   Collection Time: 02/13/18  9:51 AM  Result Value Ref Range   Cholesterol 178 0 - 200 mg/dL    Comment: ATP III Classification       Desirable:  < 200 mg/dL               Borderline High:  200 - 239 mg/dL          High:  > = 240 mg/dL   Triglycerides 86.0 0.0 - 149.0 mg/dL    Comment: Normal:  <150 mg/dLBorderline High:  150 - 199 mg/dL   HDL 80.60 >39.00 mg/dL   VLDL 17.2 0.0 - 40.0 mg/dL   LDL Cholesterol 81 0 - 99 mg/dL   Total CHOL/HDL Ratio 2     Comment:                Men          Women1/2 Average Risk     3.4          3.3Average Risk          5.0          4.42X Average Risk          9.6          7.13X Average Risk          15.0          11.0                       NonHDL 97.80     Comment: NOTE:  Non-HDL goal should be 30 mg/dL higher than patient's LDL goal (i.e. LDL goal of < 70 mg/dL, would have non-HDL goal of < 100 mg/dL)  CBC with Differential/Platelet     Status: Abnormal   Collection Time: 02/13/18  9:51 AM  Result Value Ref Range   WBC 7.5 4.0 - 10.5 K/uL   RBC 4.38 3.87 - 5.11 Mil/uL   Hemoglobin  13.7 12.0 - 15.0 g/dL   HCT 41.6 36.0 - 46.0 %   MCV 95.0 78.0 - 100.0 fl   MCHC 33.0 30.0 - 36.0 g/dL   RDW 14.4 11.5 - 15.5 %   Platelets 511.0 (H) 150.0 - 400.0 K/uL   Neutrophils Relative % 75.5 43.0 - 77.0 %   Lymphocytes Relative 13.8 12.0 - 46.0 %   Monocytes Relative 6.9 3.0 - 12.0 %   Eosinophils Relative 2.0  0.0 - 5.0 %   Basophils Relative 1.8 0.0 - 3.0 %   Neutro Abs 5.7 1.4 - 7.7 K/uL   Lymphs Abs 1.0 0.7 - 4.0 K/uL   Monocytes Absolute 0.5 0.1 - 1.0 K/uL   Eosinophils Absolute 0.1 0.0 - 0.7 K/uL   Basophils Absolute 0.1 0.0 - 0.1 K/uL  Comprehensive metabolic panel     Status: Abnormal   Collection Time: 02/13/18  9:51 AM  Result Value Ref Range   Sodium 136 135 - 145 mEq/L   Potassium 5.0 3.5 - 5.1 mEq/L   Chloride 100 96 - 112 mEq/L   CO2 28 19 - 32 mEq/L   Glucose, Bld 104 (H) 70 - 99 mg/dL   BUN 6 6 - 23 mg/dL   Creatinine, Ser 0.52 0.40 - 1.20 mg/dL   Total Bilirubin 0.2 0.2 - 1.2 mg/dL   Alkaline Phosphatase 115 39 - 117 U/L   AST 21 0 - 37 U/L   ALT 20 0 - 35 U/L   Total Protein 7.3 6.0 - 8.3 g/dL   Albumin 3.8 3.5 - 5.2 g/dL   Calcium 9.6 8.4 - 10.5 mg/dL   GFR 127.13 >60.00 mL/min   Objective  Body mass index is 20.27 kg/m. Wt Readings from Last 3 Encounters:  03/26/18 125 lb 9.6 oz (57 kg)  01/22/18 127 lb 6.4 oz (57.8 kg)  11/26/17 124 lb 3.2 oz (56.3 kg)   Temp Readings from Last 3 Encounters:  03/26/18 98.3 F (36.8 C) (Oral)  01/22/18 97.8 F (36.6 C) (Oral)  11/26/17 98.3 F (36.8 C) (Oral)   BP Readings from Last 3 Encounters:  03/26/18 (!) 144/72  01/22/18 (!) 142/60  11/26/17 (!) 144/68   Pulse Readings from Last 3 Encounters:  03/26/18 89  01/22/18 77  11/26/17 71    Physical Exam Vitals signs and nursing note reviewed.  Constitutional:      Appearance: Normal appearance. She is well-developed and well-groomed.  HENT:     Head: Normocephalic and atraumatic.     Nose: Nose normal.     Mouth/Throat:     Mouth: Mucous  membranes are moist.     Pharynx: Oropharynx is clear.  Eyes:     Conjunctiva/sclera: Conjunctivae normal.     Pupils: Pupils are equal, round, and reactive to light.  Cardiovascular:     Rate and Rhythm: Normal rate and regular rhythm.     Heart sounds: Normal heart sounds. No murmur.  Pulmonary:     Effort: Pulmonary effort is normal.     Breath sounds: Normal breath sounds.  Skin:    General: Skin is warm and dry.  Neurological:     General: No focal deficit present.     Mental Status: She is alert and oriented to person, place, and time. Mental status is at baseline.     Gait: Gait normal.  Psychiatric:        Attention and Perception: Attention and perception normal.        Mood and Affect: Mood and affect normal.        Speech: Speech normal.        Behavior: Behavior normal. Behavior is cooperative.        Thought Content: Thought content normal.        Cognition and Memory: Cognition and memory normal.        Judgment: Judgment normal.     Assessment   1. HTN uncontrolled  2. Anxiety/depression/grief/ptsd 3. COPD with chronic  cough likely due to tobacco abuse  4. Allergic rhinitis and PND 5. HM Plan  1. Increase telmisartan to 40 mg qd add hctz 12.5 mg qd  Hold re-adding norvasc for now if needed in future consider 2.5 dose  2. Refer to [facility] current meds  Pt likely needs psych and therapy  3. Given sampes of anoro x 2 and coupon rec smoking cessation  4. Add atrovent nasal cont flonase and claritin  5.  Labs at f/u Flu shot utd  Tdap UTD  pna 23 given today  rec shingrix vaccine as wellfor future  Pap from [facility] -02/10/15 neg papwill do at f/u with labs   mammo 05/01/17 negative h/o breast cancer dx mammo due in 1 yeardiagnostic mammogram -saw surgery [facility] Dr. Barry Dienes 01/21/18 f/u with [name] July 2020 and f/u [facility] in 1 year -referred mammo dx today   dexa 05/01/17 osteopenia rec calcium and vitamin D  Colonoscopy had 04/18/15 see  reporttubular adenomas repeat in 5 years Hep C neg 01/20/16 Consider HIV testing in future will disc with pt  rec smoking cessation  Saw Dr. Maryjean Ka 11/05/17 [place] in [place] lumbar radiculopathy/DDD he fillled valium for MRI  MRI cervical 10/24/17 C5/6 ACDF partial osseous fusion C5-7 fusion extends C3-T1 unchanged myelomalacia of mid cervical spine worse C4, multilevel mild neural foraminal stenosis of cervical spine mild to moderate right foraminal stenosis T2/3 unchanged.  Provider: Dr. Olivia Mackie McLean-Scocuzza-Internal Medicine

## 2018-03-26 NOTE — Progress Notes (Signed)
Pre visit review using our clinic review tool, if applicable. No additional management support is needed unless otherwise documented below in the visit note. 

## 2018-03-27 DIAGNOSIS — R0982 Postnasal drip: Secondary | ICD-10-CM | POA: Insufficient documentation

## 2018-04-27 ENCOUNTER — Other Ambulatory Visit: Payer: Self-pay | Admitting: Internal Medicine

## 2018-04-27 DIAGNOSIS — I10 Essential (primary) hypertension: Secondary | ICD-10-CM

## 2018-04-27 MED ORDER — HYDROCHLOROTHIAZIDE 12.5 MG PO TABS: 12.5000 mg | ORAL_TABLET | Freq: Every day | ORAL | 3 refills | Status: DC

## 2018-05-07 ENCOUNTER — Inpatient Hospital Stay: Admission: RE | Admit: 2018-05-07 | Payer: PPO | Source: Ambulatory Visit

## 2018-06-06 ENCOUNTER — Ambulatory Visit
Admission: RE | Admit: 2018-06-06 | Discharge: 2018-06-06 | Disposition: A | Discharge status: Home / Self Care | Payer: PPO | Source: Ambulatory Visit | Attending: Internal Medicine | Admitting: Internal Medicine

## 2018-06-06 ENCOUNTER — Other Ambulatory Visit: Payer: Self-pay

## 2018-06-06 DIAGNOSIS — Z853 Personal history of malignant neoplasm of breast: Secondary | ICD-10-CM

## 2018-06-24 ENCOUNTER — Other Ambulatory Visit: Payer: Self-pay | Admitting: Internal Medicine

## 2018-06-24 DIAGNOSIS — F419 Anxiety disorder, unspecified: Secondary | ICD-10-CM

## 2018-06-24 DIAGNOSIS — G4700 Insomnia, unspecified: Secondary | ICD-10-CM

## 2018-06-24 MED ORDER — DIAZEPAM 5 MG PO TABS: 5.0000 mg | ORAL_TABLET | Freq: Every evening | ORAL | 5 refills | Status: DC | PRN

## 2018-06-27 ENCOUNTER — Ambulatory Visit: Payer: PPO | Admitting: Internal Medicine

## 2018-07-16 ENCOUNTER — Other Ambulatory Visit: Payer: Self-pay

## 2018-07-16 ENCOUNTER — Ambulatory Visit: Payer: PPO | Admitting: Licensed Clinical Social Worker

## 2018-07-16 DIAGNOSIS — M961 Postlaminectomy syndrome, not elsewhere classified: Secondary | ICD-10-CM | POA: Diagnosis not present

## 2018-07-21 DIAGNOSIS — M542 Cervicalgia: Secondary | ICD-10-CM | POA: Diagnosis not present

## 2018-07-29 ENCOUNTER — Ambulatory Visit (INDEPENDENT_AMBULATORY_CARE_PROVIDER_SITE_OTHER): Payer: PPO | Admitting: Internal Medicine

## 2018-07-29 ENCOUNTER — Other Ambulatory Visit: Payer: Self-pay

## 2018-07-29 DIAGNOSIS — E538 Deficiency of other specified B group vitamins: Secondary | ICD-10-CM | POA: Diagnosis not present

## 2018-07-29 DIAGNOSIS — F329 Major depressive disorder, single episode, unspecified: Secondary | ICD-10-CM

## 2018-07-29 DIAGNOSIS — E559 Vitamin D deficiency, unspecified: Secondary | ICD-10-CM

## 2018-07-29 DIAGNOSIS — R5383 Other fatigue: Secondary | ICD-10-CM | POA: Diagnosis not present

## 2018-07-29 DIAGNOSIS — Z853 Personal history of malignant neoplasm of breast: Secondary | ICD-10-CM | POA: Diagnosis not present

## 2018-07-29 DIAGNOSIS — J309 Allergic rhinitis, unspecified: Secondary | ICD-10-CM | POA: Diagnosis not present

## 2018-07-29 DIAGNOSIS — I10 Essential (primary) hypertension: Secondary | ICD-10-CM

## 2018-07-29 DIAGNOSIS — F32A Depression, unspecified: Secondary | ICD-10-CM

## 2018-07-29 DIAGNOSIS — R490 Dysphonia: Secondary | ICD-10-CM | POA: Diagnosis not present

## 2018-07-29 DIAGNOSIS — K5900 Constipation, unspecified: Secondary | ICD-10-CM | POA: Diagnosis not present

## 2018-07-29 DIAGNOSIS — D75839 Thrombocytosis, unspecified: Secondary | ICD-10-CM

## 2018-07-29 DIAGNOSIS — D473 Essential (hemorrhagic) thrombocythemia: Secondary | ICD-10-CM

## 2018-07-29 DIAGNOSIS — J439 Emphysema, unspecified: Secondary | ICD-10-CM | POA: Diagnosis not present

## 2018-07-29 DIAGNOSIS — F419 Anxiety disorder, unspecified: Secondary | ICD-10-CM | POA: Diagnosis not present

## 2018-07-29 DIAGNOSIS — R7303 Prediabetes: Secondary | ICD-10-CM | POA: Diagnosis not present

## 2018-07-29 MED ORDER — SENNOSIDES-DOCUSATE SODIUM 8.6-50 MG PO TABS: 1.0000 | ORAL_TABLET | Freq: Every day | ORAL | 3 refills | Status: DC | PRN

## 2018-07-29 MED ORDER — THERA VITAL M PO TABS: 1.0000 | ORAL_TABLET | Freq: Every day | ORAL | 3 refills | Status: DC

## 2018-07-29 MED ORDER — MONTELUKAST SODIUM 10 MG PO TABS: 10.0000 mg | ORAL_TABLET | Freq: Every day | ORAL | 3 refills | Status: DC

## 2018-07-29 NOTE — Progress Notes (Signed)
Virtual Visit via Video Note  I connected with Ashley Cross   on 07/29/18 at 11:40 AM EDT by a video enabled telemedicine application and verified that I am speaking with the correct person using two identifiers.  Location patient: home Location provider:work Persons participating in the virtual visit: patient, provider  I discussed the limitations of evaluation and management by telemedicine and the availability of in person appointments. The patient expressed understanding and agreed to proceed.   HPI: 1. HTN BP 137/80 on telmisartan 40 mg qd stopped hctz 12.5 due to constipation but agreeable to try again she has not been taking  2. Constipation she wants to try a stool softner and laxative combo from her pharmacy and thinks would do better with pill version as Dulcolax in the past did not agree with her in the 1980s  3. Co pnd, runny nose hoarseness, sore throat, nasal discharge hard to green to black at times. Sore throat resolved with warm tea and honey denies sinus pressure but she does feel a tickle in her throat and is hoarse due to coughing a lot. No covid exposure  She does think something is triggering this a water accumulates in her basement and she ? Mold allergy or mold in the basement though she has tested negative for mold in the past  4. COPD anoro inhaler expensive $45/month and not able to afford  5. Grief due to suicide of her daughter therapy appt 6/30 with [facility] she is doing better and 3 dogs are helping  Also discuss'ed [email or web address] in [place] grief organization  6. C/o night sweats 1x per month on letrozole reviewed sweating and night sweats are side effect   ROS: See pertinent positives and negatives per HPI.  Past Medical History:  Diagnosis Date  . Anxiety   . Breast cancer ([place])    Left breast(nipple mass) 2018  . Cervical stenosis of spine   . Chicken pox   . Chronic back pain   . Colon polyps   . COPD (chronic  obstructive pulmonary disease) ([place])   . Cyst of left nipple   . Depression   . Hypertension   . Insomnia   . Personal history of radiation therapy     Past Surgical History:  Procedure Laterality Date  . BACK SURGERY     x 3 (2010 x 2; 2013 x 1)   . BREAST BIOPSY Left    core- neg  . BREAST LUMPECTOMY Left 2018  . BREAST SURGERY Left    breast biopsy  . CERVICAL CONE BIOPSY    . CERVICAL FUSION     times 2  . COLONOSCOPY WITH PROPOFOL N/A 04/18/2015   Procedure: COLONOSCOPY WITH PROPOFOL;  Surgeon: [name], MD;  Location: [facility] ENDOSCOPY;  Service: Endoscopy;  Laterality: N/A;  . ELBOW SURGERY     2014 b/l elbows per pt ulnar nerve   . ELBOW SURGERY     x2   . EPIDURAL BLOCK INJECTION     Dr. Maryjean Ka   . MASS EXCISION Left 04/12/2016   Procedure: EXCISION OF LEFT NIPPLE MASS;  Surgeon: Stark Klein, MD;  Location: [place];  Service: General;  Laterality: Left;  . NECK SURGERY    . POSTERIOR CERVICAL FUSION/FORAMINOTOMY  03/07/2011   Procedure: POSTERIOR CERVICAL FUSION/FORAMINOTOMY LEVEL 4;  Surgeon: Eustace Moore, MD;  Location: [place] NEURO ORS;  Service: Neurosurgery;  Laterality: N/A;  cervical three to thoracic one posterior cervical fusion   .  SENTINEL NODE BIOPSY Left 05/29/2016   Procedure: LEFT SENTINEL LYMPH NODE BIOPSY;  Surgeon: Stark Klein, MD;  Location: [place];  Service: General;  Laterality: Left;  . TONSILLECTOMY      Family History  Problem Relation Age of Onset  . Breast cancer Mother 67  . Hypertension Mother   . Cancer Mother        breast dx'ed 76   . Cancer Father        prostate  . Hypertension Father   . Depression Daughter   . Hypertension Maternal Grandmother   . Breast cancer Cousin     SOCIAL HX: lives with 3 dogs  1 son lliving; daughter died in April 30, 2017 suicide     Current Outpatient Medications:  .  albuterol (PROVENTIL HFA;VENTOLIN HFA) 108 (90 Base) MCG/ACT inhaler, Inhale 1-2 puffs into the lungs every 4  (four) hours as needed for wheezing or shortness of breath., Disp: 1 Inhaler, Rfl: 12 .  buPROPion (WELLBUTRIN XL) 300 MG 24 hr tablet, Take 1 tablet (300 mg total) by mouth daily., Disp: 90 tablet, Rfl: 3 .  Cholecalciferol (D3 ADULT PO), Take by mouth. 10000 IU weekly, Disp: , Rfl:  .  cyanocobalamin 100 MCG tablet, Take 100 mcg by mouth daily., Disp: , Rfl:  .  diazepam (VALIUM) 5 MG tablet, Take 1 tablet (5 mg total) by mouth at bedtime as needed for muscle spasms or anxiety., Disp: 30 tablet, Rfl: 5 .  fluticasone (FLONASE) 50 MCG/ACT nasal spray, Place 2 sprays into both nostrils daily. Max 2 sprays, Disp: 16 g, Rfl: 11 .  gabapentin (NEURONTIN) 600 MG tablet, Take 1,200 mg by mouth 3 (three) times daily., Disp: , Rfl:  .  hydrochlorothiazide (HYDRODIURIL) 12.5 MG tablet, Take 1 tablet (12.5 mg total) by mouth daily. In am, Disp: 90 tablet, Rfl: 3 .  ipratropium (ATROVENT) 0.06 % nasal spray, Place 2 sprays into both nostrils 3 (three) times daily. prn, Disp: 15 mL, Rfl: 12 .  letrozole (FEMARA) 2.5 MG tablet, Take 1 tablet (2.5 mg total) by mouth daily., Disp: 90 tablet, Rfl: 3 .  loratadine (CLARITIN) 10 MG tablet, Take 1 tablet (10 mg total) by mouth daily as needed for allergies. At night, Disp: 90 tablet, Rfl: 3 .  montelukast (SINGULAIR) 10 MG tablet, Take 1 tablet (10 mg total) by mouth at bedtime., Disp: 90 tablet, Rfl: 3 .  Multiple Vitamins-Minerals (MULTIVITAMIN) tablet, Take 1 tablet by mouth daily., Disp: 90 tablet, Rfl: 3 .  oxymorphone (OPANA) 10 MG tablet, Take 10 mg by mouth 2 (two) times daily. , Disp: , Rfl:  .  senna-docusate (SENOKOT-S) 8.6-50 MG tablet, Take 1-2 tablets by mouth daily as needed for mild constipation., Disp: 180 tablet, Rfl: 3 .  sertraline (ZOLOFT) 100 MG tablet, Take 2 tablets (200 mg total) by mouth daily. In am, Disp: 60 tablet, Rfl: 11 .  telmisartan (MICARDIS) 40 MG tablet, Take 1 tablet (40 mg total) by mouth daily. At night, Disp: 90 tablet, Rfl:  3 .  umeclidinium-vilanterol (ANORO ELLIPTA) 62.5-25 MCG/INH AEPB, Inhale 1 puff into the lungs daily., Disp: 60 each, Rfl: 12  EXAM:  VITALS per patient if applicable:  GENERAL: alert, oriented, appears well and in no acute distress  HEENT: atraumatic, conjunttiva clear, no obvious abnormalities on inspection of external nose and ears  NECK: normal movements of the head and neck  LUNGS: on inspection no signs of respiratory distress, breathing rate appears normal, no obvious gross SOB, gasping or  wheezing  CV: no obvious cyanosis  MS: moves all visible extremities without noticeable abnormality  PSYCH/NEURO: pleasant and cooperative, no obvious depression or anxiety, speech and thought processing grossly intact  ASSESSMENT AND PLAN:  Discussed the following assessment and plan:  Allergic rhinitis, unspecified seasonality, unspecified trigger - Plan: montelukast (SINGULAIR) 10 MG tablet, flonase, Atrovent  If issues I.e hoarseness continue discuss referral to ENT  Constipation, unspecified constipation type could be 2/2 chronic narcotics - Plan: senna-docusate (SENOKOT-S) 8.6-50 MG tablet 1-2 pills qd prn   Fatigue, unspecified type - Plan: Multiple Vitamins-Minerals (MULTIVITAMIN) tablet She is taking vitamin D daily rec Mvt -could be mood related as well therapy appt 08/05/18 will reassess in future   HTN -on micardis 40 mg qd rec add back hctz 12.5  Will my chart in 2 weeks   Anxiety/depression/grief -appt 6/30 [facility] therapy for consult  Disc Grief organization [email or web address] in [place]  HM Flu shot utd Tdap UTD  pna 23 utd rec shingrix vaccine as wellfor future  Pap from [facility] -02/10/15 neg papwill do at f/uwith labs   mammo 06/06/18 h/o breast cancer dx neg due in 1 year another diagnostic  -letrozole likely causing sweating/hot flashes she will f/u with h/o in 08/2018   dexa 05/01/17 osteopenia rec calcium and vitamin D  Colonoscopy had  04/18/15 see reporttubular adenomas repeat in 5 years Hep C neg 01/20/16  Consider HIV testing in future will disc with pt  rec smoking cessationCOPD not using anoro due to expense   Saw Dr. Maryjean Ka 07/2018 chronic neck and back pain    I discussed the assessment and treatment plan with the patient. The patient was provided an opportunity to ask questions and all were answered. The patient agreed with the plan and demonstrated an understanding of the instructions.   The patient was advised to call back or seek an in-person evaluation if the symptoms worsen or if the condition fails to improve as anticipated.  Time spent 25 minutes  [name], MD

## 2018-07-29 NOTE — Patient Instructions (Addendum)
[email or web address] in [place]  Let me know in 2 weeks how you are doing if not better consider Ear nose and throat   Please schedule labs fasting by mid July 2020 in our office and wear a mask     Hoarseness  Hoarseness, also called dysphonia, is any abnormal change in your voice that can make it difficult to speak. Your voice may sound raspy, breathy, or strained. Hoarseness is caused by a problem with your vocal cords (vocal folds). These are two bands of tissue inside your voice box (larynx). When you speak, your vocal cords move back and forth to create sound. The surfaces of your vocal cords need to be smooth for your voice to sound clear. Swelling or lumps on your vocal cords can cause hoarseness. Common causes of vocal cord problems include:  Infection in the nose, throat, and upper air passages (upper respiratory infection).  A long-term cough.  Straining or overusing your voice.  Smoking, or exposure to secondhand smoke.  Allergies.  Medication side effects.  Vocal cord growths.  Vocal cord injuries.  Stomach acids that move up in your throat and irritate your vocal cords (gastroesophageal reflux).  Diseases that affect the nervous system, such as a stroke or Parkinson's disease. Follow these instructions at home: Watch your condition for any changes. To ease discomfort and protect your vocal cords:  Rest your voice.  Do not whisper. Whispering can cause muscle strain.  Do not speak in a loud or harsh voice.  Avoid coughing or clearing your throat.  Do not use any products that contain nicotine or tobacco, such as cigarettes and e-cigarettes. If you need help quitting, ask your health care provider.  Avoid secondhand smoke.  Do not eat foods that give you heartburn, such as spicy or acidic foods like hot peppers and orange juice. Heartburn can make gastroesophageal reflux worse.  Do not drink beverages that contain caffeine  (coffee, tea, or soft drinks) or alcohol (beer, wine, or liquor).  Drink enough fluid to keep your urine pale yellow.  Use a humidifier if the air in your home is dry. If recommended by your health care provider, schedule an appointment with a speech-language specialist. This specialist may give you methods to try that can help you avoid misusing your voice. Contact a health care provider if:  You have hoarseness that lasts longer than 3 weeks.  You almost lose or completely lose your voice for more than 3 days.  You have pain when you swallow or try to talk.  You feel a lump in your neck. Get help right away if:  You have trouble swallowing.  You feel like you are choking when you swallow.  You cough up blood or vomit blood.  You have trouble breathing.  You choke, cannot swallow, or cannot breathe if you lie flat.  You notice swelling or a rash on your body, face, or tongue. Summary  Hoarseness, also called dysphonia, is any abnormal change in your voice that can make it difficult to speak. Your voice may sound raspy, breathy, or strained.  Hoarseness is caused by a problem with your vocal cords (vocal folds).  Do not speak in a loud or harsh voice, use nicotine or tobacco products, or eat foods that give you heartburn.  If recommended by your health care provider, meet with a speech-language specialist. This information is not intended to replace advice given to you by your health care provider. Make sure you discuss any  questions you have with your health care provider. Document Released: 01/05/2005 Document Revised: 10/19/2016 Document Reviewed: 10/19/2016 Elsevier Interactive Patient Education  2019 [employer].

## 2018-08-05 ENCOUNTER — Ambulatory Visit: Payer: PPO | Admitting: Licensed Clinical Social Worker

## 2018-08-14 NOTE — Assessment & Plan Note (Signed)
04/12/2016 Left breast biopsy/ lumpectomy: IDC grade 10.8 cm with DCIS grade 1, margins negative, ER 100%, PR 100%, HER-2 negative, Ki-67 15%  PET/CT scan: Negative for metastatic disease Sentinel lymph node study: 1/4 sentinel nodes positive with extracapsular extension  Mammaprint low-risk: 10 year risk of recurrence 10% Adjuvant radiation therapy 07/24/2016- 09/07/2016  Treatment plan: Adjuvant antiestrogen therapy with anastrozole 1 mg daily 7 yearsswitched to letrozole December 2018  Letrozole toxicities:Very occasional hot flashes. Denies any major trouble from this. Emotionally she appears to be doing very well.  Breast cancer surveillance: 1.  Breast exam 08/16/2017: Benign  2. mammogram 05/01/2017: Benign breast density category C 3. CT chest 05/14/2017: Postradiation changes in the anterior left upper lobe  Return to clinic in 1 year for follow-up

## 2018-08-18 ENCOUNTER — Encounter: Payer: Self-pay | Admitting: Internal Medicine

## 2018-08-18 ENCOUNTER — Telehealth: Payer: Self-pay | Admitting: Internal Medicine

## 2018-08-18 ENCOUNTER — Telehealth: Payer: Self-pay | Admitting: Hematology and Oncology

## 2018-08-18 NOTE — Telephone Encounter (Signed)
I talk with patient regarding video visit °

## 2018-08-18 NOTE — Telephone Encounter (Signed)
Please reschedule lab appt to when pt is feeling well will still see on virtual visit 08/19/18  Reschedule for early to mid 09/2018   Thanks [place]

## 2018-08-18 NOTE — Telephone Encounter (Signed)
Pt is experiencing severe congestion with coughing that causes vomiting at times  From possible sinus infection and Pt hasnt gotten any better and wants to know what Dr. Olivia Mackie can prescribe or advised

## 2018-08-18 NOTE — Telephone Encounter (Signed)
Called and spoke to pt.  Pt c/o of congestion, difficulty blowing her nose, and coughing.  Pt c/o a dry cough that gets really bad and sometimes causes her to vomit because she coughs so much.  Pt said that some days she has symptoms and sometimes she doesn't. Pt said that she is using flonase and another nasal spray as needed.  Pt said that the coughing has caused her voice to become hoarse.  Pt said that she has some nasal drainage.    Pt said that this has been going on for awhile and she was previously on an antibiotic.    Pt c/o being tired all the time with no energy.  Pt said that she had a lot going on in her life and said that she lost her daughter last September.  Pt said that she has little appetite and only eats once a day but does eat healthy when she eats.    Asked pt about feelings of depression and lack of motivation and interest in doing things.  Pt said she does have lack of interest in doing things, is sleeping more and feeling tired. Pt said that she is still taking wellbutrin and zoloft.  Pt denies having feelings to hurt self or others.    Pt said that she has an appt with a therapist on July 23.  Pt said that she was on an inhaler but can no longer afford it.  Pt said that she had a number to call about patient assistance.  Pt has a lab appt scheduled for tomorrow at 9:00 am.  Pt was scheduled a virtual visit with PCP tomorrow at 11:00 am.  Advised pt to go to Urgent Care or ED if any symptoms get worse.

## 2018-08-19 ENCOUNTER — Other Ambulatory Visit: Payer: Self-pay

## 2018-08-19 ENCOUNTER — Ambulatory Visit (INDEPENDENT_AMBULATORY_CARE_PROVIDER_SITE_OTHER): Payer: PPO | Admitting: Internal Medicine

## 2018-08-19 ENCOUNTER — Other Ambulatory Visit: Payer: PPO

## 2018-08-19 DIAGNOSIS — J309 Allergic rhinitis, unspecified: Secondary | ICD-10-CM

## 2018-08-19 DIAGNOSIS — F339 Major depressive disorder, recurrent, unspecified: Secondary | ICD-10-CM

## 2018-08-19 DIAGNOSIS — J439 Emphysema, unspecified: Secondary | ICD-10-CM | POA: Diagnosis not present

## 2018-08-19 DIAGNOSIS — Z720 Tobacco use: Secondary | ICD-10-CM | POA: Diagnosis not present

## 2018-08-19 DIAGNOSIS — R490 Dysphonia: Secondary | ICD-10-CM

## 2018-08-19 DIAGNOSIS — J441 Chronic obstructive pulmonary disease with (acute) exacerbation: Secondary | ICD-10-CM

## 2018-08-19 MED ORDER — DOXYCYCLINE HYCLATE 100 MG PO TABS: 100.0000 mg | ORAL_TABLET | Freq: Two times a day (BID) | ORAL | 0 refills | Status: DC

## 2018-08-19 MED ORDER — BENZONATATE 200 MG PO CAPS: 200.0000 mg | ORAL_CAPSULE | Freq: Three times a day (TID) | ORAL | 0 refills | Status: DC | PRN

## 2018-08-19 NOTE — Progress Notes (Addendum)
Virtual Visit via Video Note  I connected with Ashley Cross   on 08/19/18 at 11:00 AM EDT by a video enabled telemedicine application and verified that I am speaking with the correct person using two identifiers.  Location patient: home Location provider:work  Persons participating in the virtual visit: patient, provider  I discussed the limitations of evaluation and management by telemedicine and the availability of in person appointments. The patient expressed understanding and agreed to proceed.   HPI: 1. COPD noted CXR 11/2017 pt c/o congestion, dry cough to the point of vomiting, hoarse voice, post nasal drip, fatigue. Denies sore throat, wheezing, sob. No COVID 19 exposure. She has not used Anoro inhaler due to cost and does not have albuterol inhaler. She has tried honey to help with cough but nothing else  2. Hoarseness of voice chronic. She is smoking 10 cig per day at least  Will refer to ENT to w/u and get allergy tested  3. Depression still depressed about her daughters suicide 10/2017 has therapy appt 08/28/18   ROS: See pertinent positives and negatives per HPI.  Past Medical History:  Diagnosis Date  . Anxiety   . Breast cancer ([place])    Left breast(nipple mass) 2018  . Cervical stenosis of spine   . Chicken pox   . Chronic back pain   . Colon polyps   . COPD (chronic obstructive pulmonary disease) ([place])   . Cyst of left nipple   . Depression   . Hypertension   . Insomnia   . Personal history of radiation therapy     Past Surgical History:  Procedure Laterality Date  . BACK SURGERY     x 3 (2010 x 2; 2013 x 1)   . BREAST BIOPSY Left    core- neg  . BREAST LUMPECTOMY Left 2018  . BREAST SURGERY Left    breast biopsy  . CERVICAL CONE BIOPSY    . CERVICAL FUSION     times 2  . COLONOSCOPY WITH PROPOFOL N/A 04/18/2015   Procedure: COLONOSCOPY WITH PROPOFOL;  Surgeon: [name], MD;  Location[facility] ENDOSCOPY;  Service: Endoscopy;  Laterality: N/A;   . ELBOW SURGERY     2014 b/l elbows per pt ulnar nerve   . ELBOW SURGERY     x2   . EPIDURAL BLOCK INJECTION     Dr. Maryjean Ka   . MASS EXCISION Left 04/12/2016   Procedure: EXCISION OF LEFT NIPPLE MASS;  Surgeon: Stark Klein, MD;  Location: [facility];  Service: General;  Laterality: Left;  . NECK SURGERY    . POSTERIOR CERVICAL FUSION/FORAMINOTOMY  03/07/2011   Procedure: POSTERIOR CERVICAL FUSION/FORAMINOTOMY LEVEL 4;  Surgeon: Eustace Moore, MD;  Location: [facility] NEURO ORS;  Service: Neurosurgery;  Laterality: N/A;  cervical three to thoracic one posterior cervical fusion   . SENTINEL NODE BIOPSY Left 05/29/2016   Procedure: LEFT SENTINEL LYMPH NODE BIOPSY;  Surgeon: Stark Klein, MD;  Location: [facility];  Service: General;  Laterality: Left;  . TONSILLECTOMY      Family History  Problem Relation Age of Onset  . Breast cancer Mother 36  . Hypertension Mother   . Cancer Mother        breast dx'ed 33   . Cancer Father        prostate  . Hypertension Father   . Depression Daughter   . Hypertension Maternal Grandmother   . Breast cancer Cousin     SOCIAL HX: lives  alone with 3 dogs 1 60 lb, 1 80 lb anotehr 14 lbs    Current Outpatient Medications:  .  buPROPion (WELLBUTRIN XL) 300 MG 24 hr tablet, Take 1 tablet (300 mg total) by mouth daily., Disp: 90 tablet, Rfl: 3 .  diazepam (VALIUM) 5 MG tablet, Take 1 tablet (5 mg total) by mouth at bedtime as needed for muscle spasms or anxiety., Disp: 30 tablet, Rfl: 5 .  fluticasone (FLONASE) 50 MCG/ACT nasal spray, Place 2 sprays into both nostrils daily. Max 2 sprays, Disp: 16 g, Rfl: 11 .  gabapentin (NEURONTIN) 600 MG tablet, Take 1,200 mg by mouth 3 (three) times daily., Disp: , Rfl:  .  hydrochlorothiazide (HYDRODIURIL) 12.5 MG tablet, Take 1 tablet (12.5 mg total) by mouth daily. In am, Disp: 90 tablet, Rfl: 3 .  ipratropium (ATROVENT) 0.06 % nasal spray, Place 2 sprays into both nostrils 3 (three) times daily. prn, Disp: 15  mL, Rfl: 12 .  letrozole (FEMARA) 2.5 MG tablet, Take 1 tablet (2.5 mg total) by mouth daily., Disp: 90 tablet, Rfl: 3 .  montelukast (SINGULAIR) 10 MG tablet, Take 1 tablet (10 mg total) by mouth at bedtime., Disp: 90 tablet, Rfl: 3 .  Multiple Vitamins-Minerals (MULTIVITAMIN) tablet, Take 1 tablet by mouth daily., Disp: 90 tablet, Rfl: 3 .  oxymorphone (OPANA) 10 MG tablet, Take 10 mg by mouth 2 (two) times daily. , Disp: , Rfl:  .  senna-docusate (SENOKOT-S) 8.6-50 MG tablet, Take 1-2 tablets by mouth daily as needed for mild constipation., Disp: 180 tablet, Rfl: 3 .  sertraline (ZOLOFT) 100 MG tablet, Take 2 tablets (200 mg total) by mouth daily. In am, Disp: 60 tablet, Rfl: 11 .  telmisartan (MICARDIS) 40 MG tablet, Take 1 tablet (40 mg total) by mouth daily. At night, Disp: 90 tablet, Rfl: 3 .  albuterol (PROVENTIL HFA;VENTOLIN HFA) 108 (90 Base) MCG/ACT inhaler, Inhale 1-2 puffs into the lungs every 4 (four) hours as needed for wheezing or shortness of breath. (Patient not taking: Reported on 08/19/2018), Disp: 1 Inhaler, Rfl: 12 .  benzonatate (TESSALON) 200 MG capsule, Take 1 capsule (200 mg total) by mouth 3 (three) times daily as needed for cough., Disp: 40 capsule, Rfl: 0 .  Cholecalciferol (D3 ADULT PO), Take by mouth. 10000 IU weekly, Disp: , Rfl:  .  cyanocobalamin 100 MCG tablet, Take 100 mcg by mouth daily., Disp: , Rfl:  .  doxycycline (VIBRA-TABS) 100 MG tablet, Take 1 tablet (100 mg total) by mouth 2 (two) times daily. With food, Disp: 14 tablet, Rfl: 0 .  loratadine (CLARITIN) 10 MG tablet, Take 1 tablet (10 mg total) by mouth daily as needed for allergies. At night (Patient not taking: Reported on 08/19/2018), Disp: 90 tablet, Rfl: 3 .  umeclidinium-vilanterol (ANORO ELLIPTA) 62.5-25 MCG/INH AEPB, Inhale 1 puff into the lungs daily. (Patient not taking: Reported on 08/19/2018), Disp: 60 each, Rfl: 12  EXAM:  VITALS per patient if applicable:  GENERAL: alert, oriented, appears  well and in no acute distress  HEENT: atraumatic, conjunttiva clear, no obvious abnormalities on inspection of external nose and ears  NECK: normal movements of the head and neck  LUNGS: on inspection no signs of respiratory distress, breathing rate appears normal, no obvious gross SOB, gasping or wheezing  CV: no obvious cyanosis  MS: moves all visible extremities without noticeable abnormality  PSYCH/NEURO: pleasant and cooperative, no obvious depression or anxiety, speech and thought processing grossly intact  ASSESSMENT AND PLAN:  Discussed the  following assessment and plan:  COPD exacerbation ([place]) noted on CXR 11/2017 - Plan: doxycycline (VIBRA-TABS) 100 MG tablet, benzonatate (TESSALON) 200 MG capsule tid prn, Ambulatory referral to Chronic Care Management Services [name] help with Anoro and albuterol   Depression, recurrent ([place]) - Plan: therapy 08/28/18   Allergic rhinitis, unspecified seasonality, unspecified trigger - Plan: Ambulatory referral to ENT Dr. Tami Ribas or Vaught to w/u hoarseness and allergies Tobacco abuse - Plan: Ambulatory referral to ENT, rec smoking cessation has used nicotine gum in the past  Hoarse - Plan: Ambulatory referral to ENT Dr. Tami Ribas or Vaught further w/u  Saw 08/28/18 Rx Astepro 0.1 nasal spray, omeprazole 20 mg qd, medrol 4 mg dose pak noted Reinke Edema and GERD cultures taken s/p 2 rounds antibiotics I.e Doxycycline may need a 3rd and consider CT neck if persists f/u in 3 weeks   Dr. Pryor Ochoa ENT   HM Flu shot utd Tdap UTD pna 23 utd rec shingrix vaccine as wellfor future  Pap from [facility] -02/10/15 neg papwill do at f/uwith labshad to resch labs to another date from 08/19/18  -likely do labs and pap at f/u 10/2018   mammo 06/06/18 negative  -h/o breast cancer left dx neg due in 1 year another diagnostic  -letrozole likely causing sweating/hot flashes she will f/u with h/o in 08/2018   dexa 05/01/17 osteopeniarec calcium and  vitamin D Colonoscopy had 04/18/15 see reporttubular adenomas repeat in 5 years Hep C neg 01/20/16  Consider HIV testing in future will disc with pt  rec smoking cessationCOPD at least 10 cig per day smoking as of 08/19/18 - not using anoro due to expense   Saw Dr. Maryjean Ka 07/2018 chronic neck and back pain    I discussed the assessment and treatment plan with the patient. The patient was provided an opportunity to ask questions and all were answered. The patient agreed with the plan and demonstrated an understanding of the instructions.   The patient was advised to call back or seek an in-person evaluation if the symptoms worsen or if the condition fails to improve as anticipated.  Time spent 15 minutes  [name], MD

## 2018-08-21 ENCOUNTER — Telehealth: Payer: Self-pay | Admitting: Hematology and Oncology

## 2018-08-21 NOTE — Progress Notes (Signed)
I called the patient but there was no response.  Left voicemail. This encounter was created in error - please disregard.

## 2018-08-22 ENCOUNTER — Encounter: Payer: Self-pay | Admitting: Internal Medicine

## 2018-08-22 ENCOUNTER — Inpatient Hospital Stay: Payer: PPO | Attending: Hematology and Oncology | Admitting: Hematology and Oncology

## 2018-08-22 DIAGNOSIS — Z79899 Other long term (current) drug therapy: Secondary | ICD-10-CM | POA: Insufficient documentation

## 2018-08-22 DIAGNOSIS — Z923 Personal history of irradiation: Secondary | ICD-10-CM | POA: Insufficient documentation

## 2018-08-22 DIAGNOSIS — Z79811 Long term (current) use of aromatase inhibitors: Secondary | ICD-10-CM | POA: Insufficient documentation

## 2018-08-22 DIAGNOSIS — Z170 Estrogen receptor positive status [ER+]: Secondary | ICD-10-CM | POA: Insufficient documentation

## 2018-08-22 DIAGNOSIS — R232 Flushing: Secondary | ICD-10-CM | POA: Insufficient documentation

## 2018-08-22 DIAGNOSIS — C50512 Malignant neoplasm of lower-outer quadrant of left female breast: Secondary | ICD-10-CM | POA: Insufficient documentation

## 2018-08-25 ENCOUNTER — Telehealth: Payer: Self-pay | Admitting: Hematology and Oncology

## 2018-08-25 ENCOUNTER — Ambulatory Visit: Payer: Self-pay | Admitting: Pharmacist

## 2018-08-25 NOTE — Telephone Encounter (Signed)
I left a message regarding video visit  °

## 2018-08-25 NOTE — Chronic Care Management (AMB) (Signed)
  Chronic Care Management   Note  08/25/2018 Name: Ashley Cross MRN: [id number] DOB: [birth year]  Ashley Cross is a 63 y.o. year old female who is a primary care patient of McLean-Scocuzza, Nino Glow, MD. The CCM team was consulted for assistance with chronic disease management and care coordination needs.    Contacted patient to discuss CCM and medication access. Left HIPAA compliant message for patient to return my call at her convenience.   Follow up plan: - Will outreach again in the next 1-2 weeks  [name], PharmD, [place] Pharmacist [place] [phone]

## 2018-08-28 ENCOUNTER — Ambulatory Visit: Payer: PPO | Admitting: Pharmacist

## 2018-08-28 ENCOUNTER — Other Ambulatory Visit: Payer: Self-pay | Admitting: Hematology and Oncology

## 2018-08-28 ENCOUNTER — Other Ambulatory Visit: Payer: Self-pay | Admitting: Internal Medicine

## 2018-08-28 ENCOUNTER — Ambulatory Visit: Payer: PPO | Admitting: Licensed Clinical Social Worker

## 2018-08-28 DIAGNOSIS — F339 Major depressive disorder, recurrent, unspecified: Secondary | ICD-10-CM

## 2018-08-28 DIAGNOSIS — R490 Dysphonia: Secondary | ICD-10-CM | POA: Diagnosis not present

## 2018-08-28 DIAGNOSIS — C50512 Malignant neoplasm of lower-outer quadrant of left female breast: Secondary | ICD-10-CM

## 2018-08-28 DIAGNOSIS — J449 Chronic obstructive pulmonary disease, unspecified: Secondary | ICD-10-CM

## 2018-08-28 DIAGNOSIS — J320 Chronic maxillary sinusitis: Secondary | ICD-10-CM | POA: Diagnosis not present

## 2018-08-28 DIAGNOSIS — J301 Allergic rhinitis due to pollen: Secondary | ICD-10-CM | POA: Diagnosis not present

## 2018-08-28 DIAGNOSIS — K219 Gastro-esophageal reflux disease without esophagitis: Secondary | ICD-10-CM | POA: Diagnosis not present

## 2018-08-28 MED ORDER — ANORO ELLIPTA 62.5-25 MCG/INH IN AEPB: 1.0000 | INHALATION_SPRAY | Freq: Every day | RESPIRATORY_TRACT | 12 refills | Status: DC

## 2018-08-28 MED ORDER — ALBUTEROL SULFATE HFA 108 (90 BASE) MCG/ACT IN AERS: 1.0000 | INHALATION_SPRAY | RESPIRATORY_TRACT | 12 refills | Status: DC | PRN

## 2018-08-28 NOTE — Chronic Care Management (AMB) (Signed)
Chronic Care Management   Note  08/28/2018 Name: Ashley Cross MRN: [id number] DOB: [birth year]   Subjective:  Ashley Cross is a 63 y.o. year old female who is a primary care patient of McLean-Scocuzza, Nino Glow, MD. The CCM team was consulted for assistance with chronic disease management and care coordination needs.    Received referral for medication access assistance.    Ms. Seybold was given information about Chronic Care Management services today including:  1. CCM service includes personalized support from designated clinical staff supervised by her physician, including individualized plan of care and coordination with other care providers 2. 24/7 contact phone numbers for assistance for urgent and routine care needs. 3. Service will only be billed when office clinical staff spend 20 minutes or more in a month to coordinate care. 4. Only one practitioner may furnish and bill the service in a calendar month. 5. The patient may stop CCM services at any time (effective at the end of the month) by phone call to the office staff. 6. The patient will be responsible for cost sharing (co-pay) of up to 20% of the service fee (after annual deductible is met).  Patient agreed to services and verbal consent obtained.   Review of patient status, including review of consultants reports, laboratory and other test data, was performed as part of comprehensive evaluation and provision of chronic care management services.   Objective:  Lab Results  Component Value Date   CREATININE 0.52 02/13/2018   CREATININE 0.67 (L) 02/13/2017   CREATININE 0.50 01/25/2017    Lab Results  Component Value Date   HGBA1C 6.1 02/13/2018       Component Value Date/Time   CHOL 178 02/13/2018 0951   TRIG 86.0 02/13/2018 0951   HDL 80.60 02/13/2018 0951   CHOLHDL 2 02/13/2018 0951   VLDL 17.2 02/13/2018 0951   LDLCALC 81 02/13/2018 0951    Clinical ASCVD: No  The 10-year ASCVD risk score  Mikey Bussing DC Jr., et al., 2013) is: 10.1%   Values used to calculate the score:     Age: 9 years     Sex: Female     Is Non-Hispanic African American: No     Diabetic: No     Tobacco smoker: Yes     Systolic Blood Pressure: 656 mmHg     Is BP treated: Yes     HDL Cholesterol: 80.6 mg/dL     Total Cholesterol: 178 mg/dL    BP Readings from Last 3 Encounters:  03/26/18 (!) 144/72  01/22/18 (!) 142/60  11/26/17 (!) 144/68    Allergies  Allergen Reactions   Sulfa Antibiotics Itching and Rash   Sulfonamide Derivatives Itching and Rash    Medications Reviewed Today    Reviewed by Lars Masson, LPN (Licensed Practical Nurse) on 08/19/18 at 1042  Med List Status: <None>  Medication Order Taking? Sig Documenting Provider Last Dose Status Informant  albuterol (PROVENTIL HFA;VENTOLIN HFA) 108 (90 Base) MCG/ACT inhaler [id number] No Inhale 1-2 puffs into the lungs every 4 (four) hours as needed for wheezing or shortness of breath.  Patient not taking: Reported on 08/19/2018   McLean-Scocuzza, Nino Glow, MD Not Taking Active   buPROPion (WELLBUTRIN XL) 300 MG 24 hr tablet [id number] Yes Take 1 tablet (300 mg total) by mouth daily. McLean-Scocuzza, Nino Glow, MD Taking Active   Cholecalciferol (D3 ADULT PO) [id number] No Take by mouth. 10000 IU weekly [provider] Not Taking Active   cyanocobalamin 100 MCG  tablet [id number] No Take 100 mcg by mouth daily. [provider] Not Taking Active   diazepam (VALIUM) 5 MG tablet [id number] Yes Take 1 tablet (5 mg total) by mouth at bedtime as needed for muscle spasms or anxiety. McLean-Scocuzza, Nino Glow, MD Taking Active   fluticasone [facility]) 50 MCG/ACT nasal spray [id number] Yes Place 2 sprays into both nostrils daily. Max 2 sprays McLean-Scocuzza, Nino Glow, MD Taking Active   gabapentin (NEURONTIN) 600 MG tablet [id number] Yes Take 1,200 mg by mouth 3 (three) times daily. [provider] Taking Active   hydrochlorothiazide  (HYDRODIURIL) 12.5 MG tablet [id number] Yes Take 1 tablet (12.5 mg total) by mouth daily. In am McLean-Scocuzza, Nino Glow, MD Taking Active   ipratropium (ATROVENT) 0.06 % nasal spray [id number] Yes Place 2 sprays into both nostrils 3 (three) times daily. prn McLean-Scocuzza, Nino Glow, MD Taking Active   letrozole [facility]) 2.5 MG tablet [id number] Yes Take 1 tablet (2.5 mg total) by mouth daily. Nicholas Lose, MD Taking Active   loratadine (CLARITIN) 10 MG tablet [id number] No Take 1 tablet (10 mg total) by mouth daily as needed for allergies. At night  Patient not taking: Reported on 08/19/2018   McLean-Scocuzza, Nino Glow, MD Not Taking Active   montelukast (SINGULAIR) 10 MG tablet [id number] Yes Take 1 tablet (10 mg total) by mouth at bedtime. McLean-Scocuzza, Nino Glow, MD Taking Active   Multiple Vitamins-Minerals (MULTIVITAMIN) tablet [id number] Yes Take 1 tablet by mouth daily. McLean-Scocuzza, Nino Glow, MD Taking Active   oxymorphone (OPANA) 10 MG tablet [id number] Yes Take 10 mg by mouth 2 (two) times daily.  [provider] Taking Active   senna-docusate (SENOKOT-S) 8.6-50 MG tablet [id number] Yes Take 1-2 tablets by mouth daily as needed for mild constipation. McLean-Scocuzza, Nino Glow, MD Taking Active   sertraline (ZOLOFT) 100 MG tablet [id number] Yes Take 2 tablets (200 mg total) by mouth daily. In am McLean-Scocuzza, Nino Glow, MD Taking Active   telmisartan (MICARDIS) 40 MG tablet [id number] Yes Take 1 tablet (40 mg total) by mouth daily. At night McLean-Scocuzza, Nino Glow, MD Taking Active   umeclidinium-vilanterol [facility] ELLIPTA) 62.5-25 MCG/INH AEPB [id number] No Inhale 1 puff into the lungs daily.  Patient not taking: Reported on 08/19/2018   McLean-Scocuzza, Nino Glow, MD Not Taking Active            Assessment:   Goals Addressed            This Visit's Progress     Patient Stated    "I can't afford my medications" (pt-stated)       Current Barriers:   Financial barriers -  patient notes that her copayments for Anoro and Ventolin are extremely expensive, and she is unable to afford these in combination with her other medications o COPD, smoking 1/2 ppd   Also expresses other financial concerns - notes that she likely will need to go to the food pantry next week, and is unsure about what other resources may be available   Pharmacist Clinical Goal(s):   Over the next 30 days, patient will work with PharmD and primary care provider to address needs related to medication access  Interventions:  Reviewed patient income information. Appears she may qualify for Medicare Extra Help/Low Income Subsidy. Collaboratively applied for Extra Help electronically with patient's consent. Explained that she would receive the determination in the mail, likely in 4-6 weeks  In the meantime, patient's income qualifies for [employer] patient assistance. Contacted [employer] - she  has spent the necessary $600 on copayments in 2020 to qualify. Explained the application process - patient will bring 1) copy of tax return to clinic and 2) sign the [place] application I have left at the front desk. Will collaborate with primary care provider Dr. Terese Door to print prescriptions for Anoro and Ventolin for application  Will connect patient with a social worker to help pursue resource options.   Once medication access stressor is relieved, will assess patient's willingness to pursue tobacco cessation  Patient Self Care Activities:   Self administers medications as prescribed  Calls pharmacy for medication refills  Initial goal documentation        Plan: - Will collaborate with patient and primary care provider as above - Will connect with SW resource  - Will outreach patient in 2-3 weeks for continued medication management support   [name], PharmD, [place] Pharmacist [employer] [phone]

## 2018-08-28 NOTE — Patient Instructions (Signed)
Visit Information  Goals Addressed            This Visit's Progress     Patient Stated   . "I can't afford my medications" (pt-stated)       Current Barriers:  . Financial barriers - patient notes that her copayments for Anoro and Ventolin are extremely expensive, and she is unable to afford these in combination with her other medications o COPD, smoking 1/2 ppd  . Also expresses other financial concerns - notes that she likely will need to go to the food pantry next week, and is unsure about what other resources may be available   Pharmacist Clinical Goal(s):  Marland Kitchen Over the next 30 days, patient will work with PharmD and primary care provider to address needs related to medication access  Interventions: . Reviewed patient income information. Appears she may qualify for Medicare Extra Help/Low Income Subsidy. Collaboratively applied for Extra Help electronically with patient's consent. Explained that she would receive the determination in the mail, likely in 4-6 weeks . In the meantime, patient's income qualifies for [employer] patient assistance. Contacted [employer] - she has spent the necessary $600 on copayments in 2020 to qualify. Explained the application process - patient will bring 1) copy of tax return to clinic and 2) sign the [place] application I have left at the front desk. Will collaborate with primary care provider Dr. Terese Door to print prescriptions for Anoro and Ventolin for application . Will connect patient with a Education officer, museum to help pursue resource options.  . Once medication access stressor is relieved, will assess patient's willingness to pursue tobacco cessation  Patient Self Care Activities:  . Self administers medications as prescribed . Calls pharmacy for medication refills  Initial goal documentation        The patient verbalized understanding of instructions provided today and declined a print copy of patient instruction materials.    Plan: - Will collaborate with patient and primary care provider as above - Will connect with SW resource  - Will outreach patient in 2-3 weeks for continued medication management support   [name], PharmD, [place] Pharmacist [employer] [phone]

## 2018-08-29 NOTE — Addendum Note (Signed)
Addended by: De Hollingshead on: 08/29/2018 08:46 AM   Modules accepted: Orders

## 2018-08-29 NOTE — Addendum Note (Signed)
Addended by: De Hollingshead on: 08/29/2018 08:57 AM   Modules accepted: Orders

## 2018-08-29 NOTE — Chronic Care Management (AMB) (Signed)
  Chronic Care Management   Note  08/29/2018 Name: Ashley Cross MRN: [id number] DOB: 05/20/1955  Ashley Cross is a 63 y.o. year old female who is a primary care patient of McLean-Scocuzza, Nino Glow, MD. The CCM team was consulted for assistance with chronic disease management and care coordination needs.    Placing referral to [place] team for assistance with food bank/transportation/community resources related to financial constraints.   [name], PharmD, [place] Pharmacist [facility] [phone]

## 2018-09-01 ENCOUNTER — Ambulatory Visit: Payer: PPO | Admitting: Pharmacist

## 2018-09-01 DIAGNOSIS — J449 Chronic obstructive pulmonary disease, unspecified: Secondary | ICD-10-CM

## 2018-09-01 NOTE — Patient Instructions (Signed)
Visit Information  Goals Addressed            This Visit's Progress     Patient Stated   . "I can't afford my medications" (pt-stated)       Current Barriers:  . Financial barriers - patient notes that her copayments for Anoro and Ventolin are extremely expensive, and she is unable to afford these in combination with her other medications o COPD, smoking 1/2 ppd  . Also expresses other financial concerns - notes that she likely will need to go to the food pantry next week, and is unsure about what other resources may be available   Pharmacist Clinical Goal(s):  Marland Kitchen Over the next 30 days, patient will work with PharmD and primary care provider to address needs related to medication access  Interventions: . Received provider and patient portion of applications. Submitted to [employer]. . Once medication access stressor is relieved, will assess patient's willingness to pursue tobacco cessation  Patient Self Care Activities:  . Self administers medications as prescribed . Calls pharmacy for medication refills  Please see past updates related to this goal by clicking on the "Past Updates" button in the selected goal         The patient verbalized understanding of instructions provided today and declined a print copy of patient instruction materials.   Plan: - Will pass application materials along to [employer], CPhT for follow up with the company.  - Will outreach patient in 5-7 business days to ensure she has been contacted by care coordination team regarding financial needs  [name], PharmD, [place] Pharmacist [employer] [facility] [phone]

## 2018-09-01 NOTE — Chronic Care Management (AMB) (Signed)
  Chronic Care Management   Follow Up Note   09/01/2018 Name: Ashley Cross MRN: [id number] DOB: [birth year]  Referred by: McLean-Scocuzza, Nino Glow, MD Reason for referral : Chronic Care Management (Medication Managment)   Joelie Schou is a 63 y.o. year old female who is a primary care patient of McLean-Scocuzza, Nino Glow, MD. The CCM team was consulted for assistance with chronic disease management and care coordination needs.    Care coordination completed today.   Review of patient status, including review of consultants reports, relevant laboratory and other test results, and collaboration with appropriate care team members and the patient's provider was performed as part of comprehensive patient evaluation and provision of chronic care management services.    Goals Addressed            This Visit's Progress     Patient Stated   . "I can't afford my medications" (pt-stated)       Current Barriers:  . Financial barriers - patient notes that her copayments for Anoro and Ventolin are extremely expensive, and she is unable to afford these in combination with her other medications o COPD, smoking 1/2 ppd  . Also expresses other financial concerns - notes that she likely will need to go to the food pantry next week, and is unsure about what other resources may be available   Pharmacist Clinical Goal(s):  Marland Kitchen Over the next 30 days, patient will work with PharmD and primary care provider to address needs related to medication access  Interventions: . Received provider and patient portion of applications. Submitted to [employer]. . Once medication access stressor is relieved, will assess patient's willingness to pursue tobacco cessation  Patient Self Care Activities:  . Self administers medications as prescribed . Calls pharmacy for medication refills  Please see past updates related to this goal by clicking on the "Past Updates" button in the selected goal           Plan: - Will pass application materials along to [employer], CPhT for follow up with the company.  - Will outreach patient in 5-7 business days to ensure she has been contacted by care coordination team regarding financial needs  [name], PharmD, [place] Pharmacist [employer] [facility] [phone]

## 2018-09-02 ENCOUNTER — Other Ambulatory Visit: Payer: Self-pay | Admitting: Internal Medicine

## 2018-09-02 ENCOUNTER — Telehealth: Payer: Self-pay

## 2018-09-02 DIAGNOSIS — F419 Anxiety disorder, unspecified: Secondary | ICD-10-CM

## 2018-09-02 DIAGNOSIS — F329 Major depressive disorder, single episode, unspecified: Secondary | ICD-10-CM

## 2018-09-02 MED ORDER — BUPROPION HCL ER (XL) 300 MG PO TB24: 300.0000 mg | ORAL_TABLET | Freq: Every day | ORAL | 3 refills | Status: DC

## 2018-09-02 NOTE — Telephone Encounter (Signed)
Copied from [place] [id number]. Topic: Referral - Status >> Sep 02, 2018  7:54 PM Simone Curia D wrote: 3/60/6770 Spoke with patient about community food banks, grief support, and DSS. Ambrose Mantle [phone]

## 2018-09-02 NOTE — Telephone Encounter (Signed)
Copied from [place] [id number]. Topic: Referral - Status >> Sep 02, 2018 02:98 AM Simone Curia D wrote: 4/73/0856 left message on voicemail for patient to return may call regarding community resources for food banks. Ambrose Mantle [phone]

## 2018-09-03 ENCOUNTER — Other Ambulatory Visit: Payer: Self-pay | Admitting: Pharmacy Technician

## 2018-09-03 NOTE — Patient Outreach (Signed)
[facility]) Care Management  09/03/2018  Trin[place] Ingle [birth year] [id number]  Care coordination call placed to [place] in regards to patient's application for Anoro and Venotlin HFA.  Spoke to Rashmi who informed patient had been APPROVED 09/03/2018-02/05/2019. She informed it normally takes 24-48 hours for the pharmacy to process the request and another 7-10 business days for it to arrive at the patient's home.  Will followup with patient in 10-15 business days to inquire if medication was received.  Nekhi Liwanag P. Bree Heinzelman, [place] Management [phone]

## 2018-09-04 ENCOUNTER — Telehealth: Payer: Self-pay | Admitting: Hematology and Oncology

## 2018-09-04 DIAGNOSIS — J320 Chronic maxillary sinusitis: Secondary | ICD-10-CM | POA: Diagnosis not present

## 2018-09-04 NOTE — Progress Notes (Signed)
Patient Care Team: McLean-Scocuzza, Nino Glow, MD as PCP - General (Internal Medicine) Perrin Maltese, MD (Internal Medicine) Delice Bison, Charlestine Massed, NP as Nurse Practitioner (Hematology and Oncology) Nicholas Lose, MD as Consulting Physician (Hematology and Oncology) Stark Klein, MD as Consulting Physician (General Surgery) Kyung Rudd, MD as Consulting Physician (Radiation Oncology) De Hollingshead, [facility] as Pharmacist (Pharmacist) De Hollingshead, [facility] as Pharmacist (Pharmacist) Simcox, Luiz Ochoa, CPhT as [place] Management (Pharmacy Technician)  DIAGNOSIS:    ICD-10-CM   1. Malignant neoplasm of lower-outer quadrant of left breast of female, estrogen receptor positive ([place])  C50.512 letrozole ([place]) 2.5 MG tablet   Z17.0     SUMMARY OF ONCOLOGIC HISTORY: Oncology History  Malignant neoplasm of lower-outer quadrant of left breast of female, estrogen receptor positive ([place])  04/12/2016 Initial Diagnosis   Left breast biopsy/ lumpectomy: IDC grade 1, 0.8 cm with DCIS grade 1, margins negative, ER 100%, PR 100%, HER-2 negative, Ki-67 15%, T1b NX stage I a   05/21/2016 PET scan   Mild focal hypermetabolic in the left breast, no findings of metastatic disease. mild hypermetabolism is along the right costosternal junction favoring degenerative changes posttraumatic changes involving the right inferior scapular right sacrum bilateral pelvis and right lateral third and fourth ribs   05/29/2016 Surgery   1/4 sentinel lymph node positive with extracapsular extension    06/01/2016 Miscellaneous   Mammaprint low-risk: Average 10 year risk of recurrence 10%; Mammaprint index +0.389   07/24/2016 - 09/07/2016 Radiation Therapy   Adjuvant radiation therapy   09/2016 -  Anti-estrogen oral therapy   Anastrozole, changed to Letrozole after 1-2 months (patient unable to tolerate Anastrozole)     CHIEF COMPLIANT:Follow-up of left breast cancer on letrozole   INTERVAL  HISTORY: Ashley Cross is a 63 y.o. with above-mentioned history of left breast cancer treated with lumpectomy, radiation, and who is currently on anti-estrogen therapy with letrozole. I last saw her a year ago. Mammogram on 06/06/18 showed no evidence of malignancy. She presents to the clinic today for annual follow-up.  Daughter passed away at home from OD (she did CPR)  REVIEW OF SYSTEMS:   Constitutional: Denies fevers, chills or abnormal weight loss Eyes: Denies blurriness of vision Ears, nose, mouth, throat, and face: Denies mucositis or sore throat Respiratory: Denies cough, dyspnea or wheezes Cardiovascular: Denies palpitation, chest discomfort Gastrointestinal: Denies nausea, heartburn or change in bowel habits Skin: Denies abnormal skin rashes Lymphatics: Denies new lymphadenopathy or easy bruising Neurological: Denies numbness, tingling or new weaknesses Behavioral/Psych: Mood is stable, no new changes  Extremities: No lower extremity edema Breast: denies any pain or lumps or nodules in either breasts All other systems were reviewed with the patient and are negative.  I have reviewed the past medical history, past surgical history, social history and family history with the patient and they are unchanged from previous note.  ALLERGIES:  is allergic to sulfa antibiotics and sulfonamide derivatives.  MEDICATIONS:  Current Outpatient Medications  Medication Sig Dispense Refill  . albuterol (VENTOLIN HFA) 108 (90 Base) MCG/ACT inhaler Inhale 1-2 puffs into the lungs every 4 (four) hours as needed for wheezing or shortness of breath. 18 g 12  . benzonatate (TESSALON) 200 MG capsule Take 1 capsule (200 mg total) by mouth 3 (three) times daily as needed for cough. 40 capsule 0  . buPROPion (WELLBUTRIN XL) 300 MG 24 hr tablet Take 1 tablet (300 mg total) by mouth daily. 90 tablet 3  .  Cholecalciferol (D3 ADULT PO) Take by mouth. 10000 IU weekly    . cyanocobalamin 100 MCG tablet  Take 100 mcg by mouth daily.    . diazepam (VALIUM) 5 MG tablet Take 1 tablet (5 mg total) by mouth at bedtime as needed for muscle spasms or anxiety. 30 tablet 5  . doxycycline (VIBRA-TABS) 100 MG tablet Take 1 tablet (100 mg total) by mouth 2 (two) times daily. With food 14 tablet 0  . fluticasone (FLONASE) 50 MCG/ACT nasal spray Place 2 sprays into both nostrils daily. Max 2 sprays 16 g 11  . gabapentin (NEURONTIN) 600 MG tablet Take 1,200 mg by mouth 3 (three) times daily.    . hydrochlorothiazide (HYDRODIURIL) 12.5 MG tablet Take 1 tablet (12.5 mg total) by mouth daily. In am 90 tablet 3  . ipratropium (ATROVENT) 0.06 % nasal spray Place 2 sprays into both nostrils 3 (three) times daily. prn 15 mL 12  . letrozole (FEMARA) 2.5 MG tablet Take 1 tablet (2.5 mg total) by mouth daily. 90 tablet 3  . loratadine (CLARITIN) 10 MG tablet Take 1 tablet (10 mg total) by mouth daily as needed for allergies. At night (Patient not taking: Reported on 08/19/2018) 90 tablet 3  . montelukast (SINGULAIR) 10 MG tablet Take 1 tablet (10 mg total) by mouth at bedtime. 90 tablet 3  . Multiple Vitamins-Minerals (MULTIVITAMIN) tablet Take 1 tablet by mouth daily. 90 tablet 3  . oxymorphone (OPANA) 10 MG tablet Take 10 mg by mouth 2 (two) times daily.     Marland Kitchen senna-docusate (SENOKOT-S) 8.6-50 MG tablet Take 1-2 tablets by mouth daily as needed for mild constipation. 180 tablet 3  . sertraline (ZOLOFT) 100 MG tablet Take 2 tablets (200 mg total) by mouth daily. In am 60 tablet 11  . telmisartan (MICARDIS) 40 MG tablet Take 1 tablet (40 mg total) by mouth daily. At night 90 tablet 3  . umeclidinium-vilanterol (ANORO ELLIPTA) 62.5-25 MCG/INH AEPB Inhale 1 puff into the lungs daily. 60 each 12   No current facility-administered medications for this visit.     PHYSICAL EXAMINATION: ECOG PERFORMANCE STATUS: 1 - Symptomatic but completely ambulatory  GENERAL: alert, no distress and comfortable SKIN: skin color, texture,  turgor are normal, no rashes or significant lesions EYES: normal, Conjunctiva are pink and non-injected, sclera clear OROPHARYNX: no exudate, no erythema and lips, buccal mucosa, and tongue normal  NECK: supple, thyroid normal size, non-tender, without nodularity LYMPH: no palpable lymphadenopathy in the cervical, axillary or inguinal LUNGS: clear to auscultation and percussion with normal breathing effort HEART: regular rate & rhythm and no murmurs and no lower extremity edema ABDOMEN: abdomen soft, non-tender and normal bowel sounds MUSCULOSKELETAL: no cyanosis of digits and no clubbing  NEURO: alert & oriented x 3 with fluent speech, no focal motor/sensory deficits EXTREMITIES: No lower extremity edema BREAST: No palpable masses or nodules in either right or left breasts. No palpable axillary supraclavicular or infraclavicular adenopathy no breast tenderness or nipple discharge. (exam performed in the presence of a chaperone)  LABORATORY DATA:  I have reviewed the data as listed CMP Latest Ref Rng & Units 02/13/2018 02/13/2017 01/25/2017  Glucose 70 - 99 mg/dL 104(H) 100 87  BUN 6 - 23 mg/dL 6 8 10   Creatinine 0.40 - 1.20 mg/dL 0.52 0.67(L) 0.50  Sodium 135 - 145 mEq/L 136 142 135  Potassium 3.5 - 5.1 mEq/L 5.0 3.5 3.9  Chloride 96 - 112 mEq/L 100 104 99  CO2 19 - 32 mEq/L  28 24 29   Calcium 8.4 - 10.5 mg/dL 9.6 9.8 9.2  Total Protein 6.0 - 8.3 g/dL 7.3 7.8 7.4  Total Bilirubin 0.2 - 1.2 mg/dL 0.2 0.7 0.4  Alkaline Phos 39 - 117 U/L 115 103 82  AST 0 - 37 U/L 21 25 18   ALT 0 - 35 U/L 20 22 21     Lab Results  Component Value Date   WBC 7.5 02/13/2018   HGB 13.7 02/13/2018   HCT 41.6 02/13/2018   MCV 95.0 02/13/2018   PLT 511.0 (H) 02/13/2018   NEUTROABS 5.7 02/13/2018    ASSESSMENT & PLAN:  Malignant neoplasm of lower-outer quadrant of left breast of female, estrogen receptor positive ([place]) 04/12/2016 Left breast biopsy/ lumpectomy: IDC grade 10.8 cm with DCIS grade 1, margins  negative, ER 100%, PR 100%, HER-2 negative, Ki-67 15%  PET/CT scan: Negative for metastatic disease Sentinel lymph node study: 1/4 sentinel nodes positive with extracapsular extension  Mammaprint low-risk: 10 year risk of recurrence 10% Adjuvant radiation therapy 07/24/2016- 09/07/2016  Treatment plan: Adjuvant antiestrogen therapy with anastrozole 1 mg daily 7 yearsswitched to letrozole December 2018  Letrozole toxicities:Very occasional hot flashes. Denies any major trouble from this.   Breast cancer surveillance: 1.  Breast exam 08/16/2017: Benign  2. mammogram April 2020: Benign breast density category C 3. CT chest 05/14/2017: Postradiation changes in the anterior left upper lobe  Grieving from trauma of her daughters passing (from OD) Return to clinic in 1 year for follow-up   No orders of the defined types were placed in this encounter.  The patient has a good understanding of the overall plan. she agrees with it. she will call with any problems that may develop before the next visit here.  Nicholas Lose, MD 09/05/2018  Julious Oka Dorshimer am acting as scribe for Dr. Nicholas Lose.  I have reviewed the above documentation for accuracy and completeness, and I agree with the above.

## 2018-09-05 ENCOUNTER — Inpatient Hospital Stay (HOSPITAL_BASED_OUTPATIENT_CLINIC_OR_DEPARTMENT_OTHER): Payer: PPO | Admitting: Hematology and Oncology

## 2018-09-05 DIAGNOSIS — Z79811 Long term (current) use of aromatase inhibitors: Secondary | ICD-10-CM

## 2018-09-05 DIAGNOSIS — Z170 Estrogen receptor positive status [ER+]: Secondary | ICD-10-CM | POA: Diagnosis not present

## 2018-09-05 DIAGNOSIS — Z79899 Other long term (current) drug therapy: Secondary | ICD-10-CM | POA: Diagnosis not present

## 2018-09-05 DIAGNOSIS — C50512 Malignant neoplasm of lower-outer quadrant of left female breast: Secondary | ICD-10-CM

## 2018-09-05 DIAGNOSIS — R232 Flushing: Secondary | ICD-10-CM | POA: Diagnosis not present

## 2018-09-05 DIAGNOSIS — Z923 Personal history of irradiation: Secondary | ICD-10-CM | POA: Diagnosis not present

## 2018-09-05 MED ORDER — LETROZOLE 2.5 MG PO TABS: 2.5000 mg | ORAL_TABLET | Freq: Every day | ORAL | 3 refills | Status: DC

## 2018-09-05 NOTE — Assessment & Plan Note (Signed)
04/12/2016 Left breast biopsy/ lumpectomy: IDC grade 10.8 cm with DCIS grade 1, margins negative, ER 100%, PR 100%, HER-2 negative, Ki-67 15%  PET/CT scan: Negative for metastatic disease Sentinel lymph node study: 1/4 sentinel nodes positive with extracapsular extension  Mammaprint low-risk: 10 year risk of recurrence 10% Adjuvant radiation therapy 07/24/2016- 09/07/2016  Treatment plan: Adjuvant antiestrogen therapy with anastrozole 1 mg daily 7 yearsswitched to letrozole December 2018  Letrozole toxicities:Very occasional hot flashes. Denies any major trouble from this.   Breast cancer surveillance: 1.  Breast exam 08/16/2017: Benign  2. mammogram April 2020: Benign breast density category C 3. CT chest 05/14/2017: Postradiation changes in the anterior left upper lobe  Return to clinic in 1 year for follow-up

## 2018-09-08 ENCOUNTER — Telehealth: Payer: PPO

## 2018-09-08 ENCOUNTER — Ambulatory Visit: Payer: Self-pay | Admitting: Pharmacist

## 2018-09-08 NOTE — Chronic Care Management (AMB) (Signed)
  Chronic Care Management   Note  09/08/2018 Name: Ashley Cross MRN: [id number] DOB: [birth year]  Ashley Cross is a 63 y.o. year old female who is a primary care patient of McLean-Scocuzza, Nino Glow, MD. The CCM team was consulted for assistance with chronic disease management and care coordination needs.    Attempted to contact patient today to discuss her approval for [place] patient assistance program. Left HIPAA compliant message asking patient to return my call at her convenience.   Follow up plan: - Will outreach patient in 2-4 weeks for continued medication management support  [name], PharmD, [place] Pharmacist [place] [phone]

## 2018-09-09 ENCOUNTER — Ambulatory Visit: Payer: Self-pay | Admitting: Pharmacist

## 2018-09-09 ENCOUNTER — Telehealth: Payer: Self-pay

## 2018-09-09 DIAGNOSIS — Z720 Tobacco use: Secondary | ICD-10-CM

## 2018-09-09 DIAGNOSIS — J449 Chronic obstructive pulmonary disease, unspecified: Secondary | ICD-10-CM

## 2018-09-09 NOTE — Telephone Encounter (Signed)
Copied from [place] [phone]. Topic: Referral - Status >> Sep 09, 2018  4:46 PM Simone Curia D wrote: 10/10/720 Patient returned my call she has filled out her application for SNAP and will drop if off this week.  She has contacted a grief support group and will be attending. There is a local [place]  near her home. Closing out referral patient has no other needs at this time. Ambrose Mantle [phone]

## 2018-09-09 NOTE — Telephone Encounter (Signed)
Copied from [place] [phone]. Topic: Referral - Status >> Sep 09, 2018 27:61 AM Simone Curia D wrote: 09/09/8590 Left message on voicemail to follow-up with patient regarding resources given for community food banks, grief support groups and DSS SNAP benefits. Ambrose Mantle [phone]

## 2018-09-09 NOTE — Chronic Care Management (AMB) (Signed)
  Chronic Care Management   Follow Up Note   09/09/2018 Name: Ashley Cross MRN: [id number] DOB: [birth year]  Referred by: Ashley Cross, Ashley Glow, MD Reason for referral : Chronic Care Management (Medication Management)   Ashley Cross is a 63 y.o. year old female who is a primary care patient of Ashley Cross, Ashley Glow, MD. The CCM team was consulted for assistance with chronic disease management and care coordination needs.    Received call from patient today regarding medication management.   Review of patient status, including review of consultants reports, relevant laboratory and other test results, and collaboration with appropriate care team members and the patient's provider was performed as part of comprehensive patient evaluation and provision of chronic care management services.    Goals Addressed            This Visit's Progress     Patient Stated   . "I can't afford my medications" (pt-stated)       Current Barriers:  . Financial barriers - patient notes that her copayments for Anoro and Ventolin are extremely expensive, and she is unable to afford these in combination with her other medications o COPD, smoking 1/2-2/3 ppd  . Has connected with community resources through [employer] team, including food stamps, food resources, and grief support . Received message from Susy Frizzle - patient APPROVED for Anoro and Ventolin assistance through 02/05/2019. Should arrive in the mail to her home in the next ~7-10 business days. Marland Kitchen Applied for Medicare Extra Help/LIS - this determination generally takes 4-6 weeks and will arrive to her mail  Pharmacist Clinical Goal(s):  Marland Kitchen Over the next 90 days, patient will work with PharmD and primary care provider to address needs related to optimized medication management  Interventions: . Informed patient of approval news, she was ecstatic. Informed her that [facility] would reach out in the next 10-15 business days to ensure  she received her medications. . Informed to keep an eye out for letter from [facility] with Extra Help determination . Discussed tobacco cessation. Patient notes that with the anniversary of her daughter's death coming up next month, this is not a good time to pursue tobacco cessation, but would like to do so in the future. Notes she was previously successful with using NRT gum.  Patient Self Care Activities:  . Self administers medications as prescribed . Calls pharmacy for medication refills  Please see past updates related to this goal by clicking on the "Past Updates" button in the selected goal         Plan:  - Will outreach patient in 3-4 weeks for continued medication management support  [name], PharmD, [place] Pharmacist [employer] [phone]

## 2018-09-09 NOTE — Patient Instructions (Signed)
Visit Information  Goals Addressed            This Visit's Progress     Patient Stated   . "I can't afford my medications" (pt-stated)       Current Barriers:  . Financial barriers - patient notes that her copayments for Anoro and Ventolin are extremely expensive, and she is unable to afford these in combination with her other medications o COPD, smoking 1/2-2/3 ppd  . Has connected with community resources through [employer] team, including food stamps, food resources, and grief support . Received message from Susy Frizzle - patient APPROVED for Anoro and Ventolin assistance through 02/05/2019. Should arrive in the mail to her home in the next ~7-10 business days. Marland Kitchen Applied for Medicare Extra Help/LIS - this determination generally takes 4-6 weeks and will arrive to her mail  Pharmacist Clinical Goal(s):  Marland Kitchen Over the next 90 days, patient will work with PharmD and primary care provider to address needs related to optimized medication management  Interventions: . Informed patient of approval news, she was ecstatic. Informed her that [place] would reach out in the next 10-15 business days to ensure she received her medications. . Informed to keep an eye out for letter from [facility] with Extra Help determination . Discussed tobacco cessation. Patient notes that with the anniversary of her daughter's death coming up next month, this is not a good time to pursue tobacco cessation, but would like to do so in the future. Notes she was previously successful with using NRT gum.  Patient Self Care Activities:  . Self administers medications as prescribed . Calls pharmacy for medication refills  Please see past updates related to this goal by clicking on the "Past Updates" button in the selected goal         The patient verbalized understanding of instructions provided today and declined a print copy of patient instruction materials.   Plan:  - Will outreach patient in 3-4 weeks for continued  medication management support  [name], PharmD, [place] Pharmacist [facility] [place] [phone]

## 2018-09-16 ENCOUNTER — Encounter: Payer: Self-pay | Admitting: Psychiatry

## 2018-09-16 ENCOUNTER — Ambulatory Visit (INDEPENDENT_AMBULATORY_CARE_PROVIDER_SITE_OTHER): Payer: PPO | Admitting: Psychiatry

## 2018-09-16 ENCOUNTER — Other Ambulatory Visit: Payer: Self-pay

## 2018-09-16 DIAGNOSIS — F17200 Nicotine dependence, unspecified, uncomplicated: Secondary | ICD-10-CM

## 2018-09-16 DIAGNOSIS — F1010 Alcohol abuse, uncomplicated: Secondary | ICD-10-CM | POA: Diagnosis not present

## 2018-09-16 DIAGNOSIS — F331 Major depressive disorder, recurrent, moderate: Secondary | ICD-10-CM | POA: Diagnosis not present

## 2018-09-16 MED ORDER — HYDROXYZINE PAMOATE 25 MG PO CAPS: 25.0000 mg | ORAL_CAPSULE | Freq: Every day | ORAL | 1 refills | Status: DC | PRN

## 2018-09-16 MED ORDER — DULOXETINE HCL 30 MG PO CPEP: 30.0000 mg | ORAL_CAPSULE | Freq: Every day | ORAL | 1 refills | Status: DC
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
## 2018-09-19 ENCOUNTER — Ambulatory Visit: Payer: Self-pay | Admitting: Pharmacist

## 2018-09-19 ENCOUNTER — Other Ambulatory Visit: Payer: Self-pay | Admitting: Pharmacy Technician

## 2018-09-19 NOTE — Patient Outreach (Signed)
[facility]) Care Management  09/19/2018  Keylah Darwish 1956/01/10 [id number]   Successful outreach call placed to patient in regards to [place] application for Anoro and Ventolin.  Spoke to patient, HIPAA identifiers verified.  Patient informed she had received 3 inhalers of each kind. Discussed refill procedure with patient and advised refilling the medications before the end of the year as patient will have to reapply and meet the $600 out of pocket requirement again in 2021. Patient verbalized understanding.  Patient informed she received a letter from someone stating that her medication copay(s) were going to be reduce. She informed she thought the letter was from her insurance company but she was not sure. She informed she did not think it was concerning LIS from social security.  Patient was very thankful and appreciative of our team's work in helping her afford her medications.  Will route note to embedded [facility] RPh [name] that it appears patient may have been approved for LIS and that patient assistance has been completed. Will remove myself from care team since patient assistance completed.  Marvelle Span P. Cerina Leary, [place] Management [phone]

## 2018-09-19 NOTE — Chronic Care Management (AMB) (Signed)
  Chronic Care Management   Note  09/19/2018 Name: Ashley Cross MRN: [id number] DOB: [birth year]  Ashley Cross is a 63 y.o. year old female who is a primary care patient of McLean-Scocuzza, Nino Glow, MD. The CCM team was consulted for assistance with chronic disease management and care coordination needs.    Received message from [facility], CPhT that patient may have received notice of approval for Medicare Extra Help/LIS. I contacted patient, asked that she bring that letter to the office for [place] to keep a copy on file. She expressed understanding. She notes that she had an appointment with the psychiatrist last week, and a therapist this coming week.   Follow up plan: - Will outreach patient next month as previously scheduled.   [name], PharmD, [place] Pharmacist [facility] [place] [phone]

## 2018-09-22 ENCOUNTER — Telehealth: Payer: PPO

## 2018-09-23 ENCOUNTER — Telehealth: Payer: Self-pay

## 2018-09-23 DIAGNOSIS — F331 Major depressive disorder, recurrent, moderate: Secondary | ICD-10-CM

## 2018-09-23 MED ORDER — DULOXETINE HCL 60 MG PO CPEP: 60.0000 mg | ORAL_CAPSULE | Freq: Every day | ORAL | 1 refills | Status: DC

## 2018-09-23 NOTE — Telephone Encounter (Signed)
pt called left message that she needs her cymbalta increased states that you guys spoke about last time you talked.

## 2018-09-23 NOTE — Telephone Encounter (Signed)
Sent cymbalta to pharmacy with dosage increase to 60 mg

## 2018-09-24 ENCOUNTER — Ambulatory Visit (INDEPENDENT_AMBULATORY_CARE_PROVIDER_SITE_OTHER): Payer: PPO | Admitting: Licensed Clinical Social Worker

## 2018-09-24 ENCOUNTER — Encounter: Payer: Self-pay | Admitting: Licensed Clinical Social Worker

## 2018-09-24 ENCOUNTER — Other Ambulatory Visit: Payer: Self-pay

## 2018-09-24 DIAGNOSIS — F1010 Alcohol abuse, uncomplicated: Secondary | ICD-10-CM | POA: Diagnosis not present

## 2018-09-24 DIAGNOSIS — F331 Major depressive disorder, recurrent, moderate: Secondary | ICD-10-CM | POA: Diagnosis not present
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
## 2018-09-25 NOTE — Telephone Encounter (Signed)
Pt was called and she understand to take 60mg  of cymbalta. And pt states she spoke with [facility] and she feels so much better and that today she got a lot of stuff done around her house and she felt better today than she had in a long time.

## 2018-09-30 DIAGNOSIS — R490 Dysphonia: Secondary | ICD-10-CM | POA: Diagnosis not present

## 2018-09-30 DIAGNOSIS — B379 Candidiasis, unspecified: Secondary | ICD-10-CM | POA: Diagnosis not present

## 2018-10-16 ENCOUNTER — Telehealth: Payer: PPO

## 2018-10-16 ENCOUNTER — Ambulatory Visit: Payer: Self-pay | Admitting: Pharmacist

## 2018-10-16 NOTE — Chronic Care Management (AMB) (Signed)
  Chronic Care Management   Note  10/16/2018 Name: Ashley Cross MRN: [id number] DOB: [birth year]  Ashley Cross is a 63 y.o. year old female who is a primary care patient of McLean-Scocuzza, Nino Glow, MD. The CCM team was consulted for assistance with chronic disease management and care coordination needs.    Attempted to contact patient for medication management follow up; left HIPAA compliant message for patient to return my call at her convenience.   Follow up plan: - If I do not hear back, will outreach again in the next 2-3 weeks  [name], PharmD, [place] Pharmacist [place] [phone]

## 2018-10-20 ENCOUNTER — Encounter: Payer: Self-pay | Admitting: Psychiatry

## 2018-10-20 ENCOUNTER — Ambulatory Visit (INDEPENDENT_AMBULATORY_CARE_PROVIDER_SITE_OTHER): Payer: PPO | Admitting: Psychiatry

## 2018-10-20 ENCOUNTER — Other Ambulatory Visit: Payer: Self-pay

## 2018-10-20 DIAGNOSIS — F1010 Alcohol abuse, uncomplicated: Secondary | ICD-10-CM | POA: Diagnosis not present

## 2018-10-20 DIAGNOSIS — F331 Major depressive disorder, recurrent, moderate: Secondary | ICD-10-CM | POA: Diagnosis not present

## 2018-10-20 DIAGNOSIS — M961 Postlaminectomy syndrome, not elsewhere classified: Secondary | ICD-10-CM | POA: Insufficient documentation

## 2018-10-20 DIAGNOSIS — F17200 Nicotine dependence, unspecified, uncomplicated: Secondary | ICD-10-CM | POA: Diagnosis not present

## 2018-10-20 MED ORDER — HYDROXYZINE PAMOATE 25 MG PO CAPS: 25.0000 mg | ORAL_CAPSULE | Freq: Every evening | ORAL | 1 refills | Status: DC | PRN

## 2018-10-20 MED ORDER — ESZOPICLONE 1 MG PO TABS: 1.0000 mg | ORAL_TABLET | Freq: Every day | ORAL | 1 refills | Status: DC

## 2018-10-20 NOTE — Progress Notes (Signed)
Virtual Visit via Video Note  I connected with Ashley Cross on 10/20/18 at  3:00 PM EDT by a video enabled telemedicine application and verified that I am speaking with the correct person using two identifiers.   I discussed the limitations of evaluation and management by telemedicine and the availability of in person appointments. The patient expressed understanding and agreed to proceed.    I discussed the assessment and treatment plan with the patient. The patient was provided an opportunity to ask questions and all were answered. The patient agreed with the plan and demonstrated an understanding of the instructions.   The patient was advised to call back or seek an in-person evaluation if the symptoms worsen or if the condition fails to improve as anticipated.   [facility] MD OP Progress Note  10/20/2018 4:57 PM Ashley Cross  MRN:  [id number]  Chief Complaint:  Chief Complaint    Follow-up     HPI: Ashley Cross is a 63 year old Caucasian female, on disability, lives in [place], has a history of MDD, alcohol use disorder, history of breast cancer, chronic pain, hypertension was evaluated by telemedicine today.  A video call was initiated however due to connection problem it had to be changed to a phone call.  Patient today reports she continues to struggle with sleep.  She reports the hydroxyzine is not very helpful with her sleep.  Patient reports that Cymbalta does help her with her mood symptoms.  She is tolerating the higher dosage well.  Patient denies any suicidality, homicidality or perceptual disturbances.  Patient reports she is currently in psychotherapy sessions with Ms. [name] and it is helpful.  Patient reports the anniversary of her daughter's death is coming up and she is currently having some mood symptoms.  She however reports she is reaching out to her support system.  Her brother has been helpful.  She also has a friend however they have been having  some relationship struggles recently.  She however is working on the same.  Patient reports she has been drinking up to 3 alcoholic drinks per day.  Advised her not to combine alcohol with her medications.  She will continue to cut back.  Patient denies any other concerns today.   Visit Diagnosis:    ICD-10-CM   1. MDD (major depressive disorder), recurrent episode, moderate (HCC)  F33.1 hydrOXYzine (VISTARIL) 25 MG capsule    eszopiclone (LUNESTA) 1 MG TABS tablet  2. Alcohol use disorder, mild, abuse  F10.10   3. Tobacco use disorder  F17.200     Past Psychiatric History: I have reviewed past psychiatric history from my progress note on 09/16/2018.  Past trials of Zoloft, Wellbutrin, Lexapro, Celexa  Past Medical History:  Past Medical History:  Diagnosis Date  . Anxiety   . Breast cancer ([place])    Left breast(nipple mass) 2018  . Cervical stenosis of spine   . Chicken pox   . Chronic back pain   . Colon polyps   . COPD (chronic obstructive pulmonary disease) ([place])   . Cyst of left nipple   . Depression   . Hypertension   . Insomnia   . Personal history of radiation therapy     Past Surgical History:  Procedure Laterality Date  . BACK SURGERY     x 3 (2010 x 2; 2013 x 1)   . BREAST BIOPSY Left    core- neg  . BREAST LUMPECTOMY Left 2018  . BREAST SURGERY Left    breast  biopsy  . CERVICAL CONE BIOPSY    . CERVICAL FUSION     times 2  . COLONOSCOPY WITH PROPOFOL N/A 04/18/2015   Procedure: COLONOSCOPY WITH PROPOFOL;  Surgeon: [name], MD;  Location: [facility] ENDOSCOPY;  Service: Endoscopy;  Laterality: N/A;  . ELBOW SURGERY     2014 b/l elbows per pt ulnar nerve   . ELBOW SURGERY     x2   . EPIDURAL BLOCK INJECTION     Dr. Maryjean Ka   . MASS EXCISION Left 04/12/2016   Procedure: EXCISION OF LEFT NIPPLE MASS;  Surgeon: Stark Klein, MD;  Location: [facility];  Service: General;  Laterality: Left;  . NECK SURGERY    . POSTERIOR CERVICAL  FUSION/FORAMINOTOMY  03/07/2011   Procedure: POSTERIOR CERVICAL FUSION/FORAMINOTOMY LEVEL 4;  Surgeon: Eustace Moore, MD;  Location: [facility] NEURO ORS;  Service: Neurosurgery;  Laterality: N/A;  cervical three to thoracic one posterior cervical fusion   . SENTINEL NODE BIOPSY Left 05/29/2016   Procedure: LEFT SENTINEL LYMPH NODE BIOPSY;  Surgeon: Stark Klein, MD;  Location: [facility];  Service: General;  Laterality: Left;  . TONSILLECTOMY      Family Psychiatric History: I have reviewed family psychiatric history from my progress note on 09/16/2018  Family History:  Family History  Problem Relation Age of Onset  . Breast cancer Mother 50  . Hypertension Mother   . Cancer Mother        breast dx'ed 43   . Cancer Father        prostate  . Hypertension Father   . Drug abuse Daughter   . Hypertension Maternal Grandmother   . Breast cancer Cousin     Social History: I have reviewed social history from my progress note on 09/16/2018 Social History   Socioeconomic History  . Marital status: Divorced    Spouse name: Not on file  . Number of children: 2  . Years of education: Not on file  . Highest education level: Not on file  Occupational History  . Not on file  Social Needs  . Financial resource strain: Not on file  . Food insecurity    Worry: Not on file    Inability: Not on file  . Transportation needs    Medical: Not on file    Non-medical: Not on file  Tobacco Use  . Smoking status: Current Every Day Smoker    Packs/day: 1.00    Years: 30.00    Pack years: 30.00    Types: Cigarettes  . Smokeless tobacco: Never Used  Substance and Sexual Activity  . Alcohol use: Yes    Comment: social  . Drug use: No    Comment: on opana since jan 2018  . Sexual activity: Not on file  Lifestyle  . Physical activity    Days per week: Not on file    Minutes per session: Not on file  . Stress: Not on file  Relationships  . Social Herbalist on phone: Not on file    Gets  together: Not on file    Attends religious service: Not on file    Active member of club or organization: Not on file    Attends meetings of clubs or organizations: Not on file    Relationship status: Not on file  Other Topics Concern  . Not on file  Social History Narrative   As of 01/25/17 not sexually active of in relationship    Divorced  2 kids son and daughter (though daughter died of suicide in 05-05-2017)   [place] has 3 dogs            Allergies:  Allergies  Allergen Reactions  . Sulfa Antibiotics Itching and Rash  . Sulfonamide Derivatives Itching and Rash    Metabolic Disorder Labs: Lab Results  Component Value Date   HGBA1C 6.1 02/13/2018   No results found for: PROLACTIN Lab Results  Component Value Date   CHOL 178 02/13/2018   TRIG 86.0 02/13/2018   HDL 80.60 02/13/2018   CHOLHDL 2 02/13/2018   VLDL 17.2 02/13/2018   LDLCALC 81 02/13/2018   LDLCALC 59 01/25/2017   Lab Results  Component Value Date   TSH 2.97 02/13/2018   TSH 0.95 01/25/2017    Therapeutic Level Labs: No results found for: LITHIUM No results found for: VALPROATE No components found for:  CBMZ  Current Medications: Current Outpatient Medications  Medication Sig Dispense Refill  . albuterol (VENTOLIN HFA) 108 (90 Base) MCG/ACT inhaler Inhale 1-2 puffs into the lungs every 4 (four) hours as needed for wheezing or shortness of breath. 18 g 12  . azelastine (ASTELIN) 0.1 % nasal spray     . benzonatate (TESSALON) 200 MG capsule Take 1 capsule (200 mg total) by mouth 3 (three) times daily as needed for cough. 40 capsule 0  . buPROPion (WELLBUTRIN XL) 300 MG 24 hr tablet Take 1 tablet (300 mg total) by mouth daily. 90 tablet 3  . Cholecalciferol (D3 ADULT PO) Take by mouth. 10000 IU weekly    . cyanocobalamin 100 MCG tablet Take 100 mcg by mouth daily.    . diazepam (VALIUM) 5 MG tablet Take 1 tablet (5 mg total) by mouth at bedtime as needed for muscle spasms or  anxiety. 30 tablet 5  . doxycycline (VIBRA-TABS) 100 MG tablet Take 1 tablet (100 mg total) by mouth 2 (two) times daily. With food 14 tablet 0  . DULoxetine (CYMBALTA) 60 MG capsule Take 1 capsule (60 mg total) by mouth daily. 30 capsule 1  . eszopiclone (LUNESTA) 1 MG TABS tablet Take 1 tablet (1 mg total) by mouth at bedtime. Take immediately before bedtime 30 tablet 1  . fluticasone (FLONASE) 50 MCG/ACT nasal spray Place 2 sprays into both nostrils daily. Max 2 sprays 16 g 11  . gabapentin (NEURONTIN) 600 MG tablet Take 1,200 mg by mouth 3 (three) times daily.    . hydrochlorothiazide (HYDRODIURIL) 12.5 MG tablet Take 1 tablet (12.5 mg total) by mouth daily. In am 90 tablet 3  . hydrOXYzine (VISTARIL) 25 MG capsule Take 1-2 capsules (25-50 mg total) by mouth at bedtime as needed. For severe anxiety 60 capsule 1  . ipratropium (ATROVENT) 0.06 % nasal spray Place 2 sprays into both nostrils 3 (three) times daily. prn 15 mL 12  . letrozole (FEMARA) 2.5 MG tablet Take 1 tablet (2.5 mg total) by mouth daily. 90 tablet 3  . loratadine (CLARITIN) 10 MG tablet Take 1 tablet (10 mg total) by mouth daily as needed for allergies. At night (Patient not taking: Reported on 08/19/2018) 90 tablet 3  . methylPREDNISolone (MEDROL DOSEPAK) 4 MG TBPK tablet     . montelukast (SINGULAIR) 10 MG tablet Take 1 tablet (10 mg total) by mouth at bedtime. 90 tablet 3  . Multiple Vitamins-Minerals (MULTIVITAMIN) tablet Take 1 tablet by mouth daily. 90 tablet 3  . omeprazole (PRILOSEC) 20 MG capsule     .  oxymorphone (OPANA) 10 MG tablet Take 10 mg by mouth 2 (two) times daily.     Marland Kitchen senna-docusate (SENOKOT-S) 8.6-50 MG tablet Take 1-2 tablets by mouth daily as needed for mild constipation. 180 tablet 3  . telmisartan (MICARDIS) 40 MG tablet Take 1 tablet (40 mg total) by mouth daily. At night 90 tablet 3  . umeclidinium-vilanterol (ANORO ELLIPTA) 62.5-25 MCG/INH AEPB Inhale 1 puff into the lungs daily. 60 each 12   No  current facility-administered medications for this visit.      Musculoskeletal: Strength & Muscle Tone: UTA Gait & Station: Reports as WNL Patient leans: N/A  Psychiatric Specialty Exam: Review of Systems  Psychiatric/Behavioral: Positive for depression. The patient is nervous/anxious and has insomnia.   All other systems reviewed and are negative.   There were no vitals taken for this visit.There is no height or weight on file to calculate BMI.  General Appearance: UTA  Eye Contact:  UTA  Speech:  Clear and Coherent  Volume:  Normal  Mood:  Anxious and Depressed  Affect:  UTA  Thought Process:  Goal Directed and Descriptions of Associations: Intact  Orientation:  Full (Time, Place, and Person)  Thought Content: Logical   Suicidal Thoughts:  No  Homicidal Thoughts:  No  Memory:  Immediate;   Fair Recent;   Fair Remote;   Fair  Judgement:  Fair  Insight:  Fair  Psychomotor Activity:  UTA  Concentration:  Concentration: Fair and Attention Span: Fair  Recall:  [employer] of Knowledge: Fair  Language: Fair  Akathisia:  No  Handed:  Right  AIMS (if indicated): denies tremors, rigidity  Assets:  Communication Skills Desire for Improvement Housing Social Support  ADL's:  Intact  Cognition: WNL  Sleep:  Poor   Screenings: GAD-7     Office Visit from 09/30/2017 in [facility]  Total GAD-7 Score  18    PHQ2-9     Office Visit from 09/16/2018 in [facility] Visit from 09/30/2017 in [facility] Office Visit from 08/19/2017 in [facility] from 06/13/2016 in [facility]  PHQ-2 Total Score  0  4  4  1   PHQ-9 Total Score  -  18  16  -       Assessment and Plan: Delylah is a 63 year old Caucasian female, divorced, on disability, has a history of depression, chronic pain, hypertension, history of breast cancer was evaluated by telemedicine  today.  Patient is biologically predisposed given her family history.  She also has psychosocial stressors of her own health issues including pain, death of her daughter a year ago.  Patient is currently struggling with sleep problems as well as upcoming death anniversary of her daughter.  She will continue to benefit from medication readjustment as well as psychotherapy sessions.  Plan For MDD- improving Cymbalta 60 mg p.o. daily Wellbutrin XL 300 mg p.o. daily Hydroxyzine 25 mg p.o. daily as needed for anxiety symptoms as well as sleep Add Lunesta 1 mg p.o. nightly for sleep Continue CBT with Ms. [name].  Alcohol use disorder mild-improving Provided substance abuse counseling. She will continue to cut back.  For tobacco use disorder-unstable Provided smoking cessation counseling.  Reviewed TSH-02/13/2018-2.97-within normal limits  Follow-up in clinic in 2 to 3 weeks or sooner if needed.  October 8 at 11 AM  I have spent atleast 25 minutes non face to face with patient today.  More than 50 % of the time was spent for psychoeducation and supportive psychotherapy and care coordination. This note was generated in part or whole with voice recognition software. Voice recognition is usually quite accurate but there are transcription errors that can and very often do occur. I apologize for any typographical errors that were not detected and corrected.       Ursula Alert, MD 10/20/2018, 4:57 PM

## 2018-10-21 ENCOUNTER — Ambulatory Visit (INDEPENDENT_AMBULATORY_CARE_PROVIDER_SITE_OTHER): Payer: PPO | Admitting: Licensed Clinical Social Worker

## 2018-10-21 ENCOUNTER — Other Ambulatory Visit: Payer: Self-pay

## 2018-10-21 ENCOUNTER — Encounter: Payer: Self-pay | Admitting: Licensed Clinical Social Worker

## 2018-10-21 DIAGNOSIS — F331 Major depressive disorder, recurrent, moderate: Secondary | ICD-10-CM

## 2018-10-21 NOTE — Progress Notes (Signed)
Virtual Visit via Video Note  I connected with Ashley Cross on 10/21/18 at 11:00 AM EDT by a video enabled telemedicine application and verified that I am speaking with the correct person using two identifiers.   I discussed the limitations of evaluation and management by telemedicine and the availability of in person appointments. The patient expressed understanding and agreed to proceed.  I discussed the assessment and treatment plan with the patient. The patient was provided an opportunity to ask questions and all were answered. The patient agreed with the plan and demonstrated an understanding of the instructions.   The patient was advised to call back or seek an in-person evaluation if the symptoms worsen or if the condition fails to improve as anticipated.  I provided 60 minutes of non-face-to-face time during this encounter.   [name], LCSW    THERAPIST PROGRESS NOTE  Session Time: 1100 Participation Level: Active  Behavioral Response: CasualAlertDepressed  Type of Therapy: Individual Therapy  Treatment Goals addressed: Coping  Interventions: Supportive  Summary: Ashley Cross is a 63 y.o. female who presents with continued symptoms related to her diagnosis. Ashley Cross reports doing well since our last session. She reports she has felt better, and she believes it is in part due to her Cymbalta being increased, but also due to LCSW telling her it was okay to not be okay. LCSW validated both ideas, and emphasized the importance of allowing yourself to feel whatever it is you're feeling. We discussed the process of grief again, and briefly discussed how to change Mafalda's feelings related to the room she found her daughter in. We discussed every time the memory of finding her daughter on the floor pops up to immediately attempt to replace it with a a more positive memory. We spent time talking about more positive memories, and LCSW held space for Ashley Cross to  reminisce about happy memories. She reported the anniversary of her daughter's death is tomorrow, and she is planning to spend time with her daughter's friend and go through her daughter's room. LCSW encouraged Teniyah to have no expectations for tomorrow, and just do whatever she feels up to doing--and reinforced the idea that it was okay to change her mind. Ashley Cross expressed understanding and agreement.   Suicidal/Homicidal: No  Therapist Response: Ashley Cross continues to work towards her tx goals but has not yet reached them. We will continue to work on processing grief and regulating emotions in the moment.   Plan: Return again in 1 weeks.  Diagnosis: Axis I: MDD    Axis II: No diagnosis    [name], LCSW 10/21/2018

## 2018-10-28 ENCOUNTER — Other Ambulatory Visit: Payer: Self-pay

## 2018-10-28 ENCOUNTER — Encounter: Payer: Self-pay | Admitting: Licensed Clinical Social Worker

## 2018-10-28 ENCOUNTER — Ambulatory Visit (INDEPENDENT_AMBULATORY_CARE_PROVIDER_SITE_OTHER): Payer: PPO | Admitting: Licensed Clinical Social Worker

## 2018-10-28 DIAGNOSIS — F331 Major depressive disorder, recurrent, moderate: Secondary | ICD-10-CM

## 2018-10-28 NOTE — Progress Notes (Signed)
Virtual Visit via Video Note  I connected with Ashley Cross on 10/28/18 at 11:00 AM EDT by a video enabled telemedicine application and verified that I am speaking with the correct person using two identifiers.   I discussed the limitations of evaluation and management by telemedicine and the availability of in person appointments. The patient expressed understanding and agreed to proceed.   I discussed the assessment and treatment plan with the patient. The patient was provided an opportunity to ask questions and all were answered. The patient agreed with the plan and demonstrated an understanding of the instructions.   The patient was advised to call back or seek an in-person evaluation if the symptoms worsen or if the condition fails to improve as anticipated.  I provided 50 minutes of non-face-to-face time during this encounter.   [name], LCSW   THERAPIST PROGRESS NOTE  Session Time: 1100  Participation Level: Active  Behavioral Response: CasualAlerthopeful  Type of Therapy: Individual Therapy  Treatment Goals addressed: Coping  Interventions: Supportive  Summary: Ashley Cross is a 63 y.o. female who presents with continued symptoms related to her diagnosis. Viann reports doing well since our last session. She reported waking up today and being excited for her session, "because I couldn't wait to tell you how well I did with my daughter's room." She reported she and her daughter's friend were able to clean out her daughter's room, and she was able to get through the experience "without falling apart." LCSW validated and celebrated Dawsyn's feelings around this topic with her. LCSW then held space for Nateesha to discuss her experience and discuss fond memories of her daughter. She stated, throughout the process, she was able to remember more positive memories in order to replace the negative memory that took place in her room. LCSW validated this practice and  encouraged Jenitza to continue the process. Iyonia expressed understanding and agreement. 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 LCSW encouraged Myleigh to weigh her options, and if she feels she is able to, take the courses she needs to keep certification so at least returning to work is an option if she wants it. Christmas expressed agreement, "I just don't know if I'm ready yet." LCSW validated that idea and reinforced she doesn't have to go back if she's not ready.   Suicidal/Homicidal: No   Therapist Response: Janny continues to work towards her tx goals but has not yet reached them. We will continue to work on emotional regulation skills and processing grief.   Plan: Return again in 2 weeks.  Diagnosis: Axis I: MDD    Axis II: No diagnosis    [name], LCSW 10/28/2018

## 2018-10-29 ENCOUNTER — Ambulatory Visit: Payer: PPO | Admitting: Internal Medicine

## 2018-11-06 ENCOUNTER — Telehealth: Payer: PPO

## 2018-11-06 ENCOUNTER — Ambulatory Visit (INDEPENDENT_AMBULATORY_CARE_PROVIDER_SITE_OTHER): Payer: PPO | Admitting: Pharmacist

## 2018-11-06 DIAGNOSIS — F339 Major depressive disorder, recurrent, unspecified: Secondary | ICD-10-CM

## 2018-11-06 DIAGNOSIS — J439 Emphysema, unspecified: Secondary | ICD-10-CM

## 2018-11-06 NOTE — Chronic Care Management (AMB) (Signed)
  Chronic Care Management   Note  11/06/2018 Name: Ashley Cross MRN: [id number] DOB: [birth year]  Ashley Cross is a 63 y.o. year old female who is a primary care patient of McLean-Scocuzza, Nino Glow, MD. The CCM team was consulted for assistance with chronic disease management and care coordination needs.    Attempted to contact patient for medication management support. Left HIPAA compliant message for patient to return my call at her convenience.   Follow up plan: - If I do not hear back, will attempt outreach again in the next 1-2 weeks  [name], PharmD, [facility] Pharmacist [facility] [phone]

## 2018-11-06 NOTE — Patient Instructions (Signed)
Visit Information  Goals Addressed            This Visit's Progress     Patient Stated   . "I can't afford my medications" (pt-stated)       Current Barriers:  . Financial barriers - medication related barriers have been resolved; patient was approved for Medicare Extra Help; all brand copays are $8.95/90 days and generic are $3.60 or less. Is incredibly thankful for these reduced costs.  . Notes that the 1 year anniversary of her daughter's death was a few weeks ago, and she feels she is doing OK. Continues to see [employer] and a grief counselor.  . However, reports that she still has no money left over after paying bills, and is having to continue to build debt on her credit card. Is concerned about the long term effects of this. Worries that her physical disabilities prevent her from being able to have gainful employment.  Pharmacist Clinical Goal(s):  Marland Kitchen Over the next 90 days, patient will work with PharmD and primary care provider to address needs related to optimized medication management  Interventions: . Provided empathetic listening as patient spoke about her grief and healing. She notes that she was finally able to open the door to her daughter's room, and has started the process of cleaning out the room to change into an exercise room or her home office.  Ronne Binning counselor LCSW [name] to see if she can provide any SW financial resource services. Will reach back out to [place] to see how else she might be able to support patient. Will investigate other avenues to get SW involved for financial support resources  Patient Self Care Activities:  . Self administers medications as prescribed . Calls pharmacy for medication refills  Please see past updates related to this goal by clicking on the "Past Updates" button in the selected goal         The patient verbalized understanding of instructions provided today and declined a print copy of patient  instruction materials.    Plan:  - Will continue to work with the multidiscplinary team to support this patient.   [name], PharmD, [facility] Pharmacist [facility] [place] [phone]

## 2018-11-06 NOTE — Chronic Care Management (AMB) (Signed)
Chronic Care Management   Follow Up Note   11/06/2018 Name: Ashley Cross MRN: [id number] DOB: 07-20-1955  Referred by: McLean-Scocuzza, Nino Glow, MD Reason for referral : Chronic Care Management (Medication Management)   Ashley Cross is a 63 y.o. year old female who is a primary care patient of McLean-Scocuzza, Nino Glow, MD. The CCM team was consulted for assistance with chronic disease management and care coordination needs.    Received call back from patient.   Review of patient status, including review of consultants reports, relevant laboratory and other test results, and collaboration with appropriate care team members and the patient's provider was performed as part of comprehensive patient evaluation and provision of chronic care management services.    SDOH (Social Determinants of Health) screening performed today: Financial Strain  Depression  . See Care Plan for related entries.   Advanced Directives Status: N See Care Plan and Vynca application for related entries.  Outpatient Encounter Medications as of 11/06/2018  Medication Sig  . albuterol (VENTOLIN HFA) 108 (90 Base) MCG/ACT inhaler Inhale 1-2 puffs into the lungs every 4 (four) hours as needed for wheezing or shortness of breath.  Marland Kitchen azelastine (ASTELIN) 0.1 % nasal spray   . benzonatate (TESSALON) 200 MG capsule Take 1 capsule (200 mg total) by mouth 3 (three) times daily as needed for cough.  Marland Kitchen buPROPion (WELLBUTRIN XL) 300 MG 24 hr tablet Take 1 tablet (300 mg total) by mouth daily.  . Cholecalciferol (D3 ADULT PO) Take by mouth. 10000 IU weekly  . cyanocobalamin 100 MCG tablet Take 100 mcg by mouth daily.  . diazepam (VALIUM) 5 MG tablet Take 1 tablet (5 mg total) by mouth at bedtime as needed for muscle spasms or anxiety.  Marland Kitchen doxycycline (VIBRA-TABS) 100 MG tablet Take 1 tablet (100 mg total) by mouth 2 (two) times daily. With food  . DULoxetine (CYMBALTA) 60 MG capsule Take 1 capsule (60 mg total) by  mouth daily.  . eszopiclone (LUNESTA) 1 MG TABS tablet Take 1 tablet (1 mg total) by mouth at bedtime. Take immediately before bedtime  . fluticasone (FLONASE) 50 MCG/ACT nasal spray Place 2 sprays into both nostrils daily. Max 2 sprays  . gabapentin (NEURONTIN) 600 MG tablet Take 1,200 mg by mouth 3 (three) times daily.  . hydrochlorothiazide (HYDRODIURIL) 12.5 MG tablet Take 1 tablet (12.5 mg total) by mouth daily. In am  . hydrOXYzine (VISTARIL) 25 MG capsule Take 1-2 capsules (25-50 mg total) by mouth at bedtime as needed. For severe anxiety  . ipratropium (ATROVENT) 0.06 % nasal spray Place 2 sprays into both nostrils 3 (three) times daily. prn  . letrozole (FEMARA) 2.5 MG tablet Take 1 tablet (2.5 mg total) by mouth daily.  Marland Kitchen loratadine (CLARITIN) 10 MG tablet Take 1 tablet (10 mg total) by mouth daily as needed for allergies. At night (Patient not taking: Reported on 08/19/2018)  . methylPREDNISolone (MEDROL DOSEPAK) 4 MG TBPK tablet   . montelukast (SINGULAIR) 10 MG tablet Take 1 tablet (10 mg total) by mouth at bedtime.  . Multiple Vitamins-Minerals (MULTIVITAMIN) tablet Take 1 tablet by mouth daily.  Marland Kitchen omeprazole (PRILOSEC) 20 MG capsule   . oxymorphone (OPANA) 10 MG tablet Take 10 mg by mouth 2 (two) times daily.   Marland Kitchen senna-docusate (SENOKOT-S) 8.6-50 MG tablet Take 1-2 tablets by mouth daily as needed for mild constipation.  Marland Kitchen telmisartan (MICARDIS) 40 MG tablet Take 1 tablet (40 mg total) by mouth daily. At night  . umeclidinium-vilanterol [facility]  ELLIPTA) 62.5-25 MCG/INH AEPB Inhale 1 puff into the lungs daily.   No facility-administered encounter medications on file as of 11/06/2018.      Goals Addressed            This Visit's Progress     Patient Stated   . "I can't afford my medications" (pt-stated)       Current Barriers:  . Financial barriers - medication related barriers have been resolved; patient was approved for Medicare Extra Help; all brand copays are $8.95/90  days and generic are $3.60 or less. Is incredibly thankful for these reduced costs.  . Notes that the 1 year anniversary of her daughter's death was a few weeks ago, and she feels she is doing OK. Continues to see [employer] and a grief counselor.  . However, reports that she still has no money left over after paying bills, and is having to continue to build debt on her credit card. Is concerned about the long term effects of this. Worries that her physical disabilities prevent her from being able to have gainful employment.  Pharmacist Clinical Goal(s):  Marland Kitchen Over the next 90 days, patient will work with PharmD and primary care provider to address needs related to optimized medication management  Interventions: . Provided empathetic listening as patient spoke about her grief and healing. She notes that she was finally able to open the door to her daughter's room, and has started the process of cleaning out the room to change into an exercise room or her home office.  Ronne Binning counselor LCSW [name] to see if she can provide any SW financial resource services. Will reach back out to [place] to see how else she might be able to support patient. Will investigate other avenues to get SW involved for financial support resources  Patient Self Care Activities:  . Self administers medications as prescribed . Calls pharmacy for medication refills  Please see past updates related to this goal by clicking on the "Past Updates" button in the selected goal          Plan:  - Will continue to work with the multidiscplinary team to support this patient.   [name], PharmD, [place] Pharmacist [facility] [place] [phone]

## 2018-11-10 ENCOUNTER — Telehealth: Payer: Self-pay

## 2018-11-10 NOTE — Telephone Encounter (Signed)
Copied from [place] [phone]. Topic: Referral - Status >> Nov 10, 2018  0000000 PM Simone Curia D wrote: [phone] Left message on voicemail for patient to return my call regarding food banks and utility assistance. Ambrose Mantle [phone]

## 2018-11-10 NOTE — Addendum Note (Signed)
Addended by: De Hollingshead on: 11/10/2018 08:26 AM   Modules accepted: Orders

## 2018-11-10 NOTE — Chronic Care Management (AMB) (Signed)
  Chronic Care Management   Note  11/10/2018 Name: Ashley Cross MRN: [id number] DOB: 1955/08/31  Ashley Cross is a 63 y.o. year old female who is a primary care patient of McLean-Scocuzza, Nino Glow, MD. The CCM team was consulted for assistance with chronic disease management and care coordination needs.     Placing re-referral for Connected Care team for financial resource assistance.   [name], PharmD, [place] Pharmacist [facility] [place] [phone]

## 2018-11-10 NOTE — Addendum Note (Signed)
Addended by: De Hollingshead on: 11/10/2018 09:09 AM   Modules accepted: Orders

## 2018-11-11 ENCOUNTER — Telehealth: Payer: Self-pay | Admitting: *Deleted

## 2018-11-11 ENCOUNTER — Other Ambulatory Visit: Payer: Self-pay | Admitting: Psychiatry

## 2018-11-11 ENCOUNTER — Telehealth: Payer: Self-pay

## 2018-11-11 ENCOUNTER — Ambulatory Visit: Payer: Self-pay | Admitting: Pharmacist

## 2018-11-11 ENCOUNTER — Telehealth: Payer: Self-pay | Admitting: Internal Medicine

## 2018-11-11 DIAGNOSIS — F331 Major depressive disorder, recurrent, moderate: Secondary | ICD-10-CM

## 2018-11-11 NOTE — Telephone Encounter (Signed)
Contacted patient, LVM w/ this information

## 2018-11-11 NOTE — Telephone Encounter (Signed)
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 Patient is wanting to know your thoughts about her health and ability to pick up a part time job. I think it's coming from a financial struggle place, so I'm still concurrently working on getting a SW to outreach and see if there is anything else that can be done to help.   Anyways, she was interested in your thoughts on if she can return to work, given lung disease and COVID. I told her someone would be in touch.   Thanks!   [name]

## 2018-11-11 NOTE — Telephone Encounter (Signed)
Copied from [place] [phone]. Topic: Referral - Status >> Nov 11, 2018  [id number] AM Simone Curia D wrote: [id number] Spoke with patient about financial resources for utilities, credit counseling and grief counseling.  Emailed resources per patient request. Ambrose Mantle [phone]

## 2018-11-11 NOTE — Telephone Encounter (Signed)
Copied from [place] [phone]. Topic: Referral - Status >> Nov 11, 2018  [id number] AM Simone Curia D wrote: [id number] Spoke with patient about financial resources for utilities, credit counseling and grief counseling.  Emailed resources per patient request. Ambrose Mantle [phone]

## 2018-11-11 NOTE — Telephone Encounter (Signed)
Copied from [place] [phone]. Topic: General - Inquiry >> Nov 11, 2018  8:36 AM Reyne Dumas L wrote: Reason for CRM:   Pt is considering applying for a job.  Pt states that in the past she has been told that working is not a good things for her.  Pt wants to know what Dr. McLean-Scocuzza's opinion is on that.  Pt states that the job that she is looking to apply for is working with the public.   Pt can be reached at [phone].

## 2018-11-11 NOTE — Chronic Care Management (AMB) (Signed)
  Chronic Care Management   Note  11/11/2018 Name: Ashley Cross MRN: [id number] DOB: 05-31-1955  Ashley Cross is a 63 y.o. year old female who is a primary care patient of McLean-Scocuzza, Nino Glow, MD. The CCM team was consulted for assistance with chronic disease management and care coordination needs.    Received a call from patient today, inquiring about the possibility of getting a job. 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 She decided not to go, but wanted to know what Dr. Terese Door thoughts were about if she could have a part time job due to her health. Will message PCP about this.   Additionally, I am working on a referral to SW to assist in financial support.   [name], PharmD, [place] Pharmacist [facility] [place] [phone]

## 2018-11-12 ENCOUNTER — Telehealth: Payer: Self-pay | Admitting: *Deleted

## 2018-11-12 MED ORDER — DULOXETINE HCL 60 MG PO CPEP: 60.0000 mg | ORAL_CAPSULE | Freq: Every day | ORAL | 1 refills | Status: DC

## 2018-11-12 NOTE — Telephone Encounter (Signed)
Yes, I did. Called yesterday afternoon and left a message

## 2018-11-12 NOTE — Telephone Encounter (Signed)
[name], Did you relay message to patient?   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

## 2018-11-12 NOTE — Patient Outreach (Signed)
[place] [facility]) Care Management  11/12/2018  Ashley Cross 08-20-1955 [id number]   CSW received referral from [place], [place] Pharmacist, [name] for financial concerns. Financial resources for [employer], food banks, credit counseling and grief counseling. CSW made an initial attempt to try and contact patient today to perform phone assessment, as well as assess and assist with social needs and services, without success. A HIPPA compliant message was left for patient on voicemail (phone#: [phone]). CSW is currently awaiting a return call & will make a second outreach attempt within the next 3-4 days, if CSW does not receive a return call from patient in the meantime.    Raynaldo Opitz, LCSW [facility]  Clinical Social Worker cell #: [phone]

## 2018-11-12 NOTE — Telephone Encounter (Signed)
Sent cymbalta to pharmacy 

## 2018-11-13 ENCOUNTER — Ambulatory Visit (INDEPENDENT_AMBULATORY_CARE_PROVIDER_SITE_OTHER): Payer: PPO | Admitting: Psychiatry

## 2018-11-13 ENCOUNTER — Encounter: Payer: Self-pay | Admitting: Psychiatry

## 2018-11-13 ENCOUNTER — Other Ambulatory Visit: Payer: Self-pay

## 2018-11-13 DIAGNOSIS — F17200 Nicotine dependence, unspecified, uncomplicated: Secondary | ICD-10-CM | POA: Diagnosis not present

## 2018-11-13 DIAGNOSIS — F331 Major depressive disorder, recurrent, moderate: Secondary | ICD-10-CM | POA: Diagnosis not present

## 2018-11-13 DIAGNOSIS — F1010 Alcohol abuse, uncomplicated: Secondary | ICD-10-CM

## 2018-11-13 MED ORDER — DULOXETINE HCL 20 MG PO CPEP: 20.0000 mg | ORAL_CAPSULE | Freq: Every day | ORAL | 1 refills | Status: DC

## 2018-11-13 NOTE — Progress Notes (Signed)
Virtual Visit via Video Note  I connected with Ashley Cross on 11/13/18 at 11:00 AM EDT by a video enabled telemedicine application and verified that I am speaking with the correct person using two identifiers.   I discussed the limitations of evaluation and management by telemedicine and the availability of in person appointments. The patient expressed understanding and agreed to proceed.     I discussed the assessment and treatment plan with the patient. The patient was provided an opportunity to ask questions and all were answered. The patient agreed with the plan and demonstrated an understanding of the instructions.   The patient was advised to call back or seek an in-person evaluation if the symptoms worsen or if the condition fails to improve as anticipated.  [place] MD OP Progress Note  11/13/2018 3:52 PM Keslee Economou  MRN:  [id number]  Chief Complaint:  Chief Complaint    Follow-up     HPI: Ashley Cross is a 63 year old Caucasian female, on disability, lives in [place], has a history of MDD, alcohol use disorder, history of breast cancer, chronic pain, hypertension was evaluated by telemedicine today.  Patient today reports she is currently struggling with some depressive symptoms.  It was  the anniversary of her daughter who passed away a week ago.  She reports it was very hard to get through those days.  She however had support from friends who came in to get help her.  Patient reports she however continues to struggle with some sadness and crying spells on and off.  Every 2 weeks she goes through a day when she does not do much and stays in bed for a whole day.  It does not go over that 1 day and she is able to push herself out of the bed the next day.  Patient reports sleep continues to be restless.  She however has not started taking Lunesta yet.  She reports she would like to start taking the sleep medication soon.  She has been cutting back on alcohol and is more  aware of her problem.  She reports she is cutting back on smoking cigarettes however the last week was hard and she smoked a lot.  She has been making use of nicotine lozenges.  Patient denies any suicidality or homicidality or perceptual disturbances.  Patient reports psychotherapy sessions as going well.  She enjoys working with her therapist and that has been very helpful.  Patient denies any other concerns today. Visit Diagnosis:    ICD-10-CM   1. MDD (major depressive disorder), recurrent episode, moderate (HCC)  F33.1 DULoxetine (CYMBALTA) 20 MG capsule  2. Alcohol use disorder, mild, abuse  F10.10   3. Tobacco use disorder  F17.200     Past Psychiatric History: I have reviewed past psychiatric history from my progress note on 09/16/2018.  Past trials of Zoloft, Wellbutrin, Lexapro, Celexa  Past Medical History:  Past Medical History:  Diagnosis Date  . Anxiety   . Breast cancer ([place])    Left breast(nipple mass) 2018  . Cervical stenosis of spine   . Chicken pox   . Chronic back pain   . Colon polyps   . COPD (chronic obstructive pulmonary disease) ([place])   . Cyst of left nipple   . Depression   . Hypertension   . Insomnia   . Personal history of radiation therapy     Past Surgical History:  Procedure Laterality Date  . BACK SURGERY     x 3 (2010 x  2; 2013 x 1)   . BREAST BIOPSY Left    core- neg  . BREAST LUMPECTOMY Left 2018  . BREAST SURGERY Left    breast biopsy  . CERVICAL CONE BIOPSY    . CERVICAL FUSION     times 2  . COLONOSCOPY WITH PROPOFOL N/A 04/18/2015   Procedure: COLONOSCOPY WITH PROPOFOL;  Surgeon: [name], MD;  Location: [facility] ENDOSCOPY;  Service: Endoscopy;  Laterality: N/A;  . ELBOW SURGERY     2014 b/l elbows per pt ulnar nerve   . ELBOW SURGERY     x2   . EPIDURAL BLOCK INJECTION     Dr. Maryjean Ka   . MASS EXCISION Left 04/12/2016   Procedure: EXCISION OF LEFT NIPPLE MASS;  Surgeon: Stark Klein, MD;  Location: [place];  Service: General;  Laterality: Left;  . NECK SURGERY    . POSTERIOR CERVICAL FUSION/FORAMINOTOMY  03/07/2011   Procedure: POSTERIOR CERVICAL FUSION/FORAMINOTOMY LEVEL 4;  Surgeon: Eustace Moore, MD;  Location: [facility] NEURO ORS;  Service: Neurosurgery;  Laterality: N/A;  cervical three to thoracic one posterior cervical fusion   . SENTINEL NODE BIOPSY Left 05/29/2016   Procedure: LEFT SENTINEL LYMPH NODE BIOPSY;  Surgeon: Stark Klein, MD;  Location: [facility];  Service: General;  Laterality: Left;  . TONSILLECTOMY      Family Psychiatric History: I have reviewed family psychiatric history from my progress note on 09/16/2018  Family History:  Family History  Problem Relation Age of Onset  . Breast cancer Mother 42  . Hypertension Mother   . Cancer Mother        breast dx'ed 16   . Cancer Father        prostate  . Hypertension Father   . Drug abuse Daughter   . Hypertension Maternal Grandmother   . Breast cancer Cousin     Social History: I have reviewed social history from my progress note on 09/16/2018 Social History   Socioeconomic History  . Marital status: Divorced    Spouse name: Not on file  . Number of children: 2  . Years of education: Not on file  . Highest education level: Not on file  Occupational History  . Not on file  Social Needs  . Financial resource strain: Not on file  . Food insecurity    Worry: Not on file    Inability: Not on file  . Transportation needs    Medical: Not on file    Non-medical: Not on file  Tobacco Use  . Smoking status: Current Every Day Smoker    Packs/day: 1.00    Years: 30.00    Pack years: 30.00    Types: Cigarettes  . Smokeless tobacco: Never Used  Substance and Sexual Activity  . Alcohol use: Yes    Comment: social  . Drug use: No    Comment: on opana since jan 2018  . Sexual activity: Not on file  Lifestyle  . Physical activity    Days per week: Not on file    Minutes per session: Not on file  . Stress: Not on file   Relationships  . Social Herbalist on phone: Not on file    Gets together: Not on file    Attends religious service: Not on file    Active member of club or organization: Not on file    Attends meetings of clubs or organizations: Not on file    Relationship status: Not  on file  Other Topics Concern  . Not on file  Social History Narrative   As of 01/25/17 not sexually active of in relationship    Divorced   2 kids son and daughter (though daughter died of suicide in May 10, 2017)   [place] has 3 dogs            Allergies:  Allergies  Allergen Reactions  . Sulfa Antibiotics Itching and Rash  . Sulfonamide Derivatives Itching and Rash    Metabolic Disorder Labs: Lab Results  Component Value Date   HGBA1C 6.1 02/13/2018   No results found for: PROLACTIN Lab Results  Component Value Date   CHOL 178 02/13/2018   TRIG 86.0 02/13/2018   HDL 80.60 02/13/2018   CHOLHDL 2 02/13/2018   VLDL 17.2 02/13/2018   LDLCALC 81 02/13/2018   LDLCALC 59 01/25/2017   Lab Results  Component Value Date   TSH 2.97 02/13/2018   TSH 0.95 01/25/2017    Therapeutic Level Labs: No results found for: LITHIUM No results found for: VALPROATE No components found for:  CBMZ  Current Medications: Current Outpatient Medications  Medication Sig Dispense Refill  . albuterol (VENTOLIN HFA) 108 (90 Base) MCG/ACT inhaler Inhale 1-2 puffs into the lungs every 4 (four) hours as needed for wheezing or shortness of breath. 18 g 12  . azelastine (ASTELIN) 0.1 % nasal spray     . benzonatate (TESSALON) 200 MG capsule Take 1 capsule (200 mg total) by mouth 3 (three) times daily as needed for cough. 40 capsule 0  . buPROPion (WELLBUTRIN XL) 300 MG 24 hr tablet Take 1 tablet (300 mg total) by mouth daily. 90 tablet 3  . Cholecalciferol (D3 ADULT PO) Take by mouth. 10000 IU weekly    . cyanocobalamin 100 MCG tablet Take 100 mcg by mouth daily.    . diazepam (VALIUM) 5 MG tablet Take  1 tablet (5 mg total) by mouth at bedtime as needed for muscle spasms or anxiety. 30 tablet 5  . doxycycline (VIBRA-TABS) 100 MG tablet Take 1 tablet (100 mg total) by mouth 2 (two) times daily. With food 14 tablet 0  . DULoxetine (CYMBALTA) 20 MG capsule Take 1 capsule (20 mg total) by mouth daily. To be combined with 60 mg 30 capsule 1  . DULoxetine (CYMBALTA) 60 MG capsule Take 1 capsule (60 mg total) by mouth daily. 30 capsule 1  . eszopiclone (LUNESTA) 1 MG TABS tablet Take 1 tablet (1 mg total) by mouth at bedtime. Take immediately before bedtime 30 tablet 1  . fluticasone (FLONASE) 50 MCG/ACT nasal spray Place 2 sprays into both nostrils daily. Max 2 sprays 16 g 11  . gabapentin (NEURONTIN) 600 MG tablet Take 1,200 mg by mouth 3 (three) times daily.    . hydrochlorothiazide (HYDRODIURIL) 12.5 MG tablet Take 1 tablet (12.5 mg total) by mouth daily. In am 90 tablet 3  . hydrOXYzine (VISTARIL) 25 MG capsule Take 1-2 capsules (25-50 mg total) by mouth at bedtime as needed. For severe anxiety 60 capsule 1  . ipratropium (ATROVENT) 0.06 % nasal spray Place 2 sprays into both nostrils 3 (three) times daily. prn 15 mL 12  . letrozole (FEMARA) 2.5 MG tablet Take 1 tablet (2.5 mg total) by mouth daily. 90 tablet 3  . loratadine (CLARITIN) 10 MG tablet Take 1 tablet (10 mg total) by mouth daily as needed for allergies. At night (Patient not taking: Reported on 08/19/2018) 90 tablet 3  .  methylPREDNISolone (MEDROL DOSEPAK) 4 MG TBPK tablet     . montelukast (SINGULAIR) 10 MG tablet Take 1 tablet (10 mg total) by mouth at bedtime. 90 tablet 3  . Multiple Vitamins-Minerals (MULTIVITAMIN) tablet Take 1 tablet by mouth daily. 90 tablet 3  . omeprazole (PRILOSEC) 20 MG capsule     . oxymorphone (OPANA) 10 MG tablet Take 10 mg by mouth 2 (two) times daily.     Marland Kitchen senna-docusate (SENOKOT-S) 8.6-50 MG tablet Take 1-2 tablets by mouth daily as needed for mild constipation. 180 tablet 3  . telmisartan (MICARDIS) 40  MG tablet Take 1 tablet (40 mg total) by mouth daily. At night 90 tablet 3  . umeclidinium-vilanterol (ANORO ELLIPTA) 62.5-25 MCG/INH AEPB Inhale 1 puff into the lungs daily. 60 each 12   No current facility-administered medications for this visit.      Musculoskeletal: Strength & Muscle Tone: UTA Gait & Station: normal Patient leans: N/A  Psychiatric Specialty Exam: Review of Systems  Psychiatric/Behavioral: Positive for depression. The patient has insomnia.   All other systems reviewed and are negative.   There were no vitals taken for this visit.There is no height or weight on file to calculate BMI.  General Appearance: Casual  Eye Contact:  Fair  Speech:  Clear and Coherent  Volume:  Normal  Mood:  Depressed  Affect:  Congruent  Thought Process:  Goal Directed and Descriptions of Associations: Intact  Orientation:  Full (Time, Place, and Person)  Thought Content: Logical   Suicidal Thoughts:  No  Homicidal Thoughts:  No  Memory:  Immediate;   Fair Recent;   Fair Remote;   Fair  Judgement:  Fair  Insight:  Fair  Psychomotor Activity:  Normal  Concentration:  Concentration: Fair and Attention Span: Fair  Recall:  [employer] of Knowledge: Fair  Language: Fair  Akathisia:  No  Handed:  Right  AIMS (if indicated): denies tremors, rigidity  Assets:  Communication Skills Desire for Improvement Social Support  ADL's:  Intact  Cognition: WNL  Sleep:  Restless   Screenings: GAD-7     Office Visit from 09/30/2017 in [facility]  Total GAD-7 Score  18    PHQ2-9     Office Visit from 09/16/2018 in [facility] Office Visit from 09/30/2017 in [facility] Office Visit from 08/19/2017 in [facility] from 06/13/2016 in [facility]  PHQ-2 Total Score  0  4  4  1   PHQ-9 Total Score  -  18  16  -       Assessment and Plan: Lakita is a  63 year old Caucasian female, divorced on disability, has a history of depression, chronic pain, hypertension, history of breast cancer was evaluated by telemedicine today.  Patient is biologically predisposed given her family history.  She also has psychosocial stressors of her own health issues including pain, death of her daughter a year ago.  Patient continues to struggle with sadness and sleep problems.  She does have a history of alcoholism although improving.  She will continue psychotherapy sessions.  Plan as noted below.  Plan MDD-some progress Increase Cymbalta to 80 mg p.o. daily Wellbutrin XL 300 mg p.o. daily Hydroxyzine 25 mg p.o. daily PRN for anxiety symptoms as well as sleep Lunesta 1 mg p.o. nightly for sleep.  Advised patient to start taking it since she does have some sleep problems.  She has been noncompliant. Continue  CBT with Ms. [name]  Alcohol use disorder mild-improving Provided substance abuse counseling She will continue to cut back  Tobacco use disorder-improving Provided smoking cessation counseling.  Follow-up in clinic in 4 weeks or sooner if needed.  Nov 13 at 10:20 AM  I have spent atleast 15 minutes non face to face with patient today. More than 50 % of the time was spent for psychoeducation and supportive psychotherapy and care coordination. This note was generated in part or whole with voice recognition software. Voice recognition is usually quite accurate but there are transcription errors that can and very often do occur. I apologize for any typographical errors that were not detected and corrected.     Ursula Alert, MD 11/13/2018, 3:52 PM

## 2018-11-17 ENCOUNTER — Ambulatory Visit: Payer: Self-pay | Admitting: *Deleted

## 2018-11-17 ENCOUNTER — Ambulatory Visit: Payer: PPO | Admitting: Licensed Clinical Social Worker

## 2018-11-18 ENCOUNTER — Telehealth: Payer: Self-pay

## 2018-11-18 NOTE — Telephone Encounter (Signed)
Copied from [place]. Topic: Referral - Status >> Nov 18, 2018  123456 PM Simone Curia D wrote: [phone] Left message on voicemail to follow up about financial resources for utilities, credit counseling and grief support. Ambrose Mantle [phone]

## 2018-11-21 ENCOUNTER — Telehealth: Payer: Self-pay

## 2018-11-21 NOTE — Telephone Encounter (Signed)
Copied from [place] [id number]. Topic: Referral - Status >> Nov 21, 2018  [id number] PM Simone Curia D wrote: [id number] Spoke with patient today about wheels on meals and credit counseling she had misplaced the numbers.  She will call them next week. Patient was very appreciative of everyone's help. Ambrose Mantle [phone]

## 2018-11-24 ENCOUNTER — Telehealth: Payer: PPO

## 2018-11-24 ENCOUNTER — Ambulatory Visit: Payer: Self-pay | Admitting: Pharmacist

## 2018-11-24 NOTE — Chronic Care Management (AMB) (Signed)
  Chronic Care Management   Note  11/24/2018 Name: [name] MRN: [id number] DOB: Jul 20, 1955  [name] is a 63 y.o. year old female who is a primary care patient of McLean-Scocuzza, Nino Glow, MD. The CCM team was consulted for assistance with chronic disease management and care coordination needs.    Attempted to contact patient for medication management follow up. Left HIPAA compliant message for patient to return my call at her convenience.   Follow up plan: - If I do not hear back, will outreach again in the next 1-3 weeks for continued medication management support  [name], PharmD, [place] Pharmacist [place] [phone]

## 2018-12-09 ENCOUNTER — Ambulatory Visit: Payer: Self-pay | Admitting: *Deleted

## 2018-12-11 ENCOUNTER — Other Ambulatory Visit: Payer: Self-pay | Admitting: *Deleted

## 2018-12-11 ENCOUNTER — Encounter: Payer: Self-pay | Admitting: Pharmacist

## 2018-12-11 ENCOUNTER — Telehealth: Payer: PPO

## 2018-12-11 ENCOUNTER — Ambulatory Visit: Payer: Self-pay | Admitting: Pharmacist

## 2018-12-11 NOTE — Patient Outreach (Signed)
[facility]) Care Management  12/11/2018  Shiori Connaway [birth year] [id number]   CSW made a second attempt reach patient to follow-up on referral made by [facility] Pharmacist, [name] for community/financial resources but patient did not answer. CSW left HIPPA compliant voicemail (ph#: [phone]) and had CMA mail out unsuccessful outreach letter along with resources for Consumer Credit Counseling Services & grief counseling through [place]. CSW will make 3rd attempt to reach patient in 4 days. CSW will make a third outreach attempt in 10 days, if CSW does not receive a return call from patient in the meantime.      Raynaldo Opitz, LCSW [facility]  Clinical Social Worker cell #: [phone]

## 2018-12-11 NOTE — Chronic Care Management (AMB) (Signed)
  Chronic Care Management   Note  12/11/2018 Name: Ashley Cross MRN: [id number] DOB: 1955/11/06  Ashley Cross is a 63 y.o. year old female who is a primary care patient of McLean-Scocuzza, Nino Glow, MD. The CCM team was consulted for assistance with chronic disease management and care coordination needs.    Attempted to contact patient to follow up on medication management needs. Left HIPAA compliant message for patient to return my call at her convenience. Collaborated w/ Raynaldo Opitz, LCSW, regarding patient's financial support concerns.   Follow up plan: - If I do not hear back from patient, will outreach again in the next 4-5 weeks for continued medication management support  [name], PharmD, [place] Pharmacist [place] [phone]

## 2018-12-15 ENCOUNTER — Ambulatory Visit (INDEPENDENT_AMBULATORY_CARE_PROVIDER_SITE_OTHER): Payer: PPO | Admitting: Licensed Clinical Social Worker

## 2018-12-15 ENCOUNTER — Encounter: Payer: Self-pay | Admitting: Licensed Clinical Social Worker

## 2018-12-15 ENCOUNTER — Other Ambulatory Visit: Payer: Self-pay

## 2018-12-15 DIAGNOSIS — F331 Major depressive disorder, recurrent, moderate: Secondary | ICD-10-CM

## 2018-12-15 NOTE — Progress Notes (Signed)
Virtual Visit via Telephone Note  I connected with Ashley Cross on 12/15/18 at  9:00 AM EST by telephone and verified that I am speaking with the correct person using two identifiers.   I discussed the limitations, risks, security and privacy concerns of performing an evaluation and management service by telephone and the availability of in person appointments. I also discussed with the patient that there may be a patient responsible charge related to this service. The patient expressed understanding and agreed to proceed.    I discussed the assessment and treatment plan with the patient. The patient was provided an opportunity to ask questions and all were answered. The patient agreed with the plan and demonstrated an understanding of the instructions.   The patient was advised to call back or seek an in-person evaluation if the symptoms worsen or if the condition fails to improve as anticipated.  I provided 53 minutes of non-face-to-face time during this encounter.   [name], LCSW    THERAPIST PROGRESS NOTE  Session Time: 0900 Participation Level: Active  Behavioral Response: NeatAlertDepressed  Type of Therapy: Individual Therapy  Treatment Goals addressed: Coping  Interventions: CBT and Supportive  Summary: Ashley Cross is a 63 y.o. female who presents with continued symptoms related to her diagnosis. Ashley Cross reports doing well since our last session. She reports she has had days, however, where she does not want to get out of bed. She reports feeling she has disappointed LCSW and others who she wanted to be proud of her. LCSW validated Ashley Cross's feelings, and emphasized LCSW will never be disappointed, and it is so important to be honest and straight forward in therapy. Ashley Cross was able to understand this and expressed agreement. LCSW asked Ashley Cross to think about who is setting expectations for her, how they have conveyed that to her, and if those expectations  are reasonable. LCSW encouraged Ashley Cross to listen only to herself when it comes to expectations set. Ashley Cross expressed understanding and agreement with this information. Further, LCSW encouraged Ashley Cross to challenge thoughts as they come up, and treat herself as she would a friend. This led to a discussion on how we are often much nicer to others than we are ourselves. We discussed how boundaries can help in situations where others' expectations of our behaviors are dictating our actions. We discussed what boundaries look like and why they are so important.   Suicidal/Homicidal: No   Therapist Response: Ashley Cross continues to work towards her tx goals. We will continue to work on improving CBT skills to manage anxiety and depression symptoms moving forward.   Plan: Return again in 4 weeks.  Diagnosis: Axis I: MDD    Axis II: No diagnosis    [name], LCSW 12/15/2018

## 2018-12-17 ENCOUNTER — Other Ambulatory Visit: Payer: Self-pay | Admitting: Psychiatry

## 2018-12-17 ENCOUNTER — Ambulatory Visit: Payer: Self-pay | Admitting: *Deleted

## 2018-12-17 DIAGNOSIS — F331 Major depressive disorder, recurrent, moderate: Secondary | ICD-10-CM

## 2018-12-19 ENCOUNTER — Ambulatory Visit: Payer: PPO | Admitting: Psychiatry

## 2018-12-19 ENCOUNTER — Ambulatory Visit (INDEPENDENT_AMBULATORY_CARE_PROVIDER_SITE_OTHER): Payer: PPO | Admitting: Psychiatry

## 2018-12-19 ENCOUNTER — Encounter: Payer: Self-pay | Admitting: Psychiatry

## 2018-12-19 ENCOUNTER — Other Ambulatory Visit: Payer: Self-pay

## 2018-12-19 DIAGNOSIS — F1010 Alcohol abuse, uncomplicated: Secondary | ICD-10-CM

## 2018-12-19 DIAGNOSIS — F17200 Nicotine dependence, unspecified, uncomplicated: Secondary | ICD-10-CM | POA: Diagnosis not present

## 2018-12-19 DIAGNOSIS — F331 Major depressive disorder, recurrent, moderate: Secondary | ICD-10-CM

## 2018-12-19 MED ORDER — DULOXETINE HCL 60 MG PO CPEP: 60.0000 mg | ORAL_CAPSULE | Freq: Every day | ORAL | 1 refills | Status: DC

## 2018-12-19 MED ORDER — BUPROPION HCL ER (XL) 150 MG PO TB24: 150.0000 mg | ORAL_TABLET | Freq: Every day | ORAL | 0 refills | Status: DC
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
## 2018-12-23 ENCOUNTER — Other Ambulatory Visit: Payer: Self-pay | Admitting: Internal Medicine

## 2018-12-23 DIAGNOSIS — I10 Essential (primary) hypertension: Secondary | ICD-10-CM

## 2018-12-23 MED ORDER — TELMISARTAN 40 MG PO TABS: 40.0000 mg | ORAL_TABLET | Freq: Every day | ORAL | 3 refills | Status: DC

## 2018-12-25 DIAGNOSIS — R05 Cough: Secondary | ICD-10-CM | POA: Diagnosis not present

## 2018-12-25 DIAGNOSIS — K219 Gastro-esophageal reflux disease without esophagitis: Secondary | ICD-10-CM | POA: Diagnosis not present

## 2018-12-25 DIAGNOSIS — R490 Dysphonia: Secondary | ICD-10-CM | POA: Diagnosis not present

## 2018-12-25 DIAGNOSIS — J301 Allergic rhinitis due to pollen: Secondary | ICD-10-CM | POA: Diagnosis not present

## 2018-12-25 DIAGNOSIS — J320 Chronic maxillary sinusitis: Secondary | ICD-10-CM | POA: Diagnosis not present

## 2018-12-31 ENCOUNTER — Ambulatory Visit: Payer: PPO | Admitting: Internal Medicine

## 2018-12-31 ENCOUNTER — Other Ambulatory Visit: Payer: Self-pay

## 2018-12-31 ENCOUNTER — Encounter: Payer: Self-pay | Admitting: Internal Medicine

## 2018-12-31 VITALS — Ht 66.0 in | Wt 122.0 lb

## 2018-12-31 DIAGNOSIS — G4700 Insomnia, unspecified: Secondary | ICD-10-CM | POA: Diagnosis not present

## 2018-12-31 DIAGNOSIS — E538 Deficiency of other specified B group vitamins: Secondary | ICD-10-CM

## 2018-12-31 DIAGNOSIS — Z13220 Encounter for screening for lipoid disorders: Secondary | ICD-10-CM | POA: Diagnosis not present

## 2018-12-31 DIAGNOSIS — G8929 Other chronic pain: Secondary | ICD-10-CM

## 2018-12-31 DIAGNOSIS — F17200 Nicotine dependence, unspecified, uncomplicated: Secondary | ICD-10-CM

## 2018-12-31 DIAGNOSIS — I10 Essential (primary) hypertension: Secondary | ICD-10-CM

## 2018-12-31 DIAGNOSIS — Z853 Personal history of malignant neoplasm of breast: Secondary | ICD-10-CM | POA: Diagnosis not present

## 2018-12-31 DIAGNOSIS — F329 Major depressive disorder, single episode, unspecified: Secondary | ICD-10-CM

## 2018-12-31 DIAGNOSIS — R634 Abnormal weight loss: Secondary | ICD-10-CM

## 2018-12-31 DIAGNOSIS — E559 Vitamin D deficiency, unspecified: Secondary | ICD-10-CM | POA: Diagnosis not present

## 2018-12-31 DIAGNOSIS — Z1329 Encounter for screening for other suspected endocrine disorder: Secondary | ICD-10-CM

## 2018-12-31 DIAGNOSIS — Z1231 Encounter for screening mammogram for malignant neoplasm of breast: Secondary | ICD-10-CM

## 2018-12-31 DIAGNOSIS — Z1389 Encounter for screening for other disorder: Secondary | ICD-10-CM | POA: Diagnosis not present

## 2018-12-31 DIAGNOSIS — F419 Anxiety disorder, unspecified: Secondary | ICD-10-CM

## 2018-12-31 DIAGNOSIS — R7303 Prediabetes: Secondary | ICD-10-CM

## 2018-12-31 DIAGNOSIS — M25512 Pain in left shoulder: Secondary | ICD-10-CM | POA: Diagnosis not present

## 2018-12-31 NOTE — Patient Instructions (Signed)
Shoulder Impingement Syndrome Rehab Ask your health care provider which exercises are safe for you. Do exercises exactly as told by your health care provider and adjust them as directed. It is normal to feel mild stretching, pulling, tightness, or discomfort as you do these exercises. Stop right away if you feel sudden pain or your pain gets worse. Do not begin these exercises until told by your health care provider. Stretching and range-of-motion exercise This exercise warms up your muscles and joints and improves the movement and flexibility of your shoulder. This exercise also helps to relieve pain and stiffness. Passive horizontal adduction In passive adduction, you use your other hand to move the injured arm toward your body. The injured arm does not move on its own. In this movement, your arm is moved across your body in the horizontal plane (horizontal adduction). 1. Sit or stand and pull your left / right elbow across your chest, toward your other shoulder. Stop when you feel a gentle stretch in the back of your shoulder and upper arm. ? Keep your arm at shoulder height. ? Keep your arm as close to your body as you comfortably can. 2. Hold for __________ seconds. 3. Slowly return to the starting position. Repeat __________ times. Complete this exercise __________ times a day. Strengthening exercises These exercises build strength and endurance in your shoulder. Endurance is the ability to use your muscles for a long time, even after they get tired. External rotation, isometric This is an exercise in which you press the back of your wrist against a door frame without moving your shoulder joint (isometric). 1. Stand or sit in a doorway, facing the door frame. 2. Bend your left / right elbow and place the back of your wrist against the door frame. Only the back of your wrist should be touching the frame. Keep your upper arm at your side. 3. Gently press your wrist against the door frame, as if  you are trying to push your arm away from your abdomen (external rotation). Press as hard as you are able without pain. ? Avoid shrugging your shoulder while you press your wrist against the door frame. Keep your shoulder blade tucked down toward the middle of your back. 4. Hold for __________ seconds. 5. Slowly release the tension, and relax your muscles completely before you repeat the exercise. Repeat __________ times. Complete this exercise __________ times a day. Internal rotation, isometric This is an exercise in which you press your palm against a door frame without moving your shoulder joint (isometric). 1. Stand or sit in a doorway, facing the door frame. 2. Bend your left / right elbow and place the palm of your hand against the door frame. Only your palm should be touching the frame. Keep your upper arm at your side. 3. Gently press your hand against the door frame, as if you are trying to push your arm toward your abdomen (internal rotation). Press as hard as you are able without pain. ? Avoid shrugging your shoulder while you press your hand against the door frame. Keep your shoulder blade tucked down toward the middle of your back. 4. Hold for __________ seconds. 5. Slowly release the tension, and relax your muscles completely before you repeat the exercise. Repeat __________ times. Complete this exercise __________ times a day. Scapular protraction, supine  1. Lie on your back on a firm surface (supine position). Hold a __________ weight in your left / right hand. 2. Raise your left / right arm straight   into the air so your hand is directly above your shoulder joint. 3. Push the weight into the air so your shoulder (scapula) lifts off the surface that you are lying on. The scapula will push up or forward (protraction). Do not move your head, neck, or back. 4. Hold for __________ seconds. 5. Slowly return to the starting position. Let your muscles relax completely before you repeat  this exercise. Repeat __________ times. Complete this exercise __________ times a day. Scapular retraction  1. Sit in a stable chair without armrests, or stand up. 2. Secure an exercise band to a stable object in front of you so the band is at shoulder height. 3. Hold one end of the exercise band in each hand. Your palms should face down. 4. Squeeze your shoulder blades together (retraction) and move your elbows slightly behind you. Do not shrug your shoulders upward while you do this. 5. Hold for __________ seconds. 6. Slowly return to the starting position. Repeat __________ times. Complete this exercise __________ times a day. Shoulder extension  1. Sit in a stable chair without armrests, or stand up. 2. Secure an exercise band to a stable object in front of you so the band is above shoulder height. 3. Hold one end of the exercise band in each hand. 4. Straighten your elbows and lift your hands up to shoulder height. 5. Squeeze your shoulder blades together and pull your hands down to the sides of your thighs (extension). Stop when your hands are straight down by your sides. Do not let your hands go behind your body. 6. Hold for __________ seconds. 7. Slowly return to the starting position. Repeat __________ times. Complete this exercise __________ times a day. This information is not intended to replace advice given to you by your health care provider. Make sure you discuss any questions you have with your health care provider. Document Released: 01/22/2005 Document Revised: 05/16/2018 Document Reviewed: 02/17/2018 [employer] Patient Education  2020 [employer].   Shoulder Exercises Ask your health care provider which exercises are safe for you. Do exercises exactly as told by your health care provider and adjust them as directed. It is normal to feel mild stretching, pulling, tightness, or discomfort as you do these exercises. Stop right away if you feel sudden pain or your pain gets  worse. Do not begin these exercises until told by your health care provider. Stretching exercises External rotation and abduction This exercise is sometimes called corner stretch. This exercise rotates your arm outward (external rotation) and moves your arm out from your body (abduction). 1. Stand in a doorway with one of your feet slightly in front of the other. This is called a staggered stance. If you cannot reach your forearms to the door frame, stand facing a corner of a room. 2. Choose one of the following positions as told by your health care provider: ? Place your hands and forearms on the door frame above your head. ? Place your hands and forearms on the door frame at the height of your head. ? Place your hands on the door frame at the height of your elbows. 3. Slowly move your weight onto your front foot until you feel a stretch across your chest and in the front of your shoulders. Keep your head and chest upright and keep your abdominal muscles tight. 4. Hold for __________ seconds. 5. To release the stretch, shift your weight to your back foot. Repeat __________ times. Complete this exercise __________ times a day. Extension,  standing 1. Stand and hold a broomstick, a cane, or a similar object behind your back. ? Your hands should be a little wider than shoulder width apart. ? Your palms should face away from your back. 2. Keeping your elbows straight and your shoulder muscles relaxed, move the stick away from your body until you feel a stretch in your shoulders (extension). ? Avoid shrugging your shoulders while you move the stick. Keep your shoulder blades tucked down toward the middle of your back. 3. Hold for __________ seconds. 4. Slowly return to the starting position. Repeat __________ times. Complete this exercise __________ times a day. Range-of-motion exercises Pendulum  1. Stand near a wall or a surface that you can hold onto for balance. 2. Bend at the waist and let  your left / right arm hang straight down. Use your other arm to support you. Keep your back straight and do not lock your knees. 3. Relax your left / right arm and shoulder muscles, and move your hips and your trunk so your left / right arm swings freely. Your arm should swing because of the motion of your body, not because you are using your arm or shoulder muscles. 4. Keep moving your hips and trunk so your arm swings in the following directions, as told by your health care provider: ? Side to side. ? Forward and backward. ? In clockwise and counterclockwise circles. 5. Continue each motion for __________ seconds, or for as long as told by your health care provider. 6. Slowly return to the starting position. Repeat __________ times. Complete this exercise __________ times a day. Shoulder flexion, standing  1. Stand and hold a broomstick, a cane, or a similar object. Place your hands a little more than shoulder width apart on the object. Your left / right hand should be palm up, and your other hand should be palm down. 2. Keep your elbow straight and your shoulder muscles relaxed. Push the stick up with your healthy arm to raise your left / right arm in front of your body, and then over your head until you feel a stretch in your shoulder (flexion). ? Avoid shrugging your shoulder while you raise your arm. Keep your shoulder blade tucked down toward the middle of your back. 3. Hold for __________ seconds. 4. Slowly return to the starting position. Repeat __________ times. Complete this exercise __________ times a day. Shoulder abduction, standing 1. Stand and hold a broomstick, a cane, or a similar object. Place your hands a little more than shoulder width apart on the object. Your left / right hand should be palm up, and your other hand should be palm down. 2. Keep your elbow straight and your shoulder muscles relaxed. Push the object across your body toward your left / right side. Raise your  left / right arm to the side of your body (abduction) until you feel a stretch in your shoulder. ? Do not raise your arm above shoulder height unless your health care provider tells you to do that. ? If directed, raise your arm over your head. ? Avoid shrugging your shoulder while you raise your arm. Keep your shoulder blade tucked down toward the middle of your back. 3. Hold for __________ seconds. 4. Slowly return to the starting position. Repeat __________ times. Complete this exercise __________ times a day. Internal rotation  1. Place your left / right hand behind your back, palm up. 2. Use your other hand to dangle an exercise band, a towel, or a  similar object over your shoulder. Grasp the band with your left / right hand so you are holding on to both ends. 3. Gently pull up on the band until you feel a stretch in the front of your left / right shoulder. The movement of your arm toward the center of your body is called internal rotation. ? Avoid shrugging your shoulder while you raise your arm. Keep your shoulder blade tucked down toward the middle of your back. 4. Hold for __________ seconds. 5. Release the stretch by letting go of the band and lowering your hands. Repeat __________ times. Complete this exercise __________ times a day. Strengthening exercises External rotation  1. Sit in a stable chair without armrests. 2. Secure an exercise band to a stable object at elbow height on your left / right side. 3. Place a soft object, such as a folded towel or a small pillow, between your left / right upper arm and your body to move your elbow about 4 inches (10 cm) away from your side. 4. Hold the end of the exercise band so it is tight and there is no slack. 5. Keeping your elbow pressed against the soft object, slowly move your forearm out, away from your abdomen (external rotation). Keep your body steady so only your forearm moves. 6. Hold for __________ seconds. 7. Slowly return to  the starting position. Repeat __________ times. Complete this exercise __________ times a day. Shoulder abduction  1. Sit in a stable chair without armrests, or stand up. 2. Hold a __________ weight in your left / right hand, or hold an exercise band with both hands. 3. Start with your arms straight down and your left / right palm facing in, toward your body. 4. Slowly lift your left / right hand out to your side (abduction). Do not lift your hand above shoulder height unless your health care provider tells you that this is safe. ? Keep your arms straight. ? Avoid shrugging your shoulder while you do this movement. Keep your shoulder blade tucked down toward the middle of your back. 5. Hold for __________ seconds. 6. Slowly lower your arm, and return to the starting position. Repeat __________ times. Complete this exercise __________ times a day. Shoulder extension 1. Sit in a stable chair without armrests, or stand up. 2. Secure an exercise band to a stable object in front of you so it is at shoulder height. 3. Hold one end of the exercise band in each hand. Your palms should face each other. 4. Straighten your elbows and lift your hands up to shoulder height. 5. Step back, away from the secured end of the exercise band, until the band is tight and there is no slack. 6. Squeeze your shoulder blades together as you pull your hands down to the sides of your thighs (extension). Stop when your hands are straight down by your sides. Do not let your hands go behind your body. 7. Hold for __________ seconds. 8. Slowly return to the starting position. Repeat __________ times. Complete this exercise __________ times a day. Shoulder row 1. Sit in a stable chair without armrests, or stand up. 2. Secure an exercise band to a stable object in front of you so it is at waist height. 3. Hold one end of the exercise band in each hand. Position your palms so that your thumbs are facing the ceiling (neutral  position). 4. Bend each of your elbows to a 90-degree angle (right angle) and keep your upper arms at  your sides. 5. Step back until the band is tight and there is no slack. 6. Slowly pull your elbows back behind you. 7. Hold for __________ seconds. 8. Slowly return to the starting position. Repeat __________ times. Complete this exercise __________ times a day. Shoulder press-ups  1. Sit in a stable chair that has armrests. Sit upright, with your feet flat on the floor. 2. Put your hands on the armrests so your elbows are bent and your fingers are pointing forward. Your hands should be about even with the sides of your body. 3. Push down on the armrests and use your arms to lift yourself off the chair. Straighten your elbows and lift yourself up as much as you comfortably can. ? Move your shoulder blades down, and avoid letting your shoulders move up toward your ears. ? Keep your feet on the ground. As you get stronger, your feet should support less of your body weight as you lift yourself up. 4. Hold for __________ seconds. 5. Slowly lower yourself back into the chair. Repeat __________ times. Complete this exercise __________ times a day. Wall push-ups  1. Stand so you are facing a stable wall. Your feet should be about one arm-length away from the wall. 2. Lean forward and place your palms on the wall at shoulder height. 3. Keep your feet flat on the floor as you bend your elbows and lean forward toward the wall. 4. Hold for __________ seconds. 5. Straighten your elbows to push yourself back to the starting position. Repeat __________ times. Complete this exercise __________ times a day. This information is not intended to replace advice given to you by your health care provider. Make sure you discuss any questions you have with your health care provider. Document Released: 12/06/2004 Document Revised: 05/16/2018 Document Reviewed: 02/21/2018 [employer] Patient Education  2020 [employer].

## 2018-12-31 NOTE — Progress Notes (Signed)
Telephone Note  I connected with Ashley Cross   on 12/31/18 at  3:30 PM EST by telephone and verified that I am speaking with the correct person using two identifiers.  Location patient: home Location provider:work or home office Persons participating in the virtual visit: patient, provider  I discussed the limitations of evaluation and management by telemedicine and the availability of in person appointments. The patient expressed understanding and agreed to proceed.   HPI: 1. HTN-not checked BP today on micardis 40 mg qd, hctz 12.5 mg qd  2. Weight trending down 121-25 lbs  3. Likely copd anoro approved, albuterol  4. Left shoulder pain and cracking and popping > 1 year she reports she had trauma with walking dog and feel on left shoulder with reduced ROM and cant even blow dry her hair  5. Depression on cymbalta 80 mg qd will confirm dose with wellbutrin xl will confirm dose with Dr. Shea Evans 150 or 300 mg xl   ROS: See pertinent positives and negatives per HPI.  Past Medical History:  Diagnosis Date  . Anxiety   . Breast cancer ([place])    Left breast(nipple mass) 2018  . Cervical stenosis of spine   . Chicken pox   . Chronic back pain   . Colon polyps   . COPD (chronic obstructive pulmonary disease) ([place])   . Cyst of left nipple   . Depression   . Hypertension   . Insomnia   . Personal history of radiation therapy     Past Surgical History:  Procedure Laterality Date  . BACK SURGERY     x 3 (2010 x 2; 2013 x 1)   . BREAST BIOPSY Left    core- neg  . BREAST LUMPECTOMY Left 2018  . BREAST SURGERY Left    breast biopsy  . CERVICAL CONE BIOPSY    . CERVICAL FUSION     times 2  . COLONOSCOPY WITH PROPOFOL N/A 04/18/2015   Procedure: COLONOSCOPY WITH PROPOFOL;  Surgeon: [name], MD;  Location: [facility] ENDOSCOPY;  Service: Endoscopy;  Laterality: N/A;  . ELBOW SURGERY     2014 b/l elbows per pt ulnar nerve   . ELBOW SURGERY     x2   . EPIDURAL BLOCK INJECTION      Dr. Maryjean Ka   . MASS EXCISION Left 04/12/2016   Procedure: EXCISION OF LEFT NIPPLE MASS;  Surgeon: Stark Klein, MD;  Location: [place];  Service: General;  Laterality: Left;  . NECK SURGERY    . POSTERIOR CERVICAL FUSION/FORAMINOTOMY  03/07/2011   Procedure: POSTERIOR CERVICAL FUSION/FORAMINOTOMY LEVEL 4;  Surgeon: Eustace Moore, MD;  Location: [place] NEURO ORS;  Service: Neurosurgery;  Laterality: N/A;  cervical three to thoracic one posterior cervical fusion   . SENTINEL NODE BIOPSY Left 05/29/2016   Procedure: LEFT SENTINEL LYMPH NODE BIOPSY;  Surgeon: Stark Klein, MD;  Location: [place];  Service: General;  Laterality: Left;  . TONSILLECTOMY      Family History  Problem Relation Age of Onset  . Breast cancer Mother 84  . Hypertension Mother   . Cancer Mother        breast dx'ed 105   . Cancer Father        prostate  . Hypertension Father   . Drug abuse Daughter   . Hypertension Maternal Grandmother   . Breast cancer Cousin     SOCIAL HX:  As of 01/25/17 not sexually active of in relationship  Divorced 2  kids son and daughter (though daughter died of suicide in 21-May-2017) Son lives in [place] now as of 12/2018   Disability  Loves animals has 3 dogs    Current Outpatient Medications:  .  albuterol (VENTOLIN HFA) 108 (90 Base) MCG/ACT inhaler, Inhale 1-2 puffs into the lungs every 4 (four) hours as needed for wheezing or shortness of breath., Disp: 18 g, Rfl: 12 .  azelastine (ASTELIN) 0.1 % nasal spray, , Disp: , Rfl:  .  buPROPion (WELLBUTRIN XL) 150 MG 24 hr tablet, Take 1 tablet (150 mg total) by mouth daily. To be combined with 300 mg, Disp: 90 tablet, Rfl: 0 .  buPROPion (WELLBUTRIN XL) 300 MG 24 hr tablet, Take 1 tablet (300 mg total) by mouth daily., Disp: 90 tablet, Rfl: 3 .  cyanocobalamin 100 MCG tablet, Take 100 mcg by mouth daily., Disp: , Rfl:  .  DULoxetine (CYMBALTA) 20 MG capsule, TAKE 1 CAPSULE BY MOUTH ONCE DAILY TO BECOMBINED WITH 60MG  CAP,  Disp: 30 capsule, Rfl: 1 .  DULoxetine (CYMBALTA) 60 MG capsule, Take 1 capsule (60 mg total) by mouth daily. To be combined 20 mg, Disp: 30 capsule, Rfl: 1 .  eszopiclone (LUNESTA) 1 MG TABS tablet, Take 1 tablet (1 mg total) by mouth at bedtime. Take immediately before bedtime, Disp: 30 tablet, Rfl: 1 .  fluticasone (FLONASE) 50 MCG/ACT nasal spray, Place 2 sprays into both nostrils daily. Max 2 sprays, Disp: 16 g, Rfl: 11 .  gabapentin (NEURONTIN) 600 MG tablet, Take 1,200 mg by mouth 3 (three) times daily., Disp: , Rfl:  .  hydrochlorothiazide (HYDRODIURIL) 12.5 MG tablet, Take 1 tablet (12.5 mg total) by mouth daily. In am, Disp: 90 tablet, Rfl: 3 .  hydrOXYzine (VISTARIL) 25 MG capsule, TAKE 1-2 CAPSULES BY MOUTH AT BEDTIME ASNEEDED FOR SEVERE ANXIETY, Disp: 60 capsule, Rfl: 1 .  ipratropium (ATROVENT) 0.06 % nasal spray, Place 2 sprays into both nostrils 3 (three) times daily. prn, Disp: 15 mL, Rfl: 12 .  letrozole (FEMARA) 2.5 MG tablet, Take 1 tablet (2.5 mg total) by mouth daily., Disp: 90 tablet, Rfl: 3 .  montelukast (SINGULAIR) 10 MG tablet, Take 1 tablet (10 mg total) by mouth at bedtime., Disp: 90 tablet, Rfl: 3 .  Multiple Vitamins-Minerals (MULTIVITAMIN) tablet, Take 1 tablet by mouth daily., Disp: 90 tablet, Rfl: 3 .  omeprazole (PRILOSEC) 20 MG capsule, , Disp: , Rfl:  .  oxymorphone (OPANA) 10 MG tablet, Take 10 mg by mouth 2 (two) times daily. , Disp: , Rfl:  .  senna-docusate (SENOKOT-S) 8.6-50 MG tablet, Take 1-2 tablets by mouth daily as needed for mild constipation., Disp: 180 tablet, Rfl: 3 .  telmisartan (MICARDIS) 40 MG tablet, Take 1 tablet (40 mg total) by mouth daily. At night, Disp: 90 tablet, Rfl: 3 .  umeclidinium-vilanterol (ANORO ELLIPTA) 62.5-25 MCG/INH AEPB, Inhale 1 puff into the lungs daily., Disp: 60 each, Rfl: 12 .  Cholecalciferol (D3 ADULT PO), Take by mouth. 10000 IU weekly, Disp: , Rfl:  .  diazepam (VALIUM) 5 MG tablet, Take 1 tablet (5 mg total) by  mouth at bedtime as needed for muscle spasms or anxiety. (Patient not taking: Reported on 12/31/2018), Disp: 30 tablet, Rfl: 5 .  loratadine (CLARITIN) 10 MG tablet, Take 1 tablet (10 mg total) by mouth daily as needed for allergies. At night (Patient not taking: Reported on 12/31/2018), Disp: 90 tablet, Rfl: 3  EXAM:  VITALS per patient if applicable:  GENERAL: alert, oriented, appears  well and in no acute distress  PSYCH/NEURO: pleasant and cooperative, no obvious depression or anxiety, speech and thought processing grossly intact  ASSESSMENT AND PLAN:  Discussed the following assessment and plan:  Essential hypertension  sch fasting labs  On micardis 40 mg hctz 12.5 mg qd  Monitor BP and let me know if not at goal <130/<80  Chronic left shoulder pain - Plan: DG Shoulder Left Consider MRI if Xray   Prediabetes - Plan: HgB A1c  Vitamin D deficiency - Plan: Vitamin D (25 hydroxy)  Tobacco use disorder -rec cessation   Weight loss could be related to anxiety  Monitor for now   Anxiety/depression/insomnia  -f/u with psych and therapy she likes both and doing better since the death of her daughter by suicide in a better state of mind   HM-will do physical at f/u  Flu shot utd Tdap UTD pna 23utd rec shingrix vaccine as wellfor future  Pap from [facility] -02/10/15 neg papwill do at f/uwith labshad to resch labs to another date from 08/19/18  -pap needs to be done at f/u   mammo5/1/20 negative referred today due 06/2019 and f/u h/o 09/2019  -h/o breast cancer left dx neg due in 1 year another diagnostic  -letrozole likely causing sweating/hot flashes she will f/u with h/o in 08/2018  dexa 05/01/17 osteopeniarec calcium and vitamin D  Colonoscopy had 04/18/15 see reporttubular adenomas repeat in 5 years  Hep C neg 01/20/16  Consider HIV testing in future will disc with pt   rec smoking cessationCOPD at least 10 cig per day smoking as of 08/19/18 -anoro  approved and prn albuterol   Saw Dr. Midge Minium chronic neck and back pain  Allergic rhinitis Ambulatory referral to ENT Dr. Tami Ribas or Vaught to w/u hoarseness and allergies Hoarse - Plan: Ambulatory referral to ENT Dr. Tami Ribas or Vaught further w/u  Saw 08/28/18 Rx Astepro 0.1 nasal spray, omeprazole 20 mg qd, medrol 4 mg dose pak noted Reinke Edema and GERD cultures taken +fungal per pt s/p 2 rounds antibiotics I.e Doxycycline may need a 3rd and consider CT neck if persists f/u in 3 weeks   Dr. Pryor Ochoa ENT f/u prn seen week of 12/22/2018 and doing better as of today     -we discussed possible serious and likely etiologies, options for evaluation and workup, limitations of telemedicine visit vs in person visit, treatment, treatment risks and precautions. Pt prefers to treat via telemedicine empirically rather then risking or undertaking an in person visit at this moment. Patient agrees to seek prompt in person care if worsening, new symptoms arise, or if is not improving with treatment.   I discussed the assessment and treatment plan with the patient. The patient was provided an opportunity to ask questions and all were answered. The patient agreed with the plan and demonstrated an understanding of the instructions.   The patient was advised to call back or seek an in-person evaluation if the symptoms worsen or if the condition fails to improve as anticipated.  Time spent 20 minutes  [name], MD

## 2019-01-04 NOTE — Progress Notes (Signed)
Her dose was increased to 450 mg last visit.Patient was asked to combine 300 mg with 150 mg to make 450 mg.

## 2019-01-05 NOTE — Progress Notes (Signed)
Hi  She is on wellbutrin 450 mg - a combination or 300 mg and 150 mg. I had sent an earlier message about this .

## 2019-01-08 ENCOUNTER — Other Ambulatory Visit: Payer: Self-pay

## 2019-01-08 ENCOUNTER — Telehealth: Payer: PPO

## 2019-01-08 ENCOUNTER — Ambulatory Visit (INDEPENDENT_AMBULATORY_CARE_PROVIDER_SITE_OTHER): Payer: PPO | Admitting: Licensed Clinical Social Worker

## 2019-01-08 ENCOUNTER — Encounter: Payer: Self-pay | Admitting: Licensed Clinical Social Worker

## 2019-01-08 DIAGNOSIS — F331 Major depressive disorder, recurrent, moderate: Secondary | ICD-10-CM

## 2019-01-08 NOTE — Progress Notes (Signed)
Virtual Visit via Telephone Note  I connected with Ashley Cross on 01/08/19 at 11:00 AM EST by telephone and verified that I am speaking with the correct person using two identifiers.   I discussed the limitations, risks, security and privacy concerns of performing an evaluation and management service by telephone and the availability of in person appointments. I also discussed with the patient that there may be a patient responsible charge related to this service. The patient expressed understanding and agreed to proceed.  I discussed the assessment and treatment plan with the patient. The patient was provided an opportunity to ask questions and all were answered. The patient agreed with the plan and demonstrated an understanding of the instructions.   The patient was advised to call back or seek an in-person evaluation if the symptoms worsen or if the condition fails to improve as anticipated.  I provided 57 minutes of non-face-to-face time during this encounter.   [name], LCSW   THERAPIST PROGRESS NOTE  Session Time: 1100  Participation Level: Active  Behavioral Response: NeatAlertAnxious  Type of Therapy: Individual Therapy  Treatment Goals addressed: Coping  Interventions: Supportive  Summary: Ashley Cross is a 63 y.o. female who presents with continued symptoms related to her diagnosis. Ashley Cross reports doing "about the same," since our last session. LCSW normalized that experience and encouraged Ashley Cross to discuss what's been going on in her life recently. Ashley Cross reports feeling irritated when a friend told her she "should have gone through this stuff, (of her daughter's) already." LCSW valdiated Ashley Cross's feelings around this subject, and reiterated the idea of setting boundaries regarding others' expectations for how she is processing her grief. Ashley Cross expressed understanding and agreement with this information. Ashley Cross went on to discuss feeling lonely  recently, "my daughter's friend, who promised my daughter she would look after me, has stopped coming over as much." LCSW held space for Ashley Cross to discuss her feelings around her daughter's friend, and feeling uncertain about those in her life. Ashley Cross was able to process her feelings through discussing them, and was in agreement with reaching out to her daughter's friend to let her know she missed spending time with her. LCSW also encouraged Ashley Cross to look for volunteer options or ways she could get out of the house and focused on other things. Ashley Cross brainstormed ideas and reported she could go walking at [facility] with her dog, which is something she used to do more frequently.  Suicidal/Homicidal: No   Therapist Response: Ashley Cross continues to work towards her tx goals but has not yet reached them. We will continue to work on distress tolerance and emotional regulation skills moving forward.   Plan: Return again in 2 weeks.  Diagnosis: Axis I: MDD    Axis II: No diagnosis    [name], LCSW 01/08/2019

## 2019-01-13 ENCOUNTER — Encounter: Payer: Self-pay | Admitting: Psychiatry

## 2019-01-13 ENCOUNTER — Ambulatory Visit (INDEPENDENT_AMBULATORY_CARE_PROVIDER_SITE_OTHER): Payer: PPO | Admitting: Psychiatry

## 2019-01-13 ENCOUNTER — Other Ambulatory Visit: Payer: Self-pay

## 2019-01-13 DIAGNOSIS — Z9189 Other specified personal risk factors, not elsewhere classified: Secondary | ICD-10-CM

## 2019-01-13 DIAGNOSIS — F331 Major depressive disorder, recurrent, moderate: Secondary | ICD-10-CM

## 2019-01-13 DIAGNOSIS — I10 Essential (primary) hypertension: Secondary | ICD-10-CM | POA: Diagnosis not present

## 2019-01-13 DIAGNOSIS — M961 Postlaminectomy syndrome, not elsewhere classified: Secondary | ICD-10-CM | POA: Diagnosis not present

## 2019-01-13 DIAGNOSIS — F17200 Nicotine dependence, unspecified, uncomplicated: Secondary | ICD-10-CM | POA: Diagnosis not present

## 2019-01-13 DIAGNOSIS — F1010 Alcohol abuse, uncomplicated: Secondary | ICD-10-CM

## 2019-01-13 MED ORDER — ARIPIPRAZOLE 2 MG PO TABS: 2.0000 mg | ORAL_TABLET | Freq: Every day | ORAL | 1 refills | Status: DC

## 2019-01-13 MED ORDER — ESZOPICLONE 1 MG PO TABS: 1.0000 mg | ORAL_TABLET | Freq: Every day | ORAL | 1 refills | Status: DC

## 2019-01-13 NOTE — Progress Notes (Signed)
Virtual Visit via Video Note  I connected with Ashley Cross on 01/13/19 at  1:00 PM EST by a video enabled telemedicine application and verified that I am speaking with the correct person using two identifiers.   I discussed the limitations of evaluation and management by telemedicine and the availability of in person appointments. The patient expressed understanding and agreed to proceed.     I discussed the assessment and treatment plan with the patient. The patient was provided an opportunity to ask questions and all were answered. The patient agreed with the plan and demonstrated an understanding of the instructions.   The patient was advised to call back or seek an in-person evaluation if the symptoms worsen or if the condition fails to improve as anticipated.  [facility] MD OP Progress Note  01/13/2019 1:26 PM Ashley Cross  MRN:  [id number]  Chief Complaint:  Chief Complaint    Follow-up     HPI: Ashley Cross is a 63 year old Caucasian female on disability, lives in [place], has a history of MDD, alcohol use disorder, history of breast cancer, chronic pain, hypertension was evaluated by telemedicine today.  Patient today reports she continues to struggle with depressive symptoms like lack of motivation.  She reports she stays in bed all day watching TV.  She does not have any motivation to do anything else around the house.  She does not think the Wellbutrin increased dosage has made any difference.  She has been on that dosage since the past 3 weeks or more.  She denies any side effects.  Patient reports sleep is improved on the Lunesta.  She really likes the [facility] and denies side effects.  She continues to use alcohol-2 glasses of wine per night.  She reports she is trying to cut back.  Provided substance abuse counseling.  Patient is trying to cut back on smoking cigarettes and reports she is making progress.  Patient reports she did not do anything on Thanksgiving day  since all her friends decided to stay to themselves.  She reports she however is looking forward to Christmas holiday since her son will be coming to visit the week after Christmas.  Patient continues to struggle with the loss of her daughter and reports she continues to have trouble going into her room.  She continues to work with the therapist and reports therapy sessions is beneficial.  Patient continues to enjoy her animals and reports they are therapeutic for her.  She also has support system from friends.  Discussed referral for intensive outpatient program since she continues to struggle with depressive symptoms.  Patient however reports she would like to make medication changes and wants to think about it and will let writer know.  She denies any other concerns today. Visit Diagnosis:    ICD-10-CM   1. MDD (major depressive disorder), recurrent episode, moderate (HCC)  F33.1 ARIPiprazole (ABILIFY) 2 MG tablet    eszopiclone (LUNESTA) 1 MG TABS tablet  2. Alcohol use disorder, mild, abuse  F10.10   3. Tobacco use disorder  F17.200   4. At risk for long QT syndrome  Z91.89 EKG 12-Lead    ARIPiprazole (ABILIFY) 2 MG tablet    Past Psychiatric History: I have reviewed past psychiatric history from my progress note on 09/16/2018.  Past trials of Zoloft, Wellbutrin, Lexapro, Celexa  Past Medical History:  Past Medical History:  Diagnosis Date  . Anxiety   . Breast cancer ([place])    Left breast(nipple mass) 2018  .  Cervical stenosis of spine   . Chicken pox   . Chronic back pain   . Colon polyps   . COPD (chronic obstructive pulmonary disease) ([place])   . Cyst of left nipple   . Depression   . Hypertension   . Insomnia   . Personal history of radiation therapy     Past Surgical History:  Procedure Laterality Date  . BACK SURGERY     x 3 (2010 x 2; 2013 x 1)   . BREAST BIOPSY Left    core- neg  . BREAST LUMPECTOMY Left 2018  . BREAST SURGERY Left    breast biopsy  .  CERVICAL CONE BIOPSY    . CERVICAL FUSION     times 2  . COLONOSCOPY WITH PROPOFOL N/A 04/18/2015   Procedure: COLONOSCOPY WITH PROPOFOL;  Surgeon: [name], MD;  Location: [facility] ENDOSCOPY;  Service: Endoscopy;  Laterality: N/A;  . ELBOW SURGERY     2014 b/l elbows per pt ulnar nerve   . ELBOW SURGERY     x2   . EPIDURAL BLOCK INJECTION     Dr. Maryjean Ka   . MASS EXCISION Left 04/12/2016   Procedure: EXCISION OF LEFT NIPPLE MASS;  Surgeon: Stark Klein, MD;  Location: [facility];  Service: General;  Laterality: Left;  . NECK SURGERY    . POSTERIOR CERVICAL FUSION/FORAMINOTOMY  03/07/2011   Procedure: POSTERIOR CERVICAL FUSION/FORAMINOTOMY LEVEL 4;  Surgeon: Eustace Moore, MD;  Location: [place] NEURO ORS;  Service: Neurosurgery;  Laterality: N/A;  cervical three to thoracic one posterior cervical fusion   . SENTINEL NODE BIOPSY Left 05/29/2016   Procedure: LEFT SENTINEL LYMPH NODE BIOPSY;  Surgeon: Stark Klein, MD;  Location: [facility];  Service: General;  Laterality: Left;  . TONSILLECTOMY      Family Psychiatric History: Reviewed family psychiatric history from my progress note on 09/16/2018.  Family History:  Family History  Problem Relation Age of Onset  . Breast cancer Mother 19  . Hypertension Mother   . Cancer Mother        breast dx'ed 86   . Cancer Father        prostate  . Hypertension Father   . Drug abuse Daughter   . Hypertension Maternal Grandmother   . Breast cancer Cousin     Social History: Reviewed social history from my progress note on 09/16/2018. Social History   Socioeconomic History  . Marital status: Divorced    Spouse name: Not on file  . Number of children: 2  . Years of education: Not on file  . Highest education level: Not on file  Occupational History  . Not on file  Social Needs  . Financial resource strain: Not on file  . Food insecurity    Worry: Not on file    Inability: Not on file  . Transportation needs    Medical: Not  on file    Non-medical: Not on file  Tobacco Use  . Smoking status: Current Every Day Smoker    Packs/day: 0.50    Years: 30.00    Pack years: 15.00    Types: Cigarettes  . Smokeless tobacco: Never Used  . Tobacco comment: cutting back - half pask reported 01/13/2019  Substance and Sexual Activity  . Alcohol use: Yes    Comment: social  . Drug use: No    Comment: on opana since jan 2018  . Sexual activity: Not on file  Lifestyle  . Physical activity  Days per week: Not on file    Minutes per session: Not on file  . Stress: Not on file  Relationships  . Social Herbalist on phone: Not on file    Gets together: Not on file    Attends religious service: Not on file    Active member of club or organization: Not on file    Attends meetings of clubs or organizations: Not on file    Relationship status: Not on file  Other Topics Concern  . Not on file  Social History Narrative   As of 01/25/17 not sexually active of in relationship    Divorced   2 kids son and daughter (though daughter died of suicide in 23-May-2017)   Son lives in [place] now as of 12/2018       Juana Di­az has 3 dogs            Allergies:  Allergies  Allergen Reactions  . Sulfa Antibiotics Itching and Rash  . Sulfonamide Derivatives Itching and Rash    Metabolic Disorder Labs: Lab Results  Component Value Date   HGBA1C 6.1 02/13/2018   No results found for: PROLACTIN Lab Results  Component Value Date   CHOL 178 02/13/2018   TRIG 86.0 02/13/2018   HDL 80.60 02/13/2018   CHOLHDL 2 02/13/2018   VLDL 17.2 02/13/2018   LDLCALC 81 02/13/2018   LDLCALC 59 01/25/2017   Lab Results  Component Value Date   TSH 2.97 02/13/2018   TSH 0.95 01/25/2017    Therapeutic Level Labs: No results found for: LITHIUM No results found for: VALPROATE No components found for:  CBMZ  Current Medications: Current Outpatient Medications  Medication Sig Dispense Refill  . albuterol  (VENTOLIN HFA) 108 (90 Base) MCG/ACT inhaler Inhale 1-2 puffs into the lungs every 4 (four) hours as needed for wheezing or shortness of breath. 18 g 12  . ARIPiprazole (ABILIFY) 2 MG tablet Take 1 tablet (2 mg total) by mouth daily with breakfast. 30 tablet 1  . azelastine (ASTELIN) 0.1 % nasal spray     . buPROPion (WELLBUTRIN XL) 150 MG 24 hr tablet Take 1 tablet (150 mg total) by mouth daily. To be combined with 300 mg 90 tablet 0  . buPROPion (WELLBUTRIN XL) 300 MG 24 hr tablet Take 1 tablet (300 mg total) by mouth daily. 90 tablet 3  . Cholecalciferol (D3 ADULT PO) Take by mouth. 10000 IU weekly    . cyanocobalamin 100 MCG tablet Take 100 mcg by mouth daily.    . diazepam (VALIUM) 5 MG tablet Take 1 tablet (5 mg total) by mouth at bedtime as needed for muscle spasms or anxiety. (Patient not taking: Reported on 12/31/2018) 30 tablet 5  . DULoxetine (CYMBALTA) 20 MG capsule TAKE 1 CAPSULE BY MOUTH ONCE DAILY TO BECOMBINED WITH 60MG  CAP 30 capsule 1  . DULoxetine (CYMBALTA) 60 MG capsule Take 1 capsule (60 mg total) by mouth daily. To be combined 20 mg 30 capsule 1  . eszopiclone (LUNESTA) 1 MG TABS tablet Take 1 tablet (1 mg total) by mouth at bedtime. Take immediately before bedtime 30 tablet 1  . fluconazole (DIFLUCAN) 100 MG tablet Take 100 mg by mouth daily.    . fluticasone (FLONASE) 50 MCG/ACT nasal spray Place 2 sprays into both nostrils daily. Max 2 sprays 16 g 11  . gabapentin (NEURONTIN) 600 MG tablet Take 1,200 mg by mouth 3 (three) times daily.    Marland Kitchen  hydrochlorothiazide (HYDRODIURIL) 12.5 MG tablet Take 1 tablet (12.5 mg total) by mouth daily. In am 90 tablet 3  . hydrOXYzine (VISTARIL) 25 MG capsule TAKE 1-2 CAPSULES BY MOUTH AT BEDTIME ASNEEDED FOR SEVERE ANXIETY 60 capsule 1  . ipratropium (ATROVENT) 0.06 % nasal spray Place 2 sprays into both nostrils 3 (three) times daily. prn 15 mL 12  . letrozole (FEMARA) 2.5 MG tablet Take 1 tablet (2.5 mg total) by mouth daily. 90 tablet 3   . loratadine (CLARITIN) 10 MG tablet Take 1 tablet (10 mg total) by mouth daily as needed for allergies. At night (Patient not taking: Reported on 12/31/2018) 90 tablet 3  . montelukast (SINGULAIR) 10 MG tablet Take 1 tablet (10 mg total) by mouth at bedtime. 90 tablet 3  . Multiple Vitamins-Minerals (MULTIVITAMIN) tablet Take 1 tablet by mouth daily. 90 tablet 3  . omeprazole (PRILOSEC) 20 MG capsule     . oxymorphone (OPANA) 10 MG tablet Take 10 mg by mouth 2 (two) times daily.     Marland Kitchen senna-docusate (SENOKOT-S) 8.6-50 MG tablet Take 1-2 tablets by mouth daily as needed for mild constipation. 180 tablet 3  . telmisartan (MICARDIS) 40 MG tablet Take 1 tablet (40 mg total) by mouth daily. At night 90 tablet 3  . umeclidinium-vilanterol (ANORO ELLIPTA) 62.5-25 MCG/INH AEPB Inhale 1 puff into the lungs daily. 60 each 12   No current facility-administered medications for this visit.      Musculoskeletal: Strength & Muscle Tone: UTA Gait & Station: normal Patient leans: N/A  Psychiatric Specialty Exam: Review of Systems  Psychiatric/Behavioral: Positive for depression and substance abuse.  All other systems reviewed and are negative.   There were no vitals taken for this visit.There is no height or weight on file to calculate BMI.  General Appearance: Casual  Eye Contact:  Fair  Speech:  Clear and Coherent  Volume:  Normal  Mood:  Depressed  Affect:  Tearful  Thought Process:  Goal Directed and Descriptions of Associations: Intact  Orientation:  Full (Time, Place, and Person)  Thought Content: Logical   Suicidal Thoughts:  No  Homicidal Thoughts:  No  Memory:  Immediate;   Fair Recent;   Fair Remote;   Fair  Judgement:  Fair  Insight:  Fair  Psychomotor Activity:  Normal  Concentration:  Concentration: Fair and Attention Span: Fair  Recall:  [employer] of Knowledge: Fair  Language: Fair  Akathisia:  No  Handed:  Right  AIMS (if indicated): Denies tremors, rigidity  Assets:   Communication Skills Desire for Improvement Social Support  ADL's:  Intact  Cognition: WNL  Sleep:  Fair   Screenings: GAD-7     Office Visit from 09/30/2017 in [facility]  Total GAD-7 Score  18    PHQ2-9     Office Visit from 12/31/2018 in [facility] from 09/16/2018 in [facility] Visit from 09/30/2017 in [facility] Office Visit from 08/19/2017 in [facility] from 06/13/2016 in [facility]  PHQ-2 Total Score  0  0  4  4  1   PHQ-9 Total Score  -  -  18  16  -       Assessment and Plan: Ashley Cross is a 63 year old Caucasian female, divorced, on disability, has a history of depression, chronic pain, hypertension, history of breast cancer was evaluated by telemedicine today.  She is biologically  predisposed given her family history as well as substance abuse problems.  She also has psychosocial stressors of her health problems, financial problems, death of her daughter a year ago.  Patient continues to struggle with depressive symptoms and reports she has not noticed much benefit from the Wellbutrin.  Patient also has comorbid substance abuse problems-alcoholism although trying to cut back.  Patient will benefit from continued medication readjustment and psychotherapy sessions and possible referral to intensive outpatient program.  Plan as noted below.  Plan MDD-unstable Cymbalta 80 mg p.o. daily We will continue Wellbutrin XL 450 mg p.o. daily for now. Add Abilify 2 mg p.o. daily.  If she responds well to Abilify she can be tapered off of the Wellbutrin gradually.  Will not make any changes today. Lunesta 1 mg p.o. nightly for sleep-she reports it is helpful. Hydroxyzine 25 mg p.o. daily as needed for anxiety as well as sleep as needed. Continue CBT with Ms. [name]. Discussed referral to intensive outpatient  program-she will think about it and let writer know.  Alcohol use disorder mild-improving Provided substance abuse counseling. Discussed with patient the effect of alcohol on her mood symptoms, discussed with her that alcohol is a CNS depressant and can make her depressive symptoms worse.  Tobacco use disorder-improving Provided smoking cessation counseling  Patient will benefit from the following labs-TSH, lipid panel, hemoglobin A1c-pending.  She reports she will get it done at her primary care office.  Patient will also benefit from EKG to monitor her QTC-she will talk to her PMD.  Follow-up in clinic in 3 to 4 weeks or sooner if needed.  January 7 at 1:20 PM  I have spent atleast 25 minutes non face to face with patient today. More than 50 % of the time was spent for psychoeducation and supportive psychotherapy and care coordination. This note was generated in part or whole with voice recognition software. Voice recognition is usually quite accurate but there are transcription errors that can and very often do occur. I apologize for any typographical errors that were not detected and corrected.        Ursula Alert, MD 01/13/2019, 1:26 PM

## 2019-01-14 ENCOUNTER — Other Ambulatory Visit: Payer: Self-pay | Admitting: Internal Medicine

## 2019-01-14 DIAGNOSIS — G4700 Insomnia, unspecified: Secondary | ICD-10-CM

## 2019-01-14 DIAGNOSIS — F419 Anxiety disorder, unspecified: Secondary | ICD-10-CM

## 2019-01-14 MED ORDER — DIAZEPAM 5 MG PO TABS: 5.0000 mg | ORAL_TABLET | Freq: Every evening | ORAL | 5 refills | Status: DC | PRN

## 2019-01-15 ENCOUNTER — Ambulatory Visit
Admission: RE | Admit: 2019-01-15 | Discharge: 2019-01-15 | Disposition: A | Discharge status: Home / Self Care | Payer: PPO | Source: Ambulatory Visit | Attending: Internal Medicine | Admitting: Internal Medicine

## 2019-01-15 ENCOUNTER — Ambulatory Visit
Admission: RE | Admit: 2019-01-15 | Discharge: 2019-01-15 | Disposition: A | Discharge status: Home / Self Care | Payer: PPO | Attending: Internal Medicine | Admitting: Internal Medicine

## 2019-01-15 ENCOUNTER — Other Ambulatory Visit: Payer: Self-pay

## 2019-01-15 DIAGNOSIS — M19012 Primary osteoarthritis, left shoulder: Secondary | ICD-10-CM | POA: Diagnosis not present

## 2019-01-15 DIAGNOSIS — M25512 Pain in left shoulder: Secondary | ICD-10-CM | POA: Diagnosis not present

## 2019-01-15 DIAGNOSIS — G8929 Other chronic pain: Secondary | ICD-10-CM | POA: Insufficient documentation

## 2019-01-16 ENCOUNTER — Other Ambulatory Visit: Payer: Self-pay | Admitting: Internal Medicine

## 2019-01-16 ENCOUNTER — Telehealth: Payer: Self-pay | Admitting: *Deleted

## 2019-01-16 DIAGNOSIS — M67912 Unspecified disorder of synovium and tendon, left shoulder: Secondary | ICD-10-CM

## 2019-01-16 DIAGNOSIS — M19019 Primary osteoarthritis, unspecified shoulder: Secondary | ICD-10-CM

## 2019-01-16 NOTE — Telephone Encounter (Signed)
Copied from [place] [id number]. Topic: General - Other >> Jan 16, 2019  9:14 AM Rainey Pines A wrote: Pt returned Sarahs call to go over imaging results and would like a callback

## 2019-01-16 NOTE — Telephone Encounter (Signed)
I called patient & went over lab results.

## 2019-01-22 ENCOUNTER — Ambulatory Visit (INDEPENDENT_AMBULATORY_CARE_PROVIDER_SITE_OTHER): Payer: PPO | Admitting: Pharmacist

## 2019-01-22 ENCOUNTER — Telehealth: Payer: Self-pay | Admitting: Internal Medicine

## 2019-01-22 DIAGNOSIS — F339 Major depressive disorder, recurrent, unspecified: Secondary | ICD-10-CM

## 2019-01-22 DIAGNOSIS — I10 Essential (primary) hypertension: Secondary | ICD-10-CM

## 2019-01-22 DIAGNOSIS — J449 Chronic obstructive pulmonary disease, unspecified: Secondary | ICD-10-CM

## 2019-01-22 NOTE — Addendum Note (Signed)
Addended by: Orland Mustard on: 01/22/2019 01:13 PM   Modules accepted: Orders

## 2019-01-22 NOTE — Telephone Encounter (Signed)
Call pt and sch fasting labs asap   Thanks TMS 
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
## 2019-01-22 NOTE — Telephone Encounter (Signed)
Patient scheduled for fasting labs 02/27/19.

## 2019-01-22 NOTE — Addendum Note (Signed)
Addended by: Orland Mustard on: 01/22/2019 01:12 PM   Modules accepted: Orders
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
## 2019-01-26 ENCOUNTER — Encounter: Payer: Self-pay | Admitting: Licensed Clinical Social Worker

## 2019-01-26 ENCOUNTER — Ambulatory Visit (INDEPENDENT_AMBULATORY_CARE_PROVIDER_SITE_OTHER): Payer: PPO | Admitting: Licensed Clinical Social Worker

## 2019-01-26 ENCOUNTER — Other Ambulatory Visit: Payer: Self-pay

## 2019-01-26 DIAGNOSIS — F331 Major depressive disorder, recurrent, moderate: Secondary | ICD-10-CM

## 2019-01-26 NOTE — Progress Notes (Signed)
  Virtual Visit via Video Note  I connected with Dashay Marchuk on 01/26/19 at  8:00 AM EST by a video enabled telemedicine application and verified that I am speaking with the correct person using two identifiers.   I discussed the limitations of evaluation and management by telemedicine and the availability of in person appointments. The patient expressed understanding and agreed to proceed.  I discussed the assessment and treatment plan with the patient. The patient was provided an opportunity to ask questions and all were answered. The patient agreed with the plan and demonstrated an understanding of the instructions.   The patient was advised to call back or seek an in-person evaluation if the symptoms worsen or if the condition fails to improve as anticipated.  I provided 56 minutes of non-face-to-face time during this encounter.   [name], LCSW   THERAPIST PROGRESS NOTE  Session Time: 0800  Participation Level: Active  Behavioral Response: NeatAlertDepressed  Type of Therapy: Individual Therapy  Treatment Goals addressed: Coping  Interventions: CBT  Summary: Jacelle Macintosh is a 63 y.o. female who presents with continued symptoms related to her diagnosis. Ryeleigh reports doing "fair," since our last session. She reports having moments where she is tearful and more sad than others, "and I'm not feeling as guilty for not doing things as I was--but I have more days that I don't do much than I'd like." LCSW validated Denali's feelings around this transition into not feeling guilty when allowing our bodies to rest. LCSW encouraged Camyah to continue allowing herself downtime to grieve and process as she needs to. Lennice expressed understanding and agreement. Kaite reported currently she has no heat, but has gas logs so is able to stay warm. She reports something is broken and she does not currently have the money to fix it. LCSW validated her feelings and  encouraged her to reach out to family members if she needs to in order to have a warm, safe place to sleep. Vader expressed understanding and agreement. LCSW held space for Maritess to discuss various memories of her daughter during the holidays, and encouraged Willeen to begin making new traditions that center around things that make her happy. Orvilla expressed agreement with this idea and stated, "I know what I need to do!"  Suicidal/Homicidal: No   Therapist Response: Gweneth continues to work towards her tx goals but has not yet reached them. We will continue to work on improving emotional regulation and distress tolerance while processing grief.   Plan: Return again in 2 weeks.  Diagnosis: Axis I: MDD    Axis II: No diagnosis    [name], LCSW 01/26/2019

## 2019-01-27 ENCOUNTER — Other Ambulatory Visit: Payer: Self-pay | Admitting: Internal Medicine

## 2019-01-27 DIAGNOSIS — I10 Essential (primary) hypertension: Secondary | ICD-10-CM

## 2019-01-27 MED ORDER — HYDROCHLOROTHIAZIDE 12.5 MG PO TABS: 12.5000 mg | ORAL_TABLET | Freq: Every day | ORAL | 3 refills | Status: DC

## 2019-01-29 ENCOUNTER — Telehealth: Payer: Self-pay | Admitting: *Deleted

## 2019-01-29 NOTE — Patient Outreach (Signed)
[facility]) Care Management  01/29/2019  Jeroldine Prime [birth year] [id number]   CSW received call from patient thanking for the resources mailed to patient and apologized for not reaching out sooner. Patient states that she is still struggling with prioritizing her needs. CSW reviewed resources mailed to patient and suggested that she call [place] Phone: [phone], [facility] [place] is a division of [facility] [employer] office address: [address] [place] [address] phone#: [phone]. Patient declined need for grief counseling as she states she has been going to counseling every 2 weeks and has been helping. Patient states that she plans to reach out to [facility] and other resources, declined need for food pantries at this time. CSW will follow-up in 1 month.    Raynaldo Opitz, LCSW [facility]  Clinical Social Worker cell #: [phone]

## 2019-02-03 ENCOUNTER — Other Ambulatory Visit: Payer: Self-pay | Admitting: Internal Medicine

## 2019-02-03 DIAGNOSIS — R0981 Nasal congestion: Secondary | ICD-10-CM

## 2019-02-03 DIAGNOSIS — R0982 Postnasal drip: Secondary | ICD-10-CM

## 2019-02-03 MED ORDER — FLUTICASONE PROPIONATE 50 MCG/ACT NA SUSP: 2.0000 | Freq: Every day | NASAL | 11 refills | Status: DC

## 2019-02-12 ENCOUNTER — Encounter: Payer: Self-pay | Admitting: Psychiatry

## 2019-02-12 ENCOUNTER — Ambulatory Visit (INDEPENDENT_AMBULATORY_CARE_PROVIDER_SITE_OTHER): Payer: PPO | Admitting: Psychiatry

## 2019-02-12 ENCOUNTER — Other Ambulatory Visit: Payer: Self-pay

## 2019-02-12 DIAGNOSIS — F17200 Nicotine dependence, unspecified, uncomplicated: Secondary | ICD-10-CM | POA: Diagnosis not present

## 2019-02-12 DIAGNOSIS — F1010 Alcohol abuse, uncomplicated: Secondary | ICD-10-CM | POA: Diagnosis not present

## 2019-02-12 DIAGNOSIS — F331 Major depressive disorder, recurrent, moderate: Secondary | ICD-10-CM | POA: Diagnosis not present

## 2019-02-12 MED ORDER — ARIPIPRAZOLE 5 MG PO TABS: 5.0000 mg | ORAL_TABLET | Freq: Every day | ORAL | 1 refills | Status: DC

## 2019-02-12 NOTE — Progress Notes (Signed)
Virtual Visit via Video Note  I connected with Ashley Cross on 02/12/19 at  1:20 PM EST by a video enabled telemedicine application and verified that I am speaking with the correct person using two identifiers.   I discussed the limitations of evaluation and management by telemedicine and the availability of in person appointments. The patient expressed understanding and agreed to proceed.     I discussed the assessment and treatment plan with the patient. The patient was provided an opportunity to ask questions and all were answered. The patient agreed with the plan and demonstrated an understanding of the instructions.   The patient was advised to call back or seek an in-person evaluation if the symptoms worsen or if the condition fails to improve as anticipated.   [place] MD OP Progress Note  02/12/2019 6:07 PM Ashley Cross  MRN:  [id number]  Chief Complaint:  Chief Complaint    Follow-up     HPI: Ashley Cross is a 64 year old Caucasian female on disability, lives in [place], has a history of MDD, alcohol use disorder, history of breast cancer, chronic pain, hypertension was evaluated by telemedicine today.  Patient reports she is currently struggling since she has to put down her cat of 19 years.  The cat is really sick.  Patient became very tearful when she discussed this.  She reports sleep is better on the Lunesta.  She reports she is very cautious about using alcohol and is limiting use.  She reports the holidays went well since her son gave her a surprise visit.  That was good.  Patient denies any suicidal thoughts, homicidality or perceptual disturbances.  Patient reports she has upcoming appointment with her therapist Ms. [name].  She plans to keep it.  Patient denies any other concerns today. Visit Diagnosis:    ICD-10-CM   1. MDD (major depressive disorder), recurrent episode, moderate (HCC)  F33.1 ARIPiprazole (ABILIFY) 5 MG tablet  2. Alcohol use  disorder, mild, abuse  F10.10   3. Tobacco use disorder  F17.200     Past Psychiatric History: I have reviewed past psychiatric history from my progress note on 09/16/2018.  Past trials of Zoloft, Wellbutrin, Lexapro, Celexa  Past Medical History:  Past Medical History:  Diagnosis Date  . Anxiety   . Breast cancer ([place])    Left breast(nipple mass) 2018  . Cervical stenosis of spine   . Chicken pox   . Chronic back pain   . Colon polyps   . COPD (chronic obstructive pulmonary disease) ([place])   . Cyst of left nipple   . Depression   . Hypertension   . Insomnia   . Personal history of radiation therapy     Past Surgical History:  Procedure Laterality Date  . BACK SURGERY     x 3 (2010 x 2; 2013 x 1)   . BREAST BIOPSY Left    core- neg  . BREAST LUMPECTOMY Left 2018  . BREAST SURGERY Left    breast biopsy  . CERVICAL CONE BIOPSY    . CERVICAL FUSION     times 2  . COLONOSCOPY WITH PROPOFOL N/A 04/18/2015   Procedure: COLONOSCOPY WITH PROPOFOL;  Surgeon: [name], MD;  Location: [facility] ENDOSCOPY;  Service: Endoscopy;  Laterality: N/A;  . ELBOW SURGERY     2014 b/l elbows per pt ulnar nerve   . ELBOW SURGERY     x2   . EPIDURAL BLOCK INJECTION     Dr. Maryjean Ka   .  MASS EXCISION Left 04/12/2016   Procedure: EXCISION OF LEFT NIPPLE MASS;  Surgeon: Stark Klein, MD;  Location: [facility];  Service: General;  Laterality: Left;  . NECK SURGERY    . POSTERIOR CERVICAL FUSION/FORAMINOTOMY  03/07/2011   Procedure: POSTERIOR CERVICAL FUSION/FORAMINOTOMY LEVEL 4;  Surgeon: Eustace Moore, MD;  Location: [facility] NEURO ORS;  Service: Neurosurgery;  Laterality: N/A;  cervical three to thoracic one posterior cervical fusion   . SENTINEL NODE BIOPSY Left 05/29/2016   Procedure: LEFT SENTINEL LYMPH NODE BIOPSY;  Surgeon: Stark Klein, MD;  Location: [facility];  Service: General;  Laterality: Left;  . TONSILLECTOMY      Family Psychiatric History: I have reviewed family  psychiatric history from my progress note on 09/16/2018.  Family History:  Family History  Problem Relation Age of Onset  . Breast cancer Mother 69  . Hypertension Mother   . Cancer Mother        breast dx'ed 54   . Cancer Father        prostate  . Hypertension Father   . Drug abuse Daughter   . Hypertension Maternal Grandmother   . Breast cancer Cousin     Social History: Reviewed social history from my progress note on 09/16/2018. Social History   Socioeconomic History  . Marital status: Divorced    Spouse name: Not on file  . Number of children: 2  . Years of education: Not on file  . Highest education level: Not on file  Occupational History  . Not on file  Tobacco Use  . Smoking status: Current Every Day Smoker    Packs/day: 0.50    Years: 30.00    Pack years: 15.00    Types: Cigarettes  . Smokeless tobacco: Never Used  . Tobacco comment: cutting back - half pask reported 01/13/2019  Substance and Sexual Activity  . Alcohol use: Yes    Comment: social  . Drug use: No    Comment: on opana since jan 2018  . Sexual activity: Not on file  Other Topics Concern  . Not on file  Social History Narrative   As of 01/25/17 not sexually active of in relationship    Divorced   2 kids son and daughter (though daughter died of suicide in 05-24-17)   Son lives in [place] now as of 12/2018       Disability    Loves animals has 3 dogs           Social Determinants of Radio broadcast assistant Strain:   . Difficulty of Paying Living Expenses: Not on file  Food Insecurity:   . Worried About Charity fundraiser in the Last Year: Not on file  . Ran Out of Food in the Last Year: Not on file  Transportation Needs:   . Lack of Transportation (Medical): Not on file  . Lack of Transportation (Non-Medical): Not on file  Physical Activity:   . Days of Exercise per Week: Not on file  . Minutes of Exercise per Session: Not on file  Stress:   . Feeling of Stress : Not on file   Social Connections:   . Frequency of Communication with Friends and Family: Not on file  . Frequency of Social Gatherings with Friends and Family: Not on file  . Attends Religious Services: Not on file  . Active Member of Clubs or Organizations: Not on file  . Attends Archivist Meetings: Not on file  .  Marital Status: Not on file    Allergies:  Allergies  Allergen Reactions  . Sulfa Antibiotics Itching and Rash  . Sulfonamide Derivatives Itching and Rash    Metabolic Disorder Labs: Lab Results  Component Value Date   HGBA1C 6.1 02/13/2018   No results found for: PROLACTIN Lab Results  Component Value Date   CHOL 178 02/13/2018   TRIG 86.0 02/13/2018   HDL 80.60 02/13/2018   CHOLHDL 2 02/13/2018   VLDL 17.2 02/13/2018   LDLCALC 81 02/13/2018   LDLCALC 59 01/25/2017   Lab Results  Component Value Date   TSH 2.97 02/13/2018   TSH 0.95 01/25/2017    Therapeutic Level Labs: No results found for: LITHIUM No results found for: VALPROATE No components found for:  CBMZ  Current Medications: Current Outpatient Medications  Medication Sig Dispense Refill  . albuterol (VENTOLIN HFA) 108 (90 Base) MCG/ACT inhaler Inhale 1-2 puffs into the lungs every 4 (four) hours as needed for wheezing or shortness of breath. 18 g 12  . ARIPiprazole (ABILIFY) 5 MG tablet Take 1 tablet (5 mg total) by mouth daily. 30 tablet 1  . azelastine (ASTELIN) 0.1 % nasal spray     . buPROPion (WELLBUTRIN XL) 300 MG 24 hr tablet Take 1 tablet (300 mg total) by mouth daily. 90 tablet 3  . Cholecalciferol (D3 ADULT PO) Take by mouth. 10000 IU weekly    . cyanocobalamin 100 MCG tablet Take 100 mcg by mouth daily.    . diazepam (VALIUM) 5 MG tablet Take 1 tablet (5 mg total) by mouth at bedtime as needed for muscle spasms or anxiety. (Patient not taking: Reported on 01/22/2019) 30 tablet 5  . DULoxetine (CYMBALTA) 20 MG capsule TAKE 1 CAPSULE BY MOUTH ONCE DAILY TO BECOMBINED WITH 60MG  CAP 30  capsule 1  . DULoxetine (CYMBALTA) 60 MG capsule Take 1 capsule (60 mg total) by mouth daily. To be combined 20 mg 30 capsule 1  . eszopiclone (LUNESTA) 1 MG TABS tablet Take 1 tablet (1 mg total) by mouth at bedtime. Take immediately before bedtime 30 tablet 1  . fluticasone (FLONASE) 50 MCG/ACT nasal spray Place 2 sprays into both nostrils daily. Max 2 sprays 16 g 11  . gabapentin (NEURONTIN) 600 MG tablet Take 1,200 mg by mouth 3 (three) times daily.    . hydrochlorothiazide (HYDRODIURIL) 12.5 MG tablet Take 1 tablet (12.5 mg total) by mouth daily. In am 90 tablet 3  . hydrOXYzine (VISTARIL) 25 MG capsule TAKE 1-2 CAPSULES BY MOUTH AT BEDTIME ASNEEDED FOR SEVERE ANXIETY 60 capsule 1  . ipratropium (ATROVENT) 0.06 % nasal spray Place 2 sprays into both nostrils 3 (three) times daily. prn 15 mL 12  . letrozole (FEMARA) 2.5 MG tablet Take 1 tablet (2.5 mg total) by mouth daily. 90 tablet 3  . loratadine (CLARITIN) 10 MG tablet Take 1 tablet (10 mg total) by mouth daily as needed for allergies. At night (Patient not taking: Reported on 01/22/2019) 90 tablet 3  . montelukast (SINGULAIR) 10 MG tablet Take 1 tablet (10 mg total) by mouth at bedtime. 90 tablet 3  . Multiple Vitamins-Minerals (MULTIVITAMIN) tablet Take 1 tablet by mouth daily. (Patient not taking: Reported on 01/22/2019) 90 tablet 3  . omeprazole (PRILOSEC) 20 MG capsule     . oxymorphone (OPANA) 10 MG tablet Take 10 mg by mouth 2 (two) times daily.     Marland Kitchen senna-docusate (SENOKOT-S) 8.6-50 MG tablet Take 1-2 tablets by mouth daily as needed for mild  constipation. 180 tablet 3  . telmisartan (MICARDIS) 40 MG tablet Take 1 tablet (40 mg total) by mouth daily. At night 90 tablet 3  . umeclidinium-vilanterol (ANORO ELLIPTA) 62.5-25 MCG/INH AEPB Inhale 1 puff into the lungs daily. 60 each 12   No current facility-administered medications for this visit.     Musculoskeletal: Strength & Muscle Tone: UTA Gait & Station: normal Patient  leans: N/A  Psychiatric Specialty Exam: Review of Systems  Psychiatric/Behavioral: Positive for dysphoric mood.  All other systems reviewed and are negative.   There were no vitals taken for this visit.There is no height or weight on file to calculate BMI.  General Appearance: Casual  Eye Contact:  Fair  Speech:  Clear and Coherent  Volume:  Normal  Mood:  Depressed  Affect:  Tearful  Thought Process:  Goal Directed and Descriptions of Associations: Intact  Orientation:  Full (Time, Place, and Person)  Thought Content: Logical   Suicidal Thoughts:  No  Homicidal Thoughts:  No  Memory:  Immediate;   Fair Recent;   Fair Remote;   Fair  Judgement:  Fair  Insight:  Fair  Psychomotor Activity:  Normal  Concentration:  Concentration: Fair and Attention Span: Fair  Recall:  [employer] of Knowledge: Fair  Language: Fair  Akathisia:  No  Handed:  Right  AIMS (if indicated): Denies tremors, rigidity  Assets:  Communication Skills Desire for Improvement Housing Social Support  ADL's:  Intact  Cognition: WNL  Sleep:  Fair   Screenings: GAD-7     Office Visit from 09/30/2017 in [facility]  Total GAD-7 Score  18    PHQ2-9     Office Visit from 12/31/2018 in [facility] from 09/16/2018 in [facility] Visit from 09/30/2017 in [facility] Office Visit from 08/19/2017 in [facility] from 06/13/2016 in [facility]  PHQ-2 Total Score  0  0  4  4  1   PHQ-9 Total Score  --  --  18  16  --       Assessment and Plan: Ashley Cross is a 64 year old Caucasian female, divorced, on disability, has a history of depression, chronic pain, hypertension, history of breast cancer was evaluated by telemedicine today.  She is biologically predisposed given her family history as well as substance abuse problems.  She also has psychosocial  stressors of her health problems, financial problems, death of her daughter a year ago as well as the fact that she has to put down her cat.  Patient also with comorbid substance abuse problems however is currently cutting back.  Patient will continue to benefit from medication readjustment for depression as well as continued psychotherapy sessions.  Plan MDD-some progress Cymbalta 80 mg p.o. daily Reduce Wellbutrin XL to 300 mg p.o. daily. Increase Abilify to 5 mg p.o. daily Lunesta 1 mg p.o. nightly for sleep. Hydroxyzine 25 mg p.o. daily as needed for anxiety as well as sleep as needed Continue CBT with Ms. [name]  Alcohol use disorder mild-improving Provided substance abuse counseling-he is cutting back.  Tobacco use disorder-improving Provided smoking cessation counseling.  Pending labs-TSH, lipid panel, hemoglobin A1c.  Pending EKG to monitor QTC-patient will get it from her primary care provider.  Follow-up in clinic in 4 weeks or sooner if needed.  February 11 at 2:20 PM  I have spent atleast 20 minutes non face to face with  patient today. More than 50 % of the time was spent for ordering medications and test ,psychoeducation and supportive psychotherapy and care coordination,as well as documenting clinical information in electronic health record. This note was generated in part or whole with voice recognition software. Voice recognition is usually quite accurate but there are transcription errors that can and very often do occur. I apologize for any typographical errors that were not detected and corrected.         Ashley Alert, MD 02/12/2019, 6:07 PM

## 2019-02-19 ENCOUNTER — Other Ambulatory Visit: Payer: PPO

## 2019-02-25 ENCOUNTER — Other Ambulatory Visit: Payer: Self-pay

## 2019-02-26 ENCOUNTER — Ambulatory Visit: Payer: PPO | Admitting: Licensed Clinical Social Worker

## 2019-02-26 ENCOUNTER — Ambulatory Visit (INDEPENDENT_AMBULATORY_CARE_PROVIDER_SITE_OTHER): Payer: PPO | Admitting: Pharmacist

## 2019-02-26 DIAGNOSIS — J449 Chronic obstructive pulmonary disease, unspecified: Secondary | ICD-10-CM

## 2019-02-26 DIAGNOSIS — Z720 Tobacco use: Secondary | ICD-10-CM

## 2019-02-26 NOTE — Chronic Care Management (AMB) (Signed)
Chronic Care Management   Follow Up Note   02/26/2019 Name: Charolett Aime MRN: [id number] DOB: 1956/01/23  Referred by: McLean-Scocuzza, Nino Glow, MD Reason for referral : Chronic Care Management (Medication Management)   Emelee Kieper is a 64 y.o. year old female who is a primary care patient of McLean-Scocuzza, Nino Glow, MD. The CCM team was consulted for assistance with chronic disease management and care coordination needs.    Contacted patient for medication management review.   Review of patient status, including review of consultants reports, relevant laboratory and other test results, and collaboration with appropriate care team members and the patient's provider was performed as part of comprehensive patient evaluation and provision of chronic care management services.    SDOH (Social Determinants of Health) screening performed today: Financial Strain  Depression   Tobacco Use Stress. See Care Plan for related entries.   Outpatient Encounter Medications as of 02/26/2019  Medication Sig Note  . ARIPiprazole (ABILIFY) 5 MG tablet Take 1 tablet (5 mg total) by mouth daily.   Marland Kitchen buPROPion (WELLBUTRIN XL) 300 MG 24 hr tablet Take 1 tablet (300 mg total) by mouth daily.   . DULoxetine (CYMBALTA) 20 MG capsule TAKE 1 CAPSULE BY MOUTH ONCE DAILY TO BECOMBINED WITH 60MG  CAP   . DULoxetine (CYMBALTA) 60 MG capsule Take 1 capsule (60 mg total) by mouth daily. To be combined 20 mg   . eszopiclone (LUNESTA) 1 MG TABS tablet Take 1 tablet (1 mg total) by mouth at bedtime. Take immediately before bedtime   . albuterol (VENTOLIN HFA) 108 (90 Base) MCG/ACT inhaler Inhale 1-2 puffs into the lungs every 4 (four) hours as needed for wheezing or shortness of breath.   Marland Kitchen azelastine (ASTELIN) 0.1 % nasal spray    . Cholecalciferol (D3 ADULT PO) Take by mouth. 10000 IU weekly   . cyanocobalamin 100 MCG tablet Take 100 mcg by mouth daily.   . diazepam (VALIUM) 5 MG tablet Take 1 tablet (5 mg  total) by mouth at bedtime as needed for muscle spasms or anxiety. (Patient not taking: Reported on 01/22/2019)   . fluticasone (FLONASE) 50 MCG/ACT nasal spray Place 2 sprays into both nostrils daily. Max 2 sprays   . gabapentin (NEURONTIN) 600 MG tablet Take 1,200 mg by mouth 3 (three) times daily.   . hydrochlorothiazide (HYDRODIURIL) 12.5 MG tablet Take 1 tablet (12.5 mg total) by mouth daily. In am   . hydrOXYzine (VISTARIL) 25 MG capsule TAKE 1-2 CAPSULES BY MOUTH AT BEDTIME ASNEEDED FOR SEVERE ANXIETY 01/22/2019: Using a couple times a week  . ipratropium (ATROVENT) 0.06 % nasal spray Place 2 sprays into both nostrils 3 (three) times daily. prn   . letrozole (FEMARA) 2.5 MG tablet Take 1 tablet (2.5 mg total) by mouth daily.   Marland Kitchen loratadine (CLARITIN) 10 MG tablet Take 1 tablet (10 mg total) by mouth daily as needed for allergies. At night (Patient not taking: Reported on 01/22/2019)   . montelukast (SINGULAIR) 10 MG tablet Take 1 tablet (10 mg total) by mouth at bedtime.   . Multiple Vitamins-Minerals (MULTIVITAMIN) tablet Take 1 tablet by mouth daily. (Patient not taking: Reported on 01/22/2019)   . omeprazole (PRILOSEC) 20 MG capsule    . oxymorphone (OPANA) 10 MG tablet Take 10 mg by mouth 2 (two) times daily.    Marland Kitchen senna-docusate (SENOKOT-S) 8.6-50 MG tablet Take 1-2 tablets by mouth daily as needed for mild constipation.   Marland Kitchen telmisartan (MICARDIS) 40 MG tablet Take 1  tablet (40 mg total) by mouth daily. At night   . umeclidinium-vilanterol (ANORO ELLIPTA) 62.5-25 MCG/INH AEPB Inhale 1 puff into the lungs daily.    No facility-administered encounter medications on file as of 02/26/2019.     Objective:   Goals Addressed            This Visit's Progress     Patient Stated   . "I want to be healthy" (pt-stated)       Current Barriers:  . Polypharmacy; complex patient with multiple comorbidities including chronic pain, depression, hx breast cancer, tobacco use disorder, alcohol  abuse, HTN . Financial concerns: prescription costs resolved, approved for Medicare Extra Help. Generic copay $3.60/90 day, brand $8.95/90 day . Notes that she has been "feeling better" lately. Has been purchasing plants, bright colored pillows, and other bright/happy things to have in her home. For the first time in any of our calls, she referred to her late daughter by her name- [name].  . Notes she is extremely interested in pursuing tobacco cessation o Major depression: exacerbated by death of her daughter last year. Follows w/ Dr. Shea Evans, regular therapy w/ LCSW [name]; duloxetine 80 mg daily (20 mg + 60 mg), bupropion XL 300 mg daily, aripiprazole 5 mg daily (currently tapering up aripiprazole while decreasing bupropion), hydroxyzine 25 mg PRN (3-4 times weekly),  o Insomina: Lunesta 1 mg QPM, occasionally takes a hydroxyzine if it's ~1-2 am and she isn't asleep yet. o Chronic pain: Opana 10 mg BID -notes it's prescribed TID per Dr. Luan Pulling, but she will limit herself to BID; gabapentin 1200 mg TID (6 600 mg tabs daily, occasionally will take a 7th tablet) o HTN: telmisartan 80 mg QAM, HCTZ 25 mg QAM, o Hx breast cancer: letrozole 2.5 mg daily o Tobacco abuse/COPD: Anoro daily, albuterol HFA 3-4 times weekly; loratadine 10 mg daily + montelukast 10 mg daily, fluticasone and azelastine nasal sprays daily - Notes previously on Chantix, but it made her "mad as hornet", however, she was in a stressful place in her life and is interested in trying anything that would help - Also reports having previously tried nicotine patches, and had some skin irritation  Pharmacist Clinical Goal(s):  Marland Kitchen Over the next 90 days, patient will work with PharmD and provider towards optimized medication management  Interventions: . Comprehensive medication review performed; medication list updated in electronic medical record . Congratulated patient on continued forward growth w/ her health and mental  state . Extensive discussion of preferred options for tobacco cessation which include Chantix vs dual NRT (patch + gum). American Thor[employer] Society 2020 guidelines preferentially recommend Chantix over NRT for patients w/ hx psychiatric disorders. Chantix would likely be $8.95/90 days. Nicotine replacement therapy is not covered by Medicaid, would need to be purchased OTC.  . Encouraged patient to contact [place] [facility] to enroll in tobacco cessation counseling and support. She should be able to receive starter 2 weeks of nicotine patches + gum/lozenges. Encouraged her to continue with this support program. . Patient has f/u with PCP next week. Encouraged her to discuss desire to quit smoking at that time to determine Dr. McLean-Scocuzza's recommendations  Patient Self Care Activities:  . Patient will take medications as prescribed  Please see past updates related to this goal by clicking on the "Past Updates" button in the selected goal          Plan:  - Scheduled f/u call 03/26/19  [name], PharmD, BCACP, [facility]  [employer] [phone]

## 2019-02-26 NOTE — Patient Instructions (Signed)
Visit Information  Goals Addressed            This Visit's Progress     Patient Stated   . "I want to be healthy" (pt-stated)       Current Barriers:  . Polypharmacy; complex patient with multiple comorbidities including chronic pain, depression, hx breast cancer, tobacco use disorder, alcohol abuse, HTN . Financial concerns: prescription costs resolved, approved for Medicare Extra Help. Generic copay $3.60/90 day, brand $8.95/90 day . Notes that she has been "feeling better" lately. Has been purchasing plants, bright colored pillows, and other bright/happy things to have in her home. For the first time in any of our calls, she referred to her late daughter by her name- [name].  . Notes she is extremely interested in pursuing tobacco cessation o Major depression: exacerbated by death of her daughter last year. Follows w/ Dr. Shea Evans, regular therapy w/ LCSW [name]; duloxetine 80 mg daily (20 mg + 60 mg), bupropion XL 300 mg daily, aripiprazole 5 mg daily (currently tapering up aripiprazole while decreasing bupropion), hydroxyzine 25 mg PRN (3-4 times weekly),  o Insomina: Lunesta 1 mg QPM, occasionally takes a hydroxyzine if it's ~1-2 am and she isn't asleep yet. o Chronic pain: Opana 10 mg BID -notes it's prescribed TID per Dr. Luan Pulling, but she will limit herself to BID; gabapentin 1200 mg TID (6 600 mg tabs daily, occasionally will take a 7th tablet) o HTN: telmisartan 80 mg QAM, HCTZ 25 mg QAM, o Hx breast cancer: letrozole 2.5 mg daily o Tobacco abuse/COPD: Anoro daily, albuterol HFA 3-4 times weekly; loratadine 10 mg daily + montelukast 10 mg daily, fluticasone and azelastine nasal sprays daily - Notes previously on Chantix, but it made her "mad as hornet", however, she was in a stressful place in her life and is interested in trying anything that would help - Also reports having previously tried nicotine patches, and had some skin irritation  Pharmacist Clinical Goal(s):  Marland Kitchen Over  the next 90 days, patient will work with PharmD and provider towards optimized medication management  Interventions: . Comprehensive medication review performed; medication list updated in electronic medical record . Congratulated patient on continued forward growth w/ her health and mental state . Extensive discussion of preferred options for tobacco cessation which include Chantix vs dual NRT (patch + gum). American [employer] Society 2020 guidelines preferentially recommend Chantix over NRT for patients w/ hx psychiatric disorders. Chantix would likely be $8.95/90 days. Nicotine replacement therapy is not covered by Medicaid, would need to be purchased OTC.  . Encouraged patient to contact [place] [facility] to enroll in tobacco cessation counseling and support. She should be able to receive starter 2 weeks of nicotine patches + gum/lozenges. Encouraged her to continue with this support program. . Patient has f/u with PCP next week. Encouraged her to discuss desire to quit smoking at that time to determine Dr. McLean-Scocuzza's recommendations  Patient Self Care Activities:  . Patient will take medications as prescribed  Please see past updates related to this goal by clicking on the "Past Updates" button in the selected goal         The patient verbalized understanding of instructions provided today and declined a print copy of patient instruction materials.   Plan:  - Scheduled f/u call 03/26/19  [name], PharmD, BCACP, CPP Clinical Pharmacist [place] [phone]

## 2019-02-27 ENCOUNTER — Other Ambulatory Visit: Payer: Self-pay | Admitting: Internal Medicine

## 2019-02-27 ENCOUNTER — Ambulatory Visit: Payer: Self-pay | Admitting: *Deleted

## 2019-02-27 ENCOUNTER — Other Ambulatory Visit (INDEPENDENT_AMBULATORY_CARE_PROVIDER_SITE_OTHER): Payer: PPO

## 2019-02-27 ENCOUNTER — Other Ambulatory Visit: Payer: Self-pay

## 2019-02-27 DIAGNOSIS — R7303 Prediabetes: Secondary | ICD-10-CM | POA: Diagnosis not present

## 2019-02-27 DIAGNOSIS — I10 Essential (primary) hypertension: Secondary | ICD-10-CM

## 2019-02-27 DIAGNOSIS — E538 Deficiency of other specified B group vitamins: Secondary | ICD-10-CM

## 2019-02-27 DIAGNOSIS — E559 Vitamin D deficiency, unspecified: Secondary | ICD-10-CM

## 2019-02-27 DIAGNOSIS — Z13220 Encounter for screening for lipoid disorders: Secondary | ICD-10-CM

## 2019-02-27 DIAGNOSIS — Z1329 Encounter for screening for other suspected endocrine disorder: Secondary | ICD-10-CM | POA: Diagnosis not present

## 2019-02-27 DIAGNOSIS — Z1389 Encounter for screening for other disorder: Secondary | ICD-10-CM | POA: Diagnosis not present

## 2019-02-27 DIAGNOSIS — E871 Hypo-osmolality and hyponatremia: Secondary | ICD-10-CM

## 2019-02-27 DIAGNOSIS — R946 Abnormal results of thyroid function studies: Secondary | ICD-10-CM

## 2019-02-27 DIAGNOSIS — R748 Abnormal levels of other serum enzymes: Secondary | ICD-10-CM

## 2019-02-27 LAB — CBC WITH DIFFERENTIAL/PLATELET
Basophils Absolute: 0.1 10*3/uL (ref 0.0–0.1)
Basophils Relative: 1.1 % (ref 0.0–3.0)
Eosinophils Absolute: 0.1 10*3/uL (ref 0.0–0.7)
Eosinophils Relative: 1 % (ref 0.0–5.0)
HCT: 43.8 % (ref 36.0–46.0)
Hemoglobin: 14.4 g/dL (ref 12.0–15.0)
Lymphocytes Relative: 22.4 % (ref 12.0–46.0)
Lymphs Abs: 1.4 10*3/uL (ref 0.7–4.0)
MCHC: 32.9 g/dL (ref 30.0–36.0)
MCV: 97 fl (ref 78.0–100.0)
Monocytes Absolute: 0.7 10*3/uL (ref 0.1–1.0)
Monocytes Relative: 10.3 % (ref 3.0–12.0)
Neutro Abs: 4.1 10*3/uL (ref 1.4–7.7)
Neutrophils Relative %: 65.2 % (ref 43.0–77.0)
Platelets: 281 10*3/uL (ref 150.0–400.0)
RBC: 4.51 Mil/uL (ref 3.87–5.11)
RDW: 12.9 % (ref 11.5–15.5)
WBC: 6.4 10*3/uL (ref 4.0–10.5)

## 2019-02-27 LAB — COMPREHENSIVE METABOLIC PANEL
ALT: 12 U/L (ref 0–35)
AST: 16 U/L (ref 0–37)
Albumin: 4.4 g/dL (ref 3.5–5.2)
Alkaline Phosphatase: 147 U/L — ABNORMAL HIGH (ref 39–117)
BUN: 8 mg/dL (ref 6–23)
CO2: 28 mEq/L (ref 19–32)
Calcium: 10 mg/dL (ref 8.4–10.5)
Chloride: 93 mEq/L — ABNORMAL LOW (ref 96–112)
Creatinine, Ser: 0.55 mg/dL (ref 0.40–1.20)
GFR: 111.74 mL/min (ref >60.00)
Glucose, Bld: 102 mg/dL — ABNORMAL HIGH (ref 70–99)
Potassium: 3.6 mEq/L (ref 3.5–5.1)
Sodium: 133 mEq/L — ABNORMAL LOW (ref 135–145)
Total Bilirubin: 0.6 mg/dL (ref 0.2–1.2)
Total Protein: 8 g/dL (ref 6.0–8.3)

## 2019-02-27 LAB — URINALYSIS, ROUTINE W REFLEX MICROSCOPIC
Bilirubin Urine: NEGATIVE
Glucose, UA: NEGATIVE
Hgb urine dipstick: NEGATIVE
Ketones, ur: NEGATIVE
Leukocytes,Ua: NEGATIVE
Nitrite: NEGATIVE
Protein, ur: NEGATIVE
Specific Gravity, Urine: 1.007 (ref 1.001–1.03)
pH: 6 (ref 5.0–8.0)

## 2019-02-27 LAB — VITAMIN D 25 HYDROXY (VIT D DEFICIENCY, FRACTURES): VITD: 31.35 ng/mL (ref 30.00–100.00)

## 2019-02-27 LAB — VITAMIN B12: Vitamin B-12: 569 pg/mL (ref 211–911)

## 2019-02-27 LAB — LIPID PANEL
Cholesterol: 219 mg/dL — ABNORMAL HIGH (ref 0–200)
HDL: 133.8 mg/dL (ref >39.00)
LDL Cholesterol: 74 mg/dL (ref 0–99)
NonHDL: 84.99
Total CHOL/HDL Ratio: 2
Triglycerides: 54 mg/dL (ref 0.0–149.0)
VLDL: 10.8 mg/dL (ref 0.0–40.0)

## 2019-02-27 LAB — TSH: TSH: 7.36 u[IU]/mL — ABNORMAL HIGH (ref 0.35–4.50)

## 2019-02-27 LAB — HEMOGLOBIN A1C: Hgb A1c MFr Bld: 5.1 % (ref 4.6–6.5)

## 2019-03-04 ENCOUNTER — Ambulatory Visit (INDEPENDENT_AMBULATORY_CARE_PROVIDER_SITE_OTHER): Payer: PPO | Admitting: Licensed Clinical Social Worker

## 2019-03-04 ENCOUNTER — Encounter: Payer: Self-pay | Admitting: Licensed Clinical Social Worker

## 2019-03-04 ENCOUNTER — Other Ambulatory Visit: Payer: Self-pay

## 2019-03-04 DIAGNOSIS — F331 Major depressive disorder, recurrent, moderate: Secondary | ICD-10-CM | POA: Diagnosis not present

## 2019-03-04 NOTE — Progress Notes (Signed)
Virtual Visit via Video Note  I connected with Ashley Cross on 03/04/19 at 12:30 PM EST by a video enabled telemedicine application and verified that I am speaking with the correct person using two identifiers.   I discussed the limitations of evaluation and management by telemedicine and the availability of in person appointments. The patient expressed understanding and agreed to proceed.  I discussed the assessment and treatment plan with the patient. The patient was provided an opportunity to ask questions and all were answered. The patient agreed with the plan and demonstrated an understanding of the instructions.   The patient was advised to call back or seek an in-person evaluation if the symptoms worsen or if the condition fails to improve as anticipated.  I provided 60 minutes of non-face-to-face time during this encounter.   [name], LCSW    THERAPIST PROGRESS NOTE  Session Time: 1230 Participation Level: Active  Behavioral Response: NeatAlertAnxious  Type of Therapy: Individual Therapy  Treatment Goals addressed: Anxiety  Interventions: Supportive  Summary: Ashley Cross is a 64 y.o. female who presents with continued symptoms related to her diagnosis. Ashley Cross reports doing well since our last session. She reports feeling a change in her mood recently after she stopped trying to rename her daughter's room and bathroom. "I don't know why but it just made me feel free." LCSW validated these feelings and recognized that giving yourself permission to feel something or to do something, we can give ourselves freedom at the same time. Ashley Cross expressed understanding and agreement with this idea. LCSW held space for Ashley Cross to discuss her feelings around the subject and validated associated feelings. Ashley Cross went on to discuss buying a lot of things for her house recently, which she stated she knows she cannot afford but also feels excited by her purchases. LCSW  validated Ashley Cross's feelings and encouraged her to play these situations out in her mind in order to ensure she avoids feeling dread and anxiety around her purchases. We discussed ways to monitor feelings of guilt or shame associated with her purchasing things for the home. We discussed other ways to obtain a similar feeling, and ways she could complete DIY projects around the home in order to decrease the need to spend money.   Suicidal/Homicidal: No  Therapist Response: Ashley Cross continues to work towards her tx goals but has not yet reached them. We will continue to work on processing grief and improving distress tolerance moving forward.  Plan: Return again in 2 weeks.  Diagnosis: Axis I: MDD    Axis II: No diagnosis    [name], LCSW 03/04/2019

## 2019-03-05 ENCOUNTER — Encounter: Payer: PPO | Admitting: Internal Medicine

## 2019-03-18 ENCOUNTER — Other Ambulatory Visit: Payer: Self-pay | Admitting: Psychiatry

## 2019-03-18 DIAGNOSIS — F331 Major depressive disorder, recurrent, moderate: Secondary | ICD-10-CM

## 2019-03-19 ENCOUNTER — Ambulatory Visit (INDEPENDENT_AMBULATORY_CARE_PROVIDER_SITE_OTHER): Payer: PPO | Admitting: Psychiatry

## 2019-03-19 ENCOUNTER — Encounter: Payer: Self-pay | Admitting: Psychiatry

## 2019-03-19 ENCOUNTER — Other Ambulatory Visit: Payer: Self-pay

## 2019-03-19 DIAGNOSIS — F17200 Nicotine dependence, unspecified, uncomplicated: Secondary | ICD-10-CM

## 2019-03-19 DIAGNOSIS — F1010 Alcohol abuse, uncomplicated: Secondary | ICD-10-CM | POA: Diagnosis not present

## 2019-03-19 DIAGNOSIS — F331 Major depressive disorder, recurrent, moderate: Secondary | ICD-10-CM | POA: Diagnosis not present

## 2019-03-19 MED ORDER — ARIPIPRAZOLE 5 MG PO TABS: 7.5000 mg | ORAL_TABLET | Freq: Every day | ORAL | 1 refills | Status: DC

## 2019-03-19 MED ORDER — DULOXETINE HCL 20 MG PO CPEP: ORAL_CAPSULE | ORAL | 1 refills | Status: DC

## 2019-03-19 MED ORDER — ESZOPICLONE 1 MG PO TABS: 1.0000 mg | ORAL_TABLET | Freq: Every day | ORAL | 1 refills | Status: DC

## 2019-03-19 MED ORDER — DULOXETINE HCL 60 MG PO CPEP: 60.0000 mg | ORAL_CAPSULE | Freq: Every day | ORAL | 1 refills | Status: DC

## 2019-03-19 NOTE — Progress Notes (Signed)
Provider Location : [facility] Patient Location : Home  Virtual Visit via Video Note  I connected with Ashley Cross on 03/19/19 at  2:20 PM EST by a video enabled telemedicine application and verified that I am speaking with the correct person using two identifiers.   I discussed the limitations of evaluation and management by telemedicine and the availability of in person appointments. The patient expressed understanding and agreed to proceed.  I discussed the assessment and treatment plan with the patient. The patient was provided an opportunity to ask questions and all were answered. The patient agreed with the plan and demonstrated an understanding of the instructions.   The patient was advised to call back or seek an in-person evaluation if the symptoms worsen or if the condition fails to improve as anticipated. [facility] MD OP Progress Note  03/19/2019 5:12 PM Ashley Cross  MRN:  [id number]  Chief Complaint:  Chief Complaint    Follow-up     HPI: Etola is a 64 year old Caucasian female on disability, lives in [place], has a history of MDD alcohol use disorder, history of breast cancer, chronic pain, hypertension was evaluated by telemedicine today.  Patient today reports she is currently making progress with regards to her mood symptoms.  She is tolerating the Abilify well.  She feels more motivated and less depressed.  She however is interested in dosage increase since she is tolerating it well.  Patient reports sleep is good since she has been more compliant with the Lunesta.  Denies any suicidality, homicidality or perceptual disturbances.  Patient reports she is cutting back on alcohol and currently drinks 1-2 drinks of wine per night and has been trying to limit use as much as she can.  She continues to follow-up with therapist and reports therapy sessions is going well.  She reports she has been able to move forward and be more accepting of the death of her  daughter who passed away a year and a half ago.  Patient reports she continues to have good support system and her dogs are therapeutic for her.  She reports she is trying to cut back on smoking and will contact 1 800 quit line.  She denies any other concerns today. Visit Diagnosis:    ICD-10-CM   1. MDD (major depressive disorder), recurrent episode, moderate (HCC)  F33.1 ARIPiprazole (ABILIFY) 5 MG tablet    DULoxetine (CYMBALTA) 20 MG capsule    DULoxetine (CYMBALTA) 60 MG capsule    eszopiclone (LUNESTA) 1 MG TABS tablet  2. Alcohol use disorder, mild, abuse  F10.10   3. Tobacco use disorder  F17.200     Past Psychiatric History: Reviewed past psychiatric history from my progress note on 09/16/2018.  Past trials of Zoloft, Wellbutrin, Lexapro, Celexa  Past Medical History:  Past Medical History:  Diagnosis Date  . Anxiety   . Breast cancer ([place])    Left breast(nipple mass) 2018  . Cervical stenosis of spine   . Chicken pox   . Chronic back pain   . Colon polyps   . COPD (chronic obstructive pulmonary disease) ([place])   . Cyst of left nipple   . Depression   . Hypertension   . Insomnia   . Personal history of radiation therapy     Past Surgical History:  Procedure Laterality Date  . BACK SURGERY     x 3 (2010 x 2; 2013 x 1)   . BREAST BIOPSY Left    core- neg  . BREAST  LUMPECTOMY Left May 25, 2016  . BREAST SURGERY Left    breast biopsy  . CERVICAL CONE BIOPSY    . CERVICAL FUSION     times 2  . COLONOSCOPY WITH PROPOFOL N/A 04/18/2015   Procedure: COLONOSCOPY WITH PROPOFOL;  Surgeon: [name], MD;  Location: [facility];  Service: Endoscopy;  Laterality: N/A;  . ELBOW SURGERY     05/25/2012 b/l elbows per pt ulnar nerve   . ELBOW SURGERY     x2   . EPIDURAL BLOCK INJECTION     Dr. Maryjean Ka   . MASS EXCISION Left 04/12/2016   Procedure: EXCISION OF LEFT NIPPLE MASS;  Surgeon: Stark Klein, MD;  Location: [facility];  Service: General;  Laterality:  Left;  . NECK SURGERY    . POSTERIOR CERVICAL FUSION/FORAMINOTOMY  03/07/2011   Procedure: POSTERIOR CERVICAL FUSION/FORAMINOTOMY LEVEL 4;  Surgeon: Eustace Moore, MD;  Location: [facility];  Service: Neurosurgery;  Laterality: N/A;  cervical three to thoracic one posterior cervical fusion   . SENTINEL NODE BIOPSY Left 05/29/2016   Procedure: LEFT SENTINEL LYMPH NODE BIOPSY;  Surgeon: Stark Klein, MD;  Location: [facility];  Service: General;  Laterality: Left;  . TONSILLECTOMY      Family Psychiatric History: I have reviewed family psychiatric history from my progress note on 09/16/2018.  Family History:  Family History  Problem Relation Age of Onset  . Breast cancer Mother 11  . Hypertension Mother   . Cancer Mother        breast dx'ed 21   . Cancer Father        prostate  . Hypertension Father   . Drug abuse Daughter   . Hypertension Maternal Grandmother   . Breast cancer Cousin     Social History: Reviewed social history from my progress note on 09/16/2018. Social History   Socioeconomic History  . Marital status: Divorced    Spouse name: Not on file  . Number of children: 2  . Years of education: Not on file  . Highest education level: Not on file  Occupational History  . Not on file  Tobacco Use  . Smoking status: Current Every Day Smoker    Packs/day: 0.50    Years: 30.00    Pack years: 15.00    Types: Cigarettes  . Smokeless tobacco: Never Used  . Tobacco comment: cutting back - half pask reported 01/13/2019  Substance and Sexual Activity  . Alcohol use: Yes    Comment: social  . Drug use: No    Comment: on opana since jan 2018  . Sexual activity: Not on file  Other Topics Concern  . Not on file  Social History Narrative   As of 01/25/17 not sexually active of in relationship    Divorced   2 kids son and daughter (though daughter died of suicide in May 25, 2017)   Son lives in [place] now as of 12/2018       Disability    Loves animals has 3 dogs            Social Determinants of Radio broadcast assistant Strain:   . Difficulty of Paying Living Expenses: Not on file  Food Insecurity:   . Worried About Charity fundraiser in the Last Year: Not on file  . Ran Out of Food in the Last Year: Not on file  Transportation Needs:   . Lack of Transportation (Medical): Not on file  . Lack of Transportation (  Non-Medical): Not on file  Physical Activity:   . Days of Exercise per Week: Not on file  . Minutes of Exercise per Session: Not on file  Stress:   . Feeling of Stress : Not on file  Social Connections:   . Frequency of Communication with Friends and Family: Not on file  . Frequency of Social Gatherings with Friends and Family: Not on file  . Attends Religious Services: Not on file  . Active Member of Clubs or Organizations: Not on file  . Attends Archivist Meetings: Not on file  . Marital Status: Not on file    Allergies:  Allergies  Allergen Reactions  . Sulfa Antibiotics Itching and Rash  . Sulfonamide Derivatives Itching and Rash    Metabolic Disorder Labs: Lab Results  Component Value Date   HGBA1C 5.1 02/27/2019   No results found for: PROLACTIN Lab Results  Component Value Date   CHOL 219 (H) 02/27/2019   TRIG 54.0 02/27/2019   HDL 133.80 02/27/2019   CHOLHDL 2 02/27/2019   VLDL 10.8 02/27/2019   LDLCALC 74 02/27/2019   LDLCALC 81 02/13/2018   Lab Results  Component Value Date   TSH 7.36 (H) 02/27/2019   TSH 2.97 02/13/2018    Therapeutic Level Labs: No results found for: LITHIUM No results found for: VALPROATE No components found for:  CBMZ  Current Medications: Current Outpatient Medications  Medication Sig Dispense Refill  . albuterol (VENTOLIN HFA) 108 (90 Base) MCG/ACT inhaler Inhale 1-2 puffs into the lungs every 4 (four) hours as needed for wheezing or shortness of breath. 18 g 12  . ARIPiprazole (ABILIFY) 5 MG tablet Take 1.5 tablets (7.5 mg total) by mouth daily. 45 tablet 1  .  azelastine (ASTELIN) 0.1 % nasal spray     . buPROPion (WELLBUTRIN XL) 300 MG 24 hr tablet Take 1 tablet (300 mg total) by mouth daily. 90 tablet 3  . Cholecalciferol (D3 ADULT PO) Take by mouth. 10000 IU weekly    . cyanocobalamin 100 MCG tablet Take 100 mcg by mouth daily.    . diazepam (VALIUM) 5 MG tablet Take 1 tablet (5 mg total) by mouth at bedtime as needed for muscle spasms or anxiety. (Patient not taking: Reported on 01/22/2019) 30 tablet 5  . DULoxetine (CYMBALTA) 20 MG capsule TAKE 1 CAPSULE BY MOUTH ONCE DAILY TO BECOMBINED WITH 60MG  CAP 90 capsule 1  . DULoxetine (CYMBALTA) 60 MG capsule Take 1 capsule (60 mg total) by mouth daily. To be combined 20 mg 90 capsule 1  . eszopiclone (LUNESTA) 1 MG TABS tablet Take 1 tablet (1 mg total) by mouth at bedtime. Take immediately before bedtime 30 tablet 1  . fluticasone (FLONASE) 50 MCG/ACT nasal spray Place 2 sprays into both nostrils daily. Max 2 sprays 16 g 11  . gabapentin (NEURONTIN) 600 MG tablet Take 1,200 mg by mouth 3 (three) times daily.    . hydrochlorothiazide (HYDRODIURIL) 12.5 MG tablet Take 1 tablet (12.5 mg total) by mouth daily. In am 90 tablet 3  . hydrOXYzine (VISTARIL) 25 MG capsule TAKE 1-2 CAPSULES BY MOUTH AT BEDTIME ASNEEDED FOR SEVERE ANXIETY 60 capsule 1  . ipratropium (ATROVENT) 0.06 % nasal spray Place 2 sprays into both nostrils 3 (three) times daily. prn 15 mL 12  . letrozole (FEMARA) 2.5 MG tablet Take 1 tablet (2.5 mg total) by mouth daily. 90 tablet 3  . loratadine (CLARITIN) 10 MG tablet Take 1 tablet (10 mg total) by mouth daily  as needed for allergies. At night (Patient not taking: Reported on 01/22/2019) 90 tablet 3  . montelukast (SINGULAIR) 10 MG tablet Take 1 tablet (10 mg total) by mouth at bedtime. 90 tablet 3  . Multiple Vitamins-Minerals (MULTIVITAMIN) tablet Take 1 tablet by mouth daily. (Patient not taking: Reported on 01/22/2019) 90 tablet 3  . omeprazole (PRILOSEC) 20 MG capsule     . oxymorphone  (OPANA) 10 MG tablet Take 10 mg by mouth 2 (two) times daily.     Marland Kitchen senna-docusate (SENOKOT-S) 8.6-50 MG tablet Take 1-2 tablets by mouth daily as needed for mild constipation. 180 tablet 3  . telmisartan (MICARDIS) 40 MG tablet Take 1 tablet (40 mg total) by mouth daily. At night 90 tablet 3  . umeclidinium-vilanterol (ANORO ELLIPTA) 62.5-25 MCG/INH AEPB Inhale 1 puff into the lungs daily. 60 each 12   No current facility-administered medications for this visit.     Musculoskeletal: Strength & Muscle Tone: UTA Gait & Station: normal Patient leans: N/A  Psychiatric Specialty Exam: Review of Systems  Psychiatric/Behavioral: Positive for dysphoric mood. The patient is nervous/anxious.   All other systems reviewed and are negative.   There were no vitals taken for this visit.There is no height or weight on file to calculate BMI.  General Appearance: Casual  Eye Contact:  Fair  Speech:  Clear and Coherent  Volume:  Normal  Mood:  Anxious and Dysphoric  Affect:  Congruent  Thought Process:  Goal Directed and Descriptions of Associations: Intact  Orientation:  Full (Time, Place, and Person)  Thought Content: Logical   Suicidal Thoughts:  No  Homicidal Thoughts:  No  Memory:  Immediate;   Fair Recent;   Fair Remote;   Fair  Judgement:  Fair  Insight:  Fair  Psychomotor Activity:  Normal  Concentration:  Concentration: Fair and Attention Span: Fair  Recall:  [employer] of Knowledge: Fair  Language: Fair  Akathisia:  No  Handed:  Right  AIMS (if indicated): UTA  Assets:  Communication Skills Desire for Improvement Housing Social Support  ADL's:  Intact  Cognition: WNL  Sleep:  Fair   Screenings: GAD-7     Office Visit from 09/30/2017 in [facility]  Total GAD-7 Score  18    PHQ2-9     Office Visit from 12/31/2018 in [facility] from 09/16/2018 in [facility] Visit from 09/30/2017 in  [facility] Office Visit from 08/19/2017 in [facility] from 06/13/2016 in [facility]  PHQ-2 Total Score  0  0  4  4  1   PHQ-9 Total Score  --  --  18  16  --       Assessment and Plan: Masiah is a 64 year old Caucasian female, divorced, disabled, has a history of depression, chronic pain, hypertension, history of breast cancer was evaluated by telemedicine today.  She is biologically predisposed given her family history as well as substance abuse problems.  She also has psychosocial stressors of her health issues, financial problems, death of her daughter a year and a half ago.  Patient is currently making progress with regards to her mood symptoms and will continue to benefit from medication readjustment and psychotherapy sessions.  Plan as noted below.  Plan MDD-improving Cymbalta 80 mg p.o. daily Wellbutrin at reduced dosage of 300 mg p.o. daily Increase Abilify to 7.5 mg p.o. daily Lunesta 1 mg p.o.  nightly for sleep Hydroxyzine 25 mg p.o. daily as needed for anxiety as well as sleep as needed Continue CBT with Ms. [name]  Alcohol use disorder mild-improving Provided substance abuse counseling.  She is currently cutting back on limiting use.  Tobacco use disorder-improving She agrees to contact 1 800 quit line.  Pending labs-TSH, lipid panel, hemoglobin A1c.  Pending EKG to monitor QTC.  Follow-up in clinic in 1 month or sooner if needed.  March 16 at 2:20 PM  I have spent atleast 20 minutes non face to face with patient today. More than 50 % of the time was spent for  ordering medications and test ,psychoeducation and supportive psychotherapy and care coordination,as well as documenting clinical information in electronic health record. This note was generated in part or whole with voice recognition software. Voice recognition is usually quite accurate but there are transcription errors  that can and very often do occur. I apologize for any typographical errors that were not detected and corrected.       Ursula Alert, MD 03/19/2019, 5:12 PM

## 2019-03-24 ENCOUNTER — Encounter: Payer: Self-pay | Admitting: Licensed Clinical Social Worker

## 2019-03-24 ENCOUNTER — Ambulatory Visit (INDEPENDENT_AMBULATORY_CARE_PROVIDER_SITE_OTHER): Payer: PPO | Admitting: Licensed Clinical Social Worker

## 2019-03-24 ENCOUNTER — Other Ambulatory Visit: Payer: Self-pay

## 2019-03-24 DIAGNOSIS — F331 Major depressive disorder, recurrent, moderate: Secondary | ICD-10-CM

## 2019-03-24 NOTE — Progress Notes (Signed)
Virtual Visit via Video Note  I connected with Ashley Cross on 03/24/19 at 11:00 AM EST by a video enabled telemedicine application and verified that I am speaking with the correct person using two identifiers.   I discussed the limitations of evaluation and management by telemedicine and the availability of in person appointments. The patient expressed understanding and agreed to proceed.  I discussed the assessment and treatment plan with the patient. The patient was provided an opportunity to ask questions and all were answered. The patient agreed with the plan and demonstrated an understanding of the instructions.   The patient was advised to call back or seek an in-person evaluation if the symptoms worsen or if the condition fails to improve as anticipated.  I provided 60 minutes of non-face-to-face time during this encounter.   [name], LCSW    THERAPIST PROGRESS NOTE  Session Time: 1100  Participation Level: Active  Behavioral Response: NeatAlertAnxious  Type of Therapy: Individual Therapy  Treatment Goals addressed: Coping  Interventions: Supportive  Summary: Ashley Cross is a 64 y.o. female who presents with continued symptoms related to her diagnosis. Ashley Cross reports doing well since our last session, and noted she is feeling less depressed than she has in more recent months. Ashley Cross reports she has gotten several things that she wanted to get off of her to-do list. For example, Ashley Cross reported she asked her brother to borrow money in order to get her dishwasher installed properly by a professional. Ashley Cross reported it was not a big deal to ask her brother for money, as he has money to spare and they have been close for most of her life. LCSW validated Ashley Cross feelings and encouraged her to utilize this as evidence that she can depend on others in times of need. Ashley Cross expressed agreement with this information and noted she felt happier knowing could  depend on someone and that allowed her to feel less isolated. LCSW held space for Ashley Cross to discuss her feelings on asking others for help, and offered insight where appropriate. LCSW suggested Ashley Cross continue her herb gardening and working on her house in order to facilitate more structure in her life. We discussed the ways these distractions/activities are able to offer her a make-shift schedule in order to provide more direction in her life (as previously mentioned she wanted to have). Ashley Cross expressed understanding and agreement with all information presented.   Suicidal/Homicidal: No  Therapist Response: Ashley Cross continues to work towards her tx goals but has not yet reached them. We will continue to work on improving distress tolerance and emotional regulation skills moving forward.   Plan: Return again in 2 weeks.  Diagnosis: Axis I: MDD    Axis II: No diagnosis    [name], LCSW 03/24/2019

## 2019-03-25 ENCOUNTER — Other Ambulatory Visit: Payer: Self-pay | Admitting: Psychiatry

## 2019-03-25 DIAGNOSIS — F331 Major depressive disorder, recurrent, moderate: Secondary | ICD-10-CM

## 2019-03-26 ENCOUNTER — Ambulatory Visit (INDEPENDENT_AMBULATORY_CARE_PROVIDER_SITE_OTHER): Payer: PPO | Admitting: Pharmacist

## 2019-03-26 DIAGNOSIS — J449 Chronic obstructive pulmonary disease, unspecified: Secondary | ICD-10-CM

## 2019-03-26 DIAGNOSIS — Z720 Tobacco use: Secondary | ICD-10-CM

## 2019-03-26 NOTE — Patient Instructions (Signed)
Visit Information  Goals Addressed            This Visit's Progress     Patient Stated   . "I want to be healthy" (pt-stated)       CARE PLAN ENTRY (see longtitudinal plan of care for additional care plan information)  Current Barriers:  . Polypharmacy; complex patient with multiple comorbidities including chronic pain, depression, hx breast cancer, tobacco use disorder, alcohol abuse, HTN o Financial concerns resolved approved for Medicare Extra Help. Generic copay ~$4/90 day, brand ~$9/90 day . Continues to feel good on aripriprazole therapy. Dose increased at last visit. Feels she is more empowered to tackle financial problems right now. 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  . Still interested in tobacco cessation. Plans to discuss w/ PCP at next visit o Major depression: exacerbated by recent death of her daughter, Ashley Cross. Follows w/ Dr. Shea Evans, regular therapy w/ LCSW Ashley Cross; duloxetine 80 mg daily (20 mg + 60 mg), bupropion XL 300 mg daily, aripiprazole 7.5 mg daily, hydroxyzine 25 mg PRN (3-4 times weekly), confirms recent dose increases o [place]: Lunesta 1 mg QPM, PRN hydroxyzine if having a hard time falling asleep; notes she has been sleeping regularly and waking up at regular times o Chronic pain: Opana 10 mg BID; gabapentin 1200 mg TID (6 600 mg tabs daily, occasionally will take a 7th tablet) o HTN: telmisartan 80 mg QAM, HCTZ 25 mg QAM, o Hx breast cancer: letrozole 2.5 mg daily o Tobacco abuse/COPD: Anoro daily, albuterol HFA 3-4 times weekly; loratadine 10 mg daily + montelukast 10 mg daily, fluticasone and azelastine nasal sprays daily - Interested in NRT therapy  - Tobacco use: Trying to keep to 1/2 ppd. Notes that if she stays busy inside, she is less likely to go outside and smoke  - Notes she called the [employer], but wasn't given an option to talk to a live person.   Pharmacist Clinical Goal(s):  Marland Kitchen Over the next 90 days, patient will work with  PharmD and provider towards optimized medication management  Interventions: . Congratulated patient on continued improvement in mood with aripiprazole dose increase. Encouraged continued appointments with psych + CBT . Praised patient for commitment to settling finances. She notes that she has an appointment w/ [facility] at end of March. She is interested in trying to have a sooner appointment; I encouraged her to call that office and try to reschedule.  . Praised for committing to reducing tobacco consumption. Encouraged to talk to PCP about this at upcoming appointment. Provided [place] Quit Line and [facility] Tobacco Cessation line phone numbers  Patient Self Care Activities:  . Patient will take medications as prescribed  Please see past updates related to this goal by clicking on the "Past Updates" button in the selected goal         Patient verbalizes understanding of instructions provided today.      Plan:  - Scheduled f/u call 05/14/19  [name], PharmD, BCACP, CPP Clinical Pharmacist [place] [phone]

## 2019-03-26 NOTE — Chronic Care Management (AMB) (Signed)
Chronic Care Management   Follow Up Note   03/26/2019 Name: Ashley Cross MRN: [id number] DOB: 06-17-1955  Referred by: McLean-Scocuzza, Nino Glow, MD Reason for referral : Chronic Care Management (Medication Management)   Silah Kerchner is a 64 y.o. year old female who is a primary care patient of McLean-Scocuzza, Nino Glow, MD. The CCM team was consulted for assistance with chronic disease management and care coordination needs.    Contacted patient for medication management review.   Review of patient status, including review of consultants reports, relevant laboratory and other test results, and collaboration with appropriate care team members and the patient's provider was performed as part of comprehensive patient evaluation and provision of chronic care management services.    SDOH (Social Determinants of Health) assessments performed: Yes SDOH Interventions     Most Recent Value  SDOH Interventions  SDOH Interventions for the Following Domains  Tobacco, Financial Strain  Financial Strain Interventions  Other (Comment) [Applied for LIS]  Tobacco Interventions  Cessation Materials Given and Reviewed, Referral to Provider       Outpatient Encounter Medications as of 03/26/2019  Medication Sig Note  . ARIPiprazole (ABILIFY) 5 MG tablet Take 1.5 tablets (7.5 mg total) by mouth daily.   Marland Kitchen buPROPion (WELLBUTRIN XL) 300 MG 24 hr tablet Take 1 tablet (300 mg total) by mouth daily.   . DULoxetine (CYMBALTA) 20 MG capsule TAKE 1 CAPSULE BY MOUTH ONCE DAILY TO BECOMBINED WITH 60MG  CAP   . DULoxetine (CYMBALTA) 60 MG capsule Take 1 capsule (60 mg total) by mouth daily. To be combined 20 mg   . eszopiclone (LUNESTA) 1 MG TABS tablet Take 1 tablet (1 mg total) by mouth at bedtime. Take immediately before bedtime   . gabapentin (NEURONTIN) 600 MG tablet Take 1,200 mg by mouth 3 (three) times daily.   . hydrochlorothiazide (HYDRODIURIL) 12.5 MG tablet Take 1 tablet (12.5 mg total) by  mouth daily. In am   . hydrOXYzine (VISTARIL) 25 MG capsule TAKE 1-2 CAPSULES BY MOUTH AT BEDTIME ASNEEDED FOR SEVERE ANXIETY 01/22/2019: Using a couple times a week  . montelukast (SINGULAIR) 10 MG tablet Take 1 tablet (10 mg total) by mouth at bedtime.   Marland Kitchen albuterol (VENTOLIN HFA) 108 (90 Base) MCG/ACT inhaler Inhale 1-2 puffs into the lungs every 4 (four) hours as needed for wheezing or shortness of breath.   Marland Kitchen azelastine (ASTELIN) 0.1 % nasal spray    . Cholecalciferol (D3 ADULT PO) Take by mouth. 10000 IU weekly   . cyanocobalamin 100 MCG tablet Take 100 mcg by mouth daily.   . diazepam (VALIUM) 5 MG tablet Take 1 tablet (5 mg total) by mouth at bedtime as needed for muscle spasms or anxiety. (Patient not taking: Reported on 01/22/2019)   . fluticasone (FLONASE) 50 MCG/ACT nasal spray Place 2 sprays into both nostrils daily. Max 2 sprays   . ipratropium (ATROVENT) 0.06 % nasal spray Place 2 sprays into both nostrils 3 (three) times daily. prn   . letrozole (FEMARA) 2.5 MG tablet Take 1 tablet (2.5 mg total) by mouth daily.   Marland Kitchen loratadine (CLARITIN) 10 MG tablet Take 1 tablet (10 mg total) by mouth daily as needed for allergies. At night (Patient not taking: Reported on 01/22/2019)   . Multiple Vitamins-Minerals (MULTIVITAMIN) tablet Take 1 tablet by mouth daily. (Patient not taking: Reported on 01/22/2019)   . omeprazole (PRILOSEC) 20 MG capsule    . oxymorphone (OPANA) 10 MG tablet Take 10 mg by mouth  2 (two) times daily.    Marland Kitchen senna-docusate (SENOKOT-S) 8.6-50 MG tablet Take 1-2 tablets by mouth daily as needed for mild constipation.   Marland Kitchen telmisartan (MICARDIS) 40 MG tablet Take 1 tablet (40 mg total) by mouth daily. At night   . umeclidinium-vilanterol (ANORO ELLIPTA) 62.5-25 MCG/INH AEPB Inhale 1 puff into the lungs daily.    No facility-administered encounter medications on file as of 03/26/2019.     Objective:   Goals Addressed            This Visit's Progress     Patient  Stated   . "I want to be healthy" (pt-stated)       CARE PLAN ENTRY (see longtitudinal plan of care for additional care plan information)  Current Barriers:  . Polypharmacy; complex patient with multiple comorbidities including chronic pain, depression, hx breast cancer, tobacco use disorder, alcohol abuse, HTN o Financial concerns resolved approved for Medicare Extra Help. Generic copay ~$4/90 day, brand ~$9/90 day . Continues to feel good on aripriprazole therapy. Dose increased at last visit. Feels she is more empowered to tackle financial problems right now. 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  . Still interested in tobacco cessation. Plans to discuss w/ PCP at next visit o Major depression: exacerbated by recent death of her daughter, [name]. Follows w/ Dr. Shea Evans, regular therapy w/ LCSW [name]; duloxetine 80 mg daily (20 mg + 60 mg), bupropion XL 300 mg daily, aripiprazole 7.5 mg daily, hydroxyzine 25 mg PRN (3-4 times weekly), confirms recent dose increases o Insomina: Lunesta 1 mg QPM, PRN hydroxyzine if having a hard time falling asleep; notes she has been sleeping regularly and waking up at regular times o Chronic pain: Opana 10 mg BID; gabapentin 1200 mg TID (6 600 mg tabs daily, occasionally will take a 7th tablet) o HTN: telmisartan 80 mg QAM, HCTZ 25 mg QAM, o Hx breast cancer: letrozole 2.5 mg daily o Tobacco abuse/COPD: Anoro daily, albuterol HFA 3-4 times weekly; loratadine 10 mg daily + montelukast 10 mg daily, fluticasone and azelastine nasal sprays daily - Interested in NRT therapy  - Tobacco use: Trying to keep to 1/2 ppd. Notes that if she stays busy inside, she is less likely to go outside and smoke  - Notes she called the [employer], but wasn't given an option to talk to a live person.   Pharmacist Clinical Goal(s):  Marland Kitchen Over the next 90 days, patient will work with PharmD and provider towards optimized medication  management  Interventions: . Congratulated patient on continued improvement in mood with aripiprazole dose increase. Encouraged continued appointments with psych + CBT . Praised patient for commitment to settling finances. She notes that she has an appointment w/ [facility] at end of March. She is interested in trying to have a sooner appointment; I encouraged her to call that office and try to reschedule.  . Praised for committing to reducing tobacco consumption. Encouraged to talk to PCP about this at upcoming appointment. Provided [place] Quit Line and [employer] Tobacco Cessation line phone numbers  Patient Self Care Activities:  . Patient will take medications as prescribed  Please see past updates related to this goal by clicking on the "Past Updates" button in the selected goal          Plan:  - Scheduled f/u call 05/14/19  [name], PharmD, BCACP, [place] Pharmacist [place] [phone]

## 2019-03-27 ENCOUNTER — Other Ambulatory Visit: Payer: Self-pay | Admitting: Internal Medicine

## 2019-03-27 ENCOUNTER — Ambulatory Visit: Payer: Self-pay | Admitting: Pharmacist

## 2019-03-27 ENCOUNTER — Telehealth: Payer: Self-pay | Admitting: Pharmacist

## 2019-03-27 DIAGNOSIS — J449 Chronic obstructive pulmonary disease, unspecified: Secondary | ICD-10-CM

## 2019-03-27 MED ORDER — ANORO ELLIPTA 62.5-25 MCG/INH IN AEPB: 1.0000 | INHALATION_SPRAY | Freq: Every day | RESPIRATORY_TRACT | 12 refills | Status: DC

## 2019-03-27 NOTE — Chronic Care Management (AMB) (Signed)
Chronic Care Management   Follow Up Note   03/27/2019 Name: Teaghen Hedstrom MRN: [id number] DOB: 1955-04-30  Referred by: McLean-Scocuzza, Nino Glow, MD Reason for referral : Chronic Care Management (Medication Management)   Carita Troy is a 64 y.o. year old female who is a primary care patient of McLean-Scocuzza, Nino Glow, MD. The CCM team was consulted for assistance with chronic disease management and care coordination needs.    Received call from patient today with medication access questions.  Review of patient status, including review of consultants reports, relevant laboratory and other test results, and collaboration with appropriate care team members and the patient's provider was performed as part of comprehensive patient evaluation and provision of chronic care management services.    SDOH (Social Determinants of Health) assessments performed: Yes SDOH Interventions     Most Recent Value  SDOH Interventions  SDOH Interventions for the Following Domains  Financial Strain  Financial Strain Interventions  -- [facility] Extra Help counseling]       Outpatient Encounter Medications as of 03/27/2019  Medication Sig Note  . albuterol (VENTOLIN HFA) 108 (90 Base) MCG/ACT inhaler Inhale 1-2 puffs into the lungs every 4 (four) hours as needed for wheezing or shortness of breath.   . ARIPiprazole (ABILIFY) 5 MG tablet Take 1.5 tablets (7.5 mg total) by mouth daily.   Marland Kitchen azelastine (ASTELIN) 0.1 % nasal spray    . buPROPion (WELLBUTRIN XL) 300 MG 24 hr tablet Take 1 tablet (300 mg total) by mouth daily.   . Cholecalciferol (D3 ADULT PO) Take by mouth. 10000 IU weekly   . cyanocobalamin 100 MCG tablet Take 100 mcg by mouth daily.   . diazepam (VALIUM) 5 MG tablet Take 1 tablet (5 mg total) by mouth at bedtime as needed for muscle spasms or anxiety. (Patient not taking: Reported on 01/22/2019)   . DULoxetine (CYMBALTA) 20 MG capsule TAKE 1 CAPSULE BY MOUTH ONCE DAILY TO  BECOMBINED WITH 60MG  CAP   . DULoxetine (CYMBALTA) 60 MG capsule Take 1 capsule (60 mg total) by mouth daily. To be combined 20 mg   . eszopiclone (LUNESTA) 1 MG TABS tablet Take 1 tablet (1 mg total) by mouth at bedtime. Take immediately before bedtime   . fluticasone (FLONASE) 50 MCG/ACT nasal spray Place 2 sprays into both nostrils daily. Max 2 sprays   . gabapentin (NEURONTIN) 600 MG tablet Take 1,200 mg by mouth 3 (three) times daily.   . hydrochlorothiazide (HYDRODIURIL) 12.5 MG tablet Take 1 tablet (12.5 mg total) by mouth daily. In am   . hydrOXYzine (VISTARIL) 25 MG capsule TAKE 1-2 CAPSULES BY MOUTH AT BEDTIME ASNEEDED FOR SEVERE ANXIETY 01/22/2019: Using a couple times a week  . ipratropium (ATROVENT) 0.06 % nasal spray Place 2 sprays into both nostrils 3 (three) times daily. prn   . letrozole (FEMARA) 2.5 MG tablet Take 1 tablet (2.5 mg total) by mouth daily.   Marland Kitchen loratadine (CLARITIN) 10 MG tablet Take 1 tablet (10 mg total) by mouth daily as needed for allergies. At night (Patient not taking: Reported on 01/22/2019)   . montelukast (SINGULAIR) 10 MG tablet Take 1 tablet (10 mg total) by mouth at bedtime.   . Multiple Vitamins-Minerals (MULTIVITAMIN) tablet Take 1 tablet by mouth daily. (Patient not taking: Reported on 01/22/2019)   . omeprazole (PRILOSEC) 20 MG capsule    . oxymorphone (OPANA) 10 MG tablet Take 10 mg by mouth 2 (two) times daily.    Marland Kitchen senna-docusate (SENOKOT-S) 8.6-50  MG tablet Take 1-2 tablets by mouth daily as needed for mild constipation.   Marland Kitchen telmisartan (MICARDIS) 40 MG tablet Take 1 tablet (40 mg total) by mouth daily. At night   . umeclidinium-vilanterol (ANORO ELLIPTA) 62.5-25 MCG/INH AEPB Inhale 1 puff into the lungs daily.    No facility-administered encounter medications on file as of 03/27/2019.     Objective:   Goals Addressed            This Visit's Progress     Patient Stated   . "I want to be healthy" (pt-stated)       CARE PLAN ENTRY (see  longtitudinal plan of care for additional care plan information)  Current Barriers:  . Polypharmacy; complex patient with multiple comorbidities including chronic pain, depression, hx breast cancer, tobacco use disorder, alcohol abuse, HTN o Financial concerns resolved approved for Medicare Extra Help. Generic copay ~$4/90 day, brand ~$9/90 day o Called me today noting that she tried to call [name] for Anoro refill, but she was informed she would have to spend $600 first . Continues to feel good on aripriprazole therapy. Dose increased at last visit. Feels she is more empowered to tackle financial problems right now. 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  . Still interested in tobacco cessation. Plans to discuss w/ PCP at next visit o Major depression: exacerbated by recent death of her daughter, [name]. Follows w/ Dr. Shea Evans, regular therapy w/ LCSW [name]; duloxetine 80 mg daily (20 mg + 60 mg), bupropion XL 300 mg daily, aripiprazole 7.5 mg daily, hydroxyzine 25 mg PRN (3-4 times weekly), confirms recent dose increases o Insomina: Lunesta 1 mg QPM, PRN hydroxyzine if having a hard time falling asleep; notes she has been sleeping regularly and waking up at regular times o Chronic pain: Opana 10 mg BID; gabapentin 1200 mg TID (6 600 mg tabs daily, occasionally will take a 7th tablet) o HTN: telmisartan 80 mg QAM, HCTZ 25 mg QAM, o Hx breast cancer: letrozole 2.5 mg daily o Tobacco abuse/COPD: Anoro daily, albuterol HFA 3-4 times weekly; loratadine 10 mg daily + montelukast 10 mg daily, fluticasone and azelastine nasal sprays daily - Interested in NRT therapy  - Tobacco use: 1/2 ppd  Pharmacist Clinical Goal(s):  Marland Kitchen Over the next 90 days, patient will work with PharmD and provider towards optimized medication management  Interventions: . Reiterated approval for Medicare Extra Help. As she now has Extra Help, she is not eligible to reapply for Anoro/Ventolin assistance through [place].  However, she can now afford the Anoro at [facility], as her copay will only be ~$9 for 3 inhalers. Last prescription was printed. Requested refill on [facility] sent to [facility] by PCP   Patient Self Care Activities:  . Patient will take medications as prescribed  Please see past updates related to this goal by clicking on the "Past Updates" button in the selected goal          Plan:  - Will outreach patient as previously scheduled  [name], PharmD, [place], [place] Pharmacist [place] [phone]

## 2019-03-27 NOTE — Patient Instructions (Signed)
Visit Information  Goals Addressed            This Visit's Progress     Patient Stated   . "I want to be healthy" (pt-stated)       CARE PLAN ENTRY (see longtitudinal plan of care for additional care plan information)  Current Barriers:  . Polypharmacy; complex patient with multiple comorbidities including chronic pain, depression, hx breast cancer, tobacco use disorder, alcohol abuse, HTN o Financial concerns resolved approved for Medicare Extra Help. Generic copay ~$4/90 day, brand ~$9/90 day o Called me today noting that she tried to call [facility] for Anoro refill, but she was informed she would have to spend $600 first . Continues to feel good on aripriprazole therapy. Dose increased at last visit. Feels she is more empowered to tackle financial problems right now. 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  . Still interested in tobacco cessation. Plans to discuss w/ PCP at next visit o Major depression: exacerbated by recent death of her daughter, [name]. Follows w/ Dr. Shea Evans, regular therapy w/ LCSW [name]; duloxetine 80 mg daily (20 mg + 60 mg), bupropion XL 300 mg daily, aripiprazole 7.5 mg daily, hydroxyzine 25 mg PRN (3-4 times weekly), confirms recent dose increases o Insomina: Lunesta 1 mg QPM, PRN hydroxyzine if having a hard time falling asleep; notes she has been sleeping regularly and waking up at regular times o Chronic pain: Opana 10 mg BID; gabapentin 1200 mg TID (6 600 mg tabs daily, occasionally will take a 7th tablet) o HTN: telmisartan 80 mg QAM, HCTZ 25 mg QAM, o Hx breast cancer: letrozole 2.5 mg daily o Tobacco abuse/COPD: Anoro daily, albuterol HFA 3-4 times weekly; loratadine 10 mg daily + montelukast 10 mg daily, fluticasone and azelastine nasal sprays daily - Interested in NRT therapy  - Tobacco use: 1/2 ppd  Pharmacist Clinical Goal(s):  Marland Kitchen Over the next 90 days, patient will work with PharmD and provider towards optimized medication  management  Interventions: . Reiterated approval for Medicare Extra Help. As she now has Extra Help, she is not eligible to reapply for Anoro/Ventolin assistance through [place]. However, she can now afford the Anoro at [place], as her copay will only be ~$9 for 3 inhalers. Last prescription was printed. Requested refill on [facility] to [facility] by PCP   Patient Self Care Activities:  . Patient will take medications as prescribed  Please see past updates related to this goal by clicking on the "Past Updates" button in the selected goal         Patient verbalizes understanding of instructions provided today.   Plan:  - Will outreach patient as previously scheduled  [name], PharmD, [place], [place] Pharmacist [place] [phone]

## 2019-03-27 NOTE — Telephone Encounter (Signed)
Patient requests refill on Anoro to [facility]. Last prescription was set to print. She was previously receiving from patient assistance program, but can now afford at the pharmacy due to having Medicare Extra Help

## 2019-04-03 ENCOUNTER — Other Ambulatory Visit (INDEPENDENT_AMBULATORY_CARE_PROVIDER_SITE_OTHER): Payer: PPO

## 2019-04-03 ENCOUNTER — Other Ambulatory Visit: Payer: Self-pay

## 2019-04-03 DIAGNOSIS — R946 Abnormal results of thyroid function studies: Secondary | ICD-10-CM

## 2019-04-03 DIAGNOSIS — E871 Hypo-osmolality and hyponatremia: Secondary | ICD-10-CM | POA: Diagnosis not present

## 2019-04-03 DIAGNOSIS — R748 Abnormal levels of other serum enzymes: Secondary | ICD-10-CM | POA: Diagnosis not present

## 2019-04-03 LAB — COMPREHENSIVE METABOLIC PANEL
ALT: 13 U/L (ref 0–35)
AST: 18 U/L (ref 0–37)
Albumin: 4.1 g/dL (ref 3.5–5.2)
Alkaline Phosphatase: 115 U/L (ref 39–117)
BUN: 6 mg/dL (ref 6–23)
CO2: 31 mEq/L (ref 19–32)
Calcium: 10.1 mg/dL (ref 8.4–10.5)
Chloride: 94 mEq/L — ABNORMAL LOW (ref 96–112)
Creatinine, Ser: 0.5 mg/dL (ref 0.40–1.20)
GFR: 124.69 mL/min (ref >60.00)
Glucose, Bld: 110 mg/dL — ABNORMAL HIGH (ref 70–99)
Potassium: 4.2 mEq/L (ref 3.5–5.1)
Sodium: 133 mEq/L — ABNORMAL LOW (ref 135–145)
Total Bilirubin: 0.5 mg/dL (ref 0.2–1.2)
Total Protein: 7.4 g/dL (ref 6.0–8.3)

## 2019-04-03 LAB — T3, FREE: T3, Free: 3.4 pg/mL (ref 2.3–4.2)

## 2019-04-03 LAB — TSH: TSH: 2.91 u[IU]/mL (ref 0.35–4.50)

## 2019-04-03 LAB — GAMMA GT: GGT: 34 U/L (ref 7–51)

## 2019-04-03 LAB — T4, FREE: Free T4: 0.88 ng/dL (ref 0.60–1.60)

## 2019-04-06 LAB — THYROID PEROXIDASE ANTIBODY: Thyroperoxidase Ab SerPl-aCnc: <1 IU/mL (ref <9)

## 2019-04-07 ENCOUNTER — Encounter: Payer: Self-pay | Admitting: Licensed Clinical Social Worker

## 2019-04-07 ENCOUNTER — Other Ambulatory Visit: Payer: Self-pay

## 2019-04-07 ENCOUNTER — Telehealth: Payer: Self-pay | Admitting: Internal Medicine

## 2019-04-07 ENCOUNTER — Ambulatory Visit (INDEPENDENT_AMBULATORY_CARE_PROVIDER_SITE_OTHER): Payer: PPO | Admitting: Licensed Clinical Social Worker

## 2019-04-07 DIAGNOSIS — F331 Major depressive disorder, recurrent, moderate: Secondary | ICD-10-CM

## 2019-04-07 NOTE — Telephone Encounter (Signed)
-----   Message from [name], MD sent at 04/06/2019  2:33 PM EST ----- Liver enzymes normal  Sodium slightly low likely due to BP med hctz will monitor  -what has her BP been running?  -if not BP cuff rec order one for upper arm from [facility] Thyroid labs now normal all of them   Ashley Cross

## 2019-04-07 NOTE — Progress Notes (Signed)
Virtual Visit via Video Note  I connected with Ashley Cross on 04/07/19 at 11:00 AM EST by a video enabled telemedicine application and verified that I am speaking with the correct person using two identifiers.   I discussed the limitations of evaluation and management by telemedicine and the availability of in person appointments. The patient expressed understanding and agreed to proceed.   I discussed the assessment and treatment plan with the patient. The patient was provided an opportunity to ask questions and all were answered. The patient agreed with the plan and demonstrated an understanding of the instructions.   The patient was advised to call back or seek an in-person evaluation if the symptoms worsen or if the condition fails to improve as anticipated.  I provided 60 minutes of non-face-to-face time during this encounter.   [name], LCSW   THERAPIST PROGRESS NOTE  Session Time: 1100  Participation Level: Active  Behavioral Response: NeatAlertAnxious  Type of Therapy: Individual Therapy  Treatment Goals addressed: Coping  Interventions: Supportive  Summary: Ashley Cross is a 64 y.o. female who presents with continued symptoms related to her diagnosis. Ashley Cross reports doing wells since our last session. She reports she has had moments of sadness and lack of motivation, but noted she has been doing well overall. She reports having a really good conversation with her son about life in general, which made her feel really supported. LCSW validated Ashley Cross's feelings and encouraged her to set up a weekly chat with her son moving forward. Ashley Cross went on to discuss her lack of motivation to go walking. She reports one of the primary things keeping her from walking regularly, she feels, is that she worries people will think she didn't do enough to save her daughter and they'll blame her. LCSW validated Ashley Cross's feelings and encouraged her to challenge that  thought, and come up with evidence for/agaisnt her thought, and attempt to replace it with a more constructive idea. Ashley Cross was able to do this and expressed understanding of how she could utilize this moving forward. LCSW held space for Ashley Cross to discuss her feelings around the situation, and offered insight where appropriate.   Suicidal/Homicidal: No  Therapist Response: Ashley Cross continues to work towards her tx goals but has not yet reached them. We will continue to work on improving emotional regulation skills and distress tolerance moving forward.   Plan: Return again in 2 weeks.  Diagnosis: Axis I: MDD    Axis II: No diagnosis    [name], LCSW 04/07/2019

## 2019-04-07 NOTE — Telephone Encounter (Signed)
Patient informed and verbalized understanding.  Patient states she has a cuff but has not been checking her BP in over a month.  Informed patient to start taking her BP 2 hours after her meds are taken. Patient will do so and then call back Tuesday 3/09 in order to update [place] on her levels.

## 2019-04-14 DIAGNOSIS — M961 Postlaminectomy syndrome, not elsewhere classified: Secondary | ICD-10-CM | POA: Diagnosis not present

## 2019-04-14 DIAGNOSIS — G5621 Lesion of ulnar nerve, right upper limb: Secondary | ICD-10-CM | POA: Insufficient documentation

## 2019-04-15 NOTE — Telephone Encounter (Signed)
Patient BP readings:  3/3 11 am 146/79 p:91       11:15 am 14/76  3/8 11:00 am 136/84 p:91       12:00 pm 149/84  Patient has not taken her BP anymore than this. Patient states she would like help to stop smoking. States the patches in the past gave her a rash.   Please advise

## 2019-04-16 ENCOUNTER — Other Ambulatory Visit: Payer: Self-pay

## 2019-04-16 ENCOUNTER — Ambulatory Visit (INDEPENDENT_AMBULATORY_CARE_PROVIDER_SITE_OTHER): Payer: PPO | Admitting: Licensed Clinical Social Worker

## 2019-04-16 ENCOUNTER — Telehealth: Payer: Self-pay

## 2019-04-16 ENCOUNTER — Encounter: Payer: Self-pay | Admitting: Licensed Clinical Social Worker

## 2019-04-16 DIAGNOSIS — F331 Major depressive disorder, recurrent, moderate: Secondary | ICD-10-CM

## 2019-04-16 DIAGNOSIS — M961 Postlaminectomy syndrome, not elsewhere classified: Secondary | ICD-10-CM | POA: Diagnosis not present

## 2019-04-16 MED ORDER — ARIPIPRAZOLE 10 MG PO TABS: 10.0000 mg | ORAL_TABLET | Freq: Every day | ORAL | 0 refills | Status: DC

## 2019-04-16 MED ORDER — HYDROXYZINE PAMOATE 25 MG PO CAPS: 25.0000 mg | ORAL_CAPSULE | Freq: Three times a day (TID) | ORAL | 1 refills | Status: DC | PRN

## 2019-04-16 NOTE — Progress Notes (Signed)
Virtual Visit via Video Note  I connected with Ashley Cross on 04/16/19 at 11:00 AM EST by a video enabled telemedicine application and verified that I am speaking with the correct person using two identifiers.   I discussed the limitations of evaluation and management by telemedicine and the availability of in person appointments. The patient expressed understanding and agreed to proceed.  I discussed the assessment and treatment plan with the patient. The patient was provided an opportunity to ask questions and all were answered. The patient agreed with the plan and demonstrated an understanding of the instructions.   The patient was advised to call back or seek an in-person evaluation if the symptoms worsen or if the condition fails to improve as anticipated.  I provided 53 minutes of non-face-to-face time during this encounter.   [name], LCSW    THERAPIST PROGRESS NOTE  Session Time: 1100  Participation Level: Active  Behavioral Response: NeatAlertAnxious  Type of Therapy: Individual Therapy  Treatment Goals addressed: Coping  Interventions: Supportive  Summary: Ashley Cross is a 64 y.o. female who presents with continued symptoms related to her diagnosis. Clarrissa reports doing well since our last session, but noted some increased anxiety. Angely reports her primary stressor at the moment is her friend, Ashley Cross, who has been helping her around the house. She reports he will often help with things, manual labor, but also will do things on her property that Ashley Cross would prefer him not to do. LCSW validated Ashley Cross's feelings and held space while she vented her frustrations around the situation. She reported Ashley Cross will often judge her when she spends money and has told her she is irresponsible. LCSW validated Ashley Cross's feelings around the situation, and encouraged her to have an upfront conversation with her friend where she sets boundaries. We discussed that if  his help is contingent on him feeling he has the right to tell her what to do/how to spend her money, then perhaps Ashley Cross not helping out would be the best situation. We discussed ways to utilize assertive communication to ensure her needs are being met and that she is being heard. Ashley Cross expressed understanding and agreement with all information presented.   Suicidal/Homicidal: No  Therapist Response: Ashley Cross continues to work towards her tx goals but has not yet reached them. We will continue to work on improving emotional regulation and distress tolerance skills moving forward.   Plan: Return again in 1 weeks.  Diagnosis: Axis I: MDD    Axis II: No diagnosis    [name], LCSW 04/16/2019

## 2019-04-16 NOTE — Telephone Encounter (Signed)
Patient called and stated that she's trying to quit smoking and cut down on her drinking. She also stated that she's taking her anxiety meds during the day as well as taking them at night. She stated that her anxiety levels are "off the charts." Please review and advise. Thank you.

## 2019-04-16 NOTE — Telephone Encounter (Signed)
Returned call to patient, she reports she had a spinal injection for pain today and hence feels kind of down however she believes she will be better tomorrow.  She reports she is more anxious since she is trying to cut back on smoking.  She is wondering whether she can take the hydroxyzine even during the day.  She has been taking Abilify 10 mg since she is not able to cut the 5 mg tablets into half to take a 7.5.  She denies any side effects to that.  She wants to stay on this dosage if possible.  Will change hydroxyzine to 25 mg 3 times a day as needed for anxiety.  Will increase Abilify to 10 mg p.o. daily.

## 2019-04-17 ENCOUNTER — Telehealth: Payer: Self-pay | Admitting: Internal Medicine

## 2019-04-17 ENCOUNTER — Other Ambulatory Visit: Payer: Self-pay | Admitting: Internal Medicine

## 2019-04-17 DIAGNOSIS — R0982 Postnasal drip: Secondary | ICD-10-CM

## 2019-04-17 MED ORDER — IPRATROPIUM BROMIDE 0.06 % NA SOLN: 2.0000 | Freq: Three times a day (TID) | NASAL | 12 refills | Status: DC

## 2019-04-17 NOTE — Telephone Encounter (Signed)
Left message for patient to call back and schedule Medicare Annual Wellness Visit (AWV) either virtually or audio only.  No hx of AWV; please schedule at anytime with Denisa O'Brien-Blaney at [facility]  *offered on vm Tues 3/16 before she sees pcp on 3/17

## 2019-04-20 ENCOUNTER — Other Ambulatory Visit: Payer: Self-pay | Admitting: Internal Medicine

## 2019-04-20 DIAGNOSIS — I10 Essential (primary) hypertension: Secondary | ICD-10-CM

## 2019-04-20 MED ORDER — TELMISARTAN 80 MG PO TABS: 80.0000 mg | ORAL_TABLET | Freq: Every day | ORAL | 3 refills | Status: DC

## 2019-04-20 NOTE — Telephone Encounter (Signed)
Increase telmisartan 40 mg to 80 mg if she has any 40 mg can take 2 pills, new pill will be 1 80 mg pill daily telmisartan/micardis  With   Hctz 12.5 mg 1 pill daily   Goal blood pressure <130/<80 if her bp is not this let me know after change in medication  Does she want to try chantix ? Side effects worsening depression/suicide    She is already on wellbutrin  Other options would be nicotine gum otc   What would she like?   [place]

## 2019-04-21 ENCOUNTER — Encounter: Payer: Self-pay | Admitting: Psychiatry

## 2019-04-21 ENCOUNTER — Other Ambulatory Visit: Payer: Self-pay

## 2019-04-21 ENCOUNTER — Ambulatory Visit (INDEPENDENT_AMBULATORY_CARE_PROVIDER_SITE_OTHER): Payer: PPO | Admitting: Psychiatry

## 2019-04-21 DIAGNOSIS — F331 Major depressive disorder, recurrent, moderate: Secondary | ICD-10-CM

## 2019-04-21 DIAGNOSIS — F17200 Nicotine dependence, unspecified, uncomplicated: Secondary | ICD-10-CM | POA: Diagnosis not present

## 2019-04-21 DIAGNOSIS — F1010 Alcohol abuse, uncomplicated: Secondary | ICD-10-CM

## 2019-04-21 MED ORDER — DULOXETINE HCL 30 MG PO CPEP: 90.0000 mg | ORAL_CAPSULE | Freq: Every day | ORAL | 0 refills | Status: DC

## 2019-04-21 MED ORDER — ESZOPICLONE 1 MG PO TABS: 1.0000 mg | ORAL_TABLET | Freq: Every day | ORAL | 1 refills | Status: DC

## 2019-04-21 NOTE — Progress Notes (Signed)
Provider Location : [facility] Patient Location : Home  Virtual Visit via Video Note  I connected with Ashley Cross on 04/21/19 at  2:20 PM EDT by a video enabled telemedicine application and verified that I am speaking with the correct person using two identifiers.   I discussed the limitations of evaluation and management by telemedicine and the availability of in person appointments. The patient expressed understanding and agreed to proceed.   I discussed the assessment and treatment plan with the patient. The patient was provided an opportunity to ask questions and all were answered. The patient agreed with the plan and demonstrated an understanding of the instructions.   The patient was advised to call back or seek an in-person evaluation if the symptoms worsen or if the condition fails to improve as anticipated.  [place] MD OP Progress Note  04/21/2019 5:39 PM [name]  MRN:  [id number]  Chief Complaint:  Chief Complaint    Follow-up     HPI: Ashley Cross is a 64 year old Caucasian female on disability, lives in [place], has a history of MDD, alcohol use disorder, history of breast cancer, chronic pain, hypertension was evaluated by telemedicine today.  Patient today reports she has been noncompliant on her Cymbalta.  She reports she forgot to pick up the prescription from the pharmacy and currently takes Cymbalta 20 mg daily and Strattera 80 mg.  She has not been taking her 60 mg as prescribed.  She does report some anxiety and depressive symptoms since the past few days.  She now wonders whether not taking the right dosage may have something to do with it.  She also reports she has been grieving and thinking about her daughter a lot more today than the past few days.  She became very tearful when she discussed her daughter who passed away.  Patient reports sleep is good.  She reports she has been drinking 1-2 drinks of wine per night and is currently cutting  back.  She reports she has not been using the hydroxyzine much and would like to use it more and maybe cut back on the drinking.  Patient denies any suicidality, homicidality or perceptual disturbances.  Patient reports she has been following up with her therapist Ms. [name].  Visit Diagnosis:    ICD-10-CM   1. MDD (major depressive disorder), recurrent episode, moderate (HCC)  F33.1 eszopiclone (LUNESTA) 1 MG TABS tablet    DULoxetine (CYMBALTA) 30 MG capsule  2. Alcohol use disorder, mild, abuse  F10.10   3. Tobacco use disorder  F17.200     Past Psychiatric History: I have reviewed past psychiatric history from my progress note on 09/16/2018.  Past trials of Zoloft, Wellbutrin, Lexapro, Celexa  Past Medical History:  Past Medical History:  Diagnosis Date  . Anxiety   . Breast cancer ([place])    Left breast(nipple mass) 2018  . Cervical stenosis of spine   . Chicken pox   . Chronic back pain   . Colon polyps   . COPD (chronic obstructive pulmonary disease) ([place])   . Cyst of left nipple   . Depression   . Hypertension   . Insomnia   . Personal history of radiation therapy     Past Surgical History:  Procedure Laterality Date  . BACK SURGERY     x 3 (2010 x 2; 2013 x 1)   . BREAST BIOPSY Left    core- neg  . BREAST LUMPECTOMY Left 2018  . BREAST SURGERY Left  breast biopsy  . CERVICAL CONE BIOPSY    . CERVICAL FUSION     times 2  . COLONOSCOPY WITH PROPOFOL N/A 04/18/2015   Procedure: COLONOSCOPY WITH PROPOFOL;  Surgeon: [name], MD;  Location: [facility]COPY;  Service: Endoscopy;  Laterality: N/A;  . ELBOW SURGERY     2012-05-11 b/l elbows per pt ulnar nerve   . ELBOW SURGERY     x2   . EPIDURAL BLOCK INJECTION     Dr. Maryjean Ka   . MASS EXCISION Left 04/12/2016   Procedure: EXCISION OF LEFT NIPPLE MASS;  Surgeon: Stark Klein, MD;  Location: [place];  Service: General;  Laterality: Left;  . NECK SURGERY    . POSTERIOR CERVICAL  FUSION/FORAMINOTOMY  03/07/2011   Procedure: POSTERIOR CERVICAL FUSION/FORAMINOTOMY LEVEL 4;  Surgeon: Eustace Moore, MD;  Location: [place] [facility];  Service: Neurosurgery;  Laterality: N/A;  cervical three to thoracic one posterior cervical fusion   . SENTINEL NODE BIOPSY Left 05/29/2016   Procedure: LEFT SENTINEL LYMPH NODE BIOPSY;  Surgeon: Stark Klein, MD;  Location: [place];  Service: General;  Laterality: Left;  . TONSILLECTOMY      Family Psychiatric History: I have reviewed family psychiatric history from my progress note on 09/16/2018.  Family History:  Family History  Problem Relation Age of Onset  . Breast cancer Mother 65  . Hypertension Mother   . Cancer Mother        breast dx'ed 38   . Cancer Father        prostate  . Hypertension Father   . Drug abuse Daughter   . Hypertension Maternal Grandmother   . Breast cancer Cousin     Social History: I have reviewed social history from my progress note on 09/16/2018. Social History   Socioeconomic History  . Marital status: Divorced    Spouse name: Not on file  . Number of children: 2  . Years of education: Not on file  . Highest education level: Not on file  Occupational History  . Not on file  Tobacco Use  . Smoking status: Current Every Day Smoker    Packs/day: 0.50    Years: 30.00    Pack years: 15.00    Types: Cigarettes  . Smokeless tobacco: Never Used  . Tobacco comment: cutting back - half pask reported 01/13/2019  Substance and Sexual Activity  . Alcohol use: Yes    Comment: social  . Drug use: No    Comment: on opana since jan 2018  . Sexual activity: Not on file  Other Topics Concern  . Not on file  Social History Narrative   As of 01/25/17 not sexually active of in relationship    Divorced   2 kids son and daughter (though daughter died of suicide in 11-May-2017)   Son lives in [place] now as of 12/2018       Disability    Loves animals has 3 dogs           Social Determinants of Adult nurse Strain: High Risk  . Difficulty of Paying Living Expenses: Hard  Food Insecurity:   . Worried About Charity fundraiser in the Last Year:   . Arboriculturist in the Last Year:   Transportation Needs:   . Film/video editor (Medical):   Marland Kitchen Lack of Transportation (Non-Medical):   Physical Activity:   . Days of Exercise per Week:   .  Minutes of Exercise per Session:   Stress:   . Feeling of Stress :   Social Connections:   . Frequency of Communication with Friends and Family:   . Frequency of Social Gatherings with Friends and Family:   . Attends Religious Services:   . Active Member of Clubs or Organizations:   . Attends Archivist Meetings:   Marland Kitchen Marital Status:     Allergies:  Allergies  Allergen Reactions  . Sulfa Antibiotics Itching and Rash  . Sulfonamide Derivatives Itching and Rash    Metabolic Disorder Labs: Lab Results  Component Value Date   HGBA1C 5.1 02/27/2019   No results found for: PROLACTIN Lab Results  Component Value Date   CHOL 219 (H) 02/27/2019   TRIG 54.0 02/27/2019   HDL 133.80 02/27/2019   CHOLHDL 2 02/27/2019   VLDL 10.8 02/27/2019   LDLCALC 74 02/27/2019   LDLCALC 81 02/13/2018   Lab Results  Component Value Date   TSH 2.91 04/03/2019   TSH 7.36 (H) 02/27/2019    Therapeutic Level Labs: No results found for: LITHIUM No results found for: VALPROATE No components found for:  CBMZ  Current Medications: Current Outpatient Medications  Medication Sig Dispense Refill  . albuterol (VENTOLIN HFA) 108 (90 Base) MCG/ACT inhaler Inhale 1-2 puffs into the lungs every 4 (four) hours as needed for wheezing or shortness of breath. 18 g 12  . ARIPiprazole (ABILIFY) 10 MG tablet Take 1 tablet (10 mg total) by mouth daily. 90 tablet 0  . azelastine (ASTELIN) 0.1 % nasal spray     . buPROPion (WELLBUTRIN XL) 300 MG 24 hr tablet Take 1 tablet (300 mg total) by mouth daily. 90 tablet 3  . Cholecalciferol (D3 ADULT PO)  Take by mouth. 10000 IU weekly    . cyanocobalamin 100 MCG tablet Take 100 mcg by mouth daily.    . diazepam (VALIUM) 5 MG tablet Take 1 tablet (5 mg total) by mouth at bedtime as needed for muscle spasms or anxiety. (Patient not taking: Reported on 01/22/2019) 30 tablet 5  . DULoxetine (CYMBALTA) 30 MG capsule Take 3 capsules (90 mg total) by mouth daily. 270 capsule 0  . [START ON 05/15/2019] eszopiclone (LUNESTA) 1 MG TABS tablet Take 1 tablet (1 mg total) by mouth at bedtime. Take immediately before bedtime 30 tablet 1  . fluticasone (FLONASE) 50 MCG/ACT nasal spray Place 2 sprays into both nostrils daily. Max 2 sprays 16 g 11  . gabapentin (NEURONTIN) 600 MG tablet Take 1,200 mg by mouth 3 (three) times daily.    . hydrochlorothiazide (HYDRODIURIL) 12.5 MG tablet Take 1 tablet (12.5 mg total) by mouth daily. In am 90 tablet 3  . hydrOXYzine (VISTARIL) 25 MG capsule Take 1 capsule (25 mg total) by mouth every 8 (eight) hours as needed for anxiety. 90 capsule 1  . ipratropium (ATROVENT) 0.06 % nasal spray Place 2 sprays into both nostrils 3 (three) times daily. prn 15 mL 12  . letrozole (FEMARA) 2.5 MG tablet Take 1 tablet (2.5 mg total) by mouth daily. 90 tablet 3  . loratadine (CLARITIN) 10 MG tablet Take 1 tablet (10 mg total) by mouth daily as needed for allergies. At night (Patient not taking: Reported on 01/22/2019) 90 tablet 3  . montelukast (SINGULAIR) 10 MG tablet Take 1 tablet (10 mg total) by mouth at bedtime. 90 tablet 3  . Multiple Vitamins-Minerals (MULTIVITAMIN) tablet Take 1 tablet by mouth daily. (Patient not taking: Reported on 01/22/2019) 90 tablet  3  . omeprazole (PRILOSEC) 20 MG capsule     . oxymorphone (OPANA) 10 MG tablet Take 10 mg by mouth 2 (two) times daily.     Marland Kitchen senna-docusate (SENOKOT-S) 8.6-50 MG tablet Take 1-2 tablets by mouth daily as needed for mild constipation. 180 tablet 3  . telmisartan (MICARDIS) 80 MG tablet Take 1 tablet (80 mg total) by mouth daily. At  night 90 tablet 3  . umeclidinium-vilanterol (ANORO ELLIPTA) 62.5-25 MCG/INH AEPB Inhale 1 puff into the lungs daily. 60 each 12   No current facility-administered medications for this visit.     Musculoskeletal: Strength & Muscle Tone: UTA Gait & Station: normal Patient leans: N/A  Psychiatric Specialty Exam: Review of Systems  Psychiatric/Behavioral: Positive for dysphoric mood. The patient is nervous/anxious.   All other systems reviewed and are negative.   There were no vitals taken for this visit.There is no height or weight on file to calculate BMI.  General Appearance: Casual  Eye Contact:  Fair  Speech:  Clear and Coherent  Volume:  Normal  Mood:  Anxious and Dysphoric  Affect:  Congruent  Thought Process:  Goal Directed and Descriptions of Associations: Intact  Orientation:  Full (Time, Place, and Person)  Thought Content: Logical   Suicidal Thoughts:  No  Homicidal Thoughts:  No  Memory:  Immediate;   Fair Recent;   Fair Remote;   Fair  Judgement:  Fair  Insight:  Fair  Psychomotor Activity:  Normal  Concentration:  Concentration: Fair and Attention Span: Fair  Recall:  [employer] of Knowledge: Fair  Language: Fair  Akathisia:  No  Handed:  Right  AIMS (if indicated): UTA  Assets:  Communication Skills Desire for Improvement Housing Social Support  ADL's:  Intact  Cognition: WNL  Sleep:  Fair   Screenings: GAD-7     Office Visit from 09/30/2017 in [facility]  Total GAD-7 Score  18    PHQ2-9     Office Visit from 12/31/2018 in [facility] from 09/16/2018 in [facility] Visit from 09/30/2017 in [facility] Office Visit from 08/19/2017 in [facility] from 06/13/2016 in [facility]  PHQ-2 Total Score  0  0  4  4  1   PHQ-9 Total Score  --  --  18  16  --       Assessment and Plan:  Wannie is a 64 year old Caucasian female, divorced, disabled, has a history of depression, chronic pain, hypertension, history of breast cancer was evaluated by telemedicine today.  She is biologically predisposed given her family history as well as substance abuse problems.  She also has psychosocial stressors of her health issues, financial problems, death of her daughter a year and a half ago.  Patient continues to struggle with mood symptoms and will benefit from the following medication readjustment.  Plan as noted below.  Plan MDD-some progress Increase Cymbalta to 90 mg p.o. daily.  Patient has been noncompliant and encouraged her to take the right dosage. Wellbutrin at reduced dosage of 300 mg p.o. daily Abilify 10 mg p.o. daily Lunesta 1 mg p.o. nightly for sleep Hydroxyzine 25 mg p.o. daily as needed for anxiety symptoms as well as sleep as needed Continue CBT with Ms. [name]  Alcohol use disorder mild-improving Patient currently drinks 2 glasses of wine per night.  Advised patient to limit use to 1 glass  per night.  She is currently cutting back.  Provided counseling.  Tobacco use disorder-improving Provided information for [place] quit now program.  Pending EKG to monitor QTC.  Follow-up in clinic in 4 weeks or sooner if needed.  I have spent atleast 20 minutes non face to face with patient today. More than 50 % of the time was spent for  ordering medications and test ,psychoeducation and supportive psychotherapy and care coordination,as well as documenting clinical information in electronic health record. This note was generated in part or whole with voice recognition software. Voice recognition is usually quite accurate but there are transcription errors that can and very often do occur. I apologize for any typographical errors that were not detected and corrected.       Ursula Alert, MD 04/21/2019, 5:39 PM

## 2019-04-21 NOTE — Telephone Encounter (Signed)
Left message to return call 

## 2019-04-22 ENCOUNTER — Encounter: Payer: PPO | Admitting: Internal Medicine

## 2019-04-23 ENCOUNTER — Ambulatory Visit (INDEPENDENT_AMBULATORY_CARE_PROVIDER_SITE_OTHER): Payer: PPO | Admitting: Licensed Clinical Social Worker

## 2019-04-23 ENCOUNTER — Encounter: Payer: Self-pay | Admitting: Licensed Clinical Social Worker

## 2019-04-23 ENCOUNTER — Other Ambulatory Visit: Payer: Self-pay

## 2019-04-23 DIAGNOSIS — F331 Major depressive disorder, recurrent, moderate: Secondary | ICD-10-CM | POA: Diagnosis not present

## 2019-04-23 NOTE — Progress Notes (Signed)
Virtual Visit via Video Note  I connected with Ashley Cross on 04/23/19 at  1:30 PM EDT by a video enabled telemedicine application and verified that I am speaking with the correct person using two identifiers.   I discussed the limitations of evaluation and management by telemedicine and the availability of in person appointments. The patient expressed understanding and agreed to proceed.    I discussed the assessment and treatment plan with the patient. The patient was provided an opportunity to ask questions and all were answered. The patient agreed with the plan and demonstrated an understanding of the instructions.   The patient was advised to call back or seek an in-person evaluation if the symptoms worsen or if the condition fails to improve as anticipated.  I provided 55 minutes of non-face-to-face time during this encounter.   [name], LCSW    THERAPIST PROGRESS NOTE  Session Time: 1330  Participation Level: Active  Behavioral Response: CasualAlertAnxious  Type of Therapy: Individual Therapy  Treatment Goals addressed: Coping  Interventions: Supportive  Summary: Ashley Cross is a 64 y.o. female who presents with continued symptoms related to her diagnosis. Ashley Cross reports doing well since our last session. She notes having one day of depression, which she notes having a difficult time shaking. LCSW validated these feelings and held space for Ashley Cross to discuss her feelings about that day, and what thoughts had her feeling depressed. She reports she was just unable to stop thinking about her daughter and how much she missed her. LCSW validated Ashley Cross's thoughts, and offered insight where appropriate. We discussed ways Ashley Cross could honor her daughter for her birthday coming up. Ashley Cross expressed feeling her daughter would have liked it if she took her dog on a walk, and could get into a habit of doing that. LCSW validated this idea and encouraged Ashley Cross  to take the steps she needs to take in order to feel more comfortable walking the dog. Ashley Cross expressed understanding and agreement with this information. LCSW held space for Ashley Cross to discuss other aspects of her daughter that she would like to honor in some way, and brainstomed ideas with Ashley Cross to do so. We reviewed CBT skills and how to challenge negative thoughts as they come up, and discussed the importance of processing our grief.   Suicidal/Homicidal: No  Therapist Response: Ashley Cross continues to work towards her tx goals but has not yet reached them. We will continue to work on improving emotional regulation skills and distress tolerance moving forward.   Plan: Return again in 2 weeks.  Diagnosis: Axis I: MDD    Axis II: No diagnosis    [name], LCSW 04/23/2019

## 2019-04-23 NOTE — Telephone Encounter (Signed)
Patient informed and verbalized understanding.  She will double her current Micardis pill.   Patient will try the nicotine gum otc.   Patient has an appointment 3/23 in office and we will update on her bp then

## 2019-04-24 ENCOUNTER — Other Ambulatory Visit: Payer: Self-pay

## 2019-04-28 ENCOUNTER — Encounter: Payer: PPO | Admitting: Internal Medicine

## 2019-05-01 ENCOUNTER — Other Ambulatory Visit: Payer: Self-pay | Admitting: Internal Medicine

## 2019-05-01 DIAGNOSIS — J309 Allergic rhinitis, unspecified: Secondary | ICD-10-CM

## 2019-05-01 MED ORDER — MONTELUKAST SODIUM 10 MG PO TABS: 10.0000 mg | ORAL_TABLET | Freq: Every day | ORAL | 3 refills | Status: DC

## 2019-05-06 ENCOUNTER — Other Ambulatory Visit: Payer: Self-pay

## 2019-05-06 ENCOUNTER — Ambulatory Visit (INDEPENDENT_AMBULATORY_CARE_PROVIDER_SITE_OTHER): Payer: PPO | Admitting: Licensed Clinical Social Worker

## 2019-05-06 ENCOUNTER — Encounter: Payer: Self-pay | Admitting: Licensed Clinical Social Worker

## 2019-05-06 DIAGNOSIS — F331 Major depressive disorder, recurrent, moderate: Secondary | ICD-10-CM

## 2019-05-06 NOTE — Progress Notes (Signed)
Virtual Visit via Video Note  I connected with Ashley Cross on 05/06/19 at  1:30 PM EDT by a video enabled telemedicine application and verified that I am speaking with the correct person using two identifiers.   I discussed the limitations of evaluation and management by telemedicine and the availability of in person appointments. The patient expressed understanding and agreed to proceed.  I discussed the assessment and treatment plan with the patient. The patient was provided an opportunity to ask questions and all were answered. The patient agreed with the plan and demonstrated an understanding of the instructions.   The patient was advised to call back or seek an in-person evaluation if the symptoms worsen or if the condition fails to improve as anticipated.  I provided 45 minutes of non-face-to-face time during this encounter.   [name], LCSW    THERAPIST PROGRESS NOTE  Session Time: 1330  Participation Level: Active  Behavioral Response: NeatAlertAnxious  Type of Therapy: Individual Therapy  Treatment Goals addressed: Coping  Interventions: CBT  Summary: Ashley Cross is a 64 y.o. female who presents with continued symptoms related to her diagnosis. Ashley Cross reports doing well since our last session. She reports she finally had her dishwasher installed by her brother, which she was very excited about as she has been trying to have that done for quite sometime. LCSW validated Ashley Cross's feelings and recognized how getting things settled in our homes can allow [place] to feel more settled emotionally. Samaria expressed agreement, and noted she has "still not gone into [name] (her duaghter's) room to start packing it up." LCSW validated Doria's feelings and encouraged her to take things one step at a time. For instance, we discussed packing one box at a time--and allowing herself to process and take her time doing these things. Deem expressed understanding and  agreement with all information presented during today's session.   Suicidal/Homicidal: No  Therapist Response: Ashley Cross continues to work towards her tx goals but has not yet reached them. We will continue to work on improving emotional regulation skills and distress tolerance moving forward.   Plan: Return again in 2 weeks.  Diagnosis: Axis I: MDD    Axis II: No diagnosis    [name], LCSW 05/06/2019

## 2019-05-08 ENCOUNTER — Other Ambulatory Visit: Payer: Self-pay | Admitting: Internal Medicine

## 2019-05-08 DIAGNOSIS — Z1231 Encounter for screening mammogram for malignant neoplasm of breast: Secondary | ICD-10-CM

## 2019-05-08 DIAGNOSIS — Z853 Personal history of malignant neoplasm of breast: Secondary | ICD-10-CM

## 2019-05-14 ENCOUNTER — Ambulatory Visit (INDEPENDENT_AMBULATORY_CARE_PROVIDER_SITE_OTHER): Payer: PPO | Admitting: Pharmacist

## 2019-05-14 DIAGNOSIS — F339 Major depressive disorder, recurrent, unspecified: Secondary | ICD-10-CM | POA: Diagnosis not present

## 2019-05-14 DIAGNOSIS — J449 Chronic obstructive pulmonary disease, unspecified: Secondary | ICD-10-CM | POA: Diagnosis not present

## 2019-05-14 NOTE — Chronic Care Management (AMB) (Signed)
Chronic Care Management   Follow Up Note   05/14/2019 Name: Ashley Cross MRN: [id number] DOB: 03-09-1955  Referred by: McLean-Scocuzza, Nino Glow, MD Reason for referral : Chronic Care Management (Medication Management)   Ashley Cross is a 64 y.o. year old female who is a primary care patient of McLean-Scocuzza, Nino Glow, MD. The CCM team was consulted for assistance with chronic disease management and care coordination needs.    Contacted patient for medication management f/u.   Review of patient status, including review of consultants reports, relevant laboratory and other test results, and collaboration with appropriate care team members and the patient's provider was performed as part of comprehensive patient evaluation and provision of chronic care management services.    SDOH (Social Determinants of Health) assessments performed: No See Care Plan activities for detailed interventions related to [facility])     Outpatient Encounter Medications as of 05/14/2019  Medication Sig  . ARIPiprazole (ABILIFY) 10 MG tablet Take 1 tablet (10 mg total) by mouth daily.  Marland Kitchen azelastine (ASTELIN) 0.1 % nasal spray   . buPROPion (WELLBUTRIN XL) 300 MG 24 hr tablet Take 1 tablet (300 mg total) by mouth daily.  . cyanocobalamin 100 MCG tablet Take 100 mcg by mouth daily.  . DULoxetine (CYMBALTA) 30 MG capsule Take 3 capsules (90 mg total) by mouth daily.  Derrill Memo ON 05/15/2019] eszopiclone (LUNESTA) 1 MG TABS tablet Take 1 tablet (1 mg total) by mouth at bedtime. Take immediately before bedtime  . gabapentin (NEURONTIN) 600 MG tablet Take 1,200 mg by mouth 3 (three) times daily.  . hydrochlorothiazide (HYDRODIURIL) 12.5 MG tablet Take 1 tablet (12.5 mg total) by mouth daily. In am  . letrozole (FEMARA) 2.5 MG tablet Take 1 tablet (2.5 mg total) by mouth daily.  . montelukast (SINGULAIR) 10 MG tablet Take 1 tablet (10 mg total) by mouth at bedtime.  . Multiple Vitamins-Minerals (MULTIVITAMIN)  tablet Take 1 tablet by mouth daily.  Marland Kitchen omeprazole (PRILOSEC) 20 MG capsule   . oxymorphone (OPANA) 10 MG tablet Take 10 mg by mouth 2 (two) times daily.   Marland Kitchen telmisartan (MICARDIS) 80 MG tablet Take 1 tablet (80 mg total) by mouth daily. At night  . albuterol (VENTOLIN HFA) 108 (90 Base) MCG/ACT inhaler Inhale 1-2 puffs into the lungs every 4 (four) hours as needed for wheezing or shortness of breath.  . Cholecalciferol (D3 ADULT PO) Take by mouth. 10000 IU weekly  . diazepam (VALIUM) 5 MG tablet Take 1 tablet (5 mg total) by mouth at bedtime as needed for muscle spasms or anxiety. (Patient not taking: Reported on 01/22/2019)  . fluticasone (FLONASE) 50 MCG/ACT nasal spray Place 2 sprays into both nostrils daily. Max 2 sprays  . hydrOXYzine (VISTARIL) 25 MG capsule Take 1 capsule (25 mg total) by mouth every 8 (eight) hours as needed for anxiety. (Patient not taking: Reported on 05/14/2019)  . ipratropium (ATROVENT) 0.06 % nasal spray Place 2 sprays into both nostrils 3 (three) times daily. prn  . loratadine (CLARITIN) 10 MG tablet Take 1 tablet (10 mg total) by mouth daily as needed for allergies. At night (Patient not taking: Reported on 01/22/2019)  . senna-docusate (SENOKOT-S) 8.6-50 MG tablet Take 1-2 tablets by mouth daily as needed for mild constipation.  Marland Kitchen umeclidinium-vilanterol (ANORO ELLIPTA) 62.5-25 MCG/INH AEPB Inhale 1 puff into the lungs daily.   No facility-administered encounter medications on file as of 05/14/2019.     Objective:   Goals Addressed  This Visit's Progress     Patient Stated   . "I want to be healthy" (pt-stated)       CARE PLAN ENTRY (see longtitudinal plan of care for additional care plan information)  Current Barriers:  . Polypharmacy; complex patient with multiple comorbidities including chronic pain, depression, hx breast cancer, tobacco use disorder, alcohol abuse, HTN . Notes that she's having a bit of a low day, but overall doing well.  Feels that her current psych regimen  . Notes that she and one of her decreased daughter's friends got together and had a picnic to celebrate [name]'s birthday. Notes that it was a good time, and that she was happy to be able to be in a celebratory mood.  . Still interested in tobacco cessation. Notes that she hasn't been able to quit or cut back as much as anticipated, but plans to discuss pharmacotherapy options at upcoming appointment . Reports continued concerns with finances. Contacted the Credit Counseling resources that were provided to her by Care Guide/LCSW, but notes that she was unable to afford any of their solutions. Does not think that she could hold down a job with her current emotional and chronic pain o Major depression: exacerbated by death of her daughter, [name]. Follows w/ Dr. Shea Evans, therapy w/ LCSW [name]; duloxetine 90 mg daily (30 mg x3) bupropion XL 300 mg daily, aripiprazole 10 mg daily, hydroxyzine 25 mg PRN o Insomina: Lunesta 1 mg QPM, PRN hydroxyzine; notes she is sleeping really well right now o Chronic pain: Opana 10 mg BID; gabapentin 1200 mg TID (6 600 mg tabs daily) o HTN: telmisartan 80 mg QAM, HCTZ 25 mg QAM o Hx breast cancer: letrozole 2.5 mg daily o Tobacco abuse/COPD: Anoro daily (notes that she doesn't have Anoro because the cost went back up to $45/month and she cannot afford this) albuterol HFA 3-4 times weekly; loratadine 10 mg daily + montelukast 10 mg daily, fluticasone and azelastine nasal sprays daily - Interested in NRT therapy vs Chantix. Notes that gum was helpful before, but has never tried patch + gum/lozenge - Tobacco use: up to 3/4 ppd daily.  Pharmacist Clinical Goal(s):  Marland Kitchen Over the next 90 days, patient will work with PharmD and provider towards optimized medication management  Interventions: . Comprehensive medication review performed, medication list updated in electronic medical record.  Marland Kitchen Unsure why Medicare Extra Help is no longer  active. Income has not changed. Placed Care Guide referral for support with completion of Medicare Extra Help application. If for some reason Extra Help is denied, she will NOT qualify for Anoro patient assistance until she has spent at least $600 out of pocket on copays; however, she would qualify for Stiolto assistance. Will continue to follow to support in medication access.  . Discussed Chantix vs dual NRT to help w/ goals of tobacco cessation. Encouraged her to discuss w/ PCP at upcoming appointment next month. Encouraged to contact [place] Quitline.  Patient Self Care Activities:  . Patient will take medications as prescribed  Please see past updates related to this goal by clicking on the "Past Updates" button in the selected goal          Plan:  - Scheduled f/u call 07/02/19  [name], PharmD, BCACP, [place] Pharmacist [place] [phone]

## 2019-05-14 NOTE — Patient Instructions (Signed)
Visit Information  Goals Addressed            This Visit's Progress     Patient Stated   . "I want to be healthy" (pt-stated)       CARE PLAN ENTRY (see longtitudinal plan of care for additional care plan information)  Current Barriers:  . Polypharmacy; complex patient with multiple comorbidities including chronic pain, depression, hx breast cancer, tobacco use disorder, alcohol abuse, HTN . Notes that she's having a bit of a low day, but overall doing well. Feels that her current psych regimen  . Notes that she and one of her decreased daughter's friends got together and had a picnic to celebrate [name]'s birthday. Notes that it was a good time, and that she was happy to be able to be in a celebratory mood.  . Still interested in tobacco cessation. Notes that she hasn't been able to quit or cut back as much as anticipated, but plans to discuss pharmacotherapy options at upcoming appointment . Reports continued concerns with finances. Contacted the Credit Counseling resources that were provided to her by Care Guide/LCSW, but notes that she was unable to afford any of their solutions. Does not think that she could hold down a job with her current emotional and chronic pain o Major depression: exacerbated by death of her daughter, [name]. Follows w/ Dr. Shea Evans, therapy w/ LCSW [name]; duloxetine 90 mg daily (30 mg x3) bupropion XL 300 mg daily, aripiprazole 10 mg daily, hydroxyzine 25 mg PRN o Insomina: Lunesta 1 mg QPM, PRN hydroxyzine; notes she is sleeping really well right now o Chronic pain: Opana 10 mg BID; gabapentin 1200 mg TID (6 600 mg tabs daily) o HTN: telmisartan 80 mg QAM, HCTZ 25 mg QAM o Hx breast cancer: letrozole 2.5 mg daily o Tobacco abuse/COPD: Anoro daily (notes that she doesn't have Anoro because the cost went back up to $45/month and she cannot afford this) albuterol HFA 3-4 times weekly; loratadine 10 mg daily + montelukast 10 mg daily, fluticasone and azelastine  nasal sprays daily - Interested in NRT therapy vs Chantix. Notes that gum was helpful before, but has never tried patch + gum/lozenge - Tobacco use: up to 3/4 ppd daily.  Pharmacist Clinical Goal(s):  Marland Kitchen Over the next 90 days, patient will work with PharmD and provider towards optimized medication management  Interventions: . Comprehensive medication review performed, medication list updated in electronic medical record.  Marland Kitchen Unsure why Medicare Extra Help is no longer active. Income has not changed. Placed Care Guide referral for support with completion of Medicare Extra Help application. If for some reason Extra Help is denied, she will NOT qualify for Anoro patient assistance until she has spent at least $600 out of pocket on copays; however, she would qualify for Stiolto assistance. Will continue to follow to support in medication access.  . Discussed Chantix vs dual NRT to help w/ goals of tobacco cessation. Encouraged her to discuss w/ PCP at upcoming appointment next month. Encouraged to contact [place] Quitline.  Patient Self Care Activities:  . Patient will take medications as prescribed  Please see past updates related to this goal by clicking on the "Past Updates" button in the selected goal         Patient verbalizes understanding of instructions provided today.  Plan:  - Scheduled f/u call 07/02/19  [name], PharmD, BCACP, CPP Clinical Pharmacist [facility] [place] [phone]

## 2019-05-19 DIAGNOSIS — M961 Postlaminectomy syndrome, not elsewhere classified: Secondary | ICD-10-CM | POA: Diagnosis not present

## 2019-05-21 ENCOUNTER — Encounter: Payer: Self-pay | Admitting: Psychiatry

## 2019-05-21 ENCOUNTER — Ambulatory Visit (INDEPENDENT_AMBULATORY_CARE_PROVIDER_SITE_OTHER): Payer: PPO | Admitting: Psychiatry

## 2019-05-21 ENCOUNTER — Other Ambulatory Visit: Payer: Self-pay

## 2019-05-21 DIAGNOSIS — F331 Major depressive disorder, recurrent, moderate: Secondary | ICD-10-CM | POA: Diagnosis not present

## 2019-05-21 DIAGNOSIS — F1010 Alcohol abuse, uncomplicated: Secondary | ICD-10-CM | POA: Diagnosis not present

## 2019-05-21 DIAGNOSIS — F17200 Nicotine dependence, unspecified, uncomplicated: Secondary | ICD-10-CM

## 2019-05-21 NOTE — Progress Notes (Signed)
Provider Location : [facility] Patient Location : Home  Virtual Visit via Video Note  I connected with Sahalie Connor on 05/21/19 at  3:30 PM EDT by a video enabled telemedicine application and verified that I am speaking with the correct person using two identifiers.   I discussed the limitations of evaluation and management by telemedicine and the availability of in person appointments. The patient expressed understanding and agreed to proceed.     I discussed the assessment and treatment plan with the patient. The patient was provided an opportunity to ask questions and all were answered. The patient agreed with the plan and demonstrated an understanding of the instructions.   The patient was advised to call back or seek an in-person evaluation if the symptoms worsen or if the condition fails to improve as anticipated. [place] MD OP Progress Note  05/21/2019 6:23 PM Makaley Pester  MRN:  [id number]  Chief Complaint:  Chief Complaint    Follow-up     HPI: Esmae is a 64 year old Caucasian female on disability, lives in [place], has a history of MDD, alcohol use disorder, history of breast cancer, chronic pain, hypertension was evaluated by telemedicine today.  Patient today reports she is currently making progress with regards to her mood symptoms.  Her Cymbalta and Wellbutrin does help.  She denies any side effects.  Patient reports she is also compliant on her Abilify.  She denies any EPS.  She continues to sleep well.  She is cutting back on alcohol use and may have had only a couple of drinks the past few weeks.  She is making real progress with that.  She reports she continues to grieve the loss of her daughter.  She is currently working with her therapist on the same.  Patient denies any suicidality, homicidality or perceptual disturbances.  Patient reports she has financial stressors and hence is currently looking for a job.  She used to work as a Designer, industrial/product in the past.  She is currently trying to get her license back.  She however does not know if she will be able to do the same job again.  She however would like to do something.  Patient denies any other concerns today.   Visit Diagnosis:    ICD-10-CM   1. MDD (major depressive disorder), recurrent episode, moderate (HCC)  F33.1   2. Alcohol use disorder, mild, abuse  F10.10   3. Tobacco use disorder  F17.200     Past Psychiatric History: I have reviewed past psychiatric history from my progress note on 09/16/2018.  Past trials of Zoloft, Wellbutrin, Lexapro, Celexa.  Past Medical History:  Past Medical History:  Diagnosis Date  . Anxiety   . Breast cancer ([place])    Left breast(nipple mass) 2018  . Cervical stenosis of spine   . Chicken pox   . Chronic back pain   . Colon polyps   . COPD (chronic obstructive pulmonary disease) ([place])   . Cyst of left nipple   . Depression   . Hypertension   . Insomnia   . Personal history of radiation therapy     Past Surgical History:  Procedure Laterality Date  . BACK SURGERY     x 3 (2010 x 2; 2013 x 1)   . BREAST BIOPSY Left    core- neg  . BREAST LUMPECTOMY Left 2018  . BREAST SURGERY Left    breast biopsy  . CERVICAL CONE BIOPSY    . CERVICAL FUSION  times 2  . COLONOSCOPY WITH PROPOFOL N/A 04/18/2015   Procedure: COLONOSCOPY WITH PROPOFOL;  Surgeon: [name], MD;  Location: [facility];  Service: Endoscopy;  Laterality: N/A;  . ELBOW SURGERY     05/14/12 b/l elbows per pt ulnar nerve   . ELBOW SURGERY     x2   . EPIDURAL BLOCK INJECTION     Dr. Maryjean Ka   . MASS EXCISION Left 04/12/2016   Procedure: EXCISION OF LEFT NIPPLE MASS;  Surgeon: Stark Klein, MD;  Location: [facility];  Service: General;  Laterality: Left;  . NECK SURGERY    . POSTERIOR CERVICAL FUSION/FORAMINOTOMY  03/07/2011   Procedure: POSTERIOR CERVICAL FUSION/FORAMINOTOMY LEVEL 4;  Surgeon: Eustace Moore, MD;  Location: [facility]  NEURO ORS;  Service: Neurosurgery;  Laterality: N/A;  cervical three to thoracic one posterior cervical fusion   . SENTINEL NODE BIOPSY Left 05/29/2016   Procedure: LEFT SENTINEL LYMPH NODE BIOPSY;  Surgeon: Stark Klein, MD;  Location: [facility];  Service: General;  Laterality: Left;  . TONSILLECTOMY      Family Psychiatric History: I have reviewed family psychiatric history from my progress note on 09/16/2018.  Family History:  Family History  Problem Relation Age of Onset  . Breast cancer Mother 58  . Hypertension Mother   . Cancer Mother        breast dx'ed 44   . Cancer Father        prostate  . Hypertension Father   . Drug abuse Daughter   . Hypertension Maternal Grandmother   . Breast cancer Cousin     Social History: I have reviewed social history from my progress note on 09/16/2018. Social History   Socioeconomic History  . Marital status: Divorced    Spouse name: Not on file  . Number of children: 2  . Years of education: Not on file  . Highest education level: Not on file  Occupational History  . Not on file  Tobacco Use  . Smoking status: Current Every Day Smoker    Packs/day: 0.75    Years: 30.00    Pack years: 22.50    Types: Cigarettes  . Smokeless tobacco: Never Used  . Tobacco comment: cutting back - half pask reported 01/13/2019  Substance and Sexual Activity  . Alcohol use: Yes    Comment: social  . Drug use: No    Comment: on opana since jan 2018  . Sexual activity: Not on file  Other Topics Concern  . Not on file  Social History Narrative   As of 01/25/17 not sexually active of in relationship    Divorced   2 kids son and daughter (though daughter died of suicide in 2017/05/14)   Son lives in [place] now as of 12/2018       Disability    Loves animals has 3 dogs           Social Determinants of Radio broadcast assistant Strain: High Risk  . Difficulty of Paying Living Expenses: Hard  Food Insecurity:   . Worried About Charity fundraiser  in the Last Year:   . Arboriculturist in the Last Year:   Transportation Needs:   . Film/video editor (Medical):   Marland Kitchen Lack of Transportation (Non-Medical):   Physical Activity:   . Days of Exercise per Week:   . Minutes of Exercise per Session:   Stress:   . Feeling of Stress :  Social Connections:   . Frequency of Communication with Friends and Family:   . Frequency of Social Gatherings with Friends and Family:   . Attends Religious Services:   . Active Member of Clubs or Organizations:   . Attends Archivist Meetings:   Marland Kitchen Marital Status:     Allergies:  Allergies  Allergen Reactions  . Sulfa Antibiotics Itching and Rash  . Sulfonamide Derivatives Itching and Rash    Metabolic Disorder Labs: Lab Results  Component Value Date   HGBA1C 5.1 02/27/2019   No results found for: PROLACTIN Lab Results  Component Value Date   CHOL 219 (H) 02/27/2019   TRIG 54.0 02/27/2019   HDL 133.80 02/27/2019   CHOLHDL 2 02/27/2019   VLDL 10.8 02/27/2019   LDLCALC 74 02/27/2019   LDLCALC 81 02/13/2018   Lab Results  Component Value Date   TSH 2.91 04/03/2019   TSH 7.36 (H) 02/27/2019    Therapeutic Level Labs: No results found for: LITHIUM No results found for: VALPROATE No components found for:  CBMZ  Current Medications: Current Outpatient Medications  Medication Sig Dispense Refill  . albuterol (VENTOLIN HFA) 108 (90 Base) MCG/ACT inhaler Inhale 1-2 puffs into the lungs every 4 (four) hours as needed for wheezing or shortness of breath. 18 g 12  . ARIPiprazole (ABILIFY) 10 MG tablet Take 1 tablet (10 mg total) by mouth daily. 90 tablet 0  . azelastine (ASTELIN) 0.1 % nasal spray     . buPROPion (WELLBUTRIN XL) 300 MG 24 hr tablet Take 1 tablet (300 mg total) by mouth daily. 90 tablet 3  . CHANTIX STARTING MONTH PAK 0.5 MG X 11 & 1 MG X 42 tablet See admin instructions.    . Cholecalciferol (D3 ADULT PO) Take by mouth. 10000 IU weekly    . cyanocobalamin 100  MCG tablet Take 100 mcg by mouth daily.    . diazepam (VALIUM) 5 MG tablet Take 1 tablet (5 mg total) by mouth at bedtime as needed for muscle spasms or anxiety. (Patient not taking: Reported on 01/22/2019) 30 tablet 5  . DULoxetine (CYMBALTA) 30 MG capsule Take 3 capsules (90 mg total) by mouth daily. 270 capsule 0  . eszopiclone (LUNESTA) 1 MG TABS tablet Take 1 tablet (1 mg total) by mouth at bedtime. Take immediately before bedtime 30 tablet 1  . fluticasone (FLONASE) 50 MCG/ACT nasal spray Place 2 sprays into both nostrils daily. Max 2 sprays 16 g 11  . gabapentin (NEURONTIN) 600 MG tablet Take 1,200 mg by mouth 3 (three) times daily.    . hydrochlorothiazide (HYDRODIURIL) 12.5 MG tablet Take 1 tablet (12.5 mg total) by mouth daily. In am 90 tablet 3  . hydrOXYzine (VISTARIL) 25 MG capsule Take 1 capsule (25 mg total) by mouth every 8 (eight) hours as needed for anxiety. (Patient not taking: Reported on 05/14/2019) 90 capsule 1  . ipratropium (ATROVENT) 0.06 % nasal spray Place 2 sprays into both nostrils 3 (three) times daily. prn 15 mL 12  . letrozole (FEMARA) 2.5 MG tablet Take 1 tablet (2.5 mg total) by mouth daily. 90 tablet 3  . loratadine (CLARITIN) 10 MG tablet Take 1 tablet (10 mg total) by mouth daily as needed for allergies. At night (Patient not taking: Reported on 01/22/2019) 90 tablet 3  . montelukast (SINGULAIR) 10 MG tablet Take 1 tablet (10 mg total) by mouth at bedtime. 90 tablet 3  . Multiple Vitamins-Minerals (MULTIVITAMIN) tablet Take 1 tablet by mouth daily. [place]  tablet 3  . omeprazole (PRILOSEC) 20 MG capsule     . oxymorphone (OPANA) 10 MG tablet Take 10 mg by mouth 2 (two) times daily.     Marland Kitchen senna-docusate (SENOKOT-S) 8.6-50 MG tablet Take 1-2 tablets by mouth daily as needed for mild constipation. 180 tablet 3  . telmisartan (MICARDIS) 80 MG tablet Take 1 tablet (80 mg total) by mouth daily. At night 90 tablet 3  . umeclidinium-vilanterol (ANORO ELLIPTA) 62.5-25 MCG/INH  AEPB Inhale 1 puff into the lungs daily. 60 each 12   No current facility-administered medications for this visit.     Musculoskeletal: Strength & Muscle Tone: UTA Gait & Station: normal Patient leans: N/A  Psychiatric Specialty Exam: Review of Systems  Psychiatric/Behavioral: Positive for dysphoric mood.  All other systems reviewed and are negative.   There were no vitals taken for this visit.There is no height or weight on file to calculate BMI.  General Appearance: Casual  Eye Contact:  Fair  Speech:  Clear and Coherent  Volume:  Normal  Mood:  Dysphoric improving  Affect:  Congruent  Thought Process:  Goal Directed and Descriptions of Associations: Intact  Orientation:  Full (Time, Place, and Person)  Thought Content: Logical   Suicidal Thoughts:  No  Homicidal Thoughts:  No  Memory:  Immediate;   Fair Recent;   Fair Remote;   Fair  Judgement:  Fair  Insight:  Fair  Psychomotor Activity:  Normal  Concentration:  Concentration: Fair and Attention Span: Fair  Recall:  [employer] of Knowledge: Fair  Language: Fair  Akathisia:  No  Handed:  Right  AIMS (if indicated): UTA  Assets:  Communication Skills Desire for Improvement Housing Social Support  ADL's:  Intact  Cognition: WNL  Sleep:  Fair   Screenings: GAD-7     Office Visit from 09/30/2017 in [facility]  Total GAD-7 Score  18    PHQ2-9     Office Visit from 12/31/2018 in [facility] from 09/16/2018 in [facility] Visit from 09/30/2017 in [facility] Office Visit from 08/19/2017 in [facility] from 06/13/2016 in [facility]  PHQ-2 Total Score  0  0  4  4  1   PHQ-9 Total Score  -  -  18  16  -       Assessment and Plan: Quanique is a 64 year old Caucasian female, divorced, disabled, has a history of depression, chronic pain,  hypertension, history of breast cancer was evaluated by telemedicine today.  She is biologically predisposed given her family history as well as substance abuse problems.  Patient with psychosocial stressors of her own health issues, financial problems, death of her daughter a year and a half ago.  Patient is currently making progress.  Plan as noted below.  Plan MDD-improving Cymbalta 90 mg p.o. daily Wellbutrin at reduced dosage of 300 mg p.o. daily Abilify 10 mg p.o. daily Lunesta 1 mg p.o. nightly for sleep Hydroxyzine 25 mg p.o. daily as needed for anxiety symptoms. Patient has upcoming appointment with Ms. [name] tomorrow.  She will continue psychotherapy sessions.  Alcohol use disorder mild-improving Patient continues to cut back on her alcohol use.  Tobacco use disorder-improving Patient continues to cut back on her smoking cigarettes  Follow-up in clinic in 4 to 6 weeks or sooner if needed.  I have spent atleast 20 minutes non face to face  with patient today. More than 50 % of the time was spent for preparing to see the patient ( e.g., review of test, records ), obtaining and to review and separately obtained history , ordering medications and test ,psychoeducation and supportive psychotherapy and care coordination,as well as documenting clinical information in electronic health record. This note was generated in part or whole with voice recognition software. Voice recognition is usually quite accurate but there are transcription errors that can and very often do occur. I apologize for any typographical errors that were not detected and corrected.         Ursula Alert, MD 05/21/2019, 6:23 PM

## 2019-05-22 ENCOUNTER — Telehealth (INDEPENDENT_AMBULATORY_CARE_PROVIDER_SITE_OTHER): Payer: PPO | Admitting: Licensed Clinical Social Worker

## 2019-05-22 ENCOUNTER — Encounter: Payer: Self-pay | Admitting: Licensed Clinical Social Worker

## 2019-05-22 DIAGNOSIS — F331 Major depressive disorder, recurrent, moderate: Secondary | ICD-10-CM

## 2019-05-22 NOTE — Progress Notes (Signed)
Virtual Visit via Video Note  I connected with Ashley Cross on 05/22/19 at 11:00 AM EDT by a video enabled telemedicine application and verified that I am speaking with the correct person using two identifiers.   I discussed the limitations of evaluation and management by telemedicine and the availability of in person appointments. The patient expressed understanding and agreed to proceed.   I discussed the assessment and treatment plan with the patient. The patient was provided an opportunity to ask questions and all were answered. The patient agreed with the plan and demonstrated an understanding of the instructions.   The patient was advised to call back or seek an in-person evaluation if the symptoms worsen or if the condition fails to improve as anticipated.  I provided 60 minutes of non-face-to-face time during this encounter.   [name], LCSW   THERAPIST PROGRESS NOTE  Session Time: 1100  Participation Level: Active  Behavioral Response: CasualAlertAnxious  Type of Therapy: Individual Therapy  Treatment Goals addressed: Coping  Interventions: Supportive  Summary: Ashley Cross is a 64 y.o. female who presents with continued symptoms related to her diagnosis. Ashley Cross reports doing well since our last session. She reports her finches have laid eggs, which she was very, very excited about. LCSW validated this excitement and recognized how much happiness animals can bring into our lives. Everlean went on to discussing a friend of hers that has asked her to speak with a young lady who is going into drug detox. We discussed the feelings/thoughts this could bring up for Ashley Cross, but Ashley Cross ultimately decided it would be a positive thing to do. "I want to be able to help if I can, through all of the things I've gone through." LCSW validated this motivation and encouraged Ashley Cross to pay attention to her feelings throughout this process, and make sure to be protective of  her mental health throughout. Ashley Cross expressed understanding and agreement with this information as well.   Suicidal/Homicidal: No  Therapist Response: Ashley Cross continues to work towards her tx goals but has not yet reached them. We will continue to work on improving emotional regulation skills and distress tolerance moving forward.   Plan: Return again in 4 weeks.  Diagnosis: Axis I: MDD    Axis II: No diagnosis    [name], LCSW 05/22/2019

## 2019-06-03 ENCOUNTER — Telehealth: Payer: Self-pay | Admitting: Internal Medicine

## 2019-06-03 NOTE — Telephone Encounter (Signed)
   [facility] 06/03/2019   Name: Ashley Cross   MRN: [id number]   DOB: 10/01/1955   AGE: 64 y.o.   GENDER: female   PCP McLean-Scocuzza, Nino Glow, MD.   Called pt regarding Community Resource Referral for assistance with Medicare Extra Help application. Patient stated that she would like for Care Guide to give her a call back to help assist with application.   [place], Care Management Phone: [phone] Email: [email or web address]

## 2019-06-08 ENCOUNTER — Ambulatory Visit
Admission: RE | Admit: 2019-06-08 | Discharge: 2019-06-08 | Disposition: A | Discharge status: Home / Self Care | Payer: PPO | Source: Ambulatory Visit | Attending: Internal Medicine | Admitting: Internal Medicine

## 2019-06-08 ENCOUNTER — Other Ambulatory Visit: Payer: Self-pay

## 2019-06-08 DIAGNOSIS — R922 Inconclusive mammogram: Secondary | ICD-10-CM | POA: Diagnosis not present

## 2019-06-08 DIAGNOSIS — Z1231 Encounter for screening mammogram for malignant neoplasm of breast: Secondary | ICD-10-CM

## 2019-06-08 DIAGNOSIS — Z853 Personal history of malignant neoplasm of breast: Secondary | ICD-10-CM

## 2019-06-12 ENCOUNTER — Ambulatory Visit (INDEPENDENT_AMBULATORY_CARE_PROVIDER_SITE_OTHER): Payer: PPO | Admitting: Internal Medicine

## 2019-06-12 ENCOUNTER — Other Ambulatory Visit: Payer: Self-pay

## 2019-06-12 ENCOUNTER — Other Ambulatory Visit (HOSPITAL_COMMUNITY)
Admission: RE | Admit: 2019-06-12 | Discharge: 2019-06-12 | Disposition: A | Discharge status: Home / Self Care | Payer: PPO | Source: Ambulatory Visit | Attending: Internal Medicine | Admitting: Internal Medicine

## 2019-06-12 ENCOUNTER — Encounter: Payer: Self-pay | Admitting: Internal Medicine

## 2019-06-12 VITALS — BP 140/82 | HR 78 | Temp 97.8°F | Ht 64.17 in | Wt 127.0 lb

## 2019-06-12 DIAGNOSIS — F17210 Nicotine dependence, cigarettes, uncomplicated: Secondary | ICD-10-CM | POA: Diagnosis not present

## 2019-06-12 DIAGNOSIS — R946 Abnormal results of thyroid function studies: Secondary | ICD-10-CM

## 2019-06-12 DIAGNOSIS — R1310 Dysphagia, unspecified: Secondary | ICD-10-CM

## 2019-06-12 DIAGNOSIS — Z1151 Encounter for screening for human papillomavirus (HPV): Secondary | ICD-10-CM | POA: Insufficient documentation

## 2019-06-12 DIAGNOSIS — I10 Essential (primary) hypertension: Secondary | ICD-10-CM | POA: Diagnosis not present

## 2019-06-12 DIAGNOSIS — Z720 Tobacco use: Secondary | ICD-10-CM | POA: Diagnosis not present

## 2019-06-12 DIAGNOSIS — Z124 Encounter for screening for malignant neoplasm of cervix: Secondary | ICD-10-CM | POA: Insufficient documentation

## 2019-06-12 DIAGNOSIS — Z113 Encounter for screening for infections with a predominantly sexual mode of transmission: Secondary | ICD-10-CM

## 2019-06-12 DIAGNOSIS — F419 Anxiety disorder, unspecified: Secondary | ICD-10-CM

## 2019-06-12 DIAGNOSIS — F5104 Psychophysiologic insomnia: Secondary | ICD-10-CM | POA: Diagnosis not present

## 2019-06-12 DIAGNOSIS — F329 Major depressive disorder, single episode, unspecified: Secondary | ICD-10-CM | POA: Diagnosis not present

## 2019-06-12 DIAGNOSIS — Z780 Asymptomatic menopausal state: Secondary | ICD-10-CM | POA: Diagnosis not present

## 2019-06-12 DIAGNOSIS — F32A Depression, unspecified: Secondary | ICD-10-CM

## 2019-06-12 MED ORDER — METOPROLOL SUCCINATE ER 25 MG PO TB24: 25.0000 mg | ORAL_TABLET | Freq: Every day | ORAL | 3 refills | Status: DC

## 2019-06-12 MED ORDER — NICOTINE 21 MG/24HR TD PT24: 21.0000 mg | MEDICATED_PATCH | Freq: Every day | TRANSDERMAL | 0 refills | Status: DC

## 2019-06-12 MED ORDER — TELMISARTAN-HCTZ 80-25 MG PO TABS: 1.0000 | ORAL_TABLET | Freq: Every day | ORAL | 3 refills | Status: DC

## 2019-06-12 MED ORDER — NICOTINE 7 MG/24HR TD PT24: 7.0000 mg | MEDICATED_PATCH | Freq: Every day | TRANSDERMAL | 0 refills | Status: DC

## 2019-06-12 MED ORDER — NICOTINE 14 MG/24HR TD PT24: 14.0000 mg | MEDICATED_PATCH | Freq: Every day | TRANSDERMAL | 0 refills | Status: DC

## 2019-06-12 NOTE — Progress Notes (Signed)
Chief Complaint  Patient presents with  . Annual Exam  . Gynecologic Exam   F/u with pap  1. Anxiety and depression/insomnia GAD 7 18 and PHQ 9 score 17 today stopped abilify 10 mg 06/11/19 due to making her eat a lot and gain weight, on wellbutrin 300 mg qd, cymbalta 90 mg qd, hydroxyzine 25 mg qd prn, off valium 5 mg qd prn   Needs new therapist old Lowella Grip stopped working there   2. HTN uncontrolled on micardis 80 mg and hctz 25 mg qd her home readings at still 140s prev norvasc made her have swelling  3. Tobacco abuse smoking a little more than 10 cigs/day on 2nd week of chantix still smoking on wellbutrin 300 mg qd wants to see if insurance will cover nicotine patches   4. Dysphagia for pills but trying to swallow multiple pills at once sometimes 3   Review of Systems  Constitutional: Negative for weight loss.  HENT: Negative for hearing loss.   Eyes: Negative for blurred vision.  Respiratory: Negative for shortness of breath and wheezing.   Cardiovascular: Negative for chest pain.  Gastrointestinal: Negative for abdominal pain.       +trouble swallowing at times   Musculoskeletal: Positive for falls.       X 3 w/o injury   Skin: Negative for rash.  Neurological: Negative for headaches.  Psychiatric/Behavioral: Positive for depression. The patient is nervous/anxious and has insomnia.    Past Medical History:  Diagnosis Date  . Anxiety   . Breast cancer ([place])    Left breast(nipple mass) 2018  . Cervical stenosis of spine   . Chicken pox   . Chronic back pain   . Colon polyps   . COPD (chronic obstructive pulmonary disease) ([place])   . Cyst of left nipple   . Depression   . Hypertension   . Insomnia   . Personal history of radiation therapy    Past Surgical History:  Procedure Laterality Date  . BACK SURGERY     x 3 (2010 x 2; 2013 x 1)   . BREAST BIOPSY Left    core- neg  . BREAST LUMPECTOMY Left 2018  . BREAST SURGERY Left    breast biopsy  . CERVICAL CONE  BIOPSY    . CERVICAL FUSION     times 2  . COLONOSCOPY WITH PROPOFOL N/A 04/18/2015   Procedure: COLONOSCOPY WITH PROPOFOL;  Surgeon: [name], MD;  Location: [facility] ENDOSCOPY;  Service: Endoscopy;  Laterality: N/A;  . ELBOW SURGERY     2014 b/l elbows per pt ulnar nerve   . ELBOW SURGERY     x2   . EPIDURAL BLOCK INJECTION     Dr. Maryjean Ka   . MASS EXCISION Left 04/12/2016   Procedure: EXCISION OF LEFT NIPPLE MASS;  Surgeon: Stark Klein, MD;  Location: [place];  Service: General;  Laterality: Left;  . NECK SURGERY    . POSTERIOR CERVICAL FUSION/FORAMINOTOMY  03/07/2011   Procedure: POSTERIOR CERVICAL FUSION/FORAMINOTOMY LEVEL 4;  Surgeon: Eustace Moore, MD;  Location: [place] NEURO ORS;  Service: Neurosurgery;  Laterality: N/A;  cervical three to thoracic one posterior cervical fusion   . SENTINEL NODE BIOPSY Left 05/29/2016   Procedure: LEFT SENTINEL LYMPH NODE BIOPSY;  Surgeon: Stark Klein, MD;  Location: [place];  Service: General;  Laterality: Left;  . TONSILLECTOMY     Family History  Problem Relation Age of Onset  . Breast cancer Mother 63  .  Hypertension Mother   . Cancer Mother        breast dx'ed 38   . Cancer Father        prostate  . Hypertension Father   . Drug abuse Daughter   . Hypertension Maternal Grandmother   . Breast cancer Cousin    Social History   Socioeconomic History  . Marital status: Divorced    Spouse name: Not on file  . Number of children: 2  . Years of education: Not on file  . Highest education level: Not on file  Occupational History  . Not on file  Tobacco Use  . Smoking status: Current Every Day Smoker    Packs/day: 0.75    Years: 30.00    Pack years: 22.50    Types: Cigarettes  . Smokeless tobacco: Never Used  . Tobacco comment: cutting back - half pask reported 01/13/2019  Substance and Sexual Activity  . Alcohol use: Yes    Comment: social  . Drug use: No    Comment: on opana since jan 2018  . Sexual  activity: Not on file  Other Topics Concern  . Not on file  Social History Narrative   As of 01/25/17 not sexually active of in relationship    Divorced   2 kids son and daughter (though daughter died of suicide in Apr 30, 2017)   Son lives in [place] now as of 12/2018       Disability    Loves animals has 3 dogs           Social Determinants of Radio broadcast assistant Strain: High Risk  . Difficulty of Paying Living Expenses: Hard  Food Insecurity:   . Worried About Charity fundraiser in the Last Year:   . Arboriculturist in the Last Year:   Transportation Needs:   . Film/video editor (Medical):   Marland Kitchen Lack of Transportation (Non-Medical):   Physical Activity:   . Days of Exercise per Week:   . Minutes of Exercise per Session:   Stress:   . Feeling of Stress :   Social Connections:   . Frequency of Communication with Friends and Family:   . Frequency of Social Gatherings with Friends and Family:   . Attends Religious Services:   . Active Member of Clubs or Organizations:   . Attends Archivist Meetings:   Marland Kitchen Marital Status:   Intimate Partner Violence:   . Fear of Current or Ex-Partner:   . Emotionally Abused:   Marland Kitchen Physically Abused:   . Sexually Abused:    Current Meds  Medication Sig  . albuterol (VENTOLIN HFA) 108 (90 Base) MCG/ACT inhaler Inhale 1-2 puffs into the lungs every 4 (four) hours as needed for wheezing or shortness of breath.  Marland Kitchen azelastine (ASTELIN) 0.1 % nasal spray   . buPROPion (WELLBUTRIN XL) 300 MG 24 hr tablet Take 1 tablet (300 mg total) by mouth daily.  . CHANTIX STARTING MONTH PAK 0.5 MG X 11 & 1 MG X 42 tablet See admin instructions.  . DULoxetine (CYMBALTA) 30 MG capsule Take 3 capsules (90 mg total) by mouth daily.  . eszopiclone (LUNESTA) 1 MG TABS tablet Take 1 tablet (1 mg total) by mouth at bedtime. Take immediately before bedtime  . fluticasone (FLONASE) 50 MCG/ACT nasal spray Place 2 sprays into both nostrils daily. Max 2  sprays  . gabapentin (NEURONTIN) 600 MG tablet Take 1,200 mg by mouth 3 (three) times daily.  Marland Kitchen  hydrOXYzine (VISTARIL) 25 MG capsule Take 1 capsule (25 mg total) by mouth every 8 (eight) hours as needed for anxiety.  Marland Kitchen ipratropium (ATROVENT) 0.06 % nasal spray Place 2 sprays into both nostrils 3 (three) times daily. prn  . letrozole (FEMARA) 2.5 MG tablet Take 1 tablet (2.5 mg total) by mouth daily.  . montelukast (SINGULAIR) 10 MG tablet Take 1 tablet (10 mg total) by mouth at bedtime.  . Multiple Vitamins-Minerals (MULTIVITAMIN) tablet Take 1 tablet by mouth daily.  Marland Kitchen omeprazole (PRILOSEC) 20 MG capsule   . oxymorphone (OPANA) 10 MG tablet Take 10 mg by mouth 2 (two) times daily.   Marland Kitchen senna-docusate (SENOKOT-S) 8.6-50 MG tablet Take 1-2 tablets by mouth daily as needed for mild constipation.  . [DISCONTINUED] hydrochlorothiazide (HYDRODIURIL) 12.5 MG tablet Take 1 tablet (12.5 mg total) by mouth daily. In am  . [DISCONTINUED] telmisartan (MICARDIS) 80 MG tablet Take 1 tablet (80 mg total) by mouth daily. At night   Allergies  Allergen Reactions  . Norvasc [Amlodipine]     Swelling   . Sulfa Antibiotics Itching and Rash  . Sulfonamide Derivatives Itching and Rash   Recent Results (from the past 2160 hour(s))  T3, free     Status: None   Collection Time: 04/03/19  9:38 AM  Result Value Ref Range   T3, Free 3.4 2.3 - 4.2 pg/mL  Thyroid peroxidase antibody     Status: None   Collection Time: 04/03/19  9:38 AM  Result Value Ref Range   Thyroperoxidase Ab SerPl-aCnc <1 <9 IU/mL  T4, free     Status: None   Collection Time: 04/03/19  9:38 AM  Result Value Ref Range   Free T4 0.88 0.60 - 1.60 ng/dL    Comment: Specimens from patients who are undergoing biotin therapy and /or ingesting biotin supplements may contain high levels of biotin.  The higher biotin concentration in these specimens interferes with this Free T4 assay.  Specimens that contain high levels  of biotin may cause false  high results for this Free T4 assay.  Please interpret results in light of the total clinical presentation of the patient.    TSH     Status: None   Collection Time: 04/03/19  9:38 AM  Result Value Ref Range   TSH 2.91 0.35 - 4.50 uIU/mL  Gamma GT     Status: None   Collection Time: 04/03/19  9:38 AM  Result Value Ref Range   GGT 34 7 - 51 U/L  Comprehensive metabolic panel     Status: Abnormal   Collection Time: 04/03/19  9:38 AM  Result Value Ref Range   Sodium 133 (L) 135 - 145 mEq/L   Potassium 4.2 3.5 - 5.1 mEq/L   Chloride 94 (L) 96 - 112 mEq/L   CO2 31 19 - 32 mEq/L   Glucose, Bld 110 (H) 70 - 99 mg/dL   BUN 6 6 - 23 mg/dL   Creatinine, Ser 0.50 0.40 - 1.20 mg/dL   Total Bilirubin 0.5 0.2 - 1.2 mg/dL   Alkaline Phosphatase 115 39 - 117 U/L   AST 18 0 - 37 U/L   ALT 13 0 - 35 U/L   Total Protein 7.4 6.0 - 8.3 g/dL   Albumin 4.1 3.5 - 5.2 g/dL   GFR 124.69 >60.00 mL/min   Calcium 10.1 8.4 - 10.5 mg/dL   Objective  Body mass index is 21.68 kg/m. Wt Readings from Last 3 Encounters:  06/12/19 127 lb (57.6  kg)  12/31/18 122 lb (55.3 kg)  03/26/18 125 lb 9.6 oz (57 kg)   Temp Readings from Last 3 Encounters:  06/12/19 97.8 F (36.6 C) (Temporal)  03/26/18 98.3 F (36.8 C) (Oral)  01/22/18 97.8 F (36.6 C) (Oral)   BP Readings from Last 3 Encounters:  06/12/19 140/82  03/26/18 (!) 144/72  01/22/18 (!) 142/60   Pulse Readings from Last 3 Encounters:  06/12/19 78  03/26/18 89  01/22/18 77    Physical Exam Vitals and nursing note reviewed. Exam conducted with a chaperone present.  Constitutional:      Appearance: Normal appearance. She is well-developed and well-groomed.  HENT:     Head: Normocephalic and atraumatic.  Eyes:     Conjunctiva/sclera: Conjunctivae normal.     Pupils: Pupils are equal, round, and reactive to light.  Cardiovascular:     Rate and Rhythm: Normal rate and regular rhythm.     Pulses: Normal pulses.     Heart sounds: Normal heart  sounds. No murmur.  Pulmonary:     Effort: Pulmonary effort is normal.     Breath sounds: Normal breath sounds.  Chest:     Chest wall: No mass.     Breasts: Breasts are symmetrical.        Right: Normal. No swelling, bleeding, inverted nipple, mass, nipple discharge, skin change or tenderness.        Left: Normal. No swelling, bleeding, inverted nipple, mass, nipple discharge, skin change or tenderness.    Abdominal:     General: Abdomen is flat. Bowel sounds are normal.     Tenderness: There is no abdominal tenderness.  Genitourinary:    Pubic Area: No rash.      Labia:        Right: No rash, tenderness, lesion or injury.        Left: No rash, tenderness, lesion or injury.      Vagina: Normal.     Cervix: Normal.     Uterus: Normal.      Adnexa: Right adnexa normal and left adnexa normal.     Comments: Cervical os stenosis  Lymphadenopathy:     Upper Body:     Right upper body: No axillary adenopathy.     Left upper body: No axillary adenopathy.  Skin:    General: Skin is warm and dry.  Neurological:     General: No focal deficit present.     Mental Status: She is alert and oriented to person, place, and time. Mental status is at baseline.     Gait: Gait normal.  Psychiatric:        Attention and Perception: Attention and perception normal.        Mood and Affect: Mood and affect normal.        Speech: Speech normal.        Behavior: Behavior normal. Behavior is cooperative.        Thought Content: Thought content normal.        Cognition and Memory: Cognition and memory normal.        Judgment: Judgment normal.     Assessment  Plan  Essential hypertension - Plan: telmisartan-hydrochlorothiazide (MICARDIS HCT) 80-25 MG tablet in am, metoprolol succinate (TOPROL-XL) 25 MG 24 hr tablet q pm  Tobacco abuse with dependence - Plan: nicotine (NICODERM CQ - DOSED IN MG/24 HOURS) 21 mg/24hr patch, nicotine (NICODERM CQ - DOSED IN MG/24 HOURS) 14 mg/24hr patch, nicotine  (NICODERM CQ - DOSED IN MG/24 HR)  7 mg/24hr patch rec cessation  Anxiety and depression Chronic insomnia F/u outpatient Dr. Shea Evans  Cont meds of note stopped abilify 10 mg qd 06/11/19 due to increased appetite and weight gain  -->Pt needs new therapy [facility] OP psych   Dysphagia, unspecified type  Consider GI EGD pt wants to hold for now   HM Flu shot utd Tdap UTD pna 23utd prevnar will need age 38  covid shot 2/2 had  rec shingrix vaccine as wellfor future  Pap from [facility] -02/10/15 neg pap Pap today  Breast exam 06/12/19 normal   mammo with h/o left breast cancer 04/12/16 invasive ductal ca grade 1 0.8 cm  -h/o breast cancerleft  -letrozole likely causing sweating/hot flashes she will f/u with h/o in 08/2018 06/08/19 negative f/u [name] 09/07/19 10 am  Dr. Barry Dienes surgery due to f/u as of 06/12/19   dexa 05/01/17 osteopeniarec calcium and vitamin Drepeat in 3-5 years   Colonoscopy had 04/18/15 see reporttubular adenomas repeat in 5 years [facility]  Hep C neg 01/20/16  Consider HIV testing in future will disc with pt   rec smoking cessationCOPDat least 10 cig per day smoking as of 06/12/19 -anoro needs reapproval and prn albuterol   Saw Dr. Midge Minium chronic neck and back pain  Allergic rhinitis Ambulatory referral to ENTDr. Tami Ribas or [facility] to w/u hoarseness and allergies Hoarse - Plan: Ambulatory referral to ENTDr. Tami Ribas or [facility] further w/u Saw 08/28/18 Rx Astepro 0.1 nasal spray, omeprazole 20 mg qd, medrol 4 mg dose pak noted Reinke Edema and GERD cultures taken +fungal per pt s/p 2 rounds antibiotics I.e Doxycycline may need a 3rd and consider CT neck if persists f/u in 3 weeks   Dr. Pryor Ochoa ENTf/u prn seen week of 12/22/2018 and doing better as of today   Provider: Dr. Olivia Mackie McLean-Scocuzza-Internal Medicine

## 2019-06-12 NOTE — Patient Instructions (Addendum)
Telmisartan 80 mg daily/Micardis and  hctz hydrochlorothiazide 25 mg daily   Stop individuals and take combined pill in the am    Take metoprolol xl 25 mg at night   DASH Eating Plan DASH stands for "Dietary Approaches to Stop Hypertension." The DASH eating plan is a healthy eating plan that has been shown to reduce high blood pressure (hypertension). It may also reduce your risk for type 2 diabetes, heart disease, and stroke. The DASH eating plan may also help with weight loss. What are tips for following this plan?  General guidelines  Avoid eating more than 2,300 mg (milligrams) of salt (sodium) a day. If you have hypertension, you may need to reduce your sodium intake to 1,500 mg a day.  Limit alcohol intake to no more than 1 drink a day for nonpregnant women and 2 drinks a day for men. One drink equals 12 oz of beer, 5 oz of wine, or 1 oz of hard liquor.  Work with your health care provider to maintain a healthy body weight or to lose weight. Ask what an ideal weight is for you.  Get at least 30 minutes of exercise that causes your heart to beat faster (aerobic exercise) most days of the week. Activities may include walking, swimming, or biking.  Work with your health care provider or diet and nutrition specialist (dietitian) to adjust your eating plan to your individual calorie needs. Reading food labels   Check food labels for the amount of sodium per serving. Choose foods with less than 5 percent of the Daily Value of sodium. Generally, foods with less than 300 mg of sodium per serving fit into this eating plan.  To find whole grains, look for the word "whole" as the first word in the ingredient list. Shopping  Buy products labeled as "low-sodium" or "no salt added."  Buy fresh foods. Avoid canned foods and premade or frozen meals. Cooking  Avoid adding salt when cooking. Use salt-free seasonings or herbs instead of table salt or sea salt. Check with your health care  provider or pharmacist before using salt substitutes.  Do not fry foods. Cook foods using healthy methods such as baking, boiling, grilling, and broiling instead.  Cook with heart-healthy oils, such as olive, canola, soybean, or sunflower oil. Meal planning  Eat a balanced diet that includes: ? 5 or more servings of fruits and vegetables each day. At each meal, try to fill half of your plate with fruits and vegetables. ? Up to 6-8 servings of whole grains each day. ? Less than 6 oz of lean meat, poultry, or fish each day. A 3-oz serving of meat is about the same size as a deck of cards. One egg equals 1 oz. ? 2 servings of low-fat dairy each day. ? A serving of nuts, seeds, or beans 5 times each week. ? Heart-healthy fats. Healthy fats called Omega-3 fatty acids are found in foods such as flaxseeds and coldwater fish, like sardines, salmon, and mackerel.  Limit how much you eat of the following: ? Canned or prepackaged foods. ? Food that is high in trans fat, such as fried foods. ? Food that is high in saturated fat, such as fatty meat. ? Sweets, desserts, sugary drinks, and other foods with added sugar. ? Full-fat dairy products.  Do not salt foods before eating.  Try to eat at least 2 vegetarian meals each week.  Eat more home-cooked food and less restaurant, buffet, and fast food.  When eating at  a restaurant, ask that your food be prepared with less salt or no salt, if possible. What foods are recommended? The items listed may not be a complete list. Talk with your dietitian about what dietary choices are best for you. Grains Whole-grain or whole-wheat bread. Whole-grain or whole-wheat pasta. Brown rice. Modena Morrow. Bulgur. Whole-grain and low-sodium cereals. Pita bread. Low-fat, low-sodium crackers. Whole-wheat flour tortillas. Vegetables Fresh or frozen vegetables (raw, steamed, roasted, or grilled). Low-sodium or reduced-sodium tomato and vegetable juice. Low-sodium or  reduced-sodium tomato sauce and tomato paste. Low-sodium or reduced-sodium canned vegetables. Fruits All fresh, dried, or frozen fruit. Canned fruit in natural juice (without added sugar). Meat and other protein foods Skinless chicken or [place]. Ground chicken or [place]. Pork with fat trimmed off. Fish and seafood. Egg whites. Dried beans, peas, or lentils. Unsalted nuts, nut butters, and seeds. Unsalted canned beans. Lean cuts of beef with fat trimmed off. Low-sodium, lean deli meat. Dairy Low-fat (1%) or fat-free (skim) milk. Fat-free, low-fat, or reduced-fat cheeses. Nonfat, low-sodium ricotta or cottage cheese. Low-fat or nonfat yogurt. Low-fat, low-sodium cheese. Fats and oils Soft margarine without trans fats. Vegetable oil. Low-fat, reduced-fat, or light mayonnaise and salad dressings (reduced-sodium). Canola, safflower, olive, soybean, and sunflower oils. Avocado. Seasoning and other foods Herbs. Spices. Seasoning mixes without salt. Unsalted popcorn and pretzels. Fat-free sweets. What foods are not recommended? The items listed may not be a complete list. Talk with your dietitian about what dietary choices are best for you. Grains Baked goods made with fat, such as croissants, muffins, or some breads. Dry pasta or rice meal packs. Vegetables Creamed or fried vegetables. Vegetables in a cheese sauce. Regular canned vegetables (not low-sodium or reduced-sodium). Regular canned tomato sauce and paste (not low-sodium or reduced-sodium). Regular tomato and vegetable juice (not low-sodium or reduced-sodium). Angie Fava. Olives. Fruits Canned fruit in a light or heavy syrup. Fried fruit. Fruit in cream or butter sauce. Meat and other protein foods Fatty cuts of meat. Ribs. Fried meat. Berniece Salines. Sausage. Bologna and other processed lunch meats. Salami. Fatback. Hotdogs. Bratwurst. Salted nuts and seeds. Canned beans with added salt. Canned or smoked fish. Whole eggs or egg yolks. Chicken or [place]  with skin. Dairy Whole or 2% milk, cream, and half-and-half. Whole or full-fat cream cheese. Whole-fat or sweetened yogurt. Full-fat cheese. Nondairy creamers. Whipped toppings. Processed cheese and cheese spreads. Fats and oils Butter. Stick margarine. Lard. Shortening. Ghee. Bacon fat. Tropical oils, such as coconut, palm kernel, or palm oil. Seasoning and other foods Salted popcorn and pretzels. Onion salt, garlic salt, seasoned salt, table salt, and sea salt. Worcestershire sauce. Tartar sauce. Barbecue sauce. Teriyaki sauce. Soy sauce, including reduced-sodium. Steak sauce. Canned and packaged gravies. Fish sauce. Oyster sauce. Cocktail sauce. Horseradish that you find on the shelf. Ketchup. Mustard. Meat flavorings and tenderizers. Bouillon cubes. Hot sauce and Tabasco sauce. Premade or packaged marinades. Premade or packaged taco seasonings. Relishes. Regular salad dressings. Where to find more information:  National Heart, Lung, and [place]: https://wilson-eaton.com/  American Heart Association: www.heart.org Summary  The DASH eating plan is a healthy eating plan that has been shown to reduce high blood pressure (hypertension). It may also reduce your risk for type 2 diabetes, heart disease, and stroke.  With the DASH eating plan, you should limit salt (sodium) intake to 2,300 mg a day. If you have hypertension, you may need to reduce your sodium intake to 1,500 mg a day.  When on the DASH eating plan, aim to eat  more fresh fruits and vegetables, whole grains, lean proteins, low-fat dairy, and heart-healthy fats.  Work with your health care provider or diet and nutrition specialist (dietitian) to adjust your eating plan to your individual calorie needs. This information is not intended to replace advice given to you by your health care provider. Make sure you discuss any questions you have with your health care provider. Document Revised: 01/04/2017 Document Reviewed:  01/16/2016 Elsevier Patient Education  2020 [employer].

## 2019-06-15 ENCOUNTER — Other Ambulatory Visit: Payer: Self-pay | Admitting: Psychiatry

## 2019-06-15 ENCOUNTER — Encounter: Payer: Self-pay | Admitting: Internal Medicine

## 2019-06-15 DIAGNOSIS — F331 Major depressive disorder, recurrent, moderate: Secondary | ICD-10-CM

## 2019-06-15 NOTE — Telephone Encounter (Signed)
   [facility] 06/15/2019   Name: Batool Reasonover   MRN: [id number]   DOB: 1955/05/07   AGE: 64 y.o.   GENDER: female   PCP McLean-Scocuzza, Nino Glow, MD.   Ms. Hartvigsen returned call to Care Guide. Care Guide help Ms. Cinque complete the Medicare Extra Help application. Informed patient that the application summary will be mailed (sent summary page to [facility] Guy Sandifer to be printed out and mailed)  to her and that she will receive a letter from the [place] once her application has been processed. Patient stated understanding. Patient does not have any additional needs at this time.   Hi Denisa,   When you get a chance can you please print and mail the letter and summary page attached for Ms. Alison Stalling?  Thanks,   [place], Care Management Phone: [phone] Email: [email or web address]     Closing referral pending any other needs of patient.    [place], Care Management Phone: [phone] Email: [email or web address]

## 2019-06-15 NOTE — Telephone Encounter (Signed)
° °  [facility] 06/15/2019 2nd Attempt    Name: Ashley Cross    MRN: [id number]    DOB: 07-29-1955    AGE: 64 y.o.    GENDER: female    PCP McLean-Scocuzza, Nino Glow, MD.    Second attempt to call patient regarding assistance with Medicare Extra Help. Left message for patient to give Care Guide a call back     [place], Care Management Phone: [phone] Email: [email or web address]

## 2019-06-16 LAB — CYTOLOGY - PAP
Adequacy: ABSENT
Comment: NEGATIVE
Diagnosis: NEGATIVE
High risk HPV: NEGATIVE

## 2019-06-22 ENCOUNTER — Telehealth (INDEPENDENT_AMBULATORY_CARE_PROVIDER_SITE_OTHER): Payer: PPO | Admitting: Psychiatry

## 2019-06-22 ENCOUNTER — Encounter: Payer: Self-pay | Admitting: Psychiatry

## 2019-06-22 ENCOUNTER — Other Ambulatory Visit: Payer: Self-pay

## 2019-06-22 DIAGNOSIS — F331 Major depressive disorder, recurrent, moderate: Secondary | ICD-10-CM | POA: Diagnosis not present

## 2019-06-22 DIAGNOSIS — F17200 Nicotine dependence, unspecified, uncomplicated: Secondary | ICD-10-CM

## 2019-06-22 DIAGNOSIS — F1010 Alcohol abuse, uncomplicated: Secondary | ICD-10-CM | POA: Diagnosis not present

## 2019-06-22 DIAGNOSIS — F17210 Nicotine dependence, cigarettes, uncomplicated: Secondary | ICD-10-CM

## 2019-06-22 MED ORDER — REXULTI 0.5 MG PO TABS: 0.5000 mg | ORAL_TABLET | Freq: Every day | ORAL | 1 refills | Status: DC

## 2019-06-22 MED ORDER — DULOXETINE HCL 30 MG PO CPEP: 90.0000 mg | ORAL_CAPSULE | Freq: Every day | ORAL | 0 refills | Status: DC

## 2019-06-22 NOTE — Progress Notes (Signed)
Provider Location : [facility] Patient Location : Home  Virtual Visit via Video Note  I connected with Ashley Cross on 06/22/19 at  4:00 PM EDT by a video enabled telemedicine application and verified that I am speaking with the correct person using two identifiers.   I discussed the limitations of evaluation and management by telemedicine and the availability of in person appointments. The patient expressed understanding and agreed to proceed.     I discussed the assessment and treatment plan with the patient. The patient was provided an opportunity to ask questions and all were answered. The patient agreed with the plan and demonstrated an understanding of the instructions.   The patient was advised to call back or seek an in-person evaluation if the symptoms worsen or if the condition fails to improve as anticipated.  [place] MD OP Progress Note  06/22/2019 5:32 PM Ashley Cross  MRN:  [id number]  Chief Complaint:  Chief Complaint    Follow-up     HPI: Ashley Cross is a 64 year old Caucasian female on disability, lives in [place], has a history of MDD, alcohol use disorder, history of breast cancer, chronic pain, hypertension was evaluated by telemedicine today.  Patient today reports she is currently struggling with depressive symptoms.  She reports she continues to have good days and bad days.  The past few weeks she has been struggling with racing thoughts about her financial stressors.  She also reports she misses her daughter who passed away from an overdose.  Patient reports she however stopped her Abilify since she was craving food like cheese cakes as well as ice creams.  Patient reports however she is interested in medication readjustment today.  She reports sleep was good on Lunesta however the past 1 week since she was thinking a lot about her finances sleep has been restless.  She is interested in restarting psychotherapy sessions.  She is aware that Ms.  [name] her therapist is no longer at the practice.  She will call the clinic to schedule appointment with a new therapist.  Patient denies any suicidality, homicidality or perceptual disturbances.  She continues to limit the use of alcohol.  She reports she is trying to make use of her social support system.  Her son continues to be supportive and has been encouraging her to start yoga which has helped her in the past.  She is cutting back on smoking cigarettes.  Patient denies any other concerns today.  Visit Diagnosis:    ICD-10-CM   1. MDD (major depressive disorder), recurrent episode, moderate (HCC)  F33.1 DULoxetine (CYMBALTA) 30 MG capsule    Brexpiprazole (REXULTI) 0.5 MG TABS  2. Alcohol use disorder, mild, abuse  F10.10   3. Tobacco use disorder  F17.200     Past Psychiatric History: I have reviewed past psychiatric history from my progress note on 09/16/2018.  Past trials of Zoloft, Wellbutrin, Lexapro, Celexa.  Past Medical History:  Past Medical History:  Diagnosis Date  . Anxiety   . Breast cancer ([place])    Left breast(nipple mass) 2018  . Cervical stenosis of spine   . Chicken pox   . Chronic back pain   . Colon polyps   . COPD (chronic obstructive pulmonary disease) ([place])   . Cyst of left nipple   . Depression   . Hypertension   . Insomnia   . Personal history of radiation therapy     Past Surgical History:  Procedure Laterality Date  . BACK  SURGERY     x 3 (05-19-08 x 2; 2011/05/20 x 1)   . BREAST BIOPSY Left    core- neg  . BREAST LUMPECTOMY Left 05/19/16  . BREAST SURGERY Left    breast biopsy  . CERVICAL CONE BIOPSY    . CERVICAL FUSION     times 2  . COLONOSCOPY WITH PROPOFOL N/A 04/18/2015   Procedure: COLONOSCOPY WITH PROPOFOL;  Surgeon: [name], MD;  Location[facility] END[facility]COPY;  Service: Endoscopy;  Laterality: N/A;  . ELBOW SURGERY     May 19, 2012 b/l elbows per pt ulnar nerve   . ELBOW SURGERY     x2   . EPIDURAL BLOCK INJECTION     Dr.  Maryjean Ka   . MASS EXCISION Left 04/12/2016   Procedure: EXCISION OF LEFT NIPPLE MASS;  Surgeon: Stark Klein, MD;  Location: [facility];  Service: General;  Laterality: Left;  . NECK SURGERY    . POSTERIOR CERVICAL FUSION/FORAMINOTOMY  03/07/2011   Procedure: POSTERIOR CERVICAL FUSION/FORAMINOTOMY LEVEL 4;  Surgeon: Eustace Moore, MD;  Location: [facility];  Service: Neurosurgery;  Laterality: N/A;  cervical three to thoracic one posterior cervical fusion   . SENTINEL NODE BIOPSY Left 05/29/2016   Procedure: LEFT SENTINEL LYMPH NODE BIOPSY;  Surgeon: Stark Klein, MD;  Location: [facility];  Service: General;  Laterality: Left;  . TONSILLECTOMY      Family Psychiatric History: I have reviewed family psychiatric history from my progress note on 09/16/2018  Family History:  Family History  Problem Relation Age of Onset  . Breast cancer Mother 38  . Hypertension Mother   . Cancer Mother        breast dx'ed 82   . Cancer Father        prostate  . Hypertension Father   . Drug abuse Daughter   . Hypertension Maternal Grandmother   . Breast cancer Cousin     Social History: I have reviewed social history from my progress note on 09/16/2018 Social History   Socioeconomic History  . Marital status: Divorced    Spouse name: Not on file  . Number of children: 2  . Years of education: Not on file  . Highest education level: Not on file  Occupational History  . Not on file  Tobacco Use  . Smoking status: Current Every Day Smoker    Packs/day: 0.75    Years: 30.00    Pack years: 22.50    Types: Cigarettes  . Smokeless tobacco: Never Used  . Tobacco comment: currently 5 cigarettes per day reported 06/22/2019  Substance and Sexual Activity  . Alcohol use: Yes    Comment: social  . Drug use: No    Comment: on opana since jan 2018  . Sexual activity: Not on file  Other Topics Concern  . Not on file  Social History Narrative   As of 01/25/17 not sexually active of in  relationship    Divorced   2 kids son and daughter (though daughter died of suicide in 2017-05-19)   Son lives in [place] now as of 12/2018       Disability    Loves animals has 3 dogs           Social Determinants of Radio broadcast assistant Strain: High Risk  . Difficulty of Paying Living Expenses: Hard  Food Insecurity:   . Worried About Charity fundraiser in the Last Year:   . [employer] of  Food in the Last Year:   Transportation Needs:   . Film/video editor (Medical):   Marland Kitchen Lack of Transportation (Non-Medical):   Physical Activity:   . Days of Exercise per Week:   . Minutes of Exercise per Session:   Stress:   . Feeling of Stress :   Social Connections:   . Frequency of Communication with Friends and Family:   . Frequency of Social Gatherings with Friends and Family:   . Attends Religious Services:   . Active Member of Clubs or Organizations:   . Attends Archivist Meetings:   Marland Kitchen Marital Status:     Allergies:  Allergies  Allergen Reactions  . Norvasc [Amlodipine]     Swelling   . Sulfa Antibiotics Itching and Rash  . Sulfonamide Derivatives Itching and Rash    Metabolic Disorder Labs: Lab Results  Component Value Date   HGBA1C 5.1 02/27/2019   No results found for: PROLACTIN Lab Results  Component Value Date   CHOL 219 (H) 02/27/2019   TRIG 54.0 02/27/2019   HDL 133.80 02/27/2019   CHOLHDL 2 02/27/2019   VLDL 10.8 02/27/2019   LDLCALC 74 02/27/2019   LDLCALC 81 02/13/2018   Lab Results  Component Value Date   TSH 2.91 04/03/2019   TSH 7.36 (H) 02/27/2019    Therapeutic Level Labs: No results found for: LITHIUM No results found for: VALPROATE No components found for:  CBMZ  Current Medications: Current Outpatient Medications  Medication Sig Dispense Refill  . albuterol (VENTOLIN HFA) 108 (90 Base) MCG/ACT inhaler Inhale 1-2 puffs into the lungs every 4 (four) hours as needed for wheezing or shortness of breath. 18 g 12  .  azelastine (ASTELIN) 0.1 % nasal spray     . Brexpiprazole (REXULTI) 0.5 MG TABS Take 1-2 tablets (0.5-1 mg total) by mouth at bedtime. Start taking 1 tablet for 7 days and then increase to 2 tablets after that 53 tablet 1  . buPROPion (WELLBUTRIN XL) 300 MG 24 hr tablet Take 1 tablet (300 mg total) by mouth daily. 90 tablet 3  . CHANTIX STARTING MONTH PAK 0.5 MG X 11 & 1 MG X 42 tablet See admin instructions.    . DULoxetine (CYMBALTA) 30 MG capsule Take 3 capsules (90 mg total) by mouth daily. 270 capsule 0  . eszopiclone (LUNESTA) 1 MG TABS tablet TAKE ONE TABLET BY MOUTH IMMEDIATELY BEFORE BEDTIME. 30 tablet 1  . fluticasone (FLONASE) 50 MCG/ACT nasal spray Place 2 sprays into both nostrils daily. Max 2 sprays 16 g 11  . gabapentin (NEURONTIN) 600 MG tablet Take 1,200 mg by mouth 3 (three) times daily.    . hydrOXYzine (VISTARIL) 25 MG capsule Take 1 capsule (25 mg total) by mouth every 8 (eight) hours as needed for anxiety. 90 capsule 1  . ipratropium (ATROVENT) 0.06 % nasal spray Place 2 sprays into both nostrils 3 (three) times daily. prn 15 mL 12  . letrozole (FEMARA) 2.5 MG tablet Take 1 tablet (2.5 mg total) by mouth daily. 90 tablet 3  . metoprolol succinate (TOPROL-XL) 25 MG 24 hr tablet Take 1 tablet (25 mg total) by mouth daily. 90 tablet 3  . montelukast (SINGULAIR) 10 MG tablet Take 1 tablet (10 mg total) by mouth at bedtime. 90 tablet 3  . Multiple Vitamins-Minerals (MULTIVITAMIN) tablet Take 1 tablet by mouth daily. 90 tablet 3  . nicotine (NICODERM CQ - DOSED IN MG/24 HOURS) 14 mg/24hr patch Place 1 patch (14 mg total) onto  the skin daily. 60 patch 0  . nicotine (NICODERM CQ - DOSED IN MG/24 HOURS) 21 mg/24hr patch Place 1 patch (21 mg total) onto the skin daily. X 2 months 14 mg x 2 months, 7 mg x 2 months 60 patch 0  . nicotine (NICODERM CQ - DOSED IN MG/24 HR) 7 mg/24hr patch Place 1 patch (7 mg total) onto the skin daily. 60 patch 0  . omeprazole (PRILOSEC) 20 MG capsule      . oxymorphone (OPANA) 10 MG tablet Take 10 mg by mouth 2 (two) times daily.     Marland Kitchen senna-docusate (SENOKOT-S) 8.6-50 MG tablet Take 1-2 tablets by mouth daily as needed for mild constipation. 180 tablet 3  . telmisartan-hydrochlorothiazide (MICARDIS HCT) 80-25 MG tablet Take 1 tablet by mouth daily. 90 tablet 3   No current facility-administered medications for this visit.     Musculoskeletal: Strength & Muscle Tone: UTA Gait & Station: normal Patient leans: N/A  Psychiatric Specialty Exam: Review of Systems  Musculoskeletal: Positive for arthralgias and back pain.  Psychiatric/Behavioral: Positive for dysphoric mood and sleep disturbance.  All other systems reviewed and are negative.   There were no vitals taken for this visit.There is no height or weight on file to calculate BMI.  General Appearance: Casual  Eye Contact:  Fair  Speech:  Normal Rate  Volume:  Normal  Mood:  Depressed  Affect:  Congruent  Thought Process:  Goal Directed and Descriptions of Associations: Intact  Orientation:  Full (Time, Place, and Person)  Thought Content: Rumination   Suicidal Thoughts:  No  Homicidal Thoughts:  No  Memory:  Immediate;   Fair Recent;   Fair Remote;   Fair  Judgement:  Fair  Insight:  Fair  Psychomotor Activity:  Normal  Concentration:  Concentration: Fair and Attention Span: Fair  Recall:  [employer] of Knowledge: Fair  Language: Fair  Akathisia:  No  Handed:  Right  AIMS (if indicated): UTA  Assets:  Communication Skills Desire for Improvement Housing Social Support  ADL's:  Intact  Cognition: WNL  Sleep:  Restless   Screenings: GAD-7     Office Visit from 06/12/2019 in [facility] Visit from 09/30/2017 in [facility]  Total GAD-7 Score  18  18    PHQ2-9     Office Visit from 06/12/2019 in [facility] from 12/31/2018 in [facility] from 09/16/2018 in  [facility] Visit from 09/30/2017 in [facility] Office Visit from 08/19/2017 in [facility]  PHQ-2 Total Score  4  0  0  4  4  PHQ-9 Total Score  17  --  --  18  16       Assessment and Plan: Miha Schleisman is a 64 year old Caucasian female, divorced, disabled, has a history of depression, chronic pain, hypertension, history of breast cancer was evaluated by telemedicine today.  She is biologically predisposed given her family history as well as substance abuse problems.  Patient with psychosocial stressors of her own health issues, financial problems, death of her daughter a year and a half ago.  Patient has been noncompliant on the Abilify due to possible side effects.  However she is interested in medication readjustment restarting psychotherapy sessions.  Plan as noted below.  Plan MDD-improving Cymbalta 90 mg p.o. daily Wellbutrin reduced dose to 300 mg p.o. daily Start Rexulti 0.5 mg p.o.  nightly for 1 week and increase to 1 mg p.o. nightly after that Lunesta 1 mg p.o. nightly for sleep Hydroxyzine 25 mg p.o. daily as needed for severe anxiety symptoms.  Alcohol use disorder mild-improving She will continue to cut back.  She has been limiting use  Tobacco use disorder-improving Provided smoking cessation counseling.  Patient to call the clinic to schedule appointment with a new therapist.  Follow-up in clinic in 3 to 4 weeks or sooner if needed.  I have spent atleast 20 minutes non face to face with patient today. More than 50 % of the time was spent for preparing to see the patient ( e.g., review of test, records ),ordering medications and test ,psychoeducation and supportive psychotherapy and care coordination,as well as documenting clinical information in electronic health record. This note was generated in part or whole with voice recognition software. Voice recognition is usually quite accurate but  there are transcription errors that can and very often do occur. I apologize for any typographical errors that were not detected and corrected.      Ursula Alert, MD 06/22/2019, 5:32 PM

## 2019-06-23 ENCOUNTER — Telehealth: Payer: Self-pay | Admitting: Psychiatry

## 2019-06-23 ENCOUNTER — Telehealth: Payer: Self-pay

## 2019-06-23 DIAGNOSIS — F331 Major depressive disorder, recurrent, moderate: Secondary | ICD-10-CM

## 2019-06-23 MED ORDER — ZIPRASIDONE HCL 20 MG PO CAPS: 20.0000 mg | ORAL_CAPSULE | Freq: Every day | ORAL | 1 refills | Status: DC

## 2019-06-23 NOTE — Telephone Encounter (Signed)
pt called states that the rexulti will cost $300 and that she went back on the abilify but she needs refills.

## 2019-06-23 NOTE — Telephone Encounter (Signed)
Patient returned writer's call.  Discussed with patient that she can be started on Geodon since [place] is too expensive.  She agrees with plan.  I have sent Geodon 20 mg to her pharmacy.  Advised patient to get EKG to monitor QTC.

## 2019-06-23 NOTE — Telephone Encounter (Signed)
Attempted to contact patient, left message.  Left message that she does not have to go back on Abilify and that there are other options that we can try.  Patient advised to call writer back if interested.

## 2019-06-30 ENCOUNTER — Telehealth: Payer: Self-pay | Admitting: Internal Medicine

## 2019-06-30 NOTE — Telephone Encounter (Signed)
Please advise 

## 2019-06-30 NOTE — Telephone Encounter (Signed)
Pt states that her blood pressure meds are making her retain fluid and making her miserable. She states that she is very constipated as well. Please advise

## 2019-07-01 NOTE — Telephone Encounter (Signed)
What has her blood pressure been?   She is limited on options already on telmisartan hctz which has a fluid pill in it and is max dose  We could change her to telmisartan 80 mg alone and lasix 40 mg daily   If on metoprolol have her take this at night  Constipation rec miralax powder otc daily until has a bowel movement or senna-colace over the counter as needed for constipation   If we make changes above she will need BP check nurse visit   Thanks  [place]

## 2019-07-02 ENCOUNTER — Other Ambulatory Visit: Payer: Self-pay | Admitting: Internal Medicine

## 2019-07-02 ENCOUNTER — Ambulatory Visit (INDEPENDENT_AMBULATORY_CARE_PROVIDER_SITE_OTHER): Payer: PPO | Admitting: Pharmacist

## 2019-07-02 DIAGNOSIS — J449 Chronic obstructive pulmonary disease, unspecified: Secondary | ICD-10-CM | POA: Diagnosis not present

## 2019-07-02 DIAGNOSIS — Z720 Tobacco use: Secondary | ICD-10-CM

## 2019-07-02 DIAGNOSIS — I10 Essential (primary) hypertension: Secondary | ICD-10-CM

## 2019-07-02 MED ORDER — TELMISARTAN 80 MG PO TABS: 80.0000 mg | ORAL_TABLET | Freq: Every day | ORAL | 3 refills | Status: DC

## 2019-07-02 MED ORDER — STIOLTO RESPIMAT 2.5-2.5 MCG/ACT IN AERS: 2.0000 | INHALATION_SPRAY | Freq: Every day | RESPIRATORY_TRACT | 11 refills | Status: DC

## 2019-07-02 MED ORDER — FUROSEMIDE 40 MG PO TABS: 40.0000 mg | ORAL_TABLET | Freq: Every day | ORAL | 1 refills | Status: DC

## 2019-07-02 NOTE — Patient Instructions (Signed)
Visit Information  Goals Addressed            This Visit's Progress     Patient Stated   . "I want to be healthy" (pt-stated)       CARE PLAN ENTRY (see longtitudinal plan of care for additional care plan information)  Current Barriers:  . Polypharmacy; complex patient with multiple comorbidities including chronic pain, depression, hx breast cancer, tobacco use disorder, alcohol abuse, HTN . Reports continued concerns with finances. Contacted the Credit Counseling resources that were provided to her by Care Guide/LCSW, but notes that she was unable to afford any of their solutions. Does not think that she could hold down a job with her current emotional and chronic pain o Major depression: exacerbated by death of her daughter, [name]. Follows w/ Dr. Shea Evans; duloxetine 90 mg daily (30 mg x3) bupropion XL 300 mg daily, ziprasidone 20 mg daily o Insomina: Lunesta 1 mg QPM, PRN hydroxyzine; notes she is sleeping really well right now o Chronic pain: Opana 10 mg BID; gabapentin 1200 mg TID (6 600 mg tabs daily) o HTN: telmisartan 80 mg QAM, furosemide 40 mg daily PRN swelling o Hx breast cancer: letrozole 2.5 mg daily o Tobacco abuse/COPD: Anoro daily (notes that she doesn't have Anoro because the cost went back up to $45/month and she cannot afford this) albuterol HFA 3-4 times weekly; loratadine 10 mg daily + montelukast 10 mg daily, fluticasone and azelastine nasal sprays daily - Chantix 1 mg BID. Notes that she has switched to vaping, and knows that this is not necessarily a safer long term solution. She and PCP discussed adding nicotine patches, but these are too expensive for her right now  Pharmacist Clinical Goal(s):  Marland Kitchen Over the next 90 days, patient will work with PharmD and provider towards optimized medication management  Interventions: . Comprehensive medication review performed, medication list updated in electronic medical record . Inter-disciplinary care team collaboration (see  longitudinal plan of care) . Extra Help application completed ~ 4 weeks ago. Per [employer], patient is now LIS 4, which is only partial extra help. She has not spent the $600 out of pocket to qualify for Anoro patient assistance, however, she will qualify for [employer] assistance through [employer]. Will collaborate w/ PCP to send this prescription to the pharmacy, if amenable. We will need to demonstrate proof of her copay to access patient assistance, so the order must go to the pharmacy. Will collaborate w/ CPhT to mail patient portion of the Stiolto application to her. Will collaborate w/ PCP for provider portion of application.  . Discussed that [place] [employer] can provide a supply of nicotine patches for free. Patient will call [place] [employer] to investigate this. If this option is not available for her long term, we can pursue coverage w/ GoodRx at [employer], as cost is ~$24/box with GoodRx card.   Patient Self Care Activities:  . Patient will take medications as prescribed  Please see past updates related to this goal by clicking on the "Past Updates" button in the selected goal         Patient verbalizes understanding of instructions provided today.   Plan:  - Will collaborate w/ patient, provider, and CPhT as above  [name], PharmD, [place], [place] [phone]

## 2019-07-02 NOTE — Telephone Encounter (Signed)
Changed meds  Ashley Cross

## 2019-07-02 NOTE — Telephone Encounter (Signed)
Spoke with the Patient. She is taking the metoprolol at night and is willing to switch to the Telmisartan 80 with 40 mg lasix separate. Bp check scheduled for 6/10 at 11:30 am.   Patient will try the Miralax otc as the Senna has not been helping.   Patient was needing to speak with pharmacist [name] and states she will call her back as they have missed each other's calls today.

## 2019-07-02 NOTE — Chronic Care Management (AMB) (Signed)
Chronic Care Management   Follow Up Note   07/02/2019 Name: Ikeia Desmarais MRN: [id number] DOB: 1955/06/05  Referred by: McLean-Scocuzza, Nino Glow, MD Reason for referral : Chronic Care Management (Medication Management)   Coleman Everroad is a 64 y.o. year old female who is a primary care patient of McLean-Scocuzza, Nino Glow, MD. The CCM team was consulted for assistance with chronic disease management and care coordination needs.  Contacted patient for medication management review.     Review of patient status, including review of consultants reports, relevant laboratory and other test results, and collaboration with appropriate care team members and the patient's provider was performed as part of comprehensive patient evaluation and provision of chronic care management services.    SDOH (Social Determinants of Health) assessments performed: Yes See Care Plan activities for detailed interventions related to SDOH)  SDOH Interventions     Most Recent Value  SDOH Interventions  SDOH Interventions for the Following Domains  Financial Strain, Tobacco  Financial Strain Interventions  Other (Comment) [patient assistance application]  Tobacco Interventions  Cessation Materials Given and Reviewed       Outpatient Encounter Medications as of 07/02/2019  Medication Sig  . CHANTIX STARTING MONTH PAK 0.5 MG X 11 & 1 MG X 42 tablet See admin instructions.  . ziprasidone (GEODON) 20 MG capsule Take 1 capsule (20 mg total) by mouth daily with supper.  Marland Kitchen albuterol (VENTOLIN HFA) 108 (90 Base) MCG/ACT inhaler Inhale 1-2 puffs into the lungs every 4 (four) hours as needed for wheezing or shortness of breath.  Marland Kitchen azelastine (ASTELIN) 0.1 % nasal spray   . buPROPion (WELLBUTRIN XL) 300 MG 24 hr tablet Take 1 tablet (300 mg total) by mouth daily.  . DULoxetine (CYMBALTA) 30 MG capsule Take 3 capsules (90 mg total) by mouth daily.  . eszopiclone (LUNESTA) 1 MG TABS tablet TAKE ONE TABLET BY MOUTH  IMMEDIATELY BEFORE BEDTIME.  . fluticasone (FLONASE) 50 MCG/ACT nasal spray Place 2 sprays into both nostrils daily. Max 2 sprays  . gabapentin (NEURONTIN) 600 MG tablet Take 1,200 mg by mouth 3 (three) times daily.  . hydrOXYzine (VISTARIL) 25 MG capsule Take 1 capsule (25 mg total) by mouth every 8 (eight) hours as needed for anxiety.  Marland Kitchen ipratropium (ATROVENT) 0.06 % nasal spray Place 2 sprays into both nostrils 3 (three) times daily. prn  . letrozole (FEMARA) 2.5 MG tablet Take 1 tablet (2.5 mg total) by mouth daily.  . metoprolol succinate (TOPROL-XL) 25 MG 24 hr tablet Take 1 tablet (25 mg total) by mouth daily.  . montelukast (SINGULAIR) 10 MG tablet Take 1 tablet (10 mg total) by mouth at bedtime.  . Multiple Vitamins-Minerals (MULTIVITAMIN) tablet Take 1 tablet by mouth daily.  . nicotine (NICODERM CQ - DOSED IN MG/24 HOURS) 14 mg/24hr patch Place 1 patch (14 mg total) onto the skin daily.  . nicotine (NICODERM CQ - DOSED IN MG/24 HOURS) 21 mg/24hr patch Place 1 patch (21 mg total) onto the skin daily. X 2 months 14 mg x 2 months, 7 mg x 2 months  . nicotine (NICODERM CQ - DOSED IN MG/24 HR) 7 mg/24hr patch Place 1 patch (7 mg total) onto the skin daily.  Marland Kitchen omeprazole (PRILOSEC) 20 MG capsule   . oxymorphone (OPANA) 10 MG tablet Take 10 mg by mouth 2 (two) times daily.   Marland Kitchen senna-docusate (SENOKOT-S) 8.6-50 MG tablet Take 1-2 tablets by mouth daily as needed for mild constipation.  Marland Kitchen telmisartan-hydrochlorothiazide (MICARDIS HCT)  80-25 MG tablet Take 1 tablet by mouth daily.   No facility-administered encounter medications on file as of 07/02/2019.     Objective:   Goals Addressed            This Visit's Progress     Patient Stated   . "I want to be healthy" (pt-stated)       CARE PLAN ENTRY (see longtitudinal plan of care for additional care plan information)  Current Barriers:  . Polypharmacy; complex patient with multiple comorbidities including chronic pain, depression,  hx breast cancer, tobacco use disorder, alcohol abuse, HTN . Reports continued concerns with finances. Contacted the Credit Counseling resources that were provided to her by Care Guide/LCSW, but notes that she was unable to afford any of their solutions. Does not think that she could hold down a job with her current emotional and chronic pain o Major depression: exacerbated by death of her daughter, [name]. Follows w/ Dr. Shea Evans; duloxetine 90 mg daily (30 mg x3) bupropion XL 300 mg daily, ziprasidone 20 mg daily o Insomina: Lunesta 1 mg QPM, PRN hydroxyzine; notes she is sleeping really well right now o Chronic pain: Opana 10 mg BID; gabapentin 1200 mg TID (6 600 mg tabs daily) o HTN: telmisartan 80 mg QAM, furosemide 40 mg daily PRN swelling o Hx breast cancer: letrozole 2.5 mg daily o Tobacco abuse/COPD: Anoro daily (notes that she doesn't have Anoro because the cost went back up to $45/month and she cannot afford this) albuterol HFA 3-4 times weekly; loratadine 10 mg daily + montelukast 10 mg daily, fluticasone and azelastine nasal sprays daily - Chantix 1 mg BID. Notes that she has switched to vaping, and knows that this is not necessarily a safer long term solution. She and PCP discussed adding nicotine patches, but these are too expensive for her right now  Pharmacist Clinical Goal(s):  Marland Kitchen Over the next 90 days, patient will work with PharmD and provider towards optimized medication management  Interventions: . Comprehensive medication review performed, medication list updated in electronic medical record . Inter-disciplinary care team collaboration (see longitudinal plan of care) . Extra Help application completed ~ 4 weeks ago. Per [employer], patient is now LIS 4, which is only partial extra help. She has not spent the $600 out of pocket to qualify for Anoro patient assistance, however, she will qualify for [employer] assistance through [employer]. Will collaborate w/ PCP to  send this prescription to the pharmacy, if amenable. We will need to demonstrate proof of her copay to access patient assistance, so the order must go to the pharmacy. Will collaborate w/ CPhT to mail patient portion of the Stiolto application to her. Will collaborate w/ PCP for provider portion of application.  . Discussed that [place] [employer] can provide a supply of nicotine patches for free. Patient will call [place] [employer] to investigate this. If this option is not available for her long term, we can pursue coverage w/ GoodRx at [employer], as cost is ~$24/box with GoodRx card.   Patient Self Care Activities:  . Patient will take medications as prescribed  Please see past updates related to this goal by clicking on the "Past Updates" button in the selected goal          Plan:  - Will collaborate w/ patient, provider, and CPhT as above  [name], PharmD, [place], [place] Pharmacist [facility] [phone]

## 2019-07-07 ENCOUNTER — Encounter: Payer: Self-pay | Admitting: Pharmacist

## 2019-07-07 ENCOUNTER — Other Ambulatory Visit: Payer: Self-pay | Admitting: Pharmacy Technician

## 2019-07-07 NOTE — Patient Outreach (Signed)
[place]) Care Management  07/07/2019  Ashley Cross [birth year] [id number]                                      Medication Assistance Referral  Referral From: [facility] Embedded RPh [name] T.   Medication/Company: Beau [employer]anny / [employer] Patient application portion:  Mailed Provider application portion:  N/A Embedded RPh had signed while in clinic to Dr. Olivia Mackie McLean-Scocuzza Provider address/fax verified via: Office website     Follow up:  Will follow up with patient in 5-15 business days to confirm application(s) have been received.  Aftyn Nott P. Mordechai Matuszak, [place]  [phone]

## 2019-07-09 ENCOUNTER — Other Ambulatory Visit: Payer: Self-pay | Admitting: Pharmacy Technician

## 2019-07-09 NOTE — Patient Outreach (Signed)
[place] [facility]) Care Management  07/09/2019  Ashley Cross [birth year] [id number]  Received the following in basket message from embedded [facility] RPh [name]:  Would you have a chance to call [facility] pharmacy and ask them to run the [employer] (PA is approved) and then reach out to [place] for OOP?  Spoke to [place] at [facility] and she was able to get an approved claim for [employer]. Reached out to [place] at [facility] to obtain an [employer] cost for the [employer] to submit to the patient assistance company.  Once patient's information is received back will be able to submit to [facility] for a determination.  [name], [place]  [phone]

## 2019-07-16 ENCOUNTER — Other Ambulatory Visit: Payer: Self-pay

## 2019-07-16 ENCOUNTER — Ambulatory Visit (INDEPENDENT_AMBULATORY_CARE_PROVIDER_SITE_OTHER): Payer: PPO

## 2019-07-16 VITALS — BP 112/65 | HR 76

## 2019-07-16 DIAGNOSIS — I10 Essential (primary) hypertension: Secondary | ICD-10-CM | POA: Diagnosis not present

## 2019-07-16 NOTE — Progress Notes (Signed)
Patient is here for a BP check due to bp being high at last visit, as per patient.  Currently patients BP is 112/65 and BPM is 76.  Patient has no complaints of headaches, blurry vision, chest pain, arm pain, light headedness, dizziness, and nor jaw pain. Please see previous note for order.

## 2019-07-17 ENCOUNTER — Other Ambulatory Visit: Payer: Self-pay | Admitting: Pharmacy Technician

## 2019-07-17 NOTE — Patient Outreach (Signed)
[facility]) Care Management  07/17/2019  Ashley Cross Apr 05, 1955 [id number]   Successful call placed to patient regarding patient assistance application(s) for Stiolto with [employer] , HIPAA identifiers verified.   Patient informed she did receive the application but has not completed them at this time. She plans to complete them today and either mail them back or bring them to the office for embedded [place]. Will route note to embedded pharmacist as [name].  Follow up:  Will route note to embedded [facility] pharmacist [name] for case closure if document(s) have not been received in the next 15 business days.  [name] P. Wynee Matarazzo, [place]  [phone]

## 2019-07-23 ENCOUNTER — Telehealth: Payer: Self-pay | Admitting: Internal Medicine

## 2019-07-23 NOTE — Telephone Encounter (Signed)
Faxed coverage determination request form for medicare prior authorization request for therapy/ Stiolto respimat  2.5-2.5MCG/ACT Aersol/07-08-19/faxed on 07-23-19

## 2019-07-24 ENCOUNTER — Other Ambulatory Visit: Payer: Self-pay | Admitting: Psychiatry

## 2019-07-24 DIAGNOSIS — F331 Major depressive disorder, recurrent, moderate: Secondary | ICD-10-CM

## 2019-07-29 ENCOUNTER — Telehealth (INDEPENDENT_AMBULATORY_CARE_PROVIDER_SITE_OTHER): Payer: PPO | Admitting: Psychiatry

## 2019-07-29 ENCOUNTER — Other Ambulatory Visit: Payer: Self-pay | Admitting: Pharmacy Technician

## 2019-07-29 ENCOUNTER — Other Ambulatory Visit: Payer: Self-pay

## 2019-07-29 ENCOUNTER — Encounter: Payer: Self-pay | Admitting: Psychiatry

## 2019-07-29 DIAGNOSIS — F331 Major depressive disorder, recurrent, moderate: Secondary | ICD-10-CM | POA: Diagnosis not present

## 2019-07-29 DIAGNOSIS — F1010 Alcohol abuse, uncomplicated: Secondary | ICD-10-CM | POA: Diagnosis not present

## 2019-07-29 DIAGNOSIS — F17200 Nicotine dependence, unspecified, uncomplicated: Secondary | ICD-10-CM | POA: Diagnosis not present

## 2019-07-29 MED ORDER — ARIPIPRAZOLE 10 MG PO TABS: 10.0000 mg | ORAL_TABLET | Freq: Every day | ORAL | 0 refills | Status: DC

## 2019-07-29 MED ORDER — TOPIRAMATE 25 MG PO TABS: 25.0000 mg | ORAL_TABLET | Freq: Every day | ORAL | 1 refills | Status: DC

## 2019-07-29 NOTE — Progress Notes (Signed)
Provider Location : [facility] Patient Location : Home  Virtual Visit via Telephone Note  I connected with Ashley Cross on 07/29/19 at  2:00 PM EDT by telephone and verified that I am speaking with the correct person using two identifiers.   I discussed the limitations, risks, security and privacy concerns of performing an evaluation and management service by telephone and the availability of in person appointments. I also discussed with the patient that there may be a patient responsible charge related to this service. The patient expressed understanding and agreed to proceed.    I discussed the assessment and treatment plan with the patient. The patient was provided an opportunity to ask questions and all were answered. The patient agreed with the plan and demonstrated an understanding of the instructions.   The patient was advised to call back or seek an in-person evaluation if the symptoms worsen or if the condition fails to improve as anticipated.    [place] MD OP Progress Note  07/29/2019 2:32 PM Ashley Cross  MRN:  [id number]  Chief Complaint:  Chief Complaint    Follow-up     HPI: Ashley Cross is a 64 year old Caucasian female on disability, lives in [place], has a history of MDD, alcohol use disorder, history of breast cancer, chronic pain, hypertension was evaluated by phone today.  Patient declined video call and preferred to do a phone call.  Patient today reports she is currently making progress with regards to her depressive symptoms.  She reports 2 weeks ago she had an episode of depression when she could not get out of the bed for 3 days.  She reports that was scary for her.  She reports it was difficult for her to take the Geodon with that many calories of food at the end of the day.  She hence stopped the Geodon and went back on the Abilify.  She reports the reason she stopped the Abilify was because she was craving food like cheese cakes and ice creams  and carbohydrates.  She reports she does not want to blame that on the Abilify and would like to give it another try since it helps her.  She wants to stay on the Abilify at this time.  Patient reports sleep is good on the [place].  She denies any suicidality, homicidality or perceptual disturbances.  She has not started psychotherapy sessions yet since Ms. [name] her previous therapist left.  She however is motivated to restart therapy.  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  She reports it gives her a purpose to wake up in the morning.  She reports she works 3 days a week.  Patient denies any other concerns today.  Visit Diagnosis:    ICD-10-CM   1. MDD (major depressive disorder), recurrent episode, moderate (HCC)  F33.1 ARIPiprazole (ABILIFY) 10 MG tablet    topiramate (TOPAMAX) 25 MG tablet  2. Alcohol use disorder, mild, abuse  F10.10   3. Tobacco use disorder  F17.200     Past Psychiatric History: I have reviewed past psychiatric history from my progress note on 09/16/2018.  Past trials of Zoloft, Wellbutrin, Lexapro, Celexa.  Past Medical History:  Past Medical History:  Diagnosis Date  . Anxiety   . Breast cancer ([place])    Left breast(nipple mass) 2018  . Cervical stenosis of spine   . Chicken pox   . Chronic back pain   .  Colon polyps   . COPD (chronic obstructive pulmonary disease) ([place])   . Cyst of left nipple   . Depression   . Hypertension   . Insomnia   . Personal history of radiation therapy     Past Surgical History:  Procedure Laterality Date  . BACK SURGERY     x 3 (2008/05/09 x 2; 05-10-2011 x 1)   . BREAST BIOPSY Left    core- neg  . BREAST LUMPECTOMY Left 05/09/2016  . BREAST SURGERY Left    breast biopsy  . CERVICAL CONE BIOPSY    . CERVICAL FUSION     times 2  . COLONOSCOPY WITH PROPOFOL N/A 04/18/2015   Procedure: COLONOSCOPY WITH PROPOFOL;  Surgeon: [name], MD;  [facility]  ENDOSCOPY;  Service: Endoscopy;  Laterality: N/A;  . ELBOW SURGERY     May 09, 2012 b/l elbows per pt ulnar nerve   . ELBOW SURGERY     x2   . EPIDURAL BLOCK INJECTION     Dr. Maryjean Ka   . MASS EXCISION Left 04/12/2016   Procedure: EXCISION OF LEFT NIPPLE MASS;  Surgeon: Stark Klein, MD;  Location: [facility];  Service: General;  Laterality: Left;  . NECK SURGERY    . POSTERIOR CERVICAL FUSION/FORAMINOTOMY  03/07/2011   Procedure: POSTERIOR CERVICAL FUSION/FORAMINOTOMY LEVEL 4;  Surgeon: Eustace Moore, MD;  Location: [place] NEURO ORS;  Service: Neurosurgery;  Laterality: N/A;  cervical three to thoracic one posterior cervical fusion   . SENTINEL NODE BIOPSY Left 05/29/2016   Procedure: LEFT SENTINEL LYMPH NODE BIOPSY;  Surgeon: Stark Klein, MD;  Location: [facility];  Service: General;  Laterality: Left;  . TONSILLECTOMY      Family Psychiatric History: Reviewed family psychiatric history from my progress note on 09/16/2018  Family History:  Family History  Problem Relation Age of Onset  . Breast cancer Mother 77  . Hypertension Mother   . Cancer Mother        breast dx'ed 31   . Cancer Father        prostate  . Hypertension Father   . Drug abuse Daughter   . Hypertension Maternal Grandmother   . Breast cancer Cousin     Social History: Reviewed social history from my progress note on 09/16/2018 Social History   Socioeconomic History  . Marital status: Divorced    Spouse name: Not on file  . Number of children: 2  . Years of education: Not on file  . Highest education level: Not on file  Occupational History  . Not on file  Tobacco Use  . Smoking status: Current Every Day Smoker    Packs/day: 0.75    Years: 30.00    Pack years: 22.50    Types: Cigarettes  . Smokeless tobacco: Never Used  . Tobacco comment: currently 5 cigarettes per day reported 06/22/2019  Vaping Use  . Vaping Use: Every day  Substance and Sexual Activity  . Alcohol use: Yes    Comment: social  .  Drug use: No    Comment: on opana since jan 2018  . Sexual activity: Not on file  Other Topics Concern  . Not on file  Social History Narrative   As of 01/25/17 not sexually active of in relationship    Divorced   2 kids son and daughter (though daughter died of suicide in 05-09-2017)   Son lives in [place] now as of 12/2018       Disability  Loves animals has 3 dogs           Social Determinants of Health   Financial Resource Strain: Medium Risk  . Difficulty of Paying Living Expenses: Somewhat hard  Food Insecurity:   . Worried About Charity fundraiser in the Last Year:   . Arboriculturist in the Last Year:   Transportation Needs:   . Film/video editor (Medical):   Marland Kitchen Lack of Transportation (Non-Medical):   Physical Activity:   . Days of Exercise per Week:   . Minutes of Exercise per Session:   Stress:   . Feeling of Stress :   Social Connections:   . Frequency of Communication with Friends and Family:   . Frequency of Social Gatherings with Friends and Family:   . Attends Religious Services:   . Active Member of Clubs or Organizations:   . Attends Archivist Meetings:   Marland Kitchen Marital Status:     Allergies:  Allergies  Allergen Reactions  . Norvasc [Amlodipine]     Swelling   . Sulfa Antibiotics Itching and Rash  . Sulfonamide Derivatives Itching and Rash    Metabolic Disorder Labs: Lab Results  Component Value Date   HGBA1C 5.1 02/27/2019   No results found for: PROLACTIN Lab Results  Component Value Date   CHOL 219 (H) 02/27/2019   TRIG 54.0 02/27/2019   HDL 133.80 02/27/2019   CHOLHDL 2 02/27/2019   VLDL 10.8 02/27/2019   LDLCALC 74 02/27/2019   LDLCALC 81 02/13/2018   Lab Results  Component Value Date   TSH 2.91 04/03/2019   TSH 7.36 (H) 02/27/2019    Therapeutic Level Labs: No results found for: LITHIUM No results found for: VALPROATE No components found for:  CBMZ  Current Medications: Current Outpatient Medications   Medication Sig Dispense Refill  . albuterol (VENTOLIN HFA) 108 (90 Base) MCG/ACT inhaler Inhale 1-2 puffs into the lungs every 4 (four) hours as needed for wheezing or shortness of breath. 18 g 12  . ARIPiprazole (ABILIFY) 10 MG tablet Take 1 tablet (10 mg total) by mouth daily. 90 tablet 0  . azelastine (ASTELIN) 0.1 % nasal spray     . buPROPion (WELLBUTRIN XL) 300 MG 24 hr tablet Take 1 tablet (300 mg total) by mouth daily. 90 tablet 3  . CHANTIX STARTING MONTH PAK 0.5 MG X 11 & 1 MG X 42 tablet See admin instructions.    . DULoxetine (CYMBALTA) 30 MG capsule Take 3 capsules (90 mg total) by mouth daily. 270 capsule 0  . eszopiclone (LUNESTA) 1 MG TABS tablet TAKE ONE TABLET BY MOUTH IMMEDIATELY BEFORE BEDTIME. 30 tablet 1  . fluticasone (FLONASE) 50 MCG/ACT nasal spray Place 2 sprays into both nostrils daily. Max 2 sprays 16 g 11  . furosemide (LASIX) 40 MG tablet Take 1 tablet (40 mg total) by mouth daily. In am D/c hctz/telmisartan combo pill 90 tablet 1  . gabapentin (NEURONTIN) 600 MG tablet Take 1,200 mg by mouth 3 (three) times daily.    . hydrOXYzine (VISTARIL) 25 MG capsule Take 1 capsule (25 mg total) by mouth every 8 (eight) hours as needed for anxiety. 90 capsule 1  . ipratropium (ATROVENT) 0.06 % nasal spray Place 2 sprays into both nostrils 3 (three) times daily. prn 15 mL 12  . letrozole (FEMARA) 2.5 MG tablet Take 1 tablet (2.5 mg total) by mouth daily. 90 tablet 3  . metoprolol succinate (TOPROL-XL) 25 MG 24 hr tablet Take  1 tablet (25 mg total) by mouth daily. 90 tablet 3  . montelukast (SINGULAIR) 10 MG tablet Take 1 tablet (10 mg total) by mouth at bedtime. 90 tablet 3  . Multiple Vitamins-Minerals (MULTIVITAMIN) tablet Take 1 tablet by mouth daily. 90 tablet 3  . nicotine (NICODERM CQ - DOSED IN MG/24 HOURS) 14 mg/24hr patch Place 1 patch (14 mg total) onto the skin daily. 60 patch 0  . nicotine (NICODERM CQ - DOSED IN MG/24 HOURS) 21 mg/24hr patch Place 1 patch (21 mg  total) onto the skin daily. X 2 months 14 mg x 2 months, 7 mg x 2 months 60 patch 0  . nicotine (NICODERM CQ - DOSED IN MG/24 HR) 7 mg/24hr patch Place 1 patch (7 mg total) onto the skin daily. 60 patch 0  . omeprazole (PRILOSEC) 20 MG capsule     . oxymorphone (OPANA) 10 MG tablet Take 10 mg by mouth 2 (two) times daily.     Marland Kitchen senna-docusate (SENOKOT-S) 8.6-50 MG tablet Take 1-2 tablets by mouth daily as needed for mild constipation. 180 tablet 3  . telmisartan (MICARDIS) 80 MG tablet Take 1 tablet (80 mg total) by mouth daily. 90 tablet 3  . Tiotropium Bromide-Olodaterol (STIOLTO RESPIMAT) 2.5-2.5 MCG/ACT AERS Inhale 2 puffs into the lungs daily. 4 g 11  . topiramate (TOPAMAX) 25 MG tablet Take 1 tablet (25 mg total) by mouth daily. For weight gain side effects of abilify 30 tablet 1   No current facility-administered medications for this visit.     Musculoskeletal: Strength & Muscle Tone: UTA Gait & Station: normal Patient leans: N/A  Psychiatric Specialty Exam: Review of Systems  Musculoskeletal: Positive for arthralgias and back pain.  Psychiatric/Behavioral: Positive for decreased concentration and dysphoric mood (improving). Negative for agitation, behavioral problems, confusion, hallucinations, self-injury, sleep disturbance and suicidal ideas. The patient is not nervous/anxious and is not hyperactive.   All other systems reviewed and are negative.   There were no vitals taken for this visit.There is no height or weight on file to calculate BMI.  General Appearance: UTA  Eye Contact:  UTA  Speech:  Clear and Coherent  Volume:  Normal  Mood:  Depressed improving  Affect:  UTA  Thought Process:  Goal Directed and Descriptions of Associations: Intact  Orientation:  Full (Time, Place, and Person)  Thought Content: Logical   Suicidal Thoughts:  No  Homicidal Thoughts:  No  Memory:  Immediate;   Fair Recent;   Fair Remote;   Fair  Judgement:  Fair  Insight:  Fair   Psychomotor Activity:  UTA  Concentration:  Concentration: Fair and Attention Span: Fair  Recall:  [employer] of Knowledge: Fair  Language: Fair  Akathisia:  No  Handed:  Right  AIMS (if indicated): UTA  Assets:  Communication Skills Desire for Improvement Housing Social Support  ADL's:  Intact  Cognition: WNL  Sleep:  Fair   Screenings: GAD-7     Office Visit from 06/12/2019 in [facility] from 09/30/2017 in [facility]  Total GAD-7 Score 18 18    PHQ2-9     Office Visit from 06/12/2019 in [facility] Visit from 12/31/2018 in [facility] Visit from 09/16/2018 in [facility] Visit from 09/30/2017 in [facility] Office Visit from 08/19/2017 in [facility]  PHQ-2 Total Score 4 0 0 4 4  PHQ-9 Total Score 17 -- --  18 16       Assessment and Plan: Anahli Arvanitis is a 64 year old Caucasian female, divorced, disabled, has a history of depression, chronic pain, hypertension, history of breast cancer was evaluated by phone today.  She is biologically predisposed given her family history of substance abuse problems.  Patient with psychosocial stressors of her own health issues, financial problems, death of her daughter almost 2 years ago.  Patient is currently making progress and is currently back on the Abilify.  Plan as noted below.  Plan MDD-improving Cymbalta 90 mg p.o. daily Wellbutrin reduced dosage at 300 mg p.o. daily Restart Abilify 10 mg po daily. Start Topamax 25 mg po daily for weight gain side effects. Lunesta 1 mg p.o. nightly for sleep Hydroxyzine 25 mg p.o. daily as needed for severe anxiety symptoms  Alcohol use disorder mild-improving She will continue to cut back.  Tobacco use disorder-improving Provided smoking cessation counseling. Patient reports she is currently connected with [facility]  quit now program.  Patient to continue to follow-up with therapist for CBT.  I have communicated with front desk to schedule patient with a new therapist.  Follow-up in clinic in 6 weeks or sooner if needed.  I have spent atleast 20 minutes non face to face with patient today. More than 50 % of the time was spent for preparing to see the patient ( e.g., review of test, records ),  ordering medications and test ,psychoeducation and supportive psychotherapy and care coordination,as well as documenting clinical information in electronic health record. This note was generated in part or whole with voice recognition software. Voice recognition is usually quite accurate but there are transcription errors that can and very often do occur. I apologize for any typographical errors that were not detected and corrected.         Ursula Alert, MD 07/29/2019, 2:32 PM

## 2019-07-29 NOTE — Patient Outreach (Signed)
[facility]) Care Management  07/29/2019  Ashley Cross [birth year] [id number]  Received both patient and provider portion(s) of patient assistance application(s) for [employer]. Faxed completed application and required documents into BI.  Will follow up with company(ies) in 5-10 business days to check status of application(s).  Ashley Cross P. [name], [place]  [phone]

## 2019-07-30 ENCOUNTER — Telehealth: Payer: Self-pay

## 2019-07-30 NOTE — Telephone Encounter (Signed)
PA sent in on 07/09/19. Case Number [id number]. Prior Authorization is Approved.

## 2019-08-04 ENCOUNTER — Other Ambulatory Visit: Payer: Self-pay

## 2019-08-04 ENCOUNTER — Ambulatory Visit (INDEPENDENT_AMBULATORY_CARE_PROVIDER_SITE_OTHER): Payer: PPO | Admitting: Licensed Clinical Social Worker

## 2019-08-04 DIAGNOSIS — F331 Major depressive disorder, recurrent, moderate: Secondary | ICD-10-CM | POA: Diagnosis not present

## 2019-08-04 NOTE — Progress Notes (Signed)
Virtual Visit via Video Note  I connected with Ashley Cross on 08/04/19 at 12:30 PM EDT by a video enabled telemedicine application and verified that I am speaking with the correct person using two identifiers.  Location: Patient: home Provider: [facility]   I discussed the limitations of evaluation and management by telemedicine and the availability of in person appointments. The patient expressed understanding and agreed to proceed.   I discussed the assessment and treatment plan with the patient. The patient was provided an opportunity to ask questions and all were answered. The patient agreed with the plan and demonstrated an understanding of the instructions.   The patient was advised to call back or seek an in-person evaluation if the symptoms worsen or if the condition fails to improve as anticipated.  I provided 60 minutes of non-face-to-face time during this encounter.   Ashley Cross R Sanel Stemmer, LCSW    THERAPIST PROGRESS NOTE  Session Time: 34  Participation Level: Active  Behavioral Response: Neat and Well GroomedAlertAnxious and Depressed  Type of Therapy: Individual Therapy  Treatment Goals addressed: Anxiety and Coping  Interventions: Supportive and Other: grief counseling  Summary: Ashley Cross is a 64 y.o. female who presents with some escalation in anxiety and depression symptoms recently triggered by grief.  Allowed pt to explore and express current thoughts and feelings associated with the death of daughter.  Allowed pt to express how she is processing through the grief, and supported pt unconditionally.   Reviewed grief process with pt and reminded that this is her journey--there is no right or wrong way to grieve. Pt is finding comfort in her pets: birds, cats, dogs, and fish.   Pt expressed excitement of son's pending visit next week from [place]. Encouraged pt to enjoy time w/ family and other social engagements.   Suicidal/Homicidal: No  Therapist  Response: Ashley Cross is displaying fluctuating/intermittent progress due to recent grief triggers. LCSW and pt will continue through trauma and grief at future sessions.  Plan: Return again in 3 weeks.  Diagnosis: Axis I: Major Depression, Recurrent moderate    Axis II: No diagnosis    Ashley Cross Ashley Raper, LCSW 08/04/2019

## 2019-08-04 NOTE — Patient Instructions (Signed)
Caring for Your Mental Health Mental health is emotional, psychological, and social well-being. Mental health is just as important as physical health. In fact, mental and physical health are connected, and you need both to be healthy. Some signs of good mental health (well-being) include:  Being able to attend to tasks at home, school, or work.  Being able to manage stress and emotions.  Practicing self-care, which may include: ? A regular exercise pattern. ? A reasonably healthy diet. ? Supportive and trusting relationships. ? The ability to relax and calm yourself (self-calm).  Having pleasurable hobbies and activities to do.  Believing that you have meaning and purpose in your life.  Recovering and adjusting after facing challenges (resilience). You can take steps to build or strengthen these mentally healthy behaviors. There are resources and support to help you with this. Why is caring for mental health important? Caring for your mental health is a big part of staying healthy. Everyone has times when feelings, thoughts, or situations feel overwhelming. Mental health means having the skills to manage what feels overwhelming. If this sense of being overwhelmed persists, however, you might need some help. If you have some of the following signs, you may need to take better care of your mental health or seek help from a health care provider or mental health professional:  Problems with energy or focus.  Changes in eating habits.  Problems sleeping, such as sleeping too much or not enough.  Emotional distress, such as anger, sadness, depression, or anxiety.  Major changes in your relationships.  Losing interest in life or activities that you used to enjoy. If you have any of these symptoms on most days for 2 weeks or longer:  Talk with a close friend or family member about how you are feeling.  Contact your health care provider to discuss your symptoms.  Consider working with a  mental health professional. Your health care provider, family, or friends may be able to recommend a therapist. What can I do to promote emotional and mental health? Managing emotions  Learn to identify emotions and deal with them. Recognizing your emotions is the first step in learning to deal with them.  Practice ways to appropriately express feelings. Remember that you can control your feelings. They do not control you.  Practice stress management techniques, such as: ? Relaxation techniques, like breathing or muscle relaxation exercises. ? Exercise. Regular activity can lower your stress level. ? Changing what you can change and accepting what you cannot change.  Build up your resilience so that you can recover and adjust after big problems or challenges. Practice resilient behaviors and attitudes: ? Set and focus on long-term goals. ? Develop and maintain healthy, supportive relationships. ? Learn to accept change and make the best of the situation. ? Take care of yourself physically by eating a healthy diet, getting plenty of sleep, and exercising regularly. ? Develop self-awareness. Ask others to give feedback about how they see you. ? Practice mindfulness meditation to help you stay calm when dealing with daily challenges. ? Learn to respond to situations in healthy ways, rather than reacting with your emotions. ? Keep a positive attitude, and believe in yourself. Your view of yourself affects your mental health. ? Develop your listening and empathy skills. These will help you deal with difficult situations and communications.  Remember that emotions can be used as a good source of communication and are a great source of energy. Try to laugh and find humor in life.   Sleeping  Get the right amount and quality of sleep. Sleep has a big impact on physical and mental health. To improve your sleep: ? Go to bed and wake up around the same time every day. ? Limit screen time before  bedtime. This includes the use of your cell phone, TV, computer, and tablet. ? Keep your bedroom dark and cool. Activity   Exercise or do some physical activity regularly. This helps: ? Keep your body strong, especially during times of stress. ? Get rid of chemicals in your body (hormones) that build up when you are stressed. ? Build up your resilience. Eating and drinking   Eat a healthy diet that includes whole grains, vegetables, fresh fruits, and lean proteins. If you have questions about what foods are best for you, ask your health care provider.  Try not to turn to sweet, salty, or otherwise unhealthy foods when you are tired or unhappy. This can lead to unwanted weight gain and is not a healthy way to cope with emotions. Where to find more information You can find more information about how to care for your mental health from:  National Alliance on Mental Illness (NAMI): www.nami.org  National Institute of Mental Health: www.nimh.nih.gov  Centers for Disease Control and Prevention: www.cdc.gov/hrqol/wellbeing.htm Contact a health care provider if:  You lose interest in being with others or you do not want to leave the house.  You have a hard time completing your normal activities or you have less energy than normal.  You cannot stay focused or you have problems with memory.  You feel that your senses are heightened, and this makes you upset or concerned.  You feel nervous or have rapid mood changes.  You are sleeping or eating more or less than normal.  You question reality or you show odd behavior that disturbs you or others. Get help right away if:  You have thoughts about hurting yourself or others. If you ever feel like you may hurt yourself or others, or have thoughts about taking your own life, get help right away. You can go to your nearest emergency department or call:  Your local emergency services (911 in the [place]  A suicide crisis helpline, such as the  National Suicide Prevention Lifeline at 1-800-273-8255. This is open 24 hours a day. Summary  Mental health is not just the absence of mental illness. It involves understanding your emotions and behaviors, and taking steps to cope with them in a healthy way.  If you have symptoms of mental or emotional distress, get help from family, friends, a health care provider, or a mental health professional.  Practice good mental health behaviors such as stress management skills, self-calming skills, exercise, and healthy sleeping and eating. This information is not intended to replace advice given to you by your health care provider. Make sure you discuss any questions you have with your health care provider. Document Revised: 01/04/2017 Document Reviewed: 06/05/2016 Elsevier Patient Education  2020 Elsevier Inc.  

## 2019-08-05 ENCOUNTER — Other Ambulatory Visit: Payer: Self-pay | Admitting: Pharmacy Technician

## 2019-08-05 NOTE — Patient Outreach (Signed)
[facility] [facility]) Care Management  08/05/2019  Ashley Cross [birth year] [id number]  Care coordination call placed to [employer] in regards to patient's application for Stiolto.  Spoke to Gilby who informed he was able to get patient APPROVED 08/05/2019-02/05/2020 as he was able to locate the OOP spend for the Stiolto that was submitted along with the application as patient has partial LIS.  He informed patient should receive the medication in the next 5-7 business days as he placed the order with the pharmacy today.  Will follow up with patient in 7-10 business days to confirm.  Ashley Cross P. Guillermo Difrancesco, [place]  [phone]

## 2019-08-17 ENCOUNTER — Other Ambulatory Visit: Payer: Self-pay | Admitting: Pharmacy Technician

## 2019-08-17 NOTE — Patient Outreach (Signed)
[place] [facility]) Care Management  08/17/2019  [name] 30-Sep-1955 [id number]   Unsuccessful call placed to patient regarding patient assistance medication delivery of Stiolto from [employer], HIPAA compliant voicemail left.   Was calling patient to inquire if she has received the Stiolto inhaler but unfortunately she did not answer the phone.  Follow up:  Will follow up with 1 more attempt in 3-7 business days if call is not returned.  Candon Caras P. Mervil Wacker, [place]  [phone]

## 2019-08-18 ENCOUNTER — Telehealth: Payer: Self-pay

## 2019-08-18 NOTE — Telephone Encounter (Signed)
topamax 25mg  want to increase because of the weight gain with the abilify wanted to increase to 50mg  to help iwth weight

## 2019-08-18 NOTE — Telephone Encounter (Signed)
I called, no answer and left VM for her to call back and discuss her concerns.

## 2019-08-23 ENCOUNTER — Other Ambulatory Visit: Payer: Self-pay | Admitting: Internal Medicine

## 2019-08-23 DIAGNOSIS — F419 Anxiety disorder, unspecified: Secondary | ICD-10-CM

## 2019-08-23 MED ORDER — BUPROPION HCL ER (XL) 300 MG PO TB24: 300.0000 mg | ORAL_TABLET | Freq: Every day | ORAL | 1 refills | Status: DC

## 2019-08-24 ENCOUNTER — Other Ambulatory Visit: Payer: Self-pay | Admitting: Pharmacy Technician

## 2019-08-24 ENCOUNTER — Other Ambulatory Visit: Payer: PPO

## 2019-08-24 DIAGNOSIS — M961 Postlaminectomy syndrome, not elsewhere classified: Secondary | ICD-10-CM | POA: Diagnosis not present

## 2019-08-24 NOTE — Patient Outreach (Signed)
[place] [facility]) Care Management  08/24/2019  Orlena Garmon 03-17-1955 [id number]  Second unsuccessful call placed to patient regarding patient assistance medication delivery of Stiolto from [facility].  HIPAA compliant voicemail left.  Was calling patient to inquire if she has received the Stiolto inhaler but unfortunately she did not answer the phone.  Will route note to embedded [facility] [name] to send patient a MYCHART message to see if a response will be given and to also inform patient how to obtain her refills. Patient can also be notified at her next CCM appointment with the pharmacist.  Will close patient assistance case at this time as patient was approved and medication was shipped and will remove myself form care team.  Luiz Ochoa. Katricia Prehn, [place]  [phone]

## 2019-08-25 ENCOUNTER — Ambulatory Visit (INDEPENDENT_AMBULATORY_CARE_PROVIDER_SITE_OTHER): Payer: PPO | Admitting: Licensed Clinical Social Worker

## 2019-08-25 ENCOUNTER — Other Ambulatory Visit: Payer: Self-pay | Admitting: Psychiatry

## 2019-08-25 ENCOUNTER — Other Ambulatory Visit: Payer: Self-pay

## 2019-08-25 ENCOUNTER — Other Ambulatory Visit (INDEPENDENT_AMBULATORY_CARE_PROVIDER_SITE_OTHER): Payer: PPO

## 2019-08-25 DIAGNOSIS — I10 Essential (primary) hypertension: Secondary | ICD-10-CM

## 2019-08-25 DIAGNOSIS — F331 Major depressive disorder, recurrent, moderate: Secondary | ICD-10-CM

## 2019-08-25 DIAGNOSIS — R946 Abnormal results of thyroid function studies: Secondary | ICD-10-CM

## 2019-08-25 DIAGNOSIS — Z113 Encounter for screening for infections with a predominantly sexual mode of transmission: Secondary | ICD-10-CM

## 2019-08-25 LAB — COMPREHENSIVE METABOLIC PANEL
ALT: 15 U/L (ref 0–35)
AST: 15 U/L (ref 0–37)
Albumin: 4.3 g/dL (ref 3.5–5.2)
Alkaline Phosphatase: 97 U/L (ref 39–117)
BUN: 13 mg/dL (ref 6–23)
CO2: 26 mEq/L (ref 19–32)
Calcium: 10 mg/dL (ref 8.4–10.5)
Chloride: 102 mEq/L (ref 96–112)
Creatinine, Ser: 0.67 mg/dL (ref 0.40–1.20)
GFR: 88.84 mL/min (ref >60.00)
Glucose, Bld: 102 mg/dL — ABNORMAL HIGH (ref 70–99)
Potassium: 3.8 mEq/L (ref 3.5–5.1)
Sodium: 139 mEq/L (ref 135–145)
Total Bilirubin: 0.5 mg/dL (ref 0.2–1.2)
Total Protein: 7.2 g/dL (ref 6.0–8.3)

## 2019-08-25 LAB — CBC WITH DIFFERENTIAL/PLATELET
Basophils Absolute: 0.1 10*3/uL (ref 0.0–0.1)
Basophils Relative: 0.5 % (ref 0.0–3.0)
Eosinophils Absolute: 0 10*3/uL (ref 0.0–0.7)
Eosinophils Relative: 0.1 % (ref 0.0–5.0)
HCT: 39.2 % (ref 36.0–46.0)
Hemoglobin: 13.2 g/dL (ref 12.0–15.0)
Lymphocytes Relative: 12.1 % (ref 12.0–46.0)
Lymphs Abs: 1.2 10*3/uL (ref 0.7–4.0)
MCHC: 33.7 g/dL (ref 30.0–36.0)
MCV: 94.7 fl (ref 78.0–100.0)
Monocytes Absolute: 0.7 10*3/uL (ref 0.1–1.0)
Monocytes Relative: 7.1 % (ref 3.0–12.0)
Neutro Abs: 8.1 10*3/uL — ABNORMAL HIGH (ref 1.4–7.7)
Neutrophils Relative %: 80.2 % — ABNORMAL HIGH (ref 43.0–77.0)
Platelets: 276 10*3/uL (ref 150.0–400.0)
RBC: 4.14 Mil/uL (ref 3.87–5.11)
RDW: 12.9 % (ref 11.5–15.5)
WBC: 10.2 10*3/uL (ref 4.0–10.5)

## 2019-08-25 LAB — LIPID PANEL
Cholesterol: 203 mg/dL — ABNORMAL HIGH (ref 0–200)
HDL: 102.3 mg/dL (ref >39.00)
LDL Cholesterol: 85 mg/dL (ref 0–99)
NonHDL: 100.46
Total CHOL/HDL Ratio: 2
Triglycerides: 77 mg/dL (ref 0.0–149.0)
VLDL: 15.4 mg/dL (ref 0.0–40.0)

## 2019-08-25 LAB — TSH: TSH: 0.71 u[IU]/mL (ref 0.35–4.50)

## 2019-08-25 NOTE — Progress Notes (Signed)
Virtual Visit via Video Note  I connected with Ashley Cross on 08/25/19 at  2:30 PM EDT by a video enabled telemedicine application and verified that I am speaking with the correct person using two identifiers.  Location: Patient: home Provider: [facility]   I discussed the limitations of evaluation and management by telemedicine and the availability of in person appointments. The patient expressed understanding and agreed to proceed.   The patient was advised to call back or seek an in-person evaluation if the symptoms worsen or if the condition fails to improve as anticipated.  I provided 60  minutes of non-face-to-face time during this encounter.   Troi Florendo R Marguita Venning, LCSW    THERAPIST PROGRESS NOTE  Session Time: 2:30-3:30 pm  Participation Level: Active  Behavioral Response: Neat and Well GroomedAlertDepressed  Type of Therapy: Individual Therapy  Treatment Goals addressed: Coping  Interventions: CBT and Supportive  Summary: Ashley Cross is a 64 y.o. female who presents with continuing symptoms related to diagnosis. Allowed pt to explore and express thoughts and feelings related to recent visit from son (NM) and explored continuing symptoms related to loss of daughter. Pt worried that she may have PTSD: "I hate going out in public and fearful that I might run into people I know".  Role-played some scenarios and encouraged pt to reframe conversations if people ask questions that she is not ready to answer.  Pt has a pet that may need veterinary support (injury). Pt will know after next week. Pt continuing to enjoy time with pets and her best friend (rekindled friendship from the past).   Suicidal/Homicidal: No  Therapist Response: Camrie is continuing to develop overall management of grief and mood. Pt is being intentional about engaging in activities that boost mood and manage stress.  Reviewed activities that make pt feel better about self and encouraged pt to  do more of these activities.   Plan: Return again in 3 weeks. Ongoing treatment plan to include managing grief, managing overall mood, and stress/anxiety.   Diagnosis: Axis I: Bereavement, Generalized Anxiety Disorder and Major Depression, Recurrent severe    Axis II: No diagnosis    Rachel Bo Ramzy Cappelletti, LCSW 08/25/2019

## 2019-08-26 LAB — HIV ANTIBODY (ROUTINE TESTING W REFLEX): HIV 1&2 Ab, 4th Generation: NONREACTIVE

## 2019-08-27 ENCOUNTER — Ambulatory Visit (INDEPENDENT_AMBULATORY_CARE_PROVIDER_SITE_OTHER): Payer: PPO | Admitting: Pharmacist

## 2019-08-27 DIAGNOSIS — F329 Major depressive disorder, single episode, unspecified: Secondary | ICD-10-CM | POA: Diagnosis not present

## 2019-08-27 DIAGNOSIS — J449 Chronic obstructive pulmonary disease, unspecified: Secondary | ICD-10-CM

## 2019-08-27 DIAGNOSIS — F17210 Nicotine dependence, cigarettes, uncomplicated: Secondary | ICD-10-CM

## 2019-08-27 DIAGNOSIS — F419 Anxiety disorder, unspecified: Secondary | ICD-10-CM

## 2019-08-27 NOTE — Patient Instructions (Signed)
Visit Information  Goals Addressed              This Visit's Progress     Patient Stated   .  "I want to be healthy" (pt-stated)        CARE PLAN ENTRY (see longtitudinal plan of care for additional care plan information)  Current Barriers:  . Social, financial, community barriers: o Patient notes she is having a hard day today w/ missing her daughter. However, notes she has a friend that she has reconnected with that has been helping her clean her house and clean [name]'s room.  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 Polypharmacy; complex patient with multiple comorbidities including chronic pain, depression, hx breast cancer, tobacco use disorder, alcohol abuse, HTN o Major depression: exacerbated by death of her daughter, [name]. Follows w/ Dr. Shea Evans; duloxetine 90 mg daily (30 mg x3) bupropion XL 300 mg daily, ziprasidone 20 mg daily o Insomina: Lunesta 1 mg QPM, PRN hydroxyzine; notes she is sleeping really well right now o Chronic pain: Opana 10 mg BID; gabapentin 1200 mg TID (6 600 mg tabs daily) o HTN: telmisartan 80 mg QAM, furosemide 40 mg daily PRN swelling o Hx breast cancer: letrozole 2.5 mg daily o Tobacco abuse/COPD:Stiolto daily, HFA PRN - APPROVED for Stiolto assistance through [employer] through 02/05/20 - Notes today that she has "accidentally" been smoking a lot less, sometimes going 5-8 hours without smoking and without craving it. Thinking about setting a quit date in August. Thought about choosing her daughter's anniversary in Sept, but knows she would not be able to do it then.  Pharmacist Clinical Goal(s):  Marland Kitchen Over the next 90 days, patient will work with PharmD and provider towards optimized medication management  Interventions: . Comprehensive medication review performed, medication list updated in electronic medical record . Inter-disciplinary care team collaboration (see  longitudinal plan of care) . Reviewed refill process for Stiolto assistance. Patient verbalized understanding . Provided empathetic listening as patient discussed her emotional struggles. Encouraged continued support w/ mental health providers.  . Praised patient for reduction in tobacco product use. Discussed setting a quit date in August. Patient notes that she is going to contemplate this  Patient Self Care Activities:  . Patient will take medications as prescribed  Please see past updates related to this goal by clicking on the "Past Updates" button in the selected goal         The patient verbalized understanding of instructions provided today and declined a print copy of patient instruction materials.   Plan:  - Scheduled f/u call in ~ 4 weeks  [name], PharmD, [place], [place] Pharmacist [place] [phone]

## 2019-08-27 NOTE — Chronic Care Management (AMB) (Signed)
Chronic Care Management   Follow Up Note   08/27/2019 Name: Ashley Cross MRN: [id number] DOB: 07/06/1955  Referred by: McLean-Scocuzza, Nino Glow, MD Reason for referral : Chronic Care Management (Medication Management)   Ashley Cross is a 64 y.o. year old female who is a primary care patient of McLean-Scocuzza, Nino Glow, MD. The CCM team was consulted for assistance with chronic disease management and care coordination needs.    Contacted patient for medication management review.   Review of patient status, including review of consultants reports, relevant laboratory and other test results, and collaboration with appropriate care team members and the patient's provider was performed as part of comprehensive patient evaluation and provision of chronic care management services.    SDOH (Social Determinants of Health) assessments performed: Yes See Care Plan activities for detailed interventions related to SDOH)  SDOH Interventions     Most Recent Value  SDOH Interventions  SDOH Interventions for the Following Domains Depression, Tobacco  Financial Strain Interventions Other (Comment)  [medication assistance]  Tobacco Interventions Cessation Materials Given and Reviewed  Depression Interventions/Treatment  Counseling       Outpatient Encounter Medications as of 08/27/2019  Medication Sig  . albuterol (VENTOLIN HFA) 108 (90 Base) MCG/ACT inhaler Inhale 1-2 puffs into the lungs every 4 (four) hours as needed for wheezing or shortness of breath.  . ARIPiprazole (ABILIFY) 10 MG tablet Take 1 tablet (10 mg total) by mouth daily.  Marland Kitchen azelastine (ASTELIN) 0.1 % nasal spray   . buPROPion (WELLBUTRIN XL) 300 MG 24 hr tablet Take 1 tablet (300 mg total) by mouth daily. Further refills psych Dr. Shea Evans  . CHANTIX STARTING MONTH PAK 0.5 MG X 11 & 1 MG X 42 tablet See admin instructions.  . DULoxetine (CYMBALTA) 30 MG capsule Take 3 capsules (90 mg total) by mouth daily.  .  eszopiclone (LUNESTA) 1 MG TABS tablet TAKE ONE TABLET BY MOUTH IMMEDIATELY BEFORE BEDTIME.  . fluticasone (FLONASE) 50 MCG/ACT nasal spray Place 2 sprays into both nostrils daily. Max 2 sprays  . furosemide (LASIX) 40 MG tablet Take 1 tablet (40 mg total) by mouth daily. In am D/c hctz/telmisartan combo pill  . gabapentin (NEURONTIN) 600 MG tablet Take 1,200 mg by mouth 3 (three) times daily.  . hydrOXYzine (VISTARIL) 25 MG capsule Take 1 capsule (25 mg total) by mouth every 8 (eight) hours as needed for anxiety.  Marland Kitchen ipratropium (ATROVENT) 0.06 % nasal spray Place 2 sprays into both nostrils 3 (three) times daily. prn  . letrozole (FEMARA) 2.5 MG tablet Take 1 tablet (2.5 mg total) by mouth daily.  . metoprolol succinate (TOPROL-XL) 25 MG 24 hr tablet Take 1 tablet (25 mg total) by mouth daily.  . montelukast (SINGULAIR) 10 MG tablet Take 1 tablet (10 mg total) by mouth at bedtime.  . Multiple Vitamins-Minerals (MULTIVITAMIN) tablet Take 1 tablet by mouth daily.  Marland Kitchen omeprazole (PRILOSEC) 20 MG capsule   . oxymorphone (OPANA) 10 MG tablet Take 10 mg by mouth 2 (two) times daily.   Marland Kitchen senna-docusate (SENOKOT-S) 8.6-50 MG tablet Take 1-2 tablets by mouth daily as needed for mild constipation.  Marland Kitchen telmisartan (MICARDIS) 80 MG tablet Take 1 tablet (80 mg total) by mouth daily.  . Tiotropium Bromide-Olodaterol (STIOLTO RESPIMAT) 2.5-2.5 MCG/ACT AERS Inhale 2 puffs into the lungs daily.  Marland Kitchen topiramate (TOPAMAX) 25 MG tablet TAKE 1 TABLET BY MOUTH DAILY. FOR WEIGHTGAIN SIDE EFFECT OF ABILIFY  . nicotine (NICODERM CQ - DOSED IN MG/24 HOURS)  14 mg/24hr patch Place 1 patch (14 mg total) onto the skin daily.  . nicotine (NICODERM CQ - DOSED IN MG/24 HOURS) 21 mg/24hr patch Place 1 patch (21 mg total) onto the skin daily. X 2 months 14 mg x 2 months, 7 mg x 2 months  . nicotine (NICODERM CQ - DOSED IN MG/24 HR) 7 mg/24hr patch Place 1 patch (7 mg total) onto the skin daily.   No facility-administered encounter  medications on file as of 08/27/2019.     Objective:   Goals Addressed              This Visit's Progress     Patient Stated   .  "I want to be healthy" (pt-stated)        CARE PLAN ENTRY (see longtitudinal plan of care for additional care plan information)  Current Barriers:  . Social, financial, community barriers: o Patient notes she is having a hard day today w/ missing her daughter. However, notes she has a friend that she has reconnected with that has been helping her clean her house and clean [name]'s room.  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 Polypharmacy; complex patient with multiple comorbidities including chronic pain, depression, hx breast cancer, tobacco use disorder, alcohol abuse, HTN o Major depression: exacerbated by death of her daughter, [name]. Follows w/ Dr. Shea Evans; duloxetine 90 mg daily (30 mg x3) bupropion XL 300 mg daily, ziprasidone 20 mg daily o Insomina: Lunesta 1 mg QPM, PRN hydroxyzine; notes she is sleeping really well right now o Chronic pain: Opana 10 mg BID; gabapentin 1200 mg TID (6 600 mg tabs daily) o HTN: telmisartan 80 mg QAM, furosemide 40 mg daily PRN swelling o Hx breast cancer: letrozole 2.5 mg daily o Tobacco abuse/COPD:Stiolto daily, HFA PRN - APPROVED for Stiolto assistance through [employer] through 02/05/20 - Notes today that she has "accidentally" been smoking a lot less, sometimes going 5-8 hours without smoking and without craving it. Thinking about setting a quit date in August. Thought about choosing her daughter's anniversary in Sept, but knows she would not be able to do it then.  Pharmacist Clinical Goal(s):  Marland Kitchen Over the next 90 days, patient will work with PharmD and provider towards optimized medication management  Interventions: . Comprehensive medication review performed, medication list updated in electronic medical  record . Inter-disciplinary care team collaboration (see longitudinal plan of care) . Reviewed refill process for Stiolto assistance. Patient verbalized understanding . Provided empathetic listening as patient discussed her emotional struggles. Encouraged continued support w/ mental health providers.  . Praised patient for reduction in tobacco product use. Discussed setting a quit date in August. Patient notes that she is going to contemplate this  Patient Self Care Activities:  . Patient will take medications as prescribed  Please see past updates related to this goal by clicking on the "Past Updates" button in the selected goal          Plan:  - Scheduled f/u call in ~ 4 weeks  [name], PharmD, [place], [place] Pharmacist [place] [phone]

## 2019-08-31 ENCOUNTER — Other Ambulatory Visit: Payer: Self-pay | Admitting: Internal Medicine

## 2019-08-31 ENCOUNTER — Telehealth: Payer: Self-pay | Admitting: Internal Medicine

## 2019-08-31 DIAGNOSIS — J441 Chronic obstructive pulmonary disease with (acute) exacerbation: Secondary | ICD-10-CM

## 2019-08-31 DIAGNOSIS — H9209 Otalgia, unspecified ear: Secondary | ICD-10-CM

## 2019-08-31 MED ORDER — AMOXICILLIN-POT CLAVULANATE 875-125 MG PO TABS: 1.0000 | ORAL_TABLET | Freq: Two times a day (BID) | ORAL | 0 refills | Status: DC

## 2019-08-31 NOTE — Telephone Encounter (Signed)
-----   Message from [name], MD sent at 08/30/2019  9:03 PM EDT ----- Any sick symptoms since the weekend, what does she mean a little under the weather?  Any cough, uti sx's, sinus sx's, etc?  If so we can do a trial of antibiotic if she is not feeling well with elevated neutrophils in her blood

## 2019-08-31 NOTE — Telephone Encounter (Signed)
Left message to return call.  Patient calling back in. See result note.

## 2019-09-02 NOTE — Telephone Encounter (Signed)
Attempted to contact patient left voicemail regarding her medication concerns.

## 2019-09-04 ENCOUNTER — Telehealth: Payer: Self-pay | Admitting: *Deleted

## 2019-09-06 NOTE — Progress Notes (Signed)
MyChart virtual visit: I verified the patient's demographics and information. She could not come in person and therefore we converted her visit to a MyChart virtual visit.  Patient Care Team: McLean-Scocuzza, Nino Glow, MD as PCP - General (Internal Medicine) Perrin Maltese, MD (Internal Medicine) Delice Bison, Charlestine Massed, NP as Nurse Practitioner (Hematology and Oncology) Nicholas Lose, MD as Consulting Physician (Hematology and Oncology) Stark Klein, MD as Consulting Physician (General Surgery) Kyung Rudd, MD as Consulting Physician ([place]) De Hollings[place], [facility] as Pharmacist (Pharmacist)  DIAGNOSIS:    ICD-10-CM   1. Malignant neoplasm of lower-outer quadrant of left breast of female, estrogen receptor positive ([place])  C50.512    Z17.0     SUMMARY OF ONCOLOGIC HISTORY: Oncology History  Malignant neoplasm of lower-outer quadrant of left breast of female, estrogen receptor positive ([place])  04/12/2016 Initial Diagnosis   Left breast biopsy/ lumpectomy: IDC grade 1, 0.8 cm with DCIS grade 1, margins negative, ER 100%, PR 100%, HER-2 negative, Ki-67 15%, T1b NX stage I a   05/21/2016 PET scan   Mild focal hypermetabolic in the left breast, no findings of metastatic disease. mild hypermetabolism is along the right costosternal junction favoring degenerative changes posttraumatic changes involving the right inferior scapular right sacrum bilateral pelvis and right lateral third and fourth ribs   05/29/2016 Surgery   1/4 sentinel lymph node positive with extracapsular extension    06/01/2016 Miscellaneous   Mammaprint low-risk: Average 10 year risk of recurrence 10%; Mammaprint index +0.389   07/24/2016 - 09/07/2016 Radiation Therapy   Adjuvant radiation therapy   09/2016 -  Anti-estrogen oral therapy   Anastrozole, changed to Letrozole after 1-2 months (patient unable to tolerate Anastrozole)     CHIEF COMPLIANT: Follow-up of left breast cancer on letrozole    INTERVAL HISTORY: Ashley Cross is a 64 y.o. with above-mentioned history of left breast cancer treated with lumpectomy, radiation, and who is currently on anti-estrogen therapy with letrozole. Mammogram on 06/08/19 showed no evidence of malignancy bilaterally. She presents to the clinic today for annual follow-up.   ALLERGIES:  is allergic to norvasc [amlodipine], sulfa antibiotics, and sulfonamide derivatives.  MEDICATIONS:  Current Outpatient Medications  Medication Sig Dispense Refill  . albuterol (VENTOLIN HFA) 108 (90 Base) MCG/ACT inhaler Inhale 1-2 puffs into the lungs every 4 (four) hours as needed for wheezing or shortness of breath. 18 g 12  . amoxicillin-clavulanate (AUGMENTIN) 875-125 MG tablet Take 1 tablet by mouth 2 (two) times daily. With food 14 tablet 0  . ARIPiprazole (ABILIFY) 10 MG tablet Take 1 tablet (10 mg total) by mouth daily. 90 tablet 0  . azelastine (ASTELIN) 0.1 % nasal spray     . buPROPion (WELLBUTRIN XL) 300 MG 24 hr tablet Take 1 tablet (300 mg total) by mouth daily. Further refills psych Dr. Shea Evans 90 tablet 1  . CHANTIX STARTING MONTH PAK 0.5 MG X 11 & 1 MG X 42 tablet See admin instructions.    . DULoxetine (CYMBALTA) 30 MG capsule Take 3 capsules (90 mg total) by mouth daily. 270 capsule 0  . eszopiclone (LUNESTA) 1 MG TABS tablet TAKE ONE TABLET BY MOUTH IMMEDIATELY BEFORE BEDTIME. 30 tablet 0  . fluticasone (FLONASE) 50 MCG/ACT nasal spray Place 2 sprays into both nostrils daily. Max 2 sprays 16 g 11  . furosemide (LASIX) 40 MG tablet Take 1 tablet (40 mg total) by mouth daily. In am D/c hctz/telmisartan combo pill 90 tablet 1  . gabapentin (NEURONTIN) 600 MG  tablet Take 1,200 mg by mouth 3 (three) times daily.    . hydrOXYzine (VISTARIL) 25 MG capsule Take 1 capsule (25 mg total) by mouth every 8 (eight) hours as needed for anxiety. 90 capsule 1  . ipratropium (ATROVENT) 0.06 % nasal spray Place 2 sprays into both nostrils 3 (three) times daily.  prn 15 mL 12  . letrozole (FEMARA) 2.5 MG tablet Take 1 tablet (2.5 mg total) by mouth daily. 90 tablet 3  . metoprolol succinate (TOPROL-XL) 25 MG 24 hr tablet Take 1 tablet (25 mg total) by mouth daily. 90 tablet 3  . montelukast (SINGULAIR) 10 MG tablet Take 1 tablet (10 mg total) by mouth at bedtime. 90 tablet 3  . Multiple Vitamins-Minerals (MULTIVITAMIN) tablet Take 1 tablet by mouth daily. 90 tablet 3  . nicotine (NICODERM CQ - DOSED IN MG/24 HOURS) 14 mg/24hr patch Place 1 patch (14 mg total) onto the skin daily. 60 patch 0  . nicotine (NICODERM CQ - DOSED IN MG/24 HOURS) 21 mg/24hr patch Place 1 patch (21 mg total) onto the skin daily. X 2 months 14 mg x 2 months, 7 mg x 2 months 60 patch 0  . nicotine (NICODERM CQ - DOSED IN MG/24 HR) 7 mg/24hr patch Place 1 patch (7 mg total) onto the skin daily. 60 patch 0  . omeprazole (PRILOSEC) 20 MG capsule     . oxymorphone (OPANA) 10 MG tablet Take 10 mg by mouth 2 (two) times daily.     Marland Kitchen senna-docusate (SENOKOT-S) 8.6-50 MG tablet Take 1-2 tablets by mouth daily as needed for mild constipation. 180 tablet 3  . telmisartan (MICARDIS) 80 MG tablet Take 1 tablet (80 mg total) by mouth daily. 90 tablet 3  . Tiotropium Bromide-Olodaterol (STIOLTO RESPIMAT) 2.5-2.5 MCG/ACT AERS Inhale 2 puffs into the lungs daily. 4 g 11  . topiramate (TOPAMAX) 25 MG tablet TAKE 1 TABLET BY MOUTH DAILY. FOR WEIGHTGAIN SIDE EFFECT OF ABILIFY 30 tablet 1   No current facility-administered medications for this visit.    PHYSICAL EXAMINATION: ECOG PERFORMANCE STATUS: 1 - Symptomatic but completely ambulatory  There were no vitals filed for this visit. There were no vitals filed for this visit.     LABORATORY DATA:  I have reviewed the data as listed CMP Latest Ref Rng & Units 08/25/2019 04/03/2019 02/27/2019  Glucose 70 - 99 mg/dL 102(H) 110(H) 102(H)  BUN 6 - 23 mg/dL 13 6 8   Creatinine 0.40 - 1.20 mg/dL 0.67 0.50 0.55  Sodium 135 - 145 mEq/L 139 133(L) 133(L)   Potassium 3.5 - 5.1 mEq/L 3.8 4.2 3.6  Chloride 96 - 112 mEq/L 102 94(L) 93(L)  CO2 19 - 32 mEq/L 26 31 28   Calcium 8.4 - 10.5 mg/dL 10.0 10.1 10.0  Total Protein 6.0 - 8.3 g/dL 7.2 7.4 8.0  Total Bilirubin 0.2 - 1.2 mg/dL 0.5 0.5 0.6  Alkaline Phos 39 - 117 U/L 97 115 147(H)  AST 0 - 37 U/L 15 18 16   ALT 0 - 35 U/L 15 13 12     Lab Results  Component Value Date   WBC 10.2 08/25/2019   HGB 13.2 08/25/2019   HCT 39.2 08/25/2019   MCV 94.7 08/25/2019   PLT 276.0 08/25/2019   NEUTROABS 8.1 (H) 08/25/2019    ASSESSMENT & PLAN:  Malignant neoplasm of lower-outer quadrant of left breast of female, estrogen receptor positive ([place]) 04/12/2016 Left breast biopsy/ lumpectomy: IDC grade 10.8 cm with DCIS grade 1, margins negative, ER 100%,  PR 100%, HER-2 negative, Ki-67 15%  PET/CT scan: Negative for metastatic disease Sentinel lymph node study: 1/4 sentinel nodes positive with extracapsular extension  Mammaprint low-risk: 10 year risk of recurrence 10% Adjuvant radiation therapy 07/24/2016- 09/07/2016  Treatment plan: Adjuvant antiestrogen therapy with anastrozole 1 mg daily 7 yearsswitched to letrozole December 2018  Letrozole toxicities:Very occasional hot flashes. Denies any major trouble from this.  Abdominal weight: Encouraged her to do abdominal crunches and exercise regularly.  Breast cancer surveillance: 1.Breast exam 09/07/2019: Benign  2.mammogram  06/08/2019: Benign breast density category C 3.CT chest 05/14/2017: Postradiation changes in the anterior left upper lobe  Her daughter passed away in 2017-04-13 and she is still grieving from that. She is getting some help to clean up her daughter's room so that she can get through and start adjusting. She lives alone and I encouraged her to exercise and stay strong. Return to clinic in1 year for follow-up    No orders of the defined types were placed in this encounter.  The patient has a good understanding of the  overall plan. she agrees with it. she will call with any problems that may develop before the next visit here.  Total time spent: 20 mins including face to face time and time spent for planning, charting and coordination of care  Nicholas Lose, MD 09/07/2019  I, Cloyde Reams Dorshimer, am acting as scribe for Dr. Nicholas Lose.  I have reviewed the above documentation for accuracy and completeness, and I agree with the above.

## 2019-09-07 ENCOUNTER — Inpatient Hospital Stay: Payer: PPO | Attending: Hematology and Oncology | Admitting: Hematology and Oncology

## 2019-09-07 DIAGNOSIS — C50512 Malignant neoplasm of lower-outer quadrant of left female breast: Secondary | ICD-10-CM | POA: Diagnosis not present

## 2019-09-07 DIAGNOSIS — Z170 Estrogen receptor positive status [ER+]: Secondary | ICD-10-CM

## 2019-09-07 MED ORDER — LETROZOLE 2.5 MG PO TABS: 2.5000 mg | ORAL_TABLET | Freq: Every day | ORAL | 3 refills | Status: DC

## 2019-09-07 NOTE — Assessment & Plan Note (Signed)
04/12/2016 Left breast biopsy/ lumpectomy: IDC grade 10.8 cm with DCIS grade 1, margins negative, ER 100%, PR 100%, HER-2 negative, Ki-67 15%  PET/CT scan: Negative for metastatic disease Sentinel lymph node study: 1/4 sentinel nodes positive with extracapsular extension  Mammaprint low-risk: 10 year risk of recurrence 10% Adjuvant radiation therapy 07/24/2016- 09/07/2016  Treatment plan: Adjuvant antiestrogen therapy with anastrozole 1 mg daily 7 yearsswitched to letrozole December 2018  Letrozole toxicities:Very occasional hot flashes. Denies any major trouble from this.   Breast cancer surveillance: 1.Breast exam 09/07/2019: Benign  2.mammogram  06/08/2019: Benign breast density category C 3.CT chest 05/14/2017: Postradiation changes in the anterior left upper lobe  Grieving from trauma of her daughters passing (from OD) Return to clinic in1 year for follow-up

## 2019-09-09 ENCOUNTER — Telehealth: Payer: Self-pay | Admitting: Hematology and Oncology

## 2019-09-09 NOTE — Telephone Encounter (Signed)
Scheduled per 8/2 los. Called and left a msg, mailing appt letter and calendar printout

## 2019-09-14 ENCOUNTER — Ambulatory Visit: Payer: PPO | Admitting: Licensed Clinical Social Worker

## 2019-09-15 ENCOUNTER — Encounter: Payer: Self-pay | Admitting: Psychiatry

## 2019-09-15 ENCOUNTER — Telehealth (INDEPENDENT_AMBULATORY_CARE_PROVIDER_SITE_OTHER): Payer: PPO | Admitting: Psychiatry

## 2019-09-15 ENCOUNTER — Telehealth: Payer: PPO

## 2019-09-15 ENCOUNTER — Other Ambulatory Visit: Payer: Self-pay

## 2019-09-15 DIAGNOSIS — F17200 Nicotine dependence, unspecified, uncomplicated: Secondary | ICD-10-CM

## 2019-09-15 DIAGNOSIS — Z9189 Other specified personal risk factors, not elsewhere classified: Secondary | ICD-10-CM | POA: Diagnosis not present

## 2019-09-15 DIAGNOSIS — F331 Major depressive disorder, recurrent, moderate: Secondary | ICD-10-CM

## 2019-09-15 DIAGNOSIS — Z79899 Other long term (current) drug therapy: Secondary | ICD-10-CM | POA: Insufficient documentation

## 2019-09-15 DIAGNOSIS — F1010 Alcohol abuse, uncomplicated: Secondary | ICD-10-CM | POA: Diagnosis not present

## 2019-09-15 MED ORDER — BUPROPION HCL ER (XL) 150 MG PO TB24: 150.0000 mg | ORAL_TABLET | Freq: Every day | ORAL | 0 refills | Status: DC

## 2019-09-15 MED ORDER — ARIPIPRAZOLE 10 MG PO TABS: 10.0000 mg | ORAL_TABLET | Freq: Every day | ORAL | 1 refills | Status: DC

## 2019-09-15 MED ORDER — ARIPIPRAZOLE 10 MG PO TABS: 5.0000 mg | ORAL_TABLET | Freq: Every day | ORAL | 1 refills | Status: DC

## 2019-09-15 NOTE — Progress Notes (Signed)
Provider Location : [facility] Patient Location : Home  Participants: Patient , Provider   Virtual Visit via Video Note  I connected with Ashley Cross on 09/15/19 at  4:30 PM EDT by a video enabled telemedicine application and verified that I am speaking with the correct person using two identifiers.   I discussed the limitations of evaluation and management by telemedicine and the availability of in person appointments. The patient expressed understanding and agreed to proceed.    I discussed the assessment and treatment plan with the patient. The patient was provided an opportunity to ask questions and all were answered. The patient agreed with the plan and demonstrated an understanding of the instructions.   The patient was advised to call back or seek an in-person evaluation if the symptoms worsen or if the condition fails to improve as anticipated.   [facility] MD OP Progress Note  09/15/2019 5:06 PM Ashley Cross  MRN:  [id number]  Chief Complaint:  Chief Complaint    Follow-up     HPI: Ashley Cross is a 64 year old Caucasian female on disability, lives in [place], has a history of MDD, alcohol use disorder, history of breast cancer, chronic pain, hypertension was evaluated by telemedicine today.  Patient today reports she continues to have mild depressive symptoms.  She denies any significant mood swings or significant sadness.  She however reports she continues to struggle with lack of motivation.  She reports she is compliant on the medications as prescribed, the Abilify and the Cymbalta.  She continues to struggle with possible side effects of Abilify.  She reports ever since increasing the dosage of Abilify she has been struggling with craving for food like ice cream, frozen yogurt.  She reports she can sit home and eat frozen yogurt and ice cream all day.  This has been frustrating for her.  She had similar complaints with the Abilify in the past however when she  stopped taking the Abilify she had severe depressive symptoms and at that time had contacted writer and requested to be placed back on the Abilify.    Patient reports sleep is good.  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  She reports that does keep her busy 3 times a week and she enjoys it.  Patient denies any suicidality, homicidality or perceptual disturbances.  Patient reports she has started following up with therapist at the clinic and reports therapy sessions as beneficial.  She is currently working on cutting back on smoking.  She does have nicotine patches available.  Patient denies any other concerns today.     Visit Diagnosis:    ICD-10-CM   1. MDD (major depressive disorder), recurrent episode, moderate (HCC)  F33.1 buPROPion (WELLBUTRIN XL) 150 MG 24 hr tablet    ARIPiprazole (ABILIFY) 10 MG tablet  2. Alcohol use disorder, mild, abuse  F10.10   3. Tobacco use disorder  F17.200   4. At risk for prolonged QT interval syndrome  Z91.89 EKG 12-Lead    Past Psychiatric History: I have reviewed past psychiatric history from my progress note on 09/16/2018.  Past trials of Zoloft, Wellbutrin, Lexapro, Celexa  Past Medical History:  Past Medical History:  Diagnosis Date  . Anxiety   . Breast cancer ([place])    Left breast(nipple mass) 2018  . Cervical stenosis of spine   . Chicken pox   . Chronic back pain   . Colon polyps   .  COPD (chronic obstructive pulmonary disease) ([place])   . Cyst of left nipple   . Depression   . Hypertension   . Insomnia   . Personal history of radiation therapy     Past Surgical History:  Procedure Laterality Date  . BACK SURGERY     x 3 (05-05-08 x 2; 2011-05-06 x 1)   . BREAST BIOPSY Left    core- neg  . BREAST LUMPECTOMY Left 05-May-2016  . BREAST SURGERY Left    breast biopsy  . CERVICAL CONE BIOPSY    . CERVICAL FUSION     times 2  . COLONOSCOPY WITH PROPOFOL N/A 04/18/2015   Procedure:  COLONOSCOPY WITH PROPOFOL;  Surgeon: [name], MD;  Location: [facility] END[facility]COPY;  Service: Endoscopy;  Laterality: N/A;  . ELBOW SURGERY     05/05/2012 b/l elbows per pt ulnar nerve   . ELBOW SURGERY     x2   . EPIDURAL BLOCK INJECTION     Dr. Maryjean Ka   . MASS EXCISION Left 04/12/2016   Procedure: EXCISION OF LEFT NIPPLE MASS;  Surgeon: Stark Klein, MD;  Location: [place];  Service: General;  Laterality: Left;  . NECK SURGERY    . POSTERIOR CERVICAL FUSION/FORAMINOTOMY  03/07/2011   Procedure: POSTERIOR CERVICAL FUSION/FORAMINOTOMY LEVEL 4;  Surgeon: Eustace Moore, MD;  Location: [facility];  Service: Neurosurgery;  Laterality: N/A;  cervical three to thoracic one posterior cervical fusion   . SENTINEL NODE BIOPSY Left 05/29/2016   Procedure: LEFT SENTINEL LYMPH NODE BIOPSY;  Surgeon: Stark Klein, MD;  Location: [facility];  Service: General;  Laterality: Left;  . TONSILLECTOMY      Family Psychiatric History: I have reviewed family psychiatric history from my progress note on 09/16/2018  Family History:  Family History  Problem Relation Age of Onset  . Breast cancer Mother 27  . Hypertension Mother   . Cancer Mother        breast dx'ed 5   . Cancer Father        prostate  . Hypertension Father   . Drug abuse Daughter   . Hypertension Maternal Grandmother   . Breast cancer Cousin     Social History: Reviewed social history from my progress note on 09/16/2018 Social History   Socioeconomic History  . Marital status: Divorced    Spouse name: Not on file  . Number of children: 2  . Years of education: Not on file  . Highest education level: Not on file  Occupational History  . Not on file  Tobacco Use  . Smoking status: Current Every Day Smoker    Packs/day: 0.75    Years: 30.00    Pack years: 22.50    Types: Cigarettes  . Smokeless tobacco: Never Used  . Tobacco comment: currently 5 cigarettes per day reported 06/22/2019  Vaping Use  . Vaping Use:  Every day  Substance and Sexual Activity  . Alcohol use: Yes    Comment: social  . Drug use: No    Comment: on opana since jan 2018  . Sexual activity: Not on file  Other Topics Concern  . Not on file  Social History Narrative   As of 01/25/17 not sexually active of in relationship    Divorced   2 kids son and daughter (though daughter died of suicide in 2017/05/05)   Son lives in [place] now as of 12/2018       Disability    Loves animals  has 3 dogs           Social Determinants of Health   Financial Resource Strain: Medium Risk  . Difficulty of Paying Living Expenses: Somewhat hard  Food Insecurity:   . Worried About Charity fundraiser in the Last Year:   . Arboriculturist in the Last Year:   Transportation Needs:   . Film/video editor (Medical):   Marland Kitchen Lack of Transportation (Non-Medical):   Physical Activity:   . Days of Exercise per Week:   . Minutes of Exercise per Session:   Stress:   . Feeling of Stress :   Social Connections:   . Frequency of Communication with Friends and Family:   . Frequency of Social Gatherings with Friends and Family:   . Attends Religious Services:   . Active Member of Clubs or Organizations:   . Attends Archivist Meetings:   Marland Kitchen Marital Status:     Allergies:  Allergies  Allergen Reactions  . Norvasc [Amlodipine]     Swelling   . Sulfa Antibiotics Itching and Rash  . Sulfonamide Derivatives Itching and Rash    Metabolic Disorder Labs: Lab Results  Component Value Date   HGBA1C 5.1 02/27/2019   No results found for: PROLACTIN Lab Results  Component Value Date   CHOL 203 (H) 08/25/2019   TRIG 77.0 08/25/2019   HDL 102.30 08/25/2019   CHOLHDL 2 08/25/2019   VLDL 15.4 08/25/2019   LDLCALC 85 08/25/2019   LDLCALC 74 02/27/2019   Lab Results  Component Value Date   TSH 0.71 08/25/2019   TSH 2.91 04/03/2019    Therapeutic Level Labs: No results found for: LITHIUM No results found for: VALPROATE No  components found for:  CBMZ  Current Medications: Current Outpatient Medications  Medication Sig Dispense Refill  . albuterol (VENTOLIN HFA) 108 (90 Base) MCG/ACT inhaler Inhale 1-2 puffs into the lungs every 4 (four) hours as needed for wheezing or shortness of breath. 18 g 12  . amoxicillin-clavulanate (AUGMENTIN) 875-125 MG tablet Take 1 tablet by mouth 2 (two) times daily. With food 14 tablet 0  . ARIPiprazole (ABILIFY) 10 MG tablet Take 0.5 tablets (5 mg total) by mouth daily. 15 tablet 1  . azelastine (ASTELIN) 0.1 % nasal spray     . buPROPion (WELLBUTRIN XL) 300 MG 24 hr tablet Take 1 tablet (300 mg total) by mouth daily. Further refills psych Dr. Shea Evans 90 tablet 1  . CHANTIX STARTING MONTH PAK 0.5 MG X 11 & 1 MG X 42 tablet See admin instructions.    . DULoxetine (CYMBALTA) 30 MG capsule Take 3 capsules (90 mg total) by mouth daily. 270 capsule 0  . eszopiclone (LUNESTA) 1 MG TABS tablet TAKE ONE TABLET BY MOUTH IMMEDIATELY BEFORE BEDTIME. 30 tablet 0  . fluticasone (FLONASE) 50 MCG/ACT nasal spray Place 2 sprays into both nostrils daily. Max 2 sprays 16 g 11  . furosemide (LASIX) 40 MG tablet Take 1 tablet (40 mg total) by mouth daily. In am D/c hctz/telmisartan combo pill 90 tablet 1  . gabapentin (NEURONTIN) 600 MG tablet Take 1,200 mg by mouth 3 (three) times daily.    . hydrOXYzine (VISTARIL) 25 MG capsule Take 1 capsule (25 mg total) by mouth every 8 (eight) hours as needed for anxiety. 90 capsule 1  . ipratropium (ATROVENT) 0.06 % nasal spray Place 2 sprays into both nostrils 3 (three) times daily. prn 15 mL 12  . letrozole (FEMARA) 2.5 MG tablet  Take 1 tablet (2.5 mg total) by mouth daily. 90 tablet 3  . metoprolol succinate (TOPROL-XL) 25 MG 24 hr tablet Take 1 tablet (25 mg total) by mouth daily. 90 tablet 3  . montelukast (SINGULAIR) 10 MG tablet Take 1 tablet (10 mg total) by mouth at bedtime. 90 tablet 3  . Multiple Vitamins-Minerals (MULTIVITAMIN) tablet Take 1 tablet by  mouth daily. 90 tablet 3  . omeprazole (PRILOSEC) 20 MG capsule     . oxymorphone (OPANA) 10 MG tablet Take 10 mg by mouth 2 (two) times daily.     Marland Kitchen senna-docusate (SENOKOT-S) 8.6-50 MG tablet Take 1-2 tablets by mouth daily as needed for mild constipation. 180 tablet 3  . telmisartan (MICARDIS) 80 MG tablet Take 1 tablet (80 mg total) by mouth daily. 90 tablet 3  . Tiotropium Bromide-Olodaterol (STIOLTO RESPIMAT) 2.5-2.5 MCG/ACT AERS Inhale 2 puffs into the lungs daily. 4 g 11  . topiramate (TOPAMAX) 25 MG tablet TAKE 1 TABLET BY MOUTH DAILY. FOR WEIGHTGAIN SIDE EFFECT OF ABILIFY 30 tablet 1  . buPROPion (WELLBUTRIN XL) 150 MG 24 hr tablet Take 1 tablet (150 mg total) by mouth daily. To be combined with 300 mg daily in the AM 90 tablet 0  . nicotine (NICODERM CQ - DOSED IN MG/24 HOURS) 14 mg/24hr patch Place 1 patch (14 mg total) onto the skin daily. (Patient not taking: Reported on 09/15/2019) 60 patch 0  . nicotine (NICODERM CQ - DOSED IN MG/24 HOURS) 21 mg/24hr patch Place 1 patch (21 mg total) onto the skin daily. X 2 months 14 mg x 2 months, 7 mg x 2 months (Patient not taking: Reported on 09/15/2019) 60 patch 0  . nicotine (NICODERM CQ - DOSED IN MG/24 HR) 7 mg/24hr patch Place 1 patch (7 mg total) onto the skin daily. (Patient not taking: Reported on 09/15/2019) 60 patch 0   No current facility-administered medications for this visit.     Musculoskeletal: Strength & Muscle Tone: UTA Gait & Station: normal Patient leans: N/A  Psychiatric Specialty Exam: Review of Systems  Musculoskeletal: Positive for myalgias.  Psychiatric/Behavioral:       Lack of motivation  All other systems reviewed and are negative.   There were no vitals taken for this visit.There is no height or weight on file to calculate BMI.  General Appearance: Casual  Eye Contact:  Fair  Speech:  Clear and Coherent  Volume:  Normal  Mood:  Reports lack of motivation, denies significant sadness  Affect:  Congruent   Thought Process:  Goal Directed and Descriptions of Associations: Intact  Orientation:  Full (Time, Place, and Person)  Thought Content: Logical   Suicidal Thoughts:  No  Homicidal Thoughts:  No  Memory:  Immediate;   Fair Recent;   Fair Remote;   Fair  Judgement:  Fair  Insight:  Fair  Psychomotor Activity:  Normal  Concentration:  Concentration: Fair and Attention Span: Fair  Recall:  [employer] of Knowledge: Fair  Language: Fair  Akathisia:  No  Handed:  Right  AIMS (if indicated): uta  Assets:  Communication Skills Desire for Improvement Housing Social Support  ADL's:  Intact  Cognition: WNL  Sleep:  Fair   Screenings: GAD-7     Office Visit from 06/12/2019 in [facility] from 09/30/2017 in [facility]  Total GAD-7 Score 18 18    PHQ2-9     Office Visit from 06/12/2019 in [facility]  Visit from 12/31/2018 in [facility] Office Visit from 09/16/2018 in [facility] Visit from 09/30/2017 in [facility] Office Visit from 08/19/2017 in [place]  PHQ-2 Total Score 4 0 0 4 4  PHQ-9 Total Score 17 -- -- 18 16       Assessment and Plan: Ashley Cross is a 64 year old Caucasian female, divorced, disabled, has a history of depression, chronic pain, hypertension, history of breast cancer was evaluated by telemedicine today.  Patient is biologically predisposed given her family history of substance abuse problems.  Patient with psychosocial stressors of health issues, financial problems, death of her daughter almost 2 years ago.  Patient is currently struggling with lack of motivation, as well as possible side effects to Abilify.  Discussed plan as noted below.  Plan MDD-some progress Cymbalta 90 mg p.o. daily Increase Wellbutrin to extended release 450 mg p.o. daily Reduce Abilify to 5 mg p.o. daily due  to side effects. Discussed starting a medication like Geodon to replace the Abilify.  However discussed with patient that an EKG needs to be completed before the Geodon can be started.  She will talk to her primary care provider to get an EKG done. Continue Topamax 25 mg p.o. daily for weight gain side effects of Abilify.  It is also likely that Topamax could also be contributing to some of her lack of motivation.  Once she is off of the Abilify the Topamax can be discontinued. Lunesta 1 mg p.o. nightly for sleep Hydroxyzine 25 mg p.o. daily as needed for severe anxiety.  Alcohol use disorder mild-improving She will continue to cut back  Tobacco use disorder-improving Provided smoking cessation counseling She has nicotine patches available.  High risk medication use-will order EKG. Once she gets the EKG done, will consider discontinuing Abilify and starting patient on Geodon.  Patient to continue to follow-up with therapist for CBT  Follow-up in clinic in 6 weeks or sooner if needed.   I have spent atleast 30 minutes non face to face with patient today. More than 50 % of the time was spent for preparing to see the patient ( e.g., review of test, records ), ordering medications and test ,psychoeducation and supportive psychotherapy and care coordination,as well as documenting clinical information in electronic health record,interpreting results of test and communication of results This note was generated in part or whole with voice recognition software. Voice recognition is usually quite accurate but there are transcription errors that can and very often do occur. I apologize for any typographical errors that were not detected and corrected.     Ursula Alert, MD 09/15/2019, 5:06 PM

## 2019-09-16 ENCOUNTER — Other Ambulatory Visit: Payer: Self-pay | Admitting: Internal Medicine

## 2019-09-16 DIAGNOSIS — K5900 Constipation, unspecified: Secondary | ICD-10-CM

## 2019-09-16 MED ORDER — SENNOSIDES-DOCUSATE SODIUM 8.6-50 MG PO TABS
1.0000 | ORAL_TABLET | Freq: Every day | ORAL | 1 refills | Status: DC | PRN
Start: 2019-09-16 — End: 2020-04-26

## 2019-09-21 ENCOUNTER — Telehealth: Payer: Self-pay | Admitting: Internal Medicine

## 2019-09-21 NOTE — Telephone Encounter (Signed)
Pt said she needs an appointment for an EKG. Dr. Shea Evans told her they need the results in order to adjust her medication. Am I ok to schedule or do there need to be an order in place before scheduling?

## 2019-09-21 NOTE — Telephone Encounter (Signed)
Pt scheduled for 09/23/19 @ 2:30.

## 2019-09-21 NOTE — Telephone Encounter (Signed)
Yes ok to schedule in person for EKG even if not with me if I am out of town

## 2019-09-21 NOTE — Telephone Encounter (Signed)
Left message to return call. Please schedule patient for anyone's next available appointment if calling back in.

## 2019-09-21 NOTE — Telephone Encounter (Signed)
According to Dr Shea Evans, "Discussed starting a medication like Geodon to replace the Abilify.  However discussed with patient that an EKG needs to be completed before the Geodon can be started."  Okay to schedule patient for in person office visit?

## 2019-09-22 ENCOUNTER — Other Ambulatory Visit: Payer: Self-pay

## 2019-09-22 ENCOUNTER — Ambulatory Visit (INDEPENDENT_AMBULATORY_CARE_PROVIDER_SITE_OTHER): Payer: PPO | Admitting: Licensed Clinical Social Worker

## 2019-09-22 DIAGNOSIS — F331 Major depressive disorder, recurrent, moderate: Secondary | ICD-10-CM | POA: Diagnosis not present

## 2019-09-22 DIAGNOSIS — M5416 Radiculopathy, lumbar region: Secondary | ICD-10-CM | POA: Diagnosis not present

## 2019-09-22 DIAGNOSIS — M961 Postlaminectomy syndrome, not elsewhere classified: Secondary | ICD-10-CM | POA: Diagnosis not present

## 2019-09-22 DIAGNOSIS — M47812 Spondylosis without myelopathy or radiculopathy, cervical region: Secondary | ICD-10-CM | POA: Diagnosis not present

## 2019-09-22 DIAGNOSIS — G894 Chronic pain syndrome: Secondary | ICD-10-CM | POA: Diagnosis not present

## 2019-09-22 NOTE — Progress Notes (Signed)
Virtual Visit via Video Note  I connected with Ashley Cross on 09/22/19 at  9:00 AM EDT by a video enabled telemedicine application and verified that I am speaking with the correct person using two identifiers.  Location: Patient: home Provider: [facility]   I discussed the limitations of evaluation and management by telemedicine and the availability of in person appointments. The patient expressed understanding and agreed to proceed.   The patient was advised to call back or seek an in-person evaluation if the symptoms worsen or if the condition fails to improve as anticipated.  I provided 45 minutes of non-face-to-face time during this encounter.   Malerie Cross R Monti Jilek, LCSW    THERAPIST PROGRESS NOTE  Session Time: 9:00-9:45 am  Participation Level: Active  Behavioral Response: Neat and Well GroomedAlertDepressed  Type of Therapy: Individual Therapy  Treatment Goals addressed: Coping  Interventions: Supportive and Other: grief counseling  Summary: Ashley Cross is a 64 y.o. female who presents with improving symptoms related to diagnosis of depression.   Allowed pt to explore and express thoughts and feelings about current state of grief.  Pt feels she is managing better and has decided she is ready to paint her daughter's room.  Pt is happy that two friends have offered to come and help her .   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   Discussed pts pets: birds, dogs, cats. Pt has one dog that is going for laser treatment on legs and another that has some skin allergies. Pt worried about the continuing care that the animals need.   Pt concerned about weight gain from abilify--spoke with MD about changing medication and is currently in the process of making medication shift. Encouraged pt to be as active as possible to help pt feel  better about self. Discussed preferred activities and small changes that pt could make to become more active throughout the day.    Suicidal/Homicidal: No  Therapist Response: Ashley Cross was actively engaged throughout session. Ashley Cross reports improvement in depression symptoms and feels that she is managing anxiety better. Pt rates mood 3 on scale of 1-10 (10 best). Pt reports that 3 is big improvement over how she would have rated mood in the past. Pt-rated improvement in symptoms is indicative of continuing progress.   Plan: Return again in 3 weeks. Ongoing treatment plan to include continuing focus on mood management, self care, grief counseling, stress/anxiety management, and focus on overall well being  Diagnosis: Axis I: Bereavement and Major Depression, Recurrent moderate    Axis II: No diagnosis    Ashley Cross Ashley Schlottman, LCSW 09/22/2019

## 2019-09-23 ENCOUNTER — Ambulatory Visit (INDEPENDENT_AMBULATORY_CARE_PROVIDER_SITE_OTHER): Payer: PPO | Admitting: Internal Medicine

## 2019-09-23 ENCOUNTER — Other Ambulatory Visit: Payer: Self-pay

## 2019-09-23 ENCOUNTER — Encounter: Payer: Self-pay | Admitting: Internal Medicine

## 2019-09-23 VITALS — BP 112/70 | HR 86 | Temp 98.2°F | Ht 64.17 in | Wt 131.4 lb

## 2019-09-23 DIAGNOSIS — R7303 Prediabetes: Secondary | ICD-10-CM

## 2019-09-23 DIAGNOSIS — I10 Essential (primary) hypertension: Secondary | ICD-10-CM | POA: Diagnosis not present

## 2019-09-23 DIAGNOSIS — Z9189 Other specified personal risk factors, not elsewhere classified: Secondary | ICD-10-CM | POA: Diagnosis not present

## 2019-09-23 NOTE — Patient Instructions (Addendum)
06/22/2015 Office Visit [facility] [place]  [place], [place] [id number]  [phone]  Saunders Revel, MD  [address]  [id number] [employer]  [place], [place] [address]  [phone]  [phone] (Fax)  Polyp of colon   Or ask to schedule in [facility]  06/21/2020  Or 07/2020 colonoscopy due    1 800 QUIT NOW

## 2019-09-23 NOTE — Progress Notes (Addendum)
Chief Complaint  Patient presents with  . EKG need   EKG visit  1. Geodon being considered by psych Dr. Shea Evans and pt needs EKG done today NSR [facility] Psych she is on abilify still on 10 advised last psych note wanted her to reduce to 5 mg which she was not aware trying to get off this medication due to weight gain and increased appetite/cravings.  Mood is still depressed about suicide death of her daughter though she is excited to have someone come in her home and paint her daughters room and re-arrange the room   2. HTN controlled on lasix 40 and toprol 25 mg qd and  micardis 80 and these are consistent with readings at home  Review of Systems  Constitutional: Negative for weight loss.  HENT: Negative for hearing loss.   Eyes: Negative for blurred vision.  Respiratory: Negative for shortness of breath.   Cardiovascular: Negative for chest pain.  Musculoskeletal: Negative for falls.  Skin: Negative for rash.  Psychiatric/Behavioral: Positive for depression.   Past Medical History:  Diagnosis Date  . Anxiety   . Breast cancer ([place])    Left breast(nipple mass) 2018  . Cervical stenosis of spine   . Chicken pox   . Chronic back pain   . Colon polyps   . COPD (chronic obstructive pulmonary disease) ([place])   . Cyst of left nipple   . Depression   . Hypertension   . Insomnia   . Personal history of radiation therapy    Past Surgical History:  Procedure Laterality Date  . BACK SURGERY     x 3 (2010 x 2; 2013 x 1)   . BREAST BIOPSY Left    core- neg  . BREAST LUMPECTOMY Left 2018  . BREAST SURGERY Left    breast biopsy  . CERVICAL CONE BIOPSY    . CERVICAL FUSION     times 2  . COLONOSCOPY WITH PROPOFOL N/A 04/18/2015   Procedure: COLONOSCOPY WITH PROPOFOL;  Surgeon: [name], MD;  Location: [facility] ENDOSCOPY;  Service: Endoscopy;  Laterality: N/A;  . ELBOW SURGERY     2014 b/l elbows per pt ulnar nerve   . ELBOW SURGERY     x2   . EPIDURAL BLOCK INJECTION     Dr.  Maryjean Ka   . MASS EXCISION Left 04/12/2016   Procedure: EXCISION OF LEFT NIPPLE MASS;  Surgeon: Stark Klein, MD;  Location: [facility];  Service: General;  Laterality: Left;  . NECK SURGERY    . POSTERIOR CERVICAL FUSION/FORAMINOTOMY  03/07/2011   Procedure: POSTERIOR CERVICAL FUSION/FORAMINOTOMY LEVEL 4;  Surgeon: Eustace Moore, MD;  Location: [facility] NEURO ORS;  Service: Neurosurgery;  Laterality: N/A;  cervical three to thoracic one posterior cervical fusion   . SENTINEL NODE BIOPSY Left 05/29/2016   Procedure: LEFT SENTINEL LYMPH NODE BIOPSY;  Surgeon: Stark Klein, MD;  Location: [facility];  Service: General;  Laterality: Left;  . TONSILLECTOMY     Family History  Problem Relation Age of Onset  . Breast cancer Mother 33  . Hypertension Mother   . Cancer Mother        breast dx'ed 39   . Cancer Father        prostate  . Hypertension Father   . Drug abuse Daughter   . Hypertension Maternal Grandmother   . Breast cancer Cousin    Social History   Socioeconomic History  . Marital status: Divorced    Spouse name: Not  on file  . Number of children: 2  . Years of education: Not on file  . Highest education level: Not on file  Occupational History  . Not on file  Tobacco Use  . Smoking status: Current Every Day Smoker    Packs/day: 0.75    Years: 30.00    Pack years: 22.50    Types: Cigarettes  . Smokeless tobacco: Never Used  . Tobacco comment: currently 5 cigarettes per day reported 06/22/2019  Vaping Use  . Vaping Use: Every day  Substance and Sexual Activity  . Alcohol use: Yes    Comment: social  . Drug use: No    Comment: on opana since jan 2018  . Sexual activity: Not on file  Other Topics Concern  . Not on file  Social History Narrative   As of 01/25/17 not sexually active of in relationship    Divorced   2 kids son and daughter (though daughter died of suicide in May 11, 2017)   Son lives in [place] now as of 12/2018       Disability    Loves animals has 3  dogs           Social Determinants of Radio broadcast assistant Strain: Medium Risk  . Difficulty of Paying Living Expenses: Somewhat hard  Food Insecurity:   . Worried About Charity fundraiser in the Last Year: Not on file  . Ran Out of Food in the Last Year: Not on file  Transportation Needs:   . Lack of Transportation (Medical): Not on file  . Lack of Transportation (Non-Medical): Not on file  Physical Activity:   . Days of Exercise per Week: Not on file  . Minutes of Exercise per Session: Not on file  Stress:   . Feeling of Stress : Not on file  Social Connections:   . Frequency of Communication with Friends and Family: Not on file  . Frequency of Social Gatherings with Friends and Family: Not on file  . Attends Religious Services: Not on file  . Active Member of Clubs or Organizations: Not on file  . Attends Archivist Meetings: Not on file  . Marital Status: Not on file  Intimate Partner Violence:   . Fear of Current or Ex-Partner: Not on file  . Emotionally Abused: Not on file  . Physically Abused: Not on file  . Sexually Abused: Not on file   Current Meds  Medication Sig  . albuterol (VENTOLIN HFA) 108 (90 Base) MCG/ACT inhaler Inhale 1-2 puffs into the lungs every 4 (four) hours as needed for wheezing or shortness of breath.  Marland Kitchen amoxicillin-clavulanate (AUGMENTIN) 875-125 MG tablet Take 1 tablet by mouth 2 (two) times daily. With food  . ARIPiprazole (ABILIFY) 10 MG tablet Take 0.5 tablets (5 mg total) by mouth daily.  Marland Kitchen azelastine (ASTELIN) 0.1 % nasal spray   . buPROPion (WELLBUTRIN XL) 150 MG 24 hr tablet Take 1 tablet (150 mg total) by mouth daily. To be combined with 300 mg daily in the AM  . buPROPion (WELLBUTRIN XL) 300 MG 24 hr tablet Take 1 tablet (300 mg total) by mouth daily. Further refills psych Dr. Shea Evans  . CHANTIX STARTING MONTH PAK 0.5 MG X 11 & 1 MG X 42 tablet See admin instructions.  . DULoxetine (CYMBALTA) 30 MG capsule Take 3  capsules (90 mg total) by mouth daily.  . eszopiclone (LUNESTA) 1 MG TABS tablet TAKE ONE TABLET BY MOUTH IMMEDIATELY BEFORE BEDTIME.  Marland Kitchen  fluticasone (FLONASE) 50 MCG/ACT nasal spray Place 2 sprays into both nostrils daily. Max 2 sprays  . furosemide (LASIX) 40 MG tablet Take 1 tablet (40 mg total) by mouth daily. In am D/c hctz/telmisartan combo pill  . gabapentin (NEURONTIN) 600 MG tablet Take 1,200 mg by mouth 3 (three) times daily.  . hydrOXYzine (VISTARIL) 25 MG capsule Take 1 capsule (25 mg total) by mouth every 8 (eight) hours as needed for anxiety.  Marland Kitchen ipratropium (ATROVENT) 0.06 % nasal spray Place 2 sprays into both nostrils 3 (three) times daily. prn  . letrozole (FEMARA) 2.5 MG tablet Take 1 tablet (2.5 mg total) by mouth daily.  . metoprolol succinate (TOPROL-XL) 25 MG 24 hr tablet Take 1 tablet (25 mg total) by mouth daily.  . montelukast (SINGULAIR) 10 MG tablet Take 1 tablet (10 mg total) by mouth at bedtime.  . Multiple Vitamins-Minerals (MULTIVITAMIN) tablet Take 1 tablet by mouth daily.  . nicotine (NICODERM CQ - DOSED IN MG/24 HOURS) 14 mg/24hr patch Place 1 patch (14 mg total) onto the skin daily.  . nicotine (NICODERM CQ - DOSED IN MG/24 HOURS) 21 mg/24hr patch Place 1 patch (21 mg total) onto the skin daily. X 2 months 14 mg x 2 months, 7 mg x 2 months  . nicotine (NICODERM CQ - DOSED IN MG/24 HR) 7 mg/24hr patch Place 1 patch (7 mg total) onto the skin daily.  Marland Kitchen omeprazole (PRILOSEC) 20 MG capsule   . oxymorphone (OPANA) 10 MG tablet Take 10 mg by mouth 2 (two) times daily.   Marland Kitchen senna-docusate (SENOKOT-S) 8.6-50 MG tablet Take 1-2 tablets by mouth daily as needed for mild constipation.  Marland Kitchen telmisartan (MICARDIS) 80 MG tablet Take 1 tablet (80 mg total) by mouth daily.  . Tiotropium Bromide-Olodaterol (STIOLTO RESPIMAT) 2.5-2.5 MCG/ACT AERS Inhale 2 puffs into the lungs daily.  Marland Kitchen topiramate (TOPAMAX) 25 MG tablet TAKE 1 TABLET BY MOUTH DAILY. FOR [facility] SIDE EFFECT OF  ABILIFY   Allergies  Allergen Reactions  . Augmentin [Amoxicillin-Pot Clavulanate]     Diarrhea   . Norvasc [Amlodipine]     Swelling   . Sulfa Antibiotics Itching and Rash  . Sulfonamide Derivatives Itching and Rash   Recent Results (from the past 2160 hour(s))  TSH     Status: None   Collection Time: 08/25/19  9:57 AM  Result Value Ref Range   TSH 0.71 0.35 - 4.50 uIU/mL  HIV antibody (with reflex)     Status: None   Collection Time: 08/25/19  9:57 AM  Result Value Ref Range   HIV 1&2 Ab, 4th Generation NON-REACTIVE NON-REACTI    Comment: HIV-1 antigen and HIV-1/HIV-2 antibodies were not detected. There is no laboratory evidence of HIV infection. Marland Kitchen PLEASE NOTE: This information has been disclosed to you from records whose confidentiality may be protected by state law.  If your state requires such protection, then the state law prohibits you from making any further disclosure of the information without the specific written consent of the person to whom it pertains, or as otherwise permitted by law. A general authorization for the release of medical or other information is NOT sufficient for this purpose. . For additional information please refer to http://education.questdiagnostics.com/faq/FAQ106 (This link is being provided for informational/ educational purposes only.) . Marland Kitchen The performance of this assay has not been clinically validated in patients less than 77 years old. Marland Kitchen   CBC with Differential/Platelet     Status: Abnormal   Collection Time: 08/25/19  9:57 AM  Result Value Ref Range   WBC 10.2 4.0 - 10.5 K/uL   RBC 4.14 3.87 - 5.11 Mil/uL   Hemoglobin 13.2 12.0 - 15.0 g/dL   HCT 39.2 36 - 46 %   MCV 94.7 78.0 - 100.0 fl   MCHC 33.7 30.0 - 36.0 g/dL   RDW 12.9 11.5 - 15.5 %   Platelets 276.0 150 - 400 K/uL   Neutrophils Relative % 80.2 (H) 43 - 77 %   Lymphocytes Relative 12.1 12 - 46 %   Monocytes Relative 7.1 3 - 12 %   Eosinophils Relative 0.1 0 - 5  %   Basophils Relative 0.5 0 - 3 %   Neutro Abs 8.1 (H) 1.4 - 7.7 K/uL   Lymphs Abs 1.2 0.7 - 4.0 K/uL   Monocytes Absolute 0.7 0 - 1 K/uL   Eosinophils Absolute 0.0 0 - 0 K/uL   Basophils Absolute 0.1 0 - 0 K/uL  Lipid panel     Status: Abnormal   Collection Time: 08/25/19  9:57 AM  Result Value Ref Range   Cholesterol 203 (H) 0 - 200 mg/dL    Comment: ATP III Classification       Desirable:  < 200 mg/dL               Borderline High:  200 - 239 mg/dL          High:  > = 240 mg/dL   Triglycerides 77.0 0 - 149 mg/dL    Comment: Normal:  <150 mg/dLBorderline High:  150 - 199 mg/dL   HDL 102.30 >39.00 mg/dL   VLDL 15.4 0.0 - 40.0 mg/dL   LDL Cholesterol 85 0 - 99 mg/dL   Total CHOL/HDL Ratio 2     Comment:                Men          Women1/2 Average Risk     3.4          3.3Average Risk          5.0          4.42X Average Risk          9.6          7.13X Average Risk          15.0          11.0                       NonHDL 100.46     Comment: NOTE:  Non-HDL goal should be 30 mg/dL higher than patient's LDL goal (i.e. LDL goal of < 70 mg/dL, would have non-HDL goal of < 100 mg/dL)  Comprehensive metabolic panel     Status: Abnormal   Collection Time: 08/25/19  9:57 AM  Result Value Ref Range   Sodium 139 135 - 145 mEq/L   Potassium 3.8 3.5 - 5.1 mEq/L   Chloride 102 96 - 112 mEq/L   CO2 26 19 - 32 mEq/L   Glucose, Bld 102 (H) 70 - 99 mg/dL   BUN 13 6 - 23 mg/dL   Creatinine, Ser 0.67 0.40 - 1.20 mg/dL   Total Bilirubin 0.5 0.2 - 1.2 mg/dL   Alkaline Phosphatase 97 39 - 117 U/L   AST 15 0 - 37 U/L   ALT 15 0 - 35 U/L   Total Protein 7.2 6.0 - 8.3 g/dL   Albumin 4.3  3.5 - 5.2 g/dL   GFR 88.84 >60.00 mL/min   Calcium 10.0 8.4 - 10.5 mg/dL   Objective  Body mass index is 22.44 kg/m. Wt Readings from Last 3 Encounters:  09/23/19 131 lb 6.4 oz (59.6 kg)  06/12/19 127 lb (57.6 kg)  12/31/18 122 lb (55.3 kg)   Temp Readings from Last 3 Encounters:  09/23/19 98.2 F (36.8 C)  (Oral)  06/12/19 97.8 F (36.6 C) (Temporal)  03/26/18 98.3 F (36.8 C) (Oral)   BP Readings from Last 3 Encounters:  09/23/19 112/70  07/16/19 112/65  06/12/19 140/82   Pulse Readings from Last 3 Encounters:  09/23/19 86  07/16/19 76  06/12/19 78    Physical Exam Vitals and nursing note reviewed.  Constitutional:      Appearance: Normal appearance. She is well-developed and well-groomed.  HENT:     Head: Normocephalic and atraumatic.  Eyes:     Conjunctiva/sclera: Conjunctivae normal.     Pupils: Pupils are equal, round, and reactive to light.  Cardiovascular:     Rate and Rhythm: Normal rate and regular rhythm.     Heart sounds: Normal heart sounds. No murmur heard.   Pulmonary:     Effort: Pulmonary effort is normal.     Breath sounds: Normal breath sounds.  Skin:    General: Skin is warm and dry.  Neurological:     General: No focal deficit present.     Mental Status: She is alert and oriented to person, place, and time. Mental status is at baseline.     Gait: Gait normal.  Psychiatric:        Attention and Perception: Attention and perception normal.        Mood and Affect: Mood is depressed.        Speech: Speech normal.        Behavior: Behavior normal. Behavior is cooperative.        Thought Content: Thought content normal.        Cognition and Memory: Cognition and memory normal.        Judgment: Judgment normal.     Assessment  Plan  Essential hypertension At risk for prolonged QT interval syndrome - Plan: EKG 12-Lead NSR cc'ed psych  Cont meds BP at goal and controlled  rec smoking cessation she has called 1 800 quit now    HM Flu shot utd Tdap UTD pna 23utd prevnar will need age 73  covid shot 2/2 had  rec shingrix vaccine as wellfor future  Pap 06/12/19 neg neg HPV   breast exam 06/12/19 normal   mammo with h/o left breast cancer 04/12/16 invasive ductal ca grade 1 0.8 cm  -h/o breast cancerleft  -letrozole likely causing  sweating/hot flashes she will f/u with h/o in 08/2018 06/08/19 negative mammogram had f/u [name] 09/07/19 10 am  Dr. Barry Dienes surgery following as well  dexa 05/01/17 osteopeniarec calcium and vitamin Drepeat in 3-5 years   Colonoscopy had 04/18/15 see reporttubular adenomas repeat in 5 years [facility] 06/20/20 or 6/22 wants to go back [facility] Dr. Ivor Messier since she had a polyp removed in a difficult area on he did this  Hep C neg 01/20/16   rec smoking cessationCOPDat least 10 cig per day smoking as of 06/12/19 -07/02/19 on Stiolto per catie pharm D was on anoro but became costly and prn albuterol  Saw Dr. Midge Minium chronic neck and back pain  Allergic rhinitis Ambulatory referral to ENTDr. Tami Ribas or [facility]aught to w/u  hoarseness and allergies Hoarse - Plan: Ambulatory referral to ENTDr. Tami Ribas or Vaught further w/u Saw 08/28/18 Rx Astepro 0.1 nasal spray, omeprazole 20 mg qd, medrol 4 mg dose pak noted Reinke Edema and GERD cultures taken+fungal per pts/p 2 rounds antibiotics I.e Doxycycline may need a 3rd and consider CT neck if persists f/u in 3 weeks   Dr. Pryor Ochoa ENTf/u prn seen week of 11/16/2020and doing better as of today  H/o input labs 12/18/19 with elevated neutrophils  Agree with diet and exercise  Not worried about the WBC numbers since it may be related to Inflammation and its predominantly Neutrophils  Thanks  Linna Darner  Provider: Dr. Olivia Mackie McLean-Scoc[facility]-Internal Medicine

## 2019-09-30 ENCOUNTER — Telehealth: Payer: PPO

## 2019-09-30 ENCOUNTER — Telehealth: Payer: Self-pay | Admitting: Pharmacist

## 2019-09-30 NOTE — Telephone Encounter (Signed)
  Chronic Care Management   Note  09/30/2019 Name: Ashley Cross MRN: [id number] DOB: [birth year]   Attempted to contact patient for scheduled appointment for medication management support. Left HIPAA compliant message for patient to return my call at their convenience.    Plan: - If I do not hear back from the patient by end of business today, will collaborate with Care Guide to outreach to schedule follow up with me  [name], PharmD, [place], [place] Pharmacist [place] [phone]

## 2019-10-01 NOTE — Telephone Encounter (Signed)
Pt has been r/s for 10/28/2019

## 2019-10-02 ENCOUNTER — Telehealth: Payer: Self-pay

## 2019-10-02 NOTE — Telephone Encounter (Signed)
Thanks for letting me know!

## 2019-10-02 NOTE — Telephone Encounter (Signed)
pt called left message that the EKG was fine and that she started the medication.

## 2019-10-06 NOTE — Telephone Encounter (Signed)
No entry 

## 2019-10-09 DIAGNOSIS — C50012 Malignant neoplasm of nipple and areola, left female breast: Secondary | ICD-10-CM | POA: Diagnosis not present

## 2019-10-13 ENCOUNTER — Ambulatory Visit (INDEPENDENT_AMBULATORY_CARE_PROVIDER_SITE_OTHER): Payer: PPO | Admitting: Licensed Clinical Social Worker

## 2019-10-13 ENCOUNTER — Other Ambulatory Visit: Payer: Self-pay

## 2019-10-13 DIAGNOSIS — F331 Major depressive disorder, recurrent, moderate: Secondary | ICD-10-CM

## 2019-10-13 NOTE — Progress Notes (Signed)
Virtual Visit via Video Note  I connected with Marlow Hendrie on 10/13/19 at 11:00 AM EDT by a video enabled telemedicine application and verified that I am speaking with the correct person using two identifiers.  Location: Patient: home Provider: [facility]   I discussed the limitations of evaluation and management by telemedicine and the availability of in person appointments. The patient expressed understanding and agreed to proceed.  The patient was advised to call back or seek an in-person evaluation if the symptoms worsen or if the condition fails to improve as anticipated.  I provided 60 minutes of non-face-to-face time during this encounter.   Eliud Polo R Dhruva Orndoff, LCSW    THERAPIST PROGRESS NOTE  Session Time: 11:00-12:00pm pm  Participation Level: Active  Behavioral Response: Neat and Well GroomedAlertDepressed  Type of Therapy: Individual Therapy  Treatment Goals addressed: Coping  Interventions: Supportive and Other: grief counseling  Summary: Adelyn Roscher is a 64 y.o. female who presents with improving anxiety symptoms and manageable depression symptoms. Pt reporting that mood is stable, sleep quality and quantity okay, and that she is compliant with medication.  Pt reports that she got clearance from EKG and is eager to start new medication that Dr. Shea Evans suggested for her. Pt denies that she has rx or has started the new med at time of session.   Allowed pt to explore and express thoughts and feelings associated with daughter's death--2 year anniversary is coming up soon and pt was brainstorming through ways of memorializing with daughter's friend, Estill Bamberg. Discussed pts grief journey and how pt feels her life has changed. Pt admits its hard to find happiness in the day--helped pt to go through some of her precious moments with pets and identify areas where she does feel happy.  Suicidal/Homicidal: No  SI, HI, or AVH reported at time of  session.  Therapist Response: Pt reports decrease in intensity and frequency of flashbacks. Pt reports a decrease in anxiety symptoms. Pt feeling less depressed than at last session.  All evidence of continuing progress.   Plan: Return again in 4 weeks. Ongoing treatment plan includes managing grief, managing anxiety  Diagnosis: Axis I: Bereavement and Major Depression, Recurrent moderate    Axis II: No diagnosis    Margreta Journey R Quintella Mura, LCSW 10/13/2019

## 2019-10-21 ENCOUNTER — Ambulatory Visit: Payer: PPO | Admitting: Internal Medicine

## 2019-10-21 ENCOUNTER — Telehealth: Payer: Self-pay

## 2019-10-21 ENCOUNTER — Other Ambulatory Visit: Payer: Self-pay | Admitting: Child and Adolescent Psychiatry

## 2019-10-21 DIAGNOSIS — F331 Major depressive disorder, recurrent, moderate: Secondary | ICD-10-CM

## 2019-10-21 NOTE — Telephone Encounter (Signed)
pt called left message that she done her ekg weeks ago and that she still has not received the medication so she states she going to stop the abilify.

## 2019-10-21 NOTE — Telephone Encounter (Signed)
Dr. Eappen's pt

## 2019-10-21 NOTE — Telephone Encounter (Signed)
Returned call to patient.  She reports its the birthday of her daughter who passed away 2 years ago tomorrow.  She however reports she is coping okay.  She reports she stopped the Abilify yesterday.  She does not know if she wants to get on Geodon at this time.  Discussed to wait and watch and let writer know if she decides to.  Discussed with her that I have reviewed the recent EKG and it was within normal limits.  However the message that I got stated that patient had started the medication and that is why I did not give her a call back.  Apologized to patient for the same.  She understood.  She is currently struggling with sleep since stopping the Abilify.  Advised patient to increase Lunesta to 2 mg as needed.  She will reach out if she needs more help.

## 2019-10-28 ENCOUNTER — Ambulatory Visit (INDEPENDENT_AMBULATORY_CARE_PROVIDER_SITE_OTHER): Payer: PPO | Admitting: Pharmacist

## 2019-10-28 DIAGNOSIS — F419 Anxiety disorder, unspecified: Secondary | ICD-10-CM | POA: Diagnosis not present

## 2019-10-28 DIAGNOSIS — I10 Essential (primary) hypertension: Secondary | ICD-10-CM

## 2019-10-28 DIAGNOSIS — J449 Chronic obstructive pulmonary disease, unspecified: Secondary | ICD-10-CM

## 2019-10-28 DIAGNOSIS — F329 Major depressive disorder, single episode, unspecified: Secondary | ICD-10-CM

## 2019-10-28 NOTE — Chronic Care Management (AMB) (Signed)
Chronic Care Management   Follow Up Note   10/28/2019 Name: Ashley Cross MRN: [id number] DOB: 1956-01-07  Referred by: McLean-Scocuzza, Nino Glow, MD Reason for referral : Chronic Care Management (Medication Management)   Ashley Cross is a 64 y.o. year old female who is a primary care patient of McLean-Scocuzza, Nino Glow, MD. The CCM team was consulted for assistance with chronic disease management and care coordination needs.    Contacted patient for medication management f/u.   Review of patient status, including review of consultants reports, relevant laboratory and other test results, and collaboration with appropriate care team members and the patient's provider was performed as part of comprehensive patient evaluation and provision of chronic care management services.    SDOH (Social Determinants of Health) assessments performed: Yes See Care Plan activities for detailed interventions related to SDOH)  SDOH Interventions     Most Recent Value  SDOH Interventions  SDOH Interventions for the Following Domains Tobacco  Tobacco Interventions Cessation Materials Given and Reviewed       Outpatient Encounter Medications as of 10/28/2019  Medication Sig   albuterol (VENTOLIN HFA) 108 (90 Base) MCG/ACT inhaler Inhale 1-2 puffs into the lungs every 4 (four) hours as needed for wheezing or shortness of breath.   azelastine (ASTELIN) 0.1 % nasal spray    buPROPion (WELLBUTRIN XL) 150 MG 24 hr tablet Take 1 tablet (150 mg total) by mouth daily. To be combined with 300 mg daily in the AM   buPROPion (WELLBUTRIN XL) 300 MG 24 hr tablet Take 1 tablet (300 mg total) by mouth daily. Further refills psych Dr. Shea Evans   DULoxetine (CYMBALTA) 30 MG capsule Take 3 capsules (90 mg total) by mouth daily.   eszopiclone (LUNESTA) 1 MG TABS tablet TAKE ONE TABLET BY MOUTH IMMEDIATELY BEFORE BEDTIME.   fluticasone (FLONASE) 50 MCG/ACT nasal spray Place 2 sprays into both nostrils  daily. Max 2 sprays   furosemide (LASIX) 40 MG tablet Take 1 tablet (40 mg total) by mouth daily. In am D/c hctz/telmisartan combo pill   gabapentin (NEURONTIN) 600 MG tablet Take 1,200 mg by mouth 3 (three) times daily.   hydrOXYzine (VISTARIL) 25 MG capsule Take 1 capsule (25 mg total) by mouth every 8 (eight) hours as needed for anxiety.   ipratropium (ATROVENT) 0.06 % nasal spray Place 2 sprays into both nostrils 3 (three) times daily. prn   letrozole (FEMARA) 2.5 MG tablet Take 1 tablet (2.5 mg total) by mouth daily.   metoprolol succinate (TOPROL-XL) 25 MG 24 hr tablet Take 1 tablet (25 mg total) by mouth daily.   montelukast (SINGULAIR) 10 MG tablet Take 1 tablet (10 mg total) by mouth at bedtime.   Multiple Vitamins-Minerals (MULTIVITAMIN) tablet Take 1 tablet by mouth daily.   omeprazole (PRILOSEC) 20 MG capsule    oxymorphone (OPANA) 10 MG tablet Take 10 mg by mouth 2 (two) times daily.    senna-docusate (SENOKOT-S) 8.6-50 MG tablet Take 1-2 tablets by mouth daily as needed for mild constipation.   telmisartan (MICARDIS) 80 MG tablet Take 1 tablet (80 mg total) by mouth daily.   Tiotropium Bromide-Olodaterol (STIOLTO RESPIMAT) 2.5-2.5 MCG/ACT AERS Inhale 2 puffs into the lungs daily.   topiramate (TOPAMAX) 25 MG tablet TAKE 1 TABLET BY MOUTH DAILY. FOR [facility] SIDE EFFECT OF ABILIFY   [DISCONTINUED] amoxicillin-clavulanate (AUGMENTIN) 875-125 MG tablet Take 1 tablet by mouth 2 (two) times daily. With food   [DISCONTINUED] CHANTIX STARTING MONTH PAK 0.5 MG X 11 &  1 MG X 42 tablet See admin instructions.   [DISCONTINUED] nicotine (NICODERM CQ - DOSED IN MG/24 HOURS) 14 mg/24hr patch Place 1 patch (14 mg total) onto the skin daily. (Patient not taking: Reported on 10/28/2019)   [DISCONTINUED] nicotine (NICODERM CQ - DOSED IN MG/24 HOURS) 21 mg/24hr patch Place 1 patch (21 mg total) onto the skin daily. X 2 months 14 mg x 2 months, 7 mg x 2 months (Patient not taking:  Reported on 10/28/2019)   [DISCONTINUED] nicotine (NICODERM CQ - DOSED IN MG/24 HR) 7 mg/24hr patch Place 1 patch (7 mg total) onto the skin daily. (Patient not taking: Reported on 10/28/2019)   No facility-administered encounter medications on file as of 10/28/2019.     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 Enjoying this, gives her a purpose.   Polypharmacy; complex patient with multiple comorbidities including chronic pain, depression, hx breast cancer, tobacco use disorder, alcohol abuse, HTN o Major depression: exacerbated by death of her daughter, [name]. Follows w/ Dr. Shea Evans; duloxetine 90 mg daily (30 mg x3) bupropion XL 300 mg daily- d/c aripiprazole because she did not like the weight gain. Notes that she has been off for ~1.5 weeks and feels like she can tell a difference in appetite. Has appt w/ Dr. Shea Evans tomorrow for f/u to discuss starting ziprasidone.  o Insomina: Lunesta 1 mg QPM, PRN hydroxyzine; notes she is sleeping really well right now o Chronic pain: Opana 10 mg BID; gabapentin 1200 mg TID (6 600 mg tabs daily) o HTN: telmisartan 80 mg QAM, furosemide 40 mg daily PRN swelling o Hx breast cancer: letrozole 2.5 mg daily o Tobacco abuse/COPD:Stiolto daily, HFA PRN - APPROVED for Stiolto assistance through [employer] through 02/05/20 - Smoking ~1/2 ppd recently. Tried nicotine replacement therapy patches, but reports that she had a topical reaction to the patches - burning, itching. Tried to "power through", but became too irritating. Does not remember which brand of nicotine replacement therapy patches she had. Notes that she also received a supply of lozenges from  [facility], but notes that she cannot stand the taste/texture.   Pharmacist Clinical Goal(s):   Over the next 90 days, patient will work with PharmD and provider towards optimized medication management  Interventions:  Comprehensive medication review performed, medication list updated in electronic medical record  Inter-disciplinary care team collaboration (see longitudinal plan of care)  Reviewed that weight gain is a side effect of antipsychotics. Encouraged to discuss her concerns w/ weight gain (and reported hx of weight associated mental health concerns) to Dr. Shea Evans tomorrow when formulating a plan  Likely a topical reaction to patch adhesive. Would recommend trialing a different brand of nicotine patch that may have a different type of adhesive. Patient is already on max dose bupropion, and Chantix is not currently available. Only other therapy w/ data for supporting tobacco cessation is nortriptyline. Patient has a hx amitriptyline, so this may not be the best option. Suggested nicotine gum monotherapy as a remaining option. Patient is going to reach out to her health plan to determine their Over the Counter benefits discount  program and determine if purchasing nicotine gum through that route would be cheaper than GoodRx coupon at [employer] (~$30 for 300 pieces of gum). She will let me know what she finds out from [employer].   Patient Self Care Activities:   Patient will take medications as prescribed  Please see past updates related to this goal by clicking on the "Past Updates" button in the selected goal          Plan:  - Scheduled f/u call in ~5 weeks  [name], PharmD, [place], [place] Pharmacist [place] [phone]

## 2019-10-28 NOTE — Patient Instructions (Signed)
Ms. Beining,   Reach out to [facility] and see if they have any programs to help reduce the cost of nicotine replacement therapy. We could try getting the gum, and you using that throughout the day as a replacement for cigarettes. Call me if you need [place] to send a prescription somewhere (or, if we need to try the GoodRx card at [facility]; looks like the cost is ~$30 for a month supply)  See if you can figure out what BRAND of nicotine patches you got. I expect your reaction was to the adhesive, and a different brand of nicotine patches might have a different adhesive. Call me if you figure this out.   Take care!  [name], PharmD [phone]  Visit Information  Goals Addressed              This Visit's Progress     Patient Stated   .  "I want to be healthy" (pt-stated)        CARE PLAN ENTRY (see longtitudinal plan of care for additional care plan information)  Current Barriers:  . 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 Enjoying this, gives her a purpose.  . Polypharmacy; complex patient with multiple comorbidities including chronic pain, depression, hx breast cancer, tobacco use disorder, alcohol abuse, HTN o Major depression: exacerbated by death of her daughter, [name]. Follows w/ Dr. Shea Evans; duloxetine 90 mg daily (30 mg x3) bupropion XL 300 mg daily- d/c aripiprazole because she did not like the weight gain. Notes that she has been off for ~1.5 weeks and feels like she can tell a difference in appetite. Has appt w/ Dr. Shea Evans tomorrow for f/u to discuss starting ziprasidone.  o Insomina: Lunesta 1 mg QPM, PRN hydroxyzine; notes she is sleeping really well right now o Chronic pain: Opana 10 mg BID; gabapentin 1200 mg TID (6 600 mg tabs daily) o HTN: telmisartan 80 mg QAM, furosemide 40 mg daily PRN swelling o Hx breast cancer: letrozole 2.5 mg daily o Tobacco abuse/COPD:Stiolto daily, HFA PRN - APPROVED  for Stiolto assistance through [employer] through 02/05/20 - Smoking ~1/2 ppd recently. Tried nicotine replacement therapy patches, but reports that she had a topical reaction to the patches - burning, itching. Tried to "power through", but became too irritating. Does not remember which brand of nicotine replacement therapy patches she had. Notes that she also received a supply of lozenges from [facility], but notes that she cannot stand the taste/texture.   Pharmacist Clinical Goal(s):  [place] Kitchen Over the next 90 days, patient will work with PharmD and provider towards optimized medication management  Interventions: . Comprehensive medication review performed, medication list updated in electronic medical record . Inter-disciplinary care team collaboration (see longitudinal plan of care) . Reviewed that weight gain is a side effect of antipsychotics. Encouraged to discuss her concerns w/ weight gain (and reported hx of weight associated mental health concerns) to Dr. Shea Evans tomorrow when formulating a plan . Likely a topical reaction to patch adhesive. Would recommend trialing a different brand of nicotine patch that may have a different type of adhesive. Patient is already on max dose bupropion, and Chantix is not currently available. Only other therapy w/ data for supporting tobacco cessation is nortriptyline. Patient has a hx amitriptyline, so this may not be the best option. Suggested nicotine gum monotherapy as a remaining option. Patient is going to reach out to her health plan to determine their Over the Counter benefits discount  program and determine if purchasing nicotine gum through that route would be cheaper than GoodRx coupon at [employer] (~$30 for 300 pieces of gum). She will let me know what she finds out from [employer].   Patient Self Care Activities:  . Patient will take medications as prescribed  Please see past updates related to this goal by clicking on the  "Past Updates" button in the selected goal         The patient verbalized understanding of instructions provided today and declined a print copy of patient instruction materials.  Plan:  - Scheduled f/u call in ~5 weeks  [name], PharmD, [place], [place] Pharmacist [place] [phone]

## 2019-10-29 ENCOUNTER — Encounter: Payer: Self-pay | Admitting: Psychiatry

## 2019-10-29 ENCOUNTER — Telehealth (INDEPENDENT_AMBULATORY_CARE_PROVIDER_SITE_OTHER): Payer: PPO | Admitting: Psychiatry

## 2019-10-29 ENCOUNTER — Other Ambulatory Visit: Payer: Self-pay

## 2019-10-29 DIAGNOSIS — F331 Major depressive disorder, recurrent, moderate: Secondary | ICD-10-CM

## 2019-10-29 DIAGNOSIS — F17200 Nicotine dependence, unspecified, uncomplicated: Secondary | ICD-10-CM

## 2019-10-29 DIAGNOSIS — F1010 Alcohol abuse, uncomplicated: Secondary | ICD-10-CM | POA: Diagnosis not present

## 2019-10-29 MED ORDER — HYDROXYZINE PAMOATE 25 MG PO CAPS: 25.0000 mg | ORAL_CAPSULE | Freq: Three times a day (TID) | ORAL | 1 refills | Status: DC | PRN

## 2019-10-29 MED ORDER — TOPIRAMATE 25 MG PO TABS: 25.0000 mg | ORAL_TABLET | Freq: Two times a day (BID) | ORAL | 1 refills | Status: DC

## 2019-10-29 NOTE — Progress Notes (Signed)
Provider Location : [facility] Patient Location : Home  Participants: Patient , Provider  Virtual Visit via Telephone Note  I connected with Ashley Cross on 10/29/19 at  8:30 AM EDT by telephone and verified that I am speaking with the correct person using two identifiers.   I discussed the limitations, risks, security and privacy concerns of performing an evaluation and management service by telephone and the availability of in person appointments. I also discussed with the patient that there may be a patient responsible charge related to this service. The patient expressed understanding and agreed to proceed.    I discussed the assessment and treatment plan with the patient. The patient was provided an opportunity to ask questions and all were answered. The patient agreed with the plan and demonstrated an understanding of the instructions.   The patient was advised to call back or seek an in-person evaluation if the symptoms worsen or if the condition fails to improve as anticipated.   Ashley Cross OP Progress Note  10/29/2019 8:54 AM Ashley Cross  MRN:  [id number]  Chief Complaint:  Chief Complaint    Follow-up     HPI: Ashley Cross is a 64 year old Caucasian female on disability, lives in [place], has a history of MDD, alcohol use disorder, history of breast cancer, chronic pain, hypertension was evaluated by phone today.  Patient today reports she never got the prescription for Geodon that was sent out to the pharmacy.  Discussed with patient that per our discussion on 10/21/2019 she had told writer that she did not want Geodon sent out to the pharmacy and wanted to wait.  Patient apologized and reported that she does remember saying that.  Patient today reports she was able to get through her daughter's death anniversary which was recent.  She spent the day with a friend who is fighting  cancer.  She reports that helped her to get through the day.  She reports she  has started working as a Programmer, applications for an elderly lady.  She reports that does help her to keep her mind occupied doing something and also helps her financially.  This has been helping her to feel better.  She reports she continues to worry about her weight gain.  She is interested in going back on the Topamax even though she is no longer taking the Abilify.  Patient reports sleep is overall okay.  She did not increase the Lunesta as discussed few days ago.  Patient denies any suicidality, homicidality or perceptual disturbances.  She continues to be psychotherapy sessions.  She has been cutting back on smoking.  Patient denies any other concerns today.  Visit Diagnosis:    ICD-10-CM   1. MDD (major depressive disorder), recurrent episode, moderate (HCC)  F33.1 topiramate (TOPAMAX) 25 MG tablet    hydrOXYzine (VISTARIL) 25 MG capsule  2. Alcohol use disorder, mild, abuse  F10.10 topiramate (TOPAMAX) 25 MG tablet  3. Tobacco use disorder  F17.200     Past Psychiatric History: I have reviewed past psychiatric history from my progress note on 09/16/2018.  Past trials of Zoloft, Wellbutrin, Lexapro, Celexa  Past Medical History:  Past Medical History:  Diagnosis Date  . Anxiety   . Breast cancer ([place])    Left breast(nipple mass) 2018  . Cervical stenosis of spine   . Chicken pox   . Chronic back pain   . Colon polyps   . COPD (chronic obstructive pulmonary disease) ([place])   . Cyst  of left nipple   . Depression   . Hypertension   . Insomnia   . Personal history of radiation therapy     Past Surgical History:  Procedure Laterality Date  . BACK SURGERY     x 3 (05-20-2008 x 2; 05/21/11 x 1)   . BREAST BIOPSY Left    core- neg  . BREAST LUMPECTOMY Left 05-20-16  . BREAST SURGERY Left    breast biopsy  . CERVICAL CONE BIOPSY    . CERVICAL FUSION     times 2  . COLONOSCOPY WITH PROPOFOL N/A 04/18/2015   Procedure: COLONOSCOPY WITH PROPOFOL;  Surgeon: [name], Cross;   [facility] ENDOSCOPY;  Service: Endoscopy;  Laterality: N/A;  . ELBOW SURGERY     05-20-2012 b/l elbows per pt ulnar nerve   . ELBOW SURGERY     x2   . EPIDURAL BLOCK INJECTION     Dr. Maryjean Ka   . MASS EXCISION Left 04/12/2016   Procedure: EXCISION OF LEFT NIPPLE MASS;  Surgeon: Stark Klein, Cross;  Location: [facility];  Service: General;  Laterality: Left;  . NECK SURGERY    . POSTERIOR CERVICAL FUSION/FORAMINOTOMY  03/07/2011   Procedure: POSTERIOR CERVICAL FUSION/FORAMINOTOMY LEVEL 4;  Surgeon: Eustace Moore, Cross;  Location: [facility] NEURO ORS;  Service: Neurosurgery;  Laterality: N/A;  cervical three to thoracic one posterior cervical fusion   . SENTINEL NODE BIOPSY Left 05/29/2016   Procedure: LEFT SENTINEL LYMPH NODE BIOPSY;  Surgeon: Stark Klein, Cross;  Location: [facility];  Service: General;  Laterality: Left;  . TONSILLECTOMY      Family Psychiatric History: I have reviewed family psychiatric history from my progress note on 09/16/2018  Family History:  Family History  Problem Relation Age of Onset  . Breast cancer Mother 59  . Hypertension Mother   . Cancer Mother        breast dx'ed 73   . Cancer Father        prostate  . Hypertension Father   . Drug abuse Daughter   . Hypertension Maternal Grandmother   . Breast cancer Cousin     Social History: Reviewed social history from my progress note on 09/16/2018 Social History   Socioeconomic History  . Marital status: Divorced    Spouse name: Not on file  . Number of children: 2  . Years of education: Not on file  . Highest education level: Not on file  Occupational History  . Not on file  Tobacco Use  . Smoking status: Current Every Day Smoker    Packs/day: 0.75    Years: 30.00    Pack years: 22.50    Types: Cigarettes  . Smokeless tobacco: Never Used  . Tobacco comment: currently 5 cigarettes per day reported 06/22/2019  Vaping Use  . Vaping Use: Every day  Substance and Sexual Activity  . Alcohol use: Yes     Comment: social  . Drug use: No    Comment: on opana since jan 2018  . Sexual activity: Not on file  Other Topics Concern  . Not on file  Social History Narrative   As of 01/25/17 not sexually active of in relationship    Divorced   2 kids son and daughter (though daughter died of suicide in 20-May-2017)   Son lives in [place] now as of 12/2018       [place] has 3 dogs  Social Determinants of Health   Financial Resource Strain: Medium Risk  . Difficulty of Paying Living Expenses: Somewhat hard  Food Insecurity:   . Worried About Charity fundraiser in the Last Year: Not on file  . Ran Out of Food in the Last Year: Not on file  Transportation Needs:   . Lack of Transportation (Medical): Not on file  . Lack of Transportation (Non-Medical): Not on file  Physical Activity:   . Days of Exercise per Week: Not on file  . Minutes of Exercise per Session: Not on file  Stress:   . Feeling of Stress : Not on file  Social Connections:   . Frequency of Communication with Friends and Family: Not on file  . Frequency of Social Gatherings with Friends and Family: Not on file  . Attends Religious Services: Not on file  . Active Member of Clubs or Organizations: Not on file  . Attends Archivist Meetings: Not on file  . Marital Status: Not on file    Allergies:  Allergies  Allergen Reactions  . Augmentin [Amoxicillin-Pot Clavulanate]     Diarrhea   . Norvasc [Amlodipine]     Swelling   . Sulfa Antibiotics Itching and Rash  . Sulfonamide Derivatives Itching and Rash    Metabolic Disorder Labs: Lab Results  Component Value Date   HGBA1C 5.1 02/27/2019   No results found for: PROLACTIN Lab Results  Component Value Date   CHOL 203 (H) 08/25/2019   TRIG 77.0 08/25/2019   HDL 102.30 08/25/2019   CHOLHDL 2 08/25/2019   VLDL 15.4 08/25/2019   LDLCALC 85 08/25/2019   LDLCALC 74 02/27/2019   Lab Results  Component Value Date   TSH 0.71  08/25/2019   TSH 2.91 04/03/2019    Therapeutic Level Labs: No results found for: LITHIUM No results found for: VALPROATE No components found for:  CBMZ  Current Medications: Current Outpatient Medications  Medication Sig Dispense Refill  . albuterol (VENTOLIN HFA) 108 (90 Base) MCG/ACT inhaler Inhale 1-2 puffs into the lungs every 4 (four) hours as needed for wheezing or shortness of breath. 18 g 12  . azelastine (ASTELIN) 0.1 % nasal spray     . buPROPion (WELLBUTRIN XL) 150 MG 24 hr tablet Take 1 tablet (150 mg total) by mouth daily. To be combined with 300 mg daily in the AM 90 tablet 0  . buPROPion (WELLBUTRIN XL) 300 MG 24 hr tablet Take 1 tablet (300 mg total) by mouth daily. Further refills psych Dr. Shea Evans 90 tablet 1  . DULoxetine (CYMBALTA) 30 MG capsule Take 3 capsules (90 mg total) by mouth daily. 270 capsule 0  . eszopiclone (LUNESTA) 1 MG TABS tablet TAKE ONE TABLET BY MOUTH IMMEDIATELY BEFORE BEDTIME. 30 tablet 2  . fluticasone (FLONASE) 50 MCG/ACT nasal spray Place 2 sprays into both nostrils daily. Max 2 sprays 16 g 11  . furosemide (LASIX) 40 MG tablet Take 1 tablet (40 mg total) by mouth daily. In am D/c hctz/telmisartan combo pill 90 tablet 1  . gabapentin (NEURONTIN) 600 MG tablet Take 1,200 mg by mouth 3 (three) times daily.    . hydrOXYzine (VISTARIL) 25 MG capsule Take 1 capsule (25 mg total) by mouth every 8 (eight) hours as needed for anxiety. 90 capsule 1  . ipratropium (ATROVENT) 0.06 % nasal spray Place 2 sprays into both nostrils 3 (three) times daily. prn 15 mL 12  . letrozole (FEMARA) 2.5 MG tablet Take 1 tablet (2.5 mg  total) by mouth daily. 90 tablet 3  . metoprolol succinate (TOPROL-XL) 25 MG 24 hr tablet Take 1 tablet (25 mg total) by mouth daily. 90 tablet 3  . montelukast (SINGULAIR) 10 MG tablet Take 1 tablet (10 mg total) by mouth at bedtime. 90 tablet 3  . Multiple Vitamins-Minerals (MULTIVITAMIN) tablet Take 1 tablet by mouth daily. 90 tablet 3  .  omeprazole (PRILOSEC) 20 MG capsule     . oxymorphone (OPANA) 10 MG tablet Take 10 mg by mouth 2 (two) times daily.     Marland Kitchen senna-docusate (SENOKOT-S) 8.6-50 MG tablet Take 1-2 tablets by mouth daily as needed for mild constipation. 180 tablet 1  . telmisartan (MICARDIS) 80 MG tablet Take 1 tablet (80 mg total) by mouth daily. 90 tablet 3  . Tiotropium Bromide-Olodaterol (STIOLTO RESPIMAT) 2.5-2.5 MCG/ACT AERS Inhale 2 puffs into the lungs daily. 4 g 11  . topiramate (TOPAMAX) 25 MG tablet Take 1 tablet (25 mg total) by mouth 2 (two) times daily. 60 tablet 1   No current facility-administered medications for this visit.     Musculoskeletal: Strength & Muscle Tone: UTA Gait & Station: UTA Patient leans: N/A  Psychiatric Specialty Exam: Review of Systems  Psychiatric/Behavioral: Positive for dysphoric mood. Negative for agitation, behavioral problems, confusion, decreased concentration, hallucinations, self-injury, sleep disturbance and suicidal ideas. The patient is not nervous/anxious and is not hyperactive.   All other systems reviewed and are negative.   There were no vitals taken for this visit.There is no height or weight on file to calculate BMI.  General Appearance: UTA  Eye Contact:  UTA  Speech:  Clear and Coherent  Volume:  Normal  Mood:  Depressed improving  Affect:  UTA  Thought Process:  Goal Directed and Descriptions of Associations: Intact  Orientation:  Full (Time, Place, and Person)  Thought Content: Logical   Suicidal Thoughts:  No  Homicidal Thoughts:  No  Memory:  Immediate;   Fair Recent;   Fair Remote;   Fair  Judgement:  Fair  Insight:  Fair  Psychomotor Activity:  UTA  Concentration:  Concentration: Fair and Attention Span: Fair  Recall:  [employer] of Knowledge: Fair  Language: Fair  Akathisia:  No  Handed:  Right  AIMS (if indicated): UTA  Assets:  Communication Skills Desire for Improvement Housing Social Support  ADL's:  Intact  Cognition:  WNL  Sleep:  Fair   Screenings: GAD-7     Office Visit from 06/12/2019 in [facility] Visit from 09/30/2017 in [facility]  Total GAD-7 Score 18 18    PHQ2-9     Office Visit from 06/12/2019 in [facility] from 12/31/2018 in [facility] from 09/16/2018 in [facility] Visit from 09/30/2017 in [facility] Visit from 08/19/2017 in [facility]  PHQ-2 Total Score 4 0 0 4 4  PHQ-9 Total Score 17 -- -- 18 16       Assessment and Plan: Shaylyn Bawa is a 64 year old Caucasian female, divorced, disabled, has a history of depression, chronic pain, hypertension, history of breast cancer was evaluated by phone today.  She is biologically predisposed given her family history of substance abuse problems.  Patient with psychosocial stressors of health issues, financial problems, death of her daughter 2 years ago.  Patient is currently making progress with regards to her mood however continues to struggle with weight  gain side effects of her antipsychotics which she recently stopped.  She will continue to benefit from medication management and psychotherapy sessions.  Plan MDD-improving Cymbalta 90 mg p.o. daily Wellbutrin extended release 450 mg p.o. daily Abilify has been discontinued. Restart Topamax 25 mg p.o. twice daily.  Patient educated about the side effects of Topamax including its effect on her cognitive function, her mood.  She will monitor. Lunesta 1 mg p.o. nightly for sleep Hydroxyzine 25 mg p.o. daily as needed for severe anxiety.  Alcohol use disorder mild in early remission Patient currently uses it only socially.  Denies misuse or abusing it.  Tobacco use disorder-improving She is currently cutting back.  Provided counseling.  Patient advised to continue CBT  Also provided education  about making lifestyle changes, exercise, dietary changes.  Follow-up in clinic in 4 weeks or sooner if needed.  I have spent atleast 20 minutes non face to face with patient today. More than 50 % of the time was spent for preparing to see the patient ( e.g., review of test, records ),ordering medications and test ,psychoeducation and supportive psychotherapy and care coordination,as well as documenting clinical information in electronic health record. This note was generated in part or whole with voice recognition software. Voice recognition is usually quite accurate but there are transcription errors that can and very often do occur. I apologize for any typographical errors that were not detected and corrected.       Ursula Alert, Cross 10/29/2019, 8:54 AM

## 2019-11-02 DIAGNOSIS — M5416 Radiculopathy, lumbar region: Secondary | ICD-10-CM | POA: Diagnosis not present

## 2019-11-03 ENCOUNTER — Other Ambulatory Visit: Payer: Self-pay | Admitting: Psychiatry

## 2019-11-03 DIAGNOSIS — F331 Major depressive disorder, recurrent, moderate: Secondary | ICD-10-CM

## 2019-11-10 ENCOUNTER — Ambulatory Visit (INDEPENDENT_AMBULATORY_CARE_PROVIDER_SITE_OTHER): Payer: PPO | Admitting: Licensed Clinical Social Worker

## 2019-11-10 ENCOUNTER — Other Ambulatory Visit: Payer: Self-pay

## 2019-11-10 DIAGNOSIS — F331 Major depressive disorder, recurrent, moderate: Secondary | ICD-10-CM

## 2019-11-10 NOTE — Progress Notes (Signed)
Virtual Visit via Video Note  I connected with Tanaka Gillen on 11/10/19 at 11:00 AM EDT by a video enabled telemedicine application and verified that I am speaking with the correct person using two identifiers.  Location: Patient: home  Provider: [facility]   I discussed the limitations of evaluation and management by telemedicine and the availability of in person appointments. The patient expressed understanding and agreed to proceed.   The patient was advised to call back or seek an in-person evaluation if the symptoms worsen or if the condition fails to improve as anticipated.  Video connection was lost when less than 50% of the duration of the visit was complete, at which time the remainder of the visit was completed via audio only.   I provided 60 minutes of non-face-to-face time during this encounter.   Tariya Morrissette R Ellenore Roscoe, LCSW    THERAPIST PROGRESS NOTE  Session Time: 11:00-12:00p  Participation Level: Active  Behavioral Response: NAAlertAnxious and Depressed  Type of Therapy: Individual Therapy  Treatment Goals addressed: Coping  Interventions: CBT and Supportive  Summary: Madline Oesterling is a 64 y.o. female who presents with continuing symptoms related to her diagnosis of depression, grief.  Pt reports fair quality and quantity of sleep and reports that appetite is decreased.   Pt reports that she decided not to take the Geodon due to weight gain and "not feeling right" when taking meds. Pt also reports an escalation of depression symptoms. Pt reports that she has not felt motivated to engage in physical activity, and rarely leaves her house.  Pt denies SI and HI.  Allowed pt to explore thoughts and feelings related to recent incident where ex boyfriend came to pts house (he is a Industrial/product designer) to share a contact with her that could help with yardwork.  Pt states that the conversation led to him telling her all about his successes and how happy he is with current  relationship "you know, he was rubbing all of this in my face about how wonderful his new girlfriend is".  Pt then states that after her conversation with him, "something happened with my phone". Pt reports that ex boyfriend put a block on her phone and that she can't see photos, screenshot items, or even connect for counseling appointment. Pt feels it was triggered by her saying that she was looking at a [employer]. When asked if ex boyfriend was tech savvy pt stated "well he has HBO on his TV".    Pt reports feeling very depressed: depressed mood, insomnia, negative thoughts about self, crying spells regularly.   Reviewed some CBT methods of managing depression: mindfulness, positive behavioral activities, reframing thoughts.   Pt very engaged throughout session.   Encouraged pt to focus on lots of self-care and keeping life in balance until next appt. Pt will call if appt needed prior to 3 wks. Encouraged local hospitals and/or [facility] center in [place] if needed.   Suicidal/Homicidal: No  SI, HI, or AVH reported at time of session.  Therapist Response: [place] reports an escalation in symptoms triggered by external event; which seems to be overwhelming pts resources at the time.  This is indicative of fluctuating/intermittent progress.   Plan: Return again in 3 weeks. Ongoing treatment plans include managing mood, medication compliance, managing anxiety/stress, processing trauma, managing bereavement.   Diagnosis: Axis I: Bereavement and Major Depression, Recurrent moderate    Axis II: No diagnosis    Rachel Bo Oluwaseun Cremer, LCSW 11/10/2019

## 2019-11-26 ENCOUNTER — Other Ambulatory Visit: Payer: Self-pay | Admitting: Hematology and Oncology

## 2019-11-26 DIAGNOSIS — Z170 Estrogen receptor positive status [ER+]: Secondary | ICD-10-CM

## 2019-11-26 DIAGNOSIS — C50512 Malignant neoplasm of lower-outer quadrant of left female breast: Secondary | ICD-10-CM

## 2019-12-01 ENCOUNTER — Telehealth: Payer: Self-pay | Admitting: Internal Medicine

## 2019-12-01 NOTE — Telephone Encounter (Signed)
Left message for patient to call back and schedule Medicare Annual Wellness Visit (AWV)  ° °This should be a telephone visit only=30 minutes. ° °No hx of AWV; please schedule at anytime with Denisa O'Brien-Blaney at [facility] [place] ° ° °

## 2019-12-02 ENCOUNTER — Ambulatory Visit (INDEPENDENT_AMBULATORY_CARE_PROVIDER_SITE_OTHER): Payer: PPO | Admitting: Licensed Clinical Social Worker

## 2019-12-02 ENCOUNTER — Other Ambulatory Visit: Payer: Self-pay

## 2019-12-02 DIAGNOSIS — F331 Major depressive disorder, recurrent, moderate: Secondary | ICD-10-CM | POA: Diagnosis not present

## 2019-12-02 NOTE — Progress Notes (Signed)
Virtual Visit via Video Note  I connected with Ashley Cross on 12/02/19 at  2:30 PM EDT by a video enabled telemedicine application and verified that I am speaking with the correct person using two identifiers.  Location: Patient: home Provider: remote office [place], [place])   I discussed the limitations of evaluation and management by telemedicine and the availability of in person appointments. The patient expressed understanding and agreed to proceed.   The patient was advised to call back or seek an in-person evaluation if the symptoms worsen or if the condition fails to improve as anticipated.  I provided 30 minutes of non-face-to-face time during this encounter.   Rachel Bo Peggye Poon, LCSW '  THERAPIST PROGRESS NOTE  Session Time: 3:00-3:30p  Participation Level: Active  Behavioral Response: NAAlertAnxious  Type of Therapy: Individual Therapy  Treatment Goals addressed: Coping  Interventions: Supportive  Summary: Ashley Cross is a 64 y.o. female who presents with improving symptoms related to depression diagnosis. Pt reports good quality and quantity of sleep and eating behaviors within normal limits.  Pt reports compliance  With medication.  Allowed pt to explore and express thoughts and feelings about recent external stressors: reflected on last session and pts feelings about ex boyfriend--pt is happy to put those feelings behind her and is happy to be moving forward.   Discussed importance of life balance--pt feels she is doing well with balancing activity and rest. Pt reports she is trying hard to engage more with others because she is aware it makes her feel better. Encouraged self care.   Suicidal/Homicidal: No  SI, HI, or AVH reported at time of session.  Therapist Response: [name] continues to sustain gains in overall mood regulation and anxiety management. Treatment showing good evolution and development.  Plan: Return again in 3 weeks. The  ongoing treatment plan includes maintaining current levels of progress and continuing to build skills to manage mood, improve stress/anxiety management, emotion regulation, distress tolerance, and behavior modification.   Diagnosis: Axis I: Major depressive disorder, recurrent, moderate.    Axis II: No diagnosis    Rachel Bo Keagan Anthis, LCSW 12/02/2019

## 2019-12-08 ENCOUNTER — Ambulatory Visit: Payer: PPO | Admitting: Pharmacist

## 2019-12-08 DIAGNOSIS — F17210 Nicotine dependence, cigarettes, uncomplicated: Secondary | ICD-10-CM

## 2019-12-08 DIAGNOSIS — I10 Essential (primary) hypertension: Secondary | ICD-10-CM

## 2019-12-08 DIAGNOSIS — F32A Depression, unspecified: Secondary | ICD-10-CM

## 2019-12-08 DIAGNOSIS — J449 Chronic obstructive pulmonary disease, unspecified: Secondary | ICD-10-CM

## 2019-12-08 DIAGNOSIS — Z720 Tobacco use: Secondary | ICD-10-CM

## 2019-12-08 MED ORDER — VARENICLINE TARTRATE 1 MG PO TABS: ORAL_TABLET | ORAL | 5 refills | Status: DC

## 2019-12-08 NOTE — Patient Instructions (Signed)
Visit Information  Goals Addressed              This Visit's Progress     Patient Stated   .  "I want to be healthy" (pt-stated)        CARE PLAN ENTRY (see longtitudinal plan of care for additional care plan information)  Current Barriers:  . Social, financial, community barriers: o Notes that she doesn't like that it is getting cold, but will get used to it.   o Notes that she is investigating alternative insurance plans. Has an Medical illustrator of a friend that she plans on talking to.  . Polypharmacy; complex patient with multiple comorbidities including chronic pain, depression, hx breast cancer, tobacco use disorder, alcohol abuse, HTN o Major depression: exacerbated by death of her daughter, [name]. Follows w/ Dr. Shea Evans; duloxetine 90 mg daily (30 mg x3) bupropion XL 450 mg daily, topiramate 25 mg BID (for antipsychotic-related weight gain) o [place]: Lunesta 1 mg QPM, PRN hydroxyzine; sleep continues to be better controlled o Chronic pain: Opana 10 mg BID; gabapentin 1200 mg TID (6 600 mg tabs daily); followed by pain management; o HTN: telmisartan 80 mg QAM, furosemide 40 mg daily PRN swelling o Hx breast cancer: letrozole 2.5 mg daily o Tobacco abuse/COPD: Stiolto daily, albuterol HFA PRN - APPROVED for Stiolto assistance through [employer] through 02/05/20- due to reapply today - Tobacco use: 1/2 ppd. Previous success w/ varenicline. Previous intolerance to adhesive of nicotine patch, but she isn't sure what brand of patch she had. Has not had a chance to investigate OTC benefits w/ [employer], but plans to take these things into consideration when reviewing insurance plans  Pharmacist Clinical Goal(s):  Marland Kitchen Over the next 90 days, patient will work with PharmD and provider towards optimized medication management  Interventions: . Comprehensive medication review performed, medication list updated in electronic medical record . Inter-disciplinary care team  collaboration (see longitudinal plan of care) . Will start process for reapplication for patient assistance for Stiolto. Will collaborate w/ CPhT to mail patient her portion of application. Will collaborate w/ provider for prescription portion.  Marland Kitchen Extensive discussion of tobacco cessation. Patient interested in trying varenicline, now that it is generic and available. Start varenicline 0.5 mg daily x 3 days, 0.5 mg BID x 4 days, then 1 mg BID thereafter. Take with food. Discussed contemplating quit date between days 8 and 35 of therapy. Discussed intended duration of 3-6 months after cessation. She will call [facility] to f/u on copay and let me know if still too expensive (insurance may not have changed to generic coverage yet). Counseled on GI upset . Provided contact information for [place] office  Patient Self Care Activities:  . Patient will take medications as prescribed  Please see past updates related to this goal by clicking on the "Past Updates" button in the selected goal         The patient verbalized understanding of instructions provided today and declined a print copy of patient instruction materials.    Plan:  - Scheduled f/u call in ~ 5 weeks  [name], PharmD, [place], [place] Pharmacist [place] [phone]

## 2019-12-08 NOTE — Chronic Care Management (AMB) (Signed)
Chronic Care Management   Follow Up Note   12/08/2019 Name: Ashley Cross MRN: [id number] DOB: Apr 19, 1955  Referred by: McLean-Scocuzza, Nino Glow, MD Reason for referral : Chronic Care Management (Medication Management)   Ashley Cross is a 64 y.o. year old female who is a primary care patient of McLean-Scocuzza, Nino Glow, MD. The CCM team was consulted for assistance with chronic disease management and care coordination needs.    Contacted patient for medication management review.   Review of patient status, including review of consultants reports, relevant laboratory and other test results, and collaboration with appropriate care team members and the patient's provider was performed as part of comprehensive patient evaluation and provision of chronic care management services.    SDOH (Social Determinants of Health) assessments performed: Yes See Care Plan activities for detailed interventions related to SDOH)  SDOH Interventions     Most Recent Value  SDOH Interventions  SDOH Interventions for the Following Domains Tobacco  Financial Strain Interventions Other (Comment)  [manufacturer assistance]  Tobacco Interventions Cessation Materials Given and Reviewed, Other (Comment)  [therapy initiated]       Outpatient Encounter Medications as of 12/08/2019  Medication Sig  . albuterol (VENTOLIN HFA) 108 (90 Base) MCG/ACT inhaler Inhale 1-2 puffs into the lungs every 4 (four) hours as needed for wheezing or shortness of breath.  Marland Kitchen azelastine (ASTELIN) 0.1 % nasal spray   . buPROPion (WELLBUTRIN XL) 150 MG 24 hr tablet Take 1 tablet (150 mg total) by mouth daily. To be combined with 300 mg daily in the AM  . buPROPion (WELLBUTRIN XL) 300 MG 24 hr tablet Take 1 tablet (300 mg total) by mouth daily. Further refills psych Dr. Shea Evans  . DULoxetine (CYMBALTA) 30 MG capsule TAKE 3 CAPSULES BY MOUTH EVERY DAY  . eszopiclone (LUNESTA) 1 MG TABS tablet TAKE ONE TABLET BY MOUTH  IMMEDIATELY BEFORE BEDTIME.  . fluticasone (FLONASE) 50 MCG/ACT nasal spray Place 2 sprays into both nostrils daily. Max 2 sprays  . furosemide (LASIX) 40 MG tablet Take 1 tablet (40 mg total) by mouth daily. In am D/c hctz/telmisartan combo pill  . gabapentin (NEURONTIN) 600 MG tablet Take 1,200 mg by mouth 3 (three) times daily.  . hydrOXYzine (VISTARIL) 25 MG capsule Take 1 capsule (25 mg total) by mouth every 8 (eight) hours as needed for anxiety.  Marland Kitchen ipratropium (ATROVENT) 0.06 % nasal spray Place 2 sprays into both nostrils 3 (three) times daily. prn  . letrozole (FEMARA) 2.5 MG tablet TAKE ONE TABLET BY MOUTH EVERY DAY  . metoprolol succinate (TOPROL-XL) 25 MG 24 hr tablet Take 1 tablet (25 mg total) by mouth daily.  . montelukast (SINGULAIR) 10 MG tablet Take 1 tablet (10 mg total) by mouth at bedtime.  . Multiple Vitamins-Minerals (MULTIVITAMIN) tablet Take 1 tablet by mouth daily.  Marland Kitchen omeprazole (PRILOSEC) 20 MG capsule   . oxymorphone (OPANA) 10 MG tablet Take 10 mg by mouth 2 (two) times daily.   Marland Kitchen senna-docusate (SENOKOT-S) 8.6-50 MG tablet Take 1-2 tablets by mouth daily as needed for mild constipation.  Marland Kitchen telmisartan (MICARDIS) 80 MG tablet Take 1 tablet (80 mg total) by mouth daily.  . Tiotropium Bromide-Olodaterol (STIOLTO RESPIMAT) 2.5-2.5 MCG/ACT AERS Inhale 2 puffs into the lungs daily.  Marland Kitchen topiramate (TOPAMAX) 25 MG tablet Take 1 tablet (25 mg total) by mouth 2 (two) times daily.  . varenicline (CHANTIX) 1 MG tablet Take 0.5 mg daily x 3 days, then 0.5 mg BID x 4 days,  then 1 mg BID thereafter. TAKE WITH FOOD.   No facility-administered encounter medications on file as of 12/08/2019.     Objective:   Goals Addressed              This Visit's Progress     Patient Stated   .  "I want to be healthy" (pt-stated)        CARE PLAN ENTRY (see longtitudinal plan of care for additional care plan information)  Current Barriers:  . Social, financial, community  barriers: o Notes that she doesn't like that it is getting cold, but will get used to it.   o Notes that she is investigating alternative insurance plans. Has an Medical illustrator of a friend that she plans on talking to.  . Polypharmacy; complex patient with multiple comorbidities including chronic pain, depression, hx breast cancer, tobacco use disorder, alcohol abuse, HTN o Major depression: exacerbated by death of her daughter, [name]. Follows w/ Dr. Shea Evans; duloxetine 90 mg daily (30 mg x3) bupropion XL 450 mg daily, topiramate 25 mg BID (for antipsychotic-related weight gain) o Insomina: Lunesta 1 mg QPM, PRN hydroxyzine; sleep continues to be better controlled o Chronic pain: Opana 10 mg BID; gabapentin 1200 mg TID (6 600 mg tabs daily); followed by pain management; o HTN: telmisartan 80 mg QAM, furosemide 40 mg daily PRN swelling o Hx breast cancer: letrozole 2.5 mg daily o Tobacco abuse/COPD: Stiolto daily, albuterol HFA PRN - APPROVED for Stiolto assistance through [employer] through 02/05/20- due to reapply today - Tobacco use: 1/2 ppd. Previous success w/ varenicline. Previous intolerance to adhesive of nicotine patch, but she isn't sure what brand of patch she had. Has not had a chance to investigate OTC benefits w/ [employer], but plans to take these things into consideration when reviewing insurance plans  Pharmacist Clinical Goal(s):  Marland Kitchen Over the next 90 days, patient will work with PharmD and provider towards optimized medication management  Interventions: . Comprehensive medication review performed, medication list updated in electronic medical record . Inter-disciplinary care team collaboration (see longitudinal plan of care) . Will start process for reapplication for patient assistance for Stiolto. Will collaborate w/ CPhT to mail patient her portion of application. Will collaborate w/ provider for prescription portion.  Marland Kitchen Extensive discussion of tobacco  cessation. Patient interested in trying varenicline, now that it is generic and available. Start varenicline 0.5 mg daily x 3 days, 0.5 mg BID x 4 days, then 1 mg BID thereafter. Take with food. Discussed contemplating quit date between days 8 and 35 of therapy. Discussed intended duration of 3-6 months after cessation. She will call [facility] to f/u on copay and let me know if still too expensive (insurance may not have changed to generic coverage yet). Counseled on GI upset . Provided contact information for [place] office  Patient Self Care Activities:  . Patient will take medications as prescribed  Please see past updates related to this goal by clicking on the "Past Updates" button in the selected goal          Plan:  - Scheduled f/u call in ~ 5 weeks  [name], PharmD, [place], Gerty Pharmacist [facility] [phone]

## 2019-12-10 ENCOUNTER — Telehealth (INDEPENDENT_AMBULATORY_CARE_PROVIDER_SITE_OTHER): Payer: PPO | Admitting: Psychiatry

## 2019-12-10 ENCOUNTER — Encounter: Payer: Self-pay | Admitting: Psychiatry

## 2019-12-10 ENCOUNTER — Other Ambulatory Visit: Payer: Self-pay

## 2019-12-10 DIAGNOSIS — F331 Major depressive disorder, recurrent, moderate: Secondary | ICD-10-CM | POA: Diagnosis not present

## 2019-12-10 DIAGNOSIS — F17200 Nicotine dependence, unspecified, uncomplicated: Secondary | ICD-10-CM | POA: Diagnosis not present

## 2019-12-10 DIAGNOSIS — F1010 Alcohol abuse, uncomplicated: Secondary | ICD-10-CM | POA: Diagnosis not present

## 2019-12-10 MED ORDER — BUPROPION HCL ER (XL) 150 MG PO TB24: 150.0000 mg | ORAL_TABLET | Freq: Every day | ORAL | 0 refills | Status: DC

## 2019-12-10 MED ORDER — ZIPRASIDONE HCL 40 MG PO CAPS: 40.0000 mg | ORAL_CAPSULE | Freq: Every day | ORAL | 1 refills | Status: DC

## 2019-12-10 NOTE — Progress Notes (Signed)
Virtual Visit via Video Note  I connected with Ashley Cross on 12/10/19 at 10:00 AM EDT by a video enabled telemedicine application and verified that I am speaking with the correct person using two identifiers.  Location Provider Location : [facility] Patient Location : Home  Participants: Patient , Provider    I discussed the limitations of evaluation and management by telemedicine and the availability of in person appointments. The patient expressed understanding and agreed to proceed.    I discussed the assessment and treatment plan with the patient. The patient was provided an opportunity to ask questions and all were answered. The patient agreed with the plan and demonstrated an understanding of the instructions.   The patient was advised to call back or seek an in-person evaluation if the symptoms worsen or if the condition fails to improve as anticipated.  Video connection was lost at less than 50% of the duration of the visit, at which time the remainder of the visit was completed through audio only   [name] MD OP Progress Note  12/10/2019 10:42 AM [name]  MRN:  [id number]  Chief Complaint:  Chief Complaint    Follow-up     HPI: Ashley Cross is a 64 year old Caucasian female on disability, lives in [place], has a history of MDD, alcohol use disorder, history of breast cancer, chronic pain, hypertension was evaluated by telemedicine.  Patient today reports she is currently feeling unmotivated, reports she sits around unable to do anything several days a week.  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  She reports she hence does not know what she is supposed to do anymore.  It feels like she is waiting for her client to return.  Patient reports she does not feel depressed however does have some anxiety.  Patient reports sleep is good.  She has stopped  taking the Abilify.  She reports that has tremendously helped with some of the side effects that she was struggling with like increased appetite.    Patient denies any suicidality, homicidality or perceptual disturbances.  She is interested in starting Geodon today.  She is compliant on all her other medications and denies side effects.  Patient denies any other concerns today.  Visit Diagnosis:    ICD-10-CM   1. MDD (major depressive disorder), recurrent episode, moderate (HCC)  F33.1 ziprasidone (GEODON) 40 MG capsule    buPROPion (WELLBUTRIN XL) 150 MG 24 hr tablet  2. Alcohol use disorder, mild, abuse  F10.10   3. Tobacco use disorder  F17.200     Past Psychiatric History: I have reviewed past psychiatric history from my progress note on 09/16/2018.  Past trials of Zoloft, Wellbutrin, Lexapro, Celexa  Past Medical History:  Past Medical History:  Diagnosis Date  . Anxiety   . Breast cancer ([place])    Left breast(nipple mass) 2018  . Cervical stenosis of spine   . Chicken pox   . Chronic back pain   . Colon polyps   . COPD (chronic obstructive pulmonary disease) ([place])   . Cyst of left nipple   . Depression   . Hypertension   . Insomnia   . Personal history of radiation therapy     Past Surgical History:  Procedure Laterality Date  . BACK SURGERY     x 3 (2010 x 2; 2013 x 1)   . BREAST BIOPSY Left  core- neg  . BREAST LUMPECTOMY Left 2016-04-30  . BREAST SURGERY Left    breast biopsy  . CERVICAL CONE BIOPSY    . CERVICAL FUSION     times 2  . COLONOSCOPY WITH PROPOFOL N/A 04/18/2015   Procedure: COLONOSCOPY WITH PROPOFOL;  Surgeon: [name], MD;  Location: [facility] ENDOSCOPY;  Service: Endoscopy;  Laterality: N/A;  . ELBOW SURGERY     04-30-12 b/l elbows per pt ulnar nerve   . ELBOW SURGERY     x2   . EPIDURAL BLOCK INJECTION     Dr. Maryjean Ka   . MASS EXCISION Left 04/12/2016   Procedure: EXCISION OF LEFT NIPPLE MASS;  Surgeon: Stark Klein, MD;  Location: [place];  Service: General;  Laterality: Left;  . NECK SURGERY    . POSTERIOR CERVICAL FUSION/FORAMINOTOMY  03/07/2011   Procedure: POSTERIOR CERVICAL FUSION/FORAMINOTOMY LEVEL 4;  Surgeon: Eustace Moore, MD;  Location: [facility];  Service: Neurosurgery;  Laterality: N/A;  cervical three to thoracic one posterior cervical fusion   . SENTINEL NODE BIOPSY Left 05/29/2016   Procedure: LEFT SENTINEL LYMPH NODE BIOPSY;  Surgeon: Stark Klein, MD;  Location: [place];  Service: General;  Laterality: Left;  . TONSILLECTOMY      Family Psychiatric History: I have reviewed family psychiatric history from my progress note on 09/16/2018  Family History:  Family History  Problem Relation Age of Onset  . Breast cancer Mother 58  . Hypertension Mother   . Cancer Mother        breast dx'ed 40   . Cancer Father        prostate  . Hypertension Father   . Drug abuse Daughter   . Hypertension Maternal Grandmother   . Breast cancer Cousin     Social History: I have reviewed social history from my progress note on 09/16/2018 Social History   Socioeconomic History  . Marital status: Divorced    Spouse name: Not on file  . Number of children: 2  . Years of education: Not on file  . Highest education level: Not on file  Occupational History  . Not on file  Tobacco Use  . Smoking status: Current Every Day Smoker    Packs/day: 0.25    Years: 30.00    Pack years: 7.50    Types: Cigarettes  . Smokeless tobacco: Never Used  . Tobacco comment: currently 6 cigarettes per day reported on 12/10/2019  Vaping Use  . Vaping Use: Every day  Substance and Sexual Activity  . Alcohol use: Yes    Comment: social  . Drug use: No    Comment: on opana since jan 2018  . Sexual activity: Not on file  Other Topics Concern  . Not on file  Social History Narrative   As of 01/25/17 not sexually active of in relationship    Divorced   2 kids son and daughter (though daughter died of suicide in 04-30-17)    Son lives in [place] now as of 12/2018       Disability    Loves animals has 3 dogs           Social Determinants of Radio broadcast assistant Strain: Medium Risk  . Difficulty of Paying Living Expenses: Somewhat hard  Food Insecurity:   . Worried About Charity fundraiser in the Last Year: Not on file  . Ran Out of Food in the Last Year: Not on file  Transportation Needs:   . Film/video editor (Medical): Not on file  . Lack of Transportation (Non-Medical): Not on file  Physical Activity:   . Days of Exercise per Week: Not on file  . Minutes of Exercise per Session: Not on file  Stress:   . Feeling of Stress : Not on file  Social Connections:   . Frequency of Communication with Friends and Family: Not on file  . Frequency of Social Gatherings with Friends and Family: Not on file  . Attends Religious Services: Not on file  . Active Member of Clubs or Organizations: Not on file  . Attends Archivist Meetings: Not on file  . Marital Status: Not on file    Allergies:  Allergies  Allergen Reactions  . Augmentin [Amoxicillin-Pot Clavulanate]     Diarrhea   . Norvasc [Amlodipine]     Swelling   . Sulfa Antibiotics Itching and Rash  . Sulfonamide Derivatives Itching and Rash    Metabolic Disorder Labs: Lab Results  Component Value Date   HGBA1C 5.1 02/27/2019   No results found for: PROLACTIN Lab Results  Component Value Date   CHOL 203 (H) 08/25/2019   TRIG 77.0 08/25/2019   HDL 102.30 08/25/2019   CHOLHDL 2 08/25/2019   VLDL 15.4 08/25/2019   LDLCALC 85 08/25/2019   LDLCALC 74 02/27/2019   Lab Results  Component Value Date   TSH 0.71 08/25/2019   TSH 2.91 04/03/2019    Therapeutic Level Labs: No results found for: LITHIUM No results found for: VALPROATE No components found for:  CBMZ  Current Medications: Current Outpatient Medications  Medication Sig Dispense Refill  . albuterol (VENTOLIN HFA) 108 (90 Base) MCG/ACT inhaler  Inhale 1-2 puffs into the lungs every 4 (four) hours as needed for wheezing or shortness of breath. 18 g 12  . azelastine (ASTELIN) 0.1 % nasal spray     . buPROPion (WELLBUTRIN XL) 150 MG 24 hr tablet Take 1 tablet (150 mg total) by mouth daily. To be combined with 300 mg daily in the AM 90 tablet 0  . buPROPion (WELLBUTRIN XL) 300 MG 24 hr tablet Take 1 tablet (300 mg total) by mouth daily. Further refills psych Dr. Shea Evans 90 tablet 1  . DULoxetine (CYMBALTA) 30 MG capsule TAKE 3 CAPSULES BY MOUTH EVERY DAY 270 capsule 0  . eszopiclone (LUNESTA) 1 MG TABS tablet TAKE ONE TABLET BY MOUTH IMMEDIATELY BEFORE BEDTIME. 30 tablet 2  . fluticasone (FLONASE) 50 MCG/ACT nasal spray Place 2 sprays into both nostrils daily. Max 2 sprays 16 g 11  . furosemide (LASIX) 40 MG tablet Take 1 tablet (40 mg total) by mouth daily. In am D/c hctz/telmisartan combo pill 90 tablet 1  . gabapentin (NEURONTIN) 600 MG tablet Take 1,200 mg by mouth 3 (three) times daily.    . hydrOXYzine (VISTARIL) 25 MG capsule Take 1 capsule (25 mg total) by mouth every 8 (eight) hours as needed for anxiety. 90 capsule 1  . ipratropium (ATROVENT) 0.06 % nasal spray Place 2 sprays into both nostrils 3 (three) times daily. prn 15 mL 12  . letrozole (FEMARA) 2.5 MG tablet TAKE ONE TABLET BY MOUTH EVERY DAY 90 tablet 0  . metoprolol succinate (TOPROL-XL) 25 MG 24 hr tablet Take 1 tablet (25 mg total) by mouth daily. 90 tablet 3  . montelukast (SINGULAIR) 10 MG tablet Take 1 tablet (10 mg total) by mouth at bedtime. 90 tablet 3  . Multiple Vitamins-Minerals (MULTIVITAMIN) tablet Take 1  tablet by mouth daily. 90 tablet 3  . omeprazole (PRILOSEC) 20 MG capsule     . oxymorphone (OPANA) 10 MG tablet Take 10 mg by mouth 2 (two) times daily.     Marland Kitchen senna-docusate (SENOKOT-S) 8.6-50 MG tablet Take 1-2 tablets by mouth daily as needed for mild constipation. 180 tablet 1  . telmisartan (MICARDIS) 80 MG tablet Take 1 tablet (80 mg total) by mouth  daily. 90 tablet 3  . Tiotropium Bromide-Olodaterol (STIOLTO RESPIMAT) 2.5-2.5 MCG/ACT AERS Inhale 2 puffs into the lungs daily. 4 g 11  . topiramate (TOPAMAX) 25 MG tablet Take 1 tablet (25 mg total) by mouth 2 (two) times daily. 60 tablet 1  . varenicline (CHANTIX) 1 MG tablet Take 0.5 mg daily x 3 days, then 0.5 mg BID x 4 days, then 1 mg BID thereafter. TAKE WITH FOOD. 56 tablet 5  . ziprasidone (GEODON) 40 MG capsule Take 1 capsule (40 mg total) by mouth daily with supper. 30 capsule 1   No current facility-administered medications for this visit.     Musculoskeletal: Strength & Muscle Tone: UTA Gait & Station: normal Patient leans: N/A  Psychiatric Specialty Exam: Review of Systems  Psychiatric/Behavioral: The patient is nervous/anxious.   All other systems reviewed and are negative.   There were no vitals taken for this visit.There is no height or weight on file to calculate BMI.  General Appearance: Casual  Eye Contact:  Fair  Speech:  Clear and Coherent  Volume:  Normal  Mood:  Anxious, lack of motivation  Affect:  Congruent  Thought Process:  Goal Directed and Descriptions of Associations: Intact  Orientation:  Full (Time, Place, and Person)  Thought Content: Logical   Suicidal Thoughts:  No  Homicidal Thoughts:  No  Memory:  Immediate;   Fair Recent;   Fair Remote;   Fair  Judgement:  Fair  Insight:  Fair  Psychomotor Activity:  Normal  Concentration:  Concentration: Fair and Attention Span: Fair  Recall:  [employer] of Knowledge: Fair  Language: Fair  Akathisia:  No  Handed:  Right  AIMS (if indicated): UTA  Assets:  Communication Skills Desire for Improvement Housing  ADL's:  Intact  Cognition: WNL  Sleep:  Fair   Screenings: GAD-7     Office Visit from 06/12/2019 in [place] Visit from 09/30/2017 in [facility]  Total GAD-7 Score 18 18    PHQ2-9     Office Visit from 06/12/2019 in [place] from 12/31/2018 in [facility] from 09/16/2018 in [facility] Visit from 09/30/2017 in [facility] Office Visit from 08/19/2017 in [place] [facility]  PHQ-2 Total Score 4 0 0 4 4  PHQ-9 Total Score 17 -- -- 18 16       Assessment and Plan: Rainbow [place] is a 64 year old Caucasian female, divorced, disabled, has a history of depression, chronic pain, hypertension, history of breast cancer was evaluated by telemedicine today.  She is biologically predisposed given her family history of substance abuse problems.  Patient with psychosocial stressors of health issues, financial problems, death of her daughter 2 years ago.  Patient is currently struggling with anxiety, lack of motivation and will benefit from medication readjustment and continued CBT.  Plan MDD-unstable Cymbalta 90 mg p.o. daily Wellbutrin XL release 450 mg p.o. daily Discontinue Abilify for noncompliance and side effects. Start Geodon 40 mg  p.o. daily with supper Continue Topamax 25 mg p.o. twice daily. Continue Lunesta 1 mg p.o. nightly for sleep Hydroxyzine 25 mg p.o. daily as needed for severe anxiety.  Alcohol use disorder mild in early remission Patient currently uses it socially.  Tobacco use disorder-unstable Provided counseling, she is currently trying to cut back.  Discussed referral for [place], IOP.  Patient declines.  Follow-up in clinic in 3 to 4 weeks or sooner if needed.  I have spent atleast 20 minutes face to face by video with patient today. More than 50 % of the time was spent for preparing to see the patient ( e.g., review of test, records ),ordering medications and test ,psychoeducation and supportive psychotherapy and care coordination,as well as documenting clinical information in electronic health record. This note was generated in part or whole with voice recognition software.  Voice recognition is usually quite accurate but there are transcription errors that can and very often do occur. I apologize for any typographical errors that were not detected and corrected.        Ursula Alert, MD 12/10/2019, 10:42 AM

## 2019-12-18 ENCOUNTER — Other Ambulatory Visit (INDEPENDENT_AMBULATORY_CARE_PROVIDER_SITE_OTHER): Payer: PPO

## 2019-12-18 ENCOUNTER — Other Ambulatory Visit: Payer: Self-pay

## 2019-12-18 DIAGNOSIS — R7303 Prediabetes: Secondary | ICD-10-CM

## 2019-12-18 DIAGNOSIS — I10 Essential (primary) hypertension: Secondary | ICD-10-CM

## 2019-12-18 LAB — COMPREHENSIVE METABOLIC PANEL
ALT: 13 U/L (ref 0–35)
AST: 14 U/L (ref 0–37)
Albumin: 4.1 g/dL (ref 3.5–5.2)
Alkaline Phosphatase: 92 U/L (ref 39–117)
BUN: 13 mg/dL (ref 6–23)
CO2: 33 mEq/L — ABNORMAL HIGH (ref 19–32)
Calcium: 9.5 mg/dL (ref 8.4–10.5)
Chloride: 95 mEq/L — ABNORMAL LOW (ref 96–112)
Creatinine, Ser: 0.79 mg/dL (ref 0.40–1.20)
GFR: 79.56 mL/min (ref >60.00)
Glucose, Bld: 97 mg/dL (ref 70–99)
Potassium: 3.8 mEq/L (ref 3.5–5.1)
Sodium: 139 mEq/L (ref 135–145)
Total Bilirubin: 0.7 mg/dL (ref 0.2–1.2)
Total Protein: 6.8 g/dL (ref 6.0–8.3)

## 2019-12-18 LAB — CBC WITH DIFFERENTIAL/PLATELET
Basophils Absolute: 0.1 10*3/uL (ref 0.0–0.1)
Basophils Relative: 0.7 % (ref 0.0–3.0)
Eosinophils Absolute: 0.1 10*3/uL (ref 0.0–0.7)
Eosinophils Relative: 0.6 % (ref 0.0–5.0)
HCT: 40.2 % (ref 36.0–46.0)
Hemoglobin: 13.5 g/dL (ref 12.0–15.0)
Lymphocytes Relative: 10.7 % — ABNORMAL LOW (ref 12.0–46.0)
Lymphs Abs: 1.1 10*3/uL (ref 0.7–4.0)
MCHC: 33.5 g/dL (ref 30.0–36.0)
MCV: 95.2 fl (ref 78.0–100.0)
Monocytes Absolute: 0.8 10*3/uL (ref 0.1–1.0)
Monocytes Relative: 7.9 % (ref 3.0–12.0)
Neutro Abs: 8.2 10*3/uL — ABNORMAL HIGH (ref 1.4–7.7)
Neutrophils Relative %: 80.1 % — ABNORMAL HIGH (ref 43.0–77.0)
Platelets: 205 10*3/uL (ref 150.0–400.0)
RBC: 4.23 Mil/uL (ref 3.87–5.11)
RDW: 13.3 % (ref 11.5–15.5)
WBC: 10.3 10*3/uL (ref 4.0–10.5)

## 2019-12-18 LAB — HEMOGLOBIN A1C: Hgb A1c MFr Bld: 5.8 % (ref 4.6–6.5)

## 2019-12-22 ENCOUNTER — Other Ambulatory Visit: Payer: Self-pay

## 2019-12-22 ENCOUNTER — Telehealth: Payer: Self-pay | Admitting: Internal Medicine

## 2019-12-22 DIAGNOSIS — I10 Essential (primary) hypertension: Secondary | ICD-10-CM

## 2019-12-22 MED ORDER — FUROSEMIDE 40 MG PO TABS: 40.0000 mg | ORAL_TABLET | Freq: Every day | ORAL | 1 refills | Status: DC

## 2019-12-22 NOTE — Telephone Encounter (Signed)
Patient returned office phone call to get lab results.

## 2019-12-22 NOTE — Telephone Encounter (Signed)
Patient informed and verbalized understanding

## 2019-12-23 ENCOUNTER — Ambulatory Visit: Payer: PPO | Admitting: Licensed Clinical Social Worker

## 2020-01-06 ENCOUNTER — Telehealth: Payer: Self-pay | Admitting: Pharmacist

## 2020-01-06 ENCOUNTER — Telehealth: Payer: PPO

## 2020-01-06 NOTE — Telephone Encounter (Signed)
°  Chronic Care Management   Note  01/06/2020 Name: Ashley Cross MRN: [id number] DOB: February 10, 1955   Attempted to contact patient for scheduled appointment for medication management support. Left HIPAA compliant message for patient to return my call at their convenience.    Plan: - If I do not hear back from the patient by end of business today, will collaborate with Care Guide to outreach to schedule follow up with me  [name], PharmD, [place], [place] Pharmacist [facility] [place] [phone]

## 2020-01-08 ENCOUNTER — Other Ambulatory Visit: Payer: Self-pay

## 2020-01-08 ENCOUNTER — Telehealth (INDEPENDENT_AMBULATORY_CARE_PROVIDER_SITE_OTHER): Payer: PPO | Admitting: Psychiatry

## 2020-01-08 DIAGNOSIS — Z5329 Procedure and treatment not carried out because of patient's decision for other reasons: Secondary | ICD-10-CM

## 2020-01-08 NOTE — Progress Notes (Signed)
No response to call or text or video invite  

## 2020-01-12 ENCOUNTER — Telehealth: Payer: Self-pay

## 2020-01-12 NOTE — Telephone Encounter (Signed)
Pt has been r/s, also states that she sent paper work back in a long time ago. Pt states to inform her if not received

## 2020-01-12 NOTE — Telephone Encounter (Signed)
Patient called in to set up an appointment with [name]

## 2020-01-12 NOTE — Chronic Care Management (AMB) (Signed)
  Care Management   Note  01/12/2020 Name: Ashley Cross MRN: [id number] DOB: July 31, 1955  Ashley Cross is a 64 y.o. year old female who is a primary care patient of McLean-Scocuzza, Nino Glow, MD and is actively engaged with the care management team. I reached out to Ashley Cross by phone today to assist with re-scheduling a follow up visit with the Pharmacist  Follow up plan: Unsuccessful telephone outreach attempt made. A HIPAA compliant phone message was left for the patient providing contact information and requesting a return call.  The care management team will reach out to the patient again over the next 7 days.  If patient returns call to provider office, please advise to call [facility]  at [place], [place], [place], [place] [address] Direct Dial: [phone] [email or web address]: [email or web address]

## 2020-01-12 NOTE — Chronic Care Management (AMB) (Signed)
  Care Management   Note  01/12/2020 Name: Shakeerah Gradel MRN: [id number] DOB: 10-02-1955  Ashley Cross is a 64 y.o. year old female who is a primary care patient of McLean-Scocuzza, Nino Glow, MD and is actively engaged with the care management team. I reached out to [name] by phone today to assist with re-scheduling a follow up visit with the Pharmacist  Follow up plan: Telephone appointment with care management team member scheduled for:02/08/2019  Noreene Larsson, [place], [place], [place] [address] Direct Dial: [phone] [email or web address]: [email or web address]

## 2020-01-14 ENCOUNTER — Other Ambulatory Visit: Payer: Self-pay | Admitting: Psychiatry

## 2020-01-14 DIAGNOSIS — F331 Major depressive disorder, recurrent, moderate: Secondary | ICD-10-CM

## 2020-01-28 ENCOUNTER — Telehealth: Payer: Self-pay | Admitting: Internal Medicine

## 2020-01-28 NOTE — Telephone Encounter (Signed)
Pt called she was bite by a cat earlier today and wanted an in office appt  Told pt that she needed to go to urgent care she said she would go

## 2020-02-04 ENCOUNTER — Other Ambulatory Visit: Payer: Self-pay | Admitting: Psychiatry

## 2020-02-04 DIAGNOSIS — F331 Major depressive disorder, recurrent, moderate: Secondary | ICD-10-CM

## 2020-02-08 ENCOUNTER — Ambulatory Visit: Payer: Medicare Other | Admitting: Pharmacist

## 2020-02-08 DIAGNOSIS — J449 Chronic obstructive pulmonary disease, unspecified: Secondary | ICD-10-CM

## 2020-02-08 DIAGNOSIS — Z720 Tobacco use: Secondary | ICD-10-CM

## 2020-02-08 DIAGNOSIS — F419 Anxiety disorder, unspecified: Secondary | ICD-10-CM

## 2020-02-08 DIAGNOSIS — I10 Essential (primary) hypertension: Secondary | ICD-10-CM

## 2020-02-08 NOTE — Patient Instructions (Addendum)
Ashley Cross,   It was great talking to you today!  When you have 2-4 weeks left of Stiolto, please call [facility] to refill.   If you haven't already, make sure and reschedule your follow up with Dr. Shea Evans.   Call me anytime you have issues with medication costs, and I'll see what I can do to help.   Take care!  [name], PharmD [phone]   Visit Information  Patient Care Plan: Medication Management    Problem Identified: COPD, Depression/Anxiety, Tobacco Abuse     Long-Range Goal: Disease Progression Prevention   This Visit's Progress: On track  Priority: High  Note:   Current Barriers:  . Unable to independently afford treatment regimen . Unable to achieve control of tobacco   Pharmacist Clinical Goal(s):  Marland Kitchen Over the next 90 days, patient will verbalize ability to afford treatment regimen . Over the next 90 days, patient will achieve reduction in tobacco use through collaboration with PharmD and provider.   Interventions: . 1:1 collaboration with McLean-Scocuzza, Nino Glow, MD regarding development and update of comprehensive plan of care as evidenced by provider attestation and co-signature . Inter-disciplinary care team collaboration (see longitudinal plan of care) . Comprehensive medication review performed; medication list updated in electronic medical record  Depression/Anxiety, Insomnia . Moderately well managed; reports she has missed her last psychiatry appt and need to r/s with Dr. Shea Evans; current treatment: did not pick up ziprasidone d/t cost; bupropion XL 450 mg daily (300 mg + 150 mg), duloxetine 90 mg daily (3 30 mg), hydroxyzine 25 mg PRN, eszopiclone 3 mg QPM PRN; not taking topiramate 25 mg for antipsychotic-associated weight gain . Patient notes she would like to discuss consolidation of her regimen w/ Dr. Shea Evans . Provided empathetic listening as patient discussed the holidays being a difficult time (loss of daughter ~ 2 years  ago) . Discussed that bupropion XL 450 mg is generally more expensive than 300 + 150. Discussed that duloxetine does not come as a 90 mg dosage form. Encouraged to connect w/ Dr. Charlcie Cradle office for r/s of appointment for evaluation and discussion of next steps.   Chronic Pain: . Controlled; current regimen: oxymorphone 10 mg BID, gabapentin 1200 mg TID; followed by pain management . Recommend to continue current regimen at this time  Chronic Obstructive Pulmonary Disease with allergies: . Controlled; current treatment: Stiolto 2.5/2.5 mcg 2 puffs daily, albuterol HFA PRN, montelukast 10 mg daily, fluticasone nasal spray, azelastine nasal spray . Notes that she received shipment of Stiolto from [facility]. Appreciative of re-enrollment for 2022. Marland Kitchen Recommend to continue current regimen at this time  Tobacco Abuse: . ~ 0.75 packs per day; does smoke within 30 minutes of waking up. 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  . Prescribed varenicline, but has not picked up yet. Noted that the holidays were too difficult of a time to focus on cessation . Encouraged continued focus on activities to do at home to relieve stress other than smoking. Recommended starting varenicline. Discussed mechanism of action. Reviewed importance of cessation for her health  Hypertension: . Controlled; current treatment: telmisartan 80 mg daily, furosemide 40 mg daily . Recommend to continue current regimen at this time  Hx breast cancer: . Appropriately managed; current regimen: letrozole 2.5 mg daily . Recommend to continue current regimen at this time   Patient Goals/Self-Care Activities . Over the next 90 days, patient will:  - take medications as prescribed  collaborate with provider on medication access solutions Focus on reduction in tobacco cessation  Follow Up Plan: Telephone follow up appointment with care management team member scheduled for: ~ 6  weeks     The patient verbalized understanding of instructions, educational materials, and care plan provided today and declined offer to receive copy of patient instructions, educational materials, and care plan.   Plan: Telephone follow up appointment with care management team member scheduled for:~ 6 weeks  [name] Feliz Beam, PharmD, [place], CPP Clinical Pharmacist [facility] at [employer] [phone]
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
## 2020-02-20 ENCOUNTER — Other Ambulatory Visit: Payer: Self-pay | Admitting: Psychiatry

## 2020-02-20 DIAGNOSIS — F331 Major depressive disorder, recurrent, moderate: Secondary | ICD-10-CM

## 2020-02-29 ENCOUNTER — Ambulatory Visit (INDEPENDENT_AMBULATORY_CARE_PROVIDER_SITE_OTHER): Payer: Medicare Other | Admitting: Licensed Clinical Social Worker

## 2020-02-29 ENCOUNTER — Other Ambulatory Visit: Payer: Self-pay

## 2020-02-29 DIAGNOSIS — F331 Major depressive disorder, recurrent, moderate: Secondary | ICD-10-CM

## 2020-02-29 NOTE — Progress Notes (Signed)
Virtual Visit via Video Note  I connected with Ashley Cross on 02/29/20 at  9:00 AM EST by a video enabled telemedicine application and verified that I am speaking with the correct person using two identifiers.  Location: Patient: home Provider: [facility]   I discussed the limitations of evaluation and management by telemedicine and the availability of in person appointments. The patient expressed understanding and agreed to proceed.   The patient was advised to call back or seek an in-person evaluation if the symptoms worsen or if the condition fails to improve as anticipated.  I provided 45 minutes of non-face-to-face time during this encounter.   Ashley Cross R Kieon Lawhorn, LCSW    THERAPIST PROGRESS NOTE  Session Time: 9-9:45a  Participation Level: Active  Behavioral Response: NeatAlertDepressed  Type of Therapy: Individual Therapy  Treatment Goals addressed: Coping  Interventions: CBT and Supportive  Summary: Ashley Cross is a 65 y.o. female who presents with improving symptoms related to major depressive disorder diagnosis. Pt reports that her mood is stable at time of session. Pt reports that she is getting fair quality and quantity of sleep. Reviewed sleep hygiene tips.   Allowed pt to explore and express recent stressors--pt has significant financial stressors at time of assessment. Pt explored purchases that have been made throughout the years--pt feels that as part of managing her grief, pt made some cosmetic changes to her home that have set her behind financially. Pt is currently exploring options and interventions to help her feel more secure.   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 Pt feels that this extra income is needed and appreciated. Helped pt recognize how valued she is to others.   Pt recovering after a recent illness "I didn't get tested but I'm sure it was the flu". Pt had fever for days,  low appetite, body aches. Pt feeling much better now and has strength back.  Allowed pt to process through current thoughts/feelings about grief/loss of daughter. Continued to support pt unconditionally and allowed pt to express in safe space.  Encouraged self care, continuing positive social engagements, and increasing activity levels.   Suicidal/Homicidal: No  Therapist Response: Ashley Cross is taking initiative to make positive changes towards her financial stressors/concerns. Pt has some appointments linked with financial advisor/attorney. Ashley Cross is processing through thoughts and feelings and is able to express them without getting overly emotional in session, which is reflective of progress. Treatment to continue as indicated.   Plan: Return again in 4 weeks. Revised treatment plan.   Diagnosis: Axis I: Major Depression, Recurrent severe    Axis II: No diagnosis    Ashley Bo Artia Singley, LCSW 02/29/2020

## 2020-03-07 ENCOUNTER — Other Ambulatory Visit: Payer: Self-pay | Admitting: Internal Medicine

## 2020-03-07 DIAGNOSIS — R0982 Postnasal drip: Secondary | ICD-10-CM

## 2020-03-07 DIAGNOSIS — R0981 Nasal congestion: Secondary | ICD-10-CM

## 2020-03-15 ENCOUNTER — Other Ambulatory Visit: Payer: Self-pay

## 2020-03-15 ENCOUNTER — Telehealth (INDEPENDENT_AMBULATORY_CARE_PROVIDER_SITE_OTHER): Payer: Medicare Other | Admitting: Psychiatry

## 2020-03-15 ENCOUNTER — Telehealth: Payer: Self-pay | Admitting: Internal Medicine

## 2020-03-15 ENCOUNTER — Encounter: Payer: Self-pay | Admitting: Psychiatry

## 2020-03-15 ENCOUNTER — Telehealth (HOSPITAL_BASED_OUTPATIENT_CLINIC_OR_DEPARTMENT_OTHER): Payer: Self-pay | Admitting: Psychiatry

## 2020-03-15 DIAGNOSIS — F1010 Alcohol abuse, uncomplicated: Secondary | ICD-10-CM | POA: Diagnosis not present

## 2020-03-15 DIAGNOSIS — F331 Major depressive disorder, recurrent, moderate: Secondary | ICD-10-CM | POA: Diagnosis not present

## 2020-03-15 DIAGNOSIS — F17200 Nicotine dependence, unspecified, uncomplicated: Secondary | ICD-10-CM

## 2020-03-15 DIAGNOSIS — Z5329 Procedure and treatment not carried out because of patient's decision for other reasons: Secondary | ICD-10-CM

## 2020-03-15 MED ORDER — TOPIRAMATE 50 MG PO TABS: 50.0000 mg | ORAL_TABLET | Freq: Two times a day (BID) | ORAL | 1 refills | Status: DC

## 2020-03-15 NOTE — Progress Notes (Signed)
Virtual Visit via Video Note  I connected with Ashley Cross on 03/15/20 at  4:00 PM EST by a video enabled telemedicine application and verified that I am speaking with the correct person using two identifiers.  Location Provider Location : [facility] Patient Location : Home  Participants: Patient , Provider   I discussed the limitations of evaluation and management by telemedicine and the availability of in person appointments. The patient expressed understanding and agreed to proceed.  I discussed the assessment and treatment plan with the patient. The patient was provided an opportunity to ask questions and all were answered. The patient agreed with the plan and demonstrated an understanding of the instructions.   The patient was advised to call back or seek an in-person evaluation if the symptoms worsen or if the condition fails to improve as anticipated.   [place] MD OP Progress Note  03/15/2020 5:03 PM Hailei Besser  MRN:  [id number]  Chief Complaint:  Chief Complaint    Follow-up     HPI: Ashley Cross is a 65 year old Caucasian female on disability, lives in [place], has a history of MDD, alcohol use disorder, history of breast cancer, chronic pain, hypertension was evaluated by telemedicine today.  Patient today reports she had an episode of crying spell the last 2 days.  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  She reports she went through some pictures and that was too much for her to handle.  She reports she just could not cope with her grief and cried a lot.  Patient became very tearful when she discussed this.  Patient reports she has been talking to her therapist who has been very helpful.  Patient also reports sleep problems.  Patient reports going through episodes of not being able to sleep for 2 weeks at a time.  Patient reports she has been using an extra dosage of Lunesta when she wakes up  in the middle of the night and that also does not seem to work.  Patient does report she has been using alcohol, 2 to 3 glasses of wine every night.  Patient agrees that she has been combining the alcohol with her Johnnye Sima as well as her pain medication.  She reports she is trying to numb her emotional pain by doing so.  Patient denies any suicidality, homicidality or perceptual disturbances.  Patient reports she has been noncompliant on the Geodon since she could not find the right time to take it.  She also does not want to be on any medication that can cause her weight gain.  Patient reports she really loves her job taking care of the client.  She enjoys going there 5 days a week.  Patient denies any other concerns today.  Visit Diagnosis:    ICD-10-CM   1. MDD (major depressive disorder), recurrent episode, moderate (HCC)  F33.1 topiramate (TOPAMAX) 50 MG tablet  2. Alcohol use disorder, mild, abuse  F10.10 topiramate (TOPAMAX) 50 MG tablet  3. Tobacco use disorder  F17.200     Past Psychiatric History: I have reviewed past psychiatric history from my progress note on 09/16/2018.  Past trials of Zoloft, Wellbutrin, Lexapro, Celexa.  Past Medical History:  Past Medical History:  Diagnosis Date   Anxiety    Breast cancer ([place])    Left breast(nipple mass) 2018   Cervical stenosis of spine    Chicken pox    Chronic back pain  Colon polyps    COPD (chronic obstructive pulmonary disease) (HCC)    Cyst of left nipple    Depression    Hypertension    Insomnia    Personal history of radiation therapy     Past Surgical History:  Procedure Laterality Date   BACK SURGERY     x 3 (2008/05/22 x 2; 05/23/11 x 1)    BREAST BIOPSY Left    core- neg   BREAST LUMPECTOMY Left 05/22/16   BREAST SURGERY Left    breast biopsy   CERVICAL CONE BIOPSY     CERVICAL FUSION     times 2   COLONOSCOPY WITH PROPOFOL N/A 04/18/2015   Procedure: COLONOSCOPY WITH PROPOFOL;  Surgeon: [name], MD;  Location: [facility];  Service: Endoscopy;  Laterality: N/A;   ELBOW SURGERY     05/22/2012 b/l elbows per pt ulnar nerve    ELBOW SURGERY     x2    EPIDURAL BLOCK INJECTION     Dr. Maryjean Ka    MASS EXCISION Left 04/12/2016   Procedure: EXCISION OF LEFT NIPPLE MASS;  Surgeon: Stark Klein, MD;  Location: [facility];  Service: General;  Laterality: Left;   NECK SURGERY     POSTERIOR CERVICAL FUSION/FORAMINOTOMY  03/07/2011   Procedure: POSTERIOR CERVICAL FUSION/FORAMINOTOMY LEVEL 4;  Surgeon: Eustace Moore, MD;  Location: [facility];  Service: Neurosurgery;  Laterality: N/A;  cervical three to thoracic one posterior cervical fusion    SENTINEL NODE BIOPSY Left 05/29/2016   Procedure: LEFT SENTINEL LYMPH NODE BIOPSY;  Surgeon: Stark Klein, MD;  Location: [facility];  Service: General;  Laterality: Left;   TONSILLECTOMY      Family Psychiatric History: I have reviewed family psychiatric history from my progress note on 09/16/2018.  Family History:  Family History  Problem Relation Age of Onset   Breast cancer Mother 25   Hypertension Mother    Cancer Mother        breast dx'ed 19    Cancer Father        prostate   Hypertension Father    Drug abuse Daughter    Hypertension Maternal Grandmother    Breast cancer Cousin     Social History: I have reviewed social history from my progress note on 09/16/2018. Social History   Socioeconomic History   Marital status: Divorced    Spouse name: Not on file   Number of children: 2   Years of education: Not on file   Highest education level: Not on file  Occupational History   Not on file  Tobacco Use   Smoking status: Current Every Day Smoker    Packs/day: 0.25    Years: 30.00    Pack years: 7.50    Types: Cigarettes   Smokeless tobacco: Never Used   Tobacco comment: work days - 8 cigarettes; weekends - ~12   Vaping Use   Vaping Use: Every day  Substance and Sexual Activity    Alcohol use: Yes    Comment: social   Drug use: No    Comment: on opana since jan 2018   Sexual activity: Not on file  Other Topics Concern   Not on file  Social History Narrative   As of 01/25/17 not sexually active of in relationship    Divorced   2 kids son and daughter (though daughter died of suicide in 22-May-2017)   Son lives in [place] now as of 12/2018  Disability    Loves animals has 3 dogs           Social Determinants of Radio broadcast assistant Strain: Medium Risk   Difficulty of Paying Living Expenses: Somewhat hard  Food Insecurity: Not on file  Transportation Needs: Not on file  Physical Activity: Not on file  Stress: Stress Concern Present   Feeling of Stress : To some extent  Social Connections: Not on file    Allergies:  Allergies  Allergen Reactions   Augmentin [Amoxicillin-Pot Clavulanate]     Diarrhea    Norvasc [Amlodipine]     Swelling    Sulfa Antibiotics Itching and Rash   Sulfonamide Derivatives Itching and Rash    Metabolic Disorder Labs: Lab Results  Component Value Date   HGBA1C 5.8 12/18/2019   No results found for: PROLACTIN Lab Results  Component Value Date   CHOL 203 (H) 08/25/2019   TRIG 77.0 08/25/2019   HDL 102.30 08/25/2019   CHOLHDL 2 08/25/2019   VLDL 15.4 08/25/2019   LDLCALC 85 08/25/2019   LDLCALC 74 02/27/2019   Lab Results  Component Value Date   TSH 0.71 08/25/2019   TSH 2.91 04/03/2019    Therapeutic Level Labs: No results found for: LITHIUM No results found for: VALPROATE No components found for:  CBMZ  Current Medications: Current Outpatient Medications  Medication Sig Dispense Refill   topiramate (TOPAMAX) 50 MG tablet Take 1 tablet (50 mg total) by mouth 2 (two) times daily. 60 tablet 1   albuterol (VENTOLIN HFA) 108 (90 Base) MCG/ACT inhaler Inhale 1-2 puffs into the lungs every 4 (four) hours as needed for wheezing or shortness of breath. 18 g 12   azelastine (ASTELIN) 0.1 %  nasal spray      buPROPion (WELLBUTRIN XL) 150 MG 24 hr tablet Take 1 tablet (150 mg total) by mouth daily. To be combined with 300 mg daily in the AM 90 tablet 0   buPROPion (WELLBUTRIN XL) 300 MG 24 hr tablet Take 1 tablet (300 mg total) by mouth daily. Further refills psych Dr. Shea Evans 90 tablet 1   DULoxetine (CYMBALTA) 30 MG capsule TAKE THREE CAPSULES DAILY 270 capsule 0   fluticasone (FLONASE) 50 MCG/ACT nasal spray TAKE 2 SPRAYS INTO BOTH NOSTRILS DAILY 16 g 11   furosemide (LASIX) 40 MG tablet Take 1 tablet (40 mg total) by mouth daily. In am D/c hctz/telmisartan combo pill 90 tablet 1   gabapentin (NEURONTIN) 600 MG tablet Take 1,200 mg by mouth 3 (three) times daily.     hydrOXYzine (VISTARIL) 25 MG capsule Take 1 capsule (25 mg total) by mouth every 8 (eight) hours as needed for anxiety. 90 capsule 1   ipratropium (ATROVENT) 0.06 % nasal spray Place 2 sprays into both nostrils 3 (three) times daily. prn 15 mL 12   letrozole (FEMARA) 2.5 MG tablet TAKE ONE TABLET BY MOUTH EVERY DAY 90 tablet 0   metoprolol succinate (TOPROL-XL) 25 MG 24 hr tablet Take 1 tablet (25 mg total) by mouth daily. 90 tablet 3   montelukast (SINGULAIR) 10 MG tablet Take 1 tablet (10 mg total) by mouth at bedtime. 90 tablet 3   Multiple Vitamins-Minerals (MULTIVITAMIN) tablet Take 1 tablet by mouth daily. 90 tablet 3   oxymorphone (OPANA) 10 MG tablet Take 10 mg by mouth 2 (two) times daily.     senna-docusate (SENOKOT-S) 8.6-50 MG tablet Take 1-2 tablets by mouth daily as needed for mild constipation. 180 tablet 1  telmisartan (MICARDIS) 80 MG tablet Take 1 tablet (80 mg total) by mouth daily. 90 tablet 3   Tiotropium Bromide-Olodaterol (STIOLTO RESPIMAT) 2.5-2.5 MCG/ACT AERS Inhale 2 puffs into the lungs daily. 4 g 11   varenicline (CHANTIX) 1 MG tablet Take 0.5 mg daily x 3 days, then 0.5 mg BID x 4 days, then 1 mg BID thereafter. TAKE WITH FOOD. (Patient not taking: Reported on 02/08/2020) 56  tablet 5   No current facility-administered medications for this visit.     Musculoskeletal: Strength & Muscle Tone: UTA Gait & Station: UTA Patient leans: N/A  Psychiatric Specialty Exam: Review of Systems  Musculoskeletal: Positive for back pain.  Psychiatric/Behavioral: Positive for dysphoric mood and sleep disturbance.  All other systems reviewed and are negative.   There were no vitals taken for this visit.There is no height or weight on file to calculate BMI.  General Appearance: Casual  Eye Contact:  Fair  Speech:  Clear and Coherent  Volume:  Normal  Mood:  Depressed  Affect:  Congruent  Thought Process:  Goal Directed and Descriptions of Associations: Intact  Orientation:  Full (Time, Place, and Person)  Thought Content: Logical   Suicidal Thoughts:  No  Homicidal Thoughts:  No  Memory:  Immediate;   Fair Recent;   Fair Remote;   Fair  Judgement:  Fair  Insight:  Fair  Psychomotor Activity:  Normal  Concentration:  Concentration: Fair and Attention Span: Fair  Recall:  [employer] of Knowledge: Fair  Language: Fair  Akathisia:  No  Handed:  Right  AIMS (if indicated): UTA  Assets:  Communication Skills Desire for Improvement Housing Talents/Skills  ADL's:  Intact  Cognition: WNL  Sleep:  Poor   Screenings: GAD-7   Flowsheet Row Office Visit from 06/12/2019 in [facility] Office Visit from 09/30/2017 in [facility]  Total GAD-7 Score 18 18    PHQ2-9   [place] Visit from 06/12/2019 in [facility] Visit from 12/31/2018 in [facility] from 09/16/2018 in [facility] Visit from 09/30/2017 in [facility] Office Visit from 08/19/2017 in [facility]  PHQ-2 Total Score 4 0 0 4 4  PHQ-9 Total Score 17 -- -- 18 16       Assessment and Plan: Caryssa Elzey is a 65 year old  Caucasian female, divorced, disabled, has a history of depression, chronic pain, hypertension, history of breast cancer was evaluated by telemedicine today.  Patient is biologically predisposed given her family history of substance abuse problems.  Patient with psychosocial stressors of health issues, financial problems, death of her daughter 2 years ago.  Patient is currently struggling with anxiety, grief reaction, sleep problems as well as alcoholism.  Patient will benefit from the following plan.  Plan MDD-unstable Cymbalta 90 mg p.o. daily Wellbutrin XL 450 mg p.o. daily Discontinue Geodon for noncompliance.  Restart Topamax 25 mg p.o. twice daily.  Patient has been noncompliant. Discontinue Lunesta for lack of benefit.  Patient also has been mixing Lunesta with alcohol, provided education. Hydroxyzine 25 mg p.o. daily as needed for severe anxiety. Patient will continue to benefit from psychotherapy sessions, encouraged compliance.  We will coordinate care with therapist.  Alcohol use disorder moderate-unstable Patient currently takes 2-3 drinks per day. Discussed starting AA meetings, provided resources. Continue CBT. Provided counseling.  Tobacco use disorder-unstable Provided counseling.  Provided education, discussed the risk of combining alcohol  with medications especially psychotropic medications including Lunesta as well as opioid medications.  Discussed coordinating care with her pain provider however patient declines.  Advised patient to be honest with her pain provider, she agrees.  She will have a discussion.  We will continue to monitor patient closely.  Follow-up in clinic in 2 weeks or sooner if needed.  I have spent atleast 20 minutes face to face by video with patient today. More than 50 % of the time was spent for preparing to see the patient ( e.g., review of test, records ) , ordering medications and test ,psychoeducation and supportive psychotherapy and care  coordination,as well as documenting clinical information in electronic health record. This note was generated in part or whole with voice recognition software. Voice recognition is usually quite accurate but there are transcription errors that can and very often do occur. I apologize for any typographical errors that were not detected and corrected.       Ursula Alert, MD 03/16/2020, 10:07 AM

## 2020-03-15 NOTE — Progress Notes (Signed)
No response to call or text or video invite  

## 2020-03-15 NOTE — Telephone Encounter (Signed)
Left message for patient to call back and schedule Medicare Annual Wellness Visit (AWV)  ° °This should be a telephone visit only=30 minutes. ° °No hx of AWV; please schedule at anytime with Denisa O'Brien-Blaney at [facility] ° ° °

## 2020-03-16 ENCOUNTER — Other Ambulatory Visit: Payer: Self-pay | Admitting: Psychiatry

## 2020-03-16 DIAGNOSIS — F331 Major depressive disorder, recurrent, moderate: Secondary | ICD-10-CM

## 2020-03-22 ENCOUNTER — Ambulatory Visit: Payer: Medicare Other

## 2020-03-24 ENCOUNTER — Telehealth: Payer: Medicare Other

## 2020-03-29 ENCOUNTER — Ambulatory Visit: Payer: Self-pay | Admitting: Licensed Clinical Social Worker

## 2020-03-30 ENCOUNTER — Telehealth: Payer: Medicare Other | Admitting: Psychiatry

## 2020-04-05 ENCOUNTER — Other Ambulatory Visit: Payer: Self-pay | Admitting: Internal Medicine

## 2020-04-05 ENCOUNTER — Other Ambulatory Visit: Payer: Self-pay | Admitting: Psychiatry

## 2020-04-05 DIAGNOSIS — M79622 Pain in left upper arm: Secondary | ICD-10-CM | POA: Diagnosis not present

## 2020-04-05 DIAGNOSIS — R2232 Localized swelling, mass and lump, left upper limb: Secondary | ICD-10-CM | POA: Diagnosis not present

## 2020-04-05 DIAGNOSIS — C50012 Malignant neoplasm of nipple and areola, left female breast: Secondary | ICD-10-CM | POA: Diagnosis not present

## 2020-04-05 DIAGNOSIS — I10 Essential (primary) hypertension: Secondary | ICD-10-CM

## 2020-04-08 ENCOUNTER — Other Ambulatory Visit: Payer: Self-pay | Admitting: General Surgery

## 2020-04-08 DIAGNOSIS — R2232 Localized swelling, mass and lump, left upper limb: Secondary | ICD-10-CM

## 2020-04-11 ENCOUNTER — Encounter: Payer: Self-pay | Admitting: Psychiatry

## 2020-04-11 ENCOUNTER — Telehealth (INDEPENDENT_AMBULATORY_CARE_PROVIDER_SITE_OTHER): Payer: PPO | Admitting: Psychiatry

## 2020-04-11 ENCOUNTER — Other Ambulatory Visit: Payer: Self-pay

## 2020-04-11 DIAGNOSIS — F331 Major depressive disorder, recurrent, moderate: Secondary | ICD-10-CM | POA: Diagnosis not present

## 2020-04-11 DIAGNOSIS — F1010 Alcohol abuse, uncomplicated: Secondary | ICD-10-CM

## 2020-04-11 DIAGNOSIS — F17200 Nicotine dependence, unspecified, uncomplicated: Secondary | ICD-10-CM

## 2020-04-11 MED ORDER — DULOXETINE HCL 60 MG PO CPEP: 60.0000 mg | ORAL_CAPSULE | Freq: Two times a day (BID) | ORAL | 1 refills | Status: DC

## 2020-04-11 NOTE — Progress Notes (Signed)
Virtual Visit via Video Note  I connected with Ashley Cross on 04/11/20 at  9:30 AM EST by a video enabled telemedicine application and verified that I am speaking with the correct person using two identifiers.  Location Provider Location : [facility] Patient Location : Home  Participants: Patient , Provider   I discussed the limitations of evaluation and management by telemedicine and the availability of in person appointments. The patient expressed understanding and agreed to proceed.  I discussed the assessment and treatment plan with the patient. The patient was provided an opportunity to ask questions and all were answered. The patient agreed with the plan and demonstrated an understanding of the instructions.   The patient was advised to call back or seek an in-person evaluation if the symptoms worsen or if the condition fails to improve as anticipated.   [facility] MD OP Progress Note  04/11/2020 8:46 PM Marylon Verno  MRN:  [id number]  Chief Complaint:  Chief Complaint    Follow-up; Depression; Alcohol Problem     HPI: Ashley Cross is a 65 year old Caucasian female on disability, lives in [place], has a history of MDD, alcohol use disorder, history of breast cancer, chronic pain, hypertension was evaluated by telemedicine today.  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  She reports she hence has not been going to work and has been staying home.  However she is planning to go back to work tomorrow.  Patient reports staying home was not easy for her since she felt depressed, mostly isolated because she does not have any other outlets at this time.  Patient does report having low energy, lack of motivation on and off, sadness and crying spells.  She reports sleep has improved since her last visit.  Patient denies any suicidality, homicidality or perceptual disturbances.  Patient reports she has been cutting  back on the alcohol and may have drank 1 glass of wine couple of times last week.  Her last drink was on Friday.  She is doing very good with that.  Patient reports she has upcoming appointment with therapist tomorrow and plans to keep it.  Patient is compliant on medications and denies side effects.  She currently has pain under her arm which has been going on since the past 1 month.  She has upcoming appointment with her provider for the same.  Visit Diagnosis:    ICD-10-CM   1. MDD (major depressive disorder), recurrent episode, moderate (HCC)  F33.1 DULoxetine (CYMBALTA) 60 MG capsule  2. Alcohol use disorder, mild, abuse  F10.10   3. Tobacco use disorder  F17.200     Past Psychiatric History: I have reviewed past psychiatric history from my progress note on 09/16/2018.  Past trials of Zoloft, Wellbutrin, Lexapro, Celexa  Past Medical History:  Past Medical History:  Diagnosis Date  . Anxiety   . Breast cancer ([place])    Left breast(nipple mass) 2018  . Cervical stenosis of spine   . Chicken pox   . Chronic back pain   . Colon polyps   . COPD (chronic obstructive pulmonary disease) ([place])   . Cyst of left nipple   . Depression   . Hypertension   . Insomnia   . Personal history of radiation therapy     Past Surgical History:  Procedure Laterality Date  . BACK SURGERY     x 3 (2010 x 2; 2013 x 1)   . BREAST BIOPSY  Left    core- neg  . BREAST LUMPECTOMY Left 05-11-16  . BREAST SURGERY Left    breast biopsy  . CERVICAL CONE BIOPSY    . CERVICAL FUSION     times 2  . COLONOSCOPY WITH PROPOFOL N/A 04/18/2015   Procedure: COLONOSCOPY WITH PROPOFOL;  Surgeon: [name], MD;  Location: [facility] ENDOSCOPY;  Service: Endoscopy;  Laterality: N/A;  . ELBOW SURGERY     2012/05/11 b/l elbows per pt ulnar nerve   . ELBOW SURGERY     x2   . EPIDURAL BLOCK INJECTION     Dr. Maryjean Ka   . MASS EXCISION Left 04/12/2016   Procedure: EXCISION OF LEFT NIPPLE MASS;  Surgeon: Stark Klein, MD;   Location: [facility];  Service: General;  Laterality: Left;  . NECK SURGERY    . POSTERIOR CERVICAL FUSION/FORAMINOTOMY  03/07/2011   Procedure: POSTERIOR CERVICAL FUSION/FORAMINOTOMY LEVEL 4;  Surgeon: Eustace Moore, MD;  Location: [facility]URO ORS;  Service: Neurosurgery;  Laterality: N/A;  cervical three to thoracic one posterior cervical fusion   . SENTINEL NODE BIOPSY Left 05/29/2016   Procedure: LEFT SENTINEL LYMPH NODE BIOPSY;  Surgeon: Stark Klein, MD;  Location: [facility];  Service: General;  Laterality: Left;  . TONSILLECTOMY      Family Psychiatric History: I have reviewed family psychiatric history from my progress note on 09/16/2018  Family History:  Family History  Problem Relation Age of Onset  . Breast cancer Mother 6  . Hypertension Mother   . Cancer Mother        breast dx'ed 19   . Cancer Father        prostate  . Hypertension Father   . Drug abuse Daughter   . Hypertension Maternal Grandmother   . Breast cancer Cousin     Social History: I have reviewed social history from my progress note on 09/16/2018 Social History   Socioeconomic History  . Marital status: Divorced    Spouse name: Not on file  . Number of children: 2  . Years of education: Not on file  . Highest education level: Not on file  Occupational History  . Not on file  Tobacco Use  . Smoking status: Current Every Day Smoker    Packs/day: 0.25    Years: 30.00    Pack years: 7.50    Types: Cigarettes  . Smokeless tobacco: Never Used  . Tobacco comment: work days - 8 cigarettes; weekends - ~12   Vaping Use  . Vaping Use: Every day  Substance and Sexual Activity  . Alcohol use: Yes    Comment: social  . Drug use: No    Comment: on opana since jan 2018  . Sexual activity: Not on file  Other Topics Concern  . Not on file  Social History Narrative   As of 01/25/17 not sexually active of in relationship    Divorced   2 kids son and daughter (though daughter died of suicide in  May 11, 2017)   Son lives in [place] now as of 12/2018       Disability    Loves animals has 3 dogs           Social Determinants of Radio broadcast assistant Strain: Medium Risk  . Difficulty of Paying Living Expenses: Somewhat hard  Food Insecurity: Not on file  Transportation Needs: Not on file  Physical Activity: Not on file  Stress: Stress Concern Present  . Feeling of Stress :  To some extent  Social Connections: Not on file    Allergies:  Allergies  Allergen Reactions  . Anastrozole   . Augmentin [Amoxicillin-Pot Clavulanate]     Diarrhea   . Norvasc [Amlodipine]     Swelling   . Sulfa Antibiotics Itching and Rash  . Sulfonamide Derivatives Itching and Rash    Metabolic Disorder Labs: Lab Results  Component Value Date   HGBA1C 5.8 12/18/2019   No results found for: PROLACTIN Lab Results  Component Value Date   CHOL 203 (H) 08/25/2019   TRIG 77.0 08/25/2019   HDL 102.30 08/25/2019   CHOLHDL 2 08/25/2019   VLDL 15.4 08/25/2019   LDLCALC 85 08/25/2019   LDLCALC 74 02/27/2019   Lab Results  Component Value Date   TSH 0.71 08/25/2019   TSH 2.91 04/03/2019    Therapeutic Level Labs: No results found for: LITHIUM No results found for: VALPROATE No components found for:  CBMZ  Current Medications: Current Outpatient Medications  Medication Sig Dispense Refill  . DULoxetine (CYMBALTA) 60 MG capsule Take 1 capsule (60 mg total) by mouth 2 (two) times daily. 60 capsule 1  . albuterol (VENTOLIN HFA) 108 (90 Base) MCG/ACT inhaler Inhale 1-2 puffs into the lungs every 4 (four) hours as needed for wheezing or shortness of breath. 18 g 12  . azelastine (ASTELIN) 0.1 % nasal spray     . buPROPion (WELLBUTRIN XL) 150 MG 24 hr tablet TAKE 1 TABLET BY MOUTH DAILY, TO BE COMBINED WITH 300MG  EVERY MORNING AS DIRECTED 90 tablet 0  . buPROPion (WELLBUTRIN XL) 300 MG 24 hr tablet Take 1 tablet (300 mg total) by mouth daily. Further refills psych Dr. Shea Evans 90 tablet 1   . cyclobenzaprine (FLEXERIL) 10 MG tablet Take 10 mg by mouth every 8 (eight) hours as needed.    . fluticasone (FLONASE) 50 MCG/ACT nasal spray TAKE 2 SPRAYS INTO BOTH NOSTRILS DAILY 16 g 11  . furosemide (LASIX) 40 MG tablet TAKE ONE TABLET (40 MG) BY MOUTH EVERY MORNING - D/C HCTZ+TELMISARTAN COMBO PILL 90 tablet 1  . gabapentin (NEURONTIN) 600 MG tablet Take 1,200 mg by mouth 3 (three) times daily.    . hydrOXYzine (VISTARIL) 25 MG capsule Take 1 capsule (25 mg total) by mouth every 8 (eight) hours as needed for anxiety. 90 capsule 1  . ipratropium (ATROVENT) 0.06 % nasal spray Place 2 sprays into both nostrils 3 (three) times daily. prn 15 mL 12  . letrozole (FEMARA) 2.5 MG tablet TAKE ONE TABLET BY MOUTH EVERY DAY 90 tablet 0  . metoprolol succinate (TOPROL-XL) 25 MG 24 hr tablet Take 1 tablet (25 mg total) by mouth daily. 90 tablet 3  . montelukast (SINGULAIR) 10 MG tablet Take 1 tablet (10 mg total) by mouth at bedtime. 90 tablet 3  . Multiple Vitamins-Minerals (MULTIVITAMIN) tablet Take 1 tablet by mouth daily. 90 tablet 3  . oxymorphone (OPANA) 10 MG tablet Take 10 mg by mouth 2 (two) times daily.    Marland Kitchen senna-docusate (SENOKOT-S) 8.6-50 MG tablet Take 1-2 tablets by mouth daily as needed for mild constipation. 180 tablet 1  . telmisartan (MICARDIS) 80 MG tablet Take 1 tablet (80 mg total) by mouth daily. 90 tablet 3  . Tiotropium Bromide-Olodaterol (STIOLTO RESPIMAT) 2.5-2.5 MCG/ACT AERS Inhale 2 puffs into the lungs daily. 4 g 11  . topiramate (TOPAMAX) 50 MG tablet Take 1 tablet (50 mg total) by mouth 2 (two) times daily. 60 tablet 1  . varenicline (CHANTIX) 1  MG tablet Take 0.5 mg daily x 3 days, then 0.5 mg BID x 4 days, then 1 mg BID thereafter. TAKE WITH FOOD. (Patient not taking: Reported on 02/08/2020) 56 tablet 5   No current facility-administered medications for this visit.     Musculoskeletal: Strength & Muscle Tone: UTA Gait & Station: UTA Patient leans:  N/A  Psychiatric Specialty Exam: Review of Systems  Psychiatric/Behavioral: Positive for dysphoric mood. The patient is nervous/anxious.   All other systems reviewed and are negative.   There were no vitals taken for this visit.There is no height or weight on file to calculate BMI.  General Appearance: Casual  Eye Contact:  Fair  Speech:  Clear and Coherent  Volume:  Normal  Mood:  Anxious and Depressed  Affect:  Congruent  Thought Process:  Goal Directed and Descriptions of Associations: Intact  Orientation:  Full (Time, Place, and Person)  Thought Content: Logical   Suicidal Thoughts:  No  Homicidal Thoughts:  No  Memory:  Immediate;   Fair Recent;   Fair Remote;   Fair  Judgement:  Fair  Insight:  Fair  Psychomotor Activity:  Normal  Concentration:  Concentration: Fair and Attention Span: Fair  Recall:  [employer] of Knowledge: Fair  Language: Fair  Akathisia:  No  Handed:  Right  AIMS (if indicated): UTA  Assets:  Communication Skills Desire for Improvement Housing Social Support  ADL's:  Intact  Cognition: WNL  Sleep:  Improving   Screenings: GAD-7   Flowsheet Row Office Visit from 06/12/2019 in [facility] Visit from 09/30/2017 in [facility]  Total GAD-7 Score 18 18    PHQ2-9   [place] Video Visit from 04/11/2020 in [facility] Visit from 06/12/2019 in [facility] Office Visit from 12/31/2018 in [facility] from 09/16/2018 in [facility] Visit from 09/30/2017 in [facility]  PHQ-2 Total Score 2 4 0 0 4  PHQ-9 Total Score 13 17 - - 18    Flowsheet Row Video Visit from 04/11/2020 in [facility] No Risk       Assessment and Plan: Gisselle Galvis is a 65 year old Caucasian female, divorced, disabled, has a history of  depression, chronic pain, hypertension, history of breast cancer was evaluated by telemedicine today.  Patient is biologically predisposed given her family history of substance abuse problems.  Patient with psychosocial stressors of health issues, financial problems,death of her daughter 2-1/2 years ago.  Patient is currently struggling with depression and anxiety, although making progress.  Plan as noted below.  Plan MDD-improving Increase Cymbalta to 60 mg p.o. twice daily Wellbutrin XL 450 mg p.o. daily Topamax 50 mg p.o. twice daily Hydroxyzine 25 mg p.o. daily as needed for anxiety attacks  Alcohol use disorder moderate-improving Patient reports she is currently cutting back and has 1 drink per day couple of times a week. Topamax 50 mg po bid. We will monitor closely Continue CBT  Tobacco use disorder-unstable Provided counseling  Follow-up in clinic in 3 to 4 weeks or sooner if needed.  This note was generated in part or whole with voice recognition software. Voice recognition is usually quite accurate but there are transcription errors that can and very often do occur. I apologize for any typographical errors that were not detected and corrected.      Ursula Alert, MD 04/11/2020, 8:46 PM

## 2020-04-12 ENCOUNTER — Ambulatory Visit (INDEPENDENT_AMBULATORY_CARE_PROVIDER_SITE_OTHER): Payer: PPO | Admitting: Licensed Clinical Social Worker

## 2020-04-12 DIAGNOSIS — F331 Major depressive disorder, recurrent, moderate: Secondary | ICD-10-CM

## 2020-04-12 NOTE — Progress Notes (Signed)
Virtual Visit via Video Note  I connected with Ashley Cross on 04/12/20 at  9:00 AM EST by a video enabled telemedicine application and verified that I am speaking with the correct person using two identifiers.  Location: Patient: home Provider: [facility]   I discussed the limitations of evaluation and management by telemedicine and the availability of in person appointments. The patient expressed understanding and agreed to proceed.  I discussed the assessment and treatment plan with the patient. The patient was provided an opportunity to ask questions and all were answered. The patient agreed with the plan and demonstrated an understanding of the instructions.   The patient was advised to call back or seek an in-person evaluation if the symptoms worsen or if the condition fails to improve as anticipated.  I provided 60 minutes of non-face-to-face time during this encounter.   [place], LCSW    THERAPIST PROGRESS NOTE  Session Time: 9-10a  Participation Level: Active  Behavioral Response: NeatAlertAnxious and Depressed  Type of Therapy: Individual Therapy  Treatment Goals addressed: Anxiety and Coping  Interventions: CBT, Strength-based and Supportive  Summary: Ashley Cross is a 65 y.o. female who presents with improving symptoms related to depression. Pt reports that "yesterday I felt like a different person than I do today".  Pt reports that recently her symptoms of depression had escalated due to isolation due to Covid exposure. 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 Pt feels very excited today that her quarantine is over and that she is able to engage with others again "I have not felt that depressed in a very long time".    Allowed pt to explore and express her thoughts and feelings about current life events. Pt feels that she is ready to enter the world of dating again: "I don't even know where to begin".  Pt states that  she feels that she may like to meet someone out and about, but hesitates to meet someone using a dating app or online dating website. Suggested pt reach out to others that have utilized those resources and discuss pros/cons.   Pt reports that she is trying to focus on self care and getting projects done around the house. Pt just put in a larger birdcage and states that the birds are very happy with the change. Pt is starting to focus on personal goals and ways to improve social engagement.   Continued recommendations are as follows: self care behaviors, positive social engagements, focusing on overall work/home/life balance, and focusing on positive physical and emotional wellness.     Suicidal/Homicidal: No  Therapist Response: Ashley Cross is continuing to develop coping skills to help with anxiety and mood stability. Ashley Cross has noted that when she is more isolated her depression symptoms increase, so pt is using that information to develop interventions to prevent future depressive episodes using social engagement. Ashley Cross is able to identify and assess current and past mood episodes including their features, frequency, intensity, and duration. Pt expressed to psychiatrist yesterday that she wants to cut back on drinking and is making those changes.These behaviors are reflective of personal growth and progress. Treatment to continue.  Plan: Return again in 3 weeks.  Diagnosis: Axis I: Major Depression, Recurrent severe    Axis II: No diagnosis    Rachel Bo Adiba Fargnoli, LCSW 04/12/2020

## 2020-04-19 ENCOUNTER — Ambulatory Visit (INDEPENDENT_AMBULATORY_CARE_PROVIDER_SITE_OTHER): Payer: Medicare Other | Admitting: Pharmacist

## 2020-04-19 DIAGNOSIS — I10 Essential (primary) hypertension: Secondary | ICD-10-CM

## 2020-04-19 DIAGNOSIS — Z720 Tobacco use: Secondary | ICD-10-CM

## 2020-04-19 DIAGNOSIS — F32A Depression, unspecified: Secondary | ICD-10-CM

## 2020-04-19 DIAGNOSIS — F419 Anxiety disorder, unspecified: Secondary | ICD-10-CM

## 2020-04-19 DIAGNOSIS — J449 Chronic obstructive pulmonary disease, unspecified: Secondary | ICD-10-CM

## 2020-04-19 NOTE — Patient Instructions (Signed)
Visit Information  PATIENT GOALS: Goals Addressed              This Visit's Progress     Patient Stated   .  Medication Monitoring (pt-stated)        Patient Goals/Self-Care Activities . Over the next 90 days, patient will:  - take medications as prescribed check blood pressure periodically, document, and provide at future appointments collaborate with provider on medication access solutions Focus on reduction in tobacco cessation       Patient verbalizes understanding of instructions provided today and agrees to view in [place].   Plan: Telephone follow up appointment with care management team member scheduled for:  ~ 8 weeks  [name], PharmD, [place], [place] Clinical Pharmacist [employer] [facility] [employer] [phone]
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
## 2020-04-20 ENCOUNTER — Ambulatory Visit (INDEPENDENT_AMBULATORY_CARE_PROVIDER_SITE_OTHER): Payer: PPO

## 2020-04-20 VITALS — Ht 64.17 in | Wt 131.0 lb

## 2020-04-20 DIAGNOSIS — Z780 Asymptomatic menopausal state: Secondary | ICD-10-CM

## 2020-04-20 DIAGNOSIS — Z0000 Encounter for general adult medical examination without abnormal findings: Secondary | ICD-10-CM | POA: Diagnosis not present

## 2020-04-20 NOTE — Progress Notes (Signed)
Subjective:   Cherril Hett is a 65 y.o. female who presents for an Initial Medicare Annual Wellness Visit.  Review of Systems    No ROS.  Medicare Wellness Virtual Visit.    Cardiac Risk Factors include: advanced age (>31men, >5 women);hypertension     Objective:    Today's Vitals   04/20/20 0905  Weight: 131 lb (59.4 kg)  Height: 5' 4.17" (1.63 m)   Body mass index is 22.37 kg/m.  Advanced Directives 04/20/2020 06/24/2017 09/07/2016 08/13/2016 06/13/2016 06/04/2016 05/16/2016  Does Patient Have a Medical Advance Directive? No No Yes No No No No  Would patient like information on creating a medical advance directive? No - Patient declined Yes (MAU/Ambulatory/Procedural Areas - Information given) - Yes (Inpatient - patient requests chaplain consult to create a medical advance directive);Yes (MAU/Ambulatory/Procedural Areas - Information given) - - Yes (MAU/Ambulatory/Procedural Areas - Information given)  Pre-existing out of facility DNR order (yellow form or pink MOST form) - - - - - - -    Current Medications (verified) Outpatient Encounter Medications as of 04/20/2020  Medication Sig  . albuterol (VENTOLIN HFA) 108 (90 Base) MCG/ACT inhaler Inhale 1-2 puffs into the lungs every 4 (four) hours as needed for wheezing or shortness of breath.  Marland Kitchen azelastine (ASTELIN) 0.1 % nasal spray  (Patient not taking: Reported on 04/19/2020)  . buPROPion (WELLBUTRIN XL) 150 MG 24 hr tablet TAKE 1 TABLET BY MOUTH DAILY, TO BE COMBINED WITH 300MG  EVERY MORNING AS DIRECTED  . buPROPion (WELLBUTRIN XL) 300 MG 24 hr tablet Take 1 tablet (300 mg total) by mouth daily. Further refills psych Dr. Shea Evans  . cyclobenzaprine (FLEXERIL) 10 MG tablet Take 10 mg by mouth every 8 (eight) hours as needed.  . DULoxetine (CYMBALTA) 60 MG capsule Take 1 capsule (60 mg total) by mouth 2 (two) times daily.  . fluticasone (FLONASE) 50 MCG/ACT nasal spray TAKE 2 SPRAYS INTO BOTH NOSTRILS DAILY  . furosemide (LASIX) 40  MG tablet TAKE ONE TABLET (40 MG) BY MOUTH EVERY MORNING - D/C HCTZ+TELMISARTAN COMBO PILL  . gabapentin (NEURONTIN) 600 MG tablet Take 1,200 mg by mouth 3 (three) times daily.  . hydrOXYzine (VISTARIL) 25 MG capsule Take 1 capsule (25 mg total) by mouth every 8 (eight) hours as needed for anxiety.  Marland Kitchen ipratropium (ATROVENT) 0.06 % nasal spray Place 2 sprays into both nostrils 3 (three) times daily. prn (Patient not taking: Reported on 04/19/2020)  . letrozole (FEMARA) 2.5 MG tablet TAKE ONE TABLET BY MOUTH EVERY DAY  . metoprolol succinate (TOPROL-XL) 25 MG 24 hr tablet Take 1 tablet (25 mg total) by mouth daily.  . montelukast (SINGULAIR) 10 MG tablet Take 1 tablet (10 mg total) by mouth at bedtime.  . Multiple Vitamins-Minerals (MULTIVITAMIN) tablet Take 1 tablet by mouth daily.  Marland Kitchen oxymorphone (OPANA) 10 MG tablet Take 10 mg by mouth 2 (two) times daily.  Marland Kitchen senna-docusate (SENOKOT-S) 8.6-50 MG tablet Take 1-2 tablets by mouth daily as needed for mild constipation.  Marland Kitchen telmisartan (MICARDIS) 80 MG tablet Take 1 tablet (80 mg total) by mouth daily.  . Tiotropium Bromide-Olodaterol (STIOLTO RESPIMAT) 2.5-2.5 MCG/ACT AERS Inhale 2 puffs into the lungs daily.  Marland Kitchen topiramate (TOPAMAX) 50 MG tablet Take 1 tablet (50 mg total) by mouth 2 (two) times daily.   No facility-administered encounter medications on file as of 04/20/2020.    Allergies (verified) Anastrozole, Augmentin [amoxicillin-pot clavulanate], Norvasc [amlodipine], Sulfa antibiotics, and Sulfonamide derivatives   History: Past Medical History:  Diagnosis Date  . Anxiety   . Breast cancer ([place])    Left breast(nipple mass) 05-18-16  . Cervical stenosis of spine   . Chicken pox   . Chronic back pain   . Colon polyps   . COPD (chronic obstructive pulmonary disease) ([place])   . Cyst of left nipple   . Depression   . Hypertension   . Insomnia   . Personal history of radiation therapy    Past Surgical History:  Procedure Laterality Date   . BACK SURGERY     x 3 (May 18, 2008 x 2; 19-May-2011 x 1)   . BREAST BIOPSY Left    core- neg  . BREAST LUMPECTOMY Left 05/18/2016  . BREAST SURGERY Left    breast biopsy  . CERVICAL CONE BIOPSY    . CERVICAL FUSION     times 2  . COLONOSCOPY WITH PROPOFOL N/A 04/18/2015   Procedure: COLONOSCOPY WITH PROPOFOL;  Surgeon: [name], MD;  Location: [facility] ENDOSCOPY;  Service: Endoscopy;  Laterality: N/A;  . ELBOW SURGERY     May 18, 2012 b/l elbows per pt ulnar nerve   . ELBOW SURGERY     x2   . EPIDURAL BLOCK INJECTION     Dr. Maryjean Ka   . MASS EXCISION Left 04/12/2016   Procedure: EXCISION OF LEFT NIPPLE MASS;  Surgeon: Stark Klein, MD;  Location: [facility];  Service: General;  Laterality: Left;  . NECK SURGERY    . POSTERIOR CERVICAL FUSION/FORAMINOTOMY  03/07/2011   Procedure: POSTERIOR CERVICAL FUSION/FORAMINOTOMY LEVEL 4;  Surgeon: Eustace Moore, MD;  Location: [place] NEURO ORS;  Service: Neurosurgery;  Laterality: N/A;  cervical three to thoracic one posterior cervical fusion   . SENTINEL NODE BIOPSY Left 05/29/2016   Procedure: LEFT SENTINEL LYMPH NODE BIOPSY;  Surgeon: Stark Klein, MD;  Location: [facility];  Service: General;  Laterality: Left;  . TONSILLECTOMY     Family History  Problem Relation Age of Onset  . Breast cancer Mother 23  . Hypertension Mother   . Cancer Mother        breast dx'ed 10   . Cancer Father        prostate  . Hypertension Father   . Drug abuse Daughter   . Hypertension Maternal Grandmother   . Breast cancer Cousin    Social History   Socioeconomic History  . Marital status: Divorced    Spouse name: Not on file  . Number of children: 2  . Years of education: Not on file  . Highest education level: Not on file  Occupational History  . Not on file  Tobacco Use  . Smoking status: Current Every Day Smoker    Packs/day: 0.25    Years: 30.00    Pack years: 7.50    Types: Cigarettes  . Smokeless tobacco: Never Used  . Tobacco comment: work days - 6  cigarettes; weekends - ~10   Vaping Use  . Vaping Use: Every day  Substance and Sexual Activity  . Alcohol use: Yes    Comment: social  . Drug use: No    Comment: on opana since jan 2018  . Sexual activity: Not on file  Other Topics Concern  . Not on file  Social History Narrative   As of 01/25/17 not sexually active of in relationship    Divorced   2 kids son and daughter (though daughter died of suicide in 2017-05-18)   Son lives in [place] now as of 12/2018  Disability    Loves animals has 3 dogs           Social Determinants of Health   Financial Resource Strain: Low Risk   . Difficulty of Paying Living Expenses: Not hard at all  Food Insecurity: No Food Insecurity  . Worried About Charity fundraiser in the Last Year: Never true  . Ran Out of Food in the Last Year: Never true  Transportation Needs: No Transportation Needs  . Lack of Transportation (Medical): No  . Lack of Transportation (Non-Medical): No  Physical Activity: Not on file  Stress: Stress Concern Present  . Feeling of Stress : Rather much  Social Connections: Unknown  . Frequency of Communication with Friends and Family: More than three times a week  . Frequency of Social Gatherings with Friends and Family: Not on file  . Attends Religious Services: Not on file  . Active Member of Clubs or Organizations: Not on file  . Attends Archivist Meetings: Not on file  . Marital Status: Not on file    Tobacco Counseling Ready to quit: Not Answered Counseling given: Not Answered Comment: work days - 6 cigarettes; weekends - ~10    Clinical Intake:  Pre-visit preparation completed: Yes        Diabetes: No  How often do you need to have someone help you when you read instructions, pamphlets, or other written materials from your doctor or pharmacy?: 1 - Never   Interpreter Needed?: No      Activities of Daily Living In your present state of health, do you have any difficulty  performing the following activities: 04/20/2020  Hearing? N  Vision? N  Difficulty concentrating or making decisions? N  Walking or climbing stairs? N  Dressing or bathing? N  Doing errands, shopping? N  Preparing Food and eating ? N  Using the Toilet? N  In the past six months, have you accidently leaked urine? N  Do you have problems with loss of bowel control? N  Managing your Medications? N  Managing your Finances? N  Housekeeping or managing your Housekeeping? N  Some recent data might be hidden    Patient Care Team: McLean-Scocuzza, Nino Glow, MD as PCP - General (Internal Medicine) Perrin Maltese, MD (Internal Medicine) Delice Bison Charlestine Massed, NP as Nurse Practitioner (Hematology and Oncology) Nicholas Lose, MD as Consulting Physician (Hematology and Oncology) Stark Klein, MD as Consulting Physician (General Surgery) Kyung Rudd, MD as Consulting Physician ([place]) De Hollingshead, Daniels as Pharmacist (Pharmacist)  Indicate any recent Medical Services you may have received from other than [facility] providers in the past year (date may be approximate).     Assessment:   This is a routine wellness examination for Sydni.  I connected with Chabely today by telephone and verified that I am speaking with the correct person using two identifiers. Location patient: home Location provider: work Persons participating in the virtual visit: patient, Marine scientist.    I discussed the limitations, risks, security and privacy concerns of performing an evaluation and management service by telephone and the availability of in person appointments. The patient expressed understanding and verbally consented to this telephonic visit.    Interactive audio and video telecommunications were attempted between this provider and patient, however failed, due to patient having technical difficulties OR patient did not have access to video capability.  We continued and completed visit with  audio only.  Some vital signs may be absent or patient reported.  Hearing/Vision screen  Hearing Screening   125Hz  250Hz  500Hz  1000Hz  2000Hz  3000Hz  4000Hz  6000Hz  8000Hz   Right ear:           Left ear:           Comments: Patient is able to hear conversational tones without difficulty.  No issues reported.  Vision Screening Comments: Visual acuity not assessed, virtual visit.  Plans to schedule after verifying in network care.      Dietary issues and exercise activities discussed: Current Exercise Habits: Home exercise routine, Intensity: Mild  Regular diet Good water intake  Goals      Patient Stated   .  Medication Monitoring (pt-stated)      Patient Goals/Self-Care Activities . Over the next 90 days, patient will:  - take medications as prescribed check blood pressure periodically, document, and provide at future appointments collaborate with provider on medication access solutions Focus on reduction in tobacco cessation      Depression Screen PHQ 2/9 Scores 04/20/2020 06/12/2019 12/31/2018 09/30/2017 08/19/2017 06/13/2016  PHQ - 2 Score - 4 0 4 4 1   PHQ- 9 Score - 17 - 18 16 -  Exception Documentation Other- indicate reason in comment box - - - - -  Not completed Counsel every 3 weeks with Dr. Iona Coach - - - - -  Some encounter information is confidential and restricted. Go to Review Flowsheets activity to see all data.    Fall Risk Fall Risk  04/20/2020 09/23/2019 06/12/2019 12/31/2018 09/30/2017  Falls in the past year? 0 0 1 0 Yes  Number falls in past yr: 0 0 1 0 2 or more  Injury with Fall? 0 0 0 - Yes  Risk Factor Category  - - - - High Fall Risk  Risk for fall due to : - - History of fall(s) - Impaired balance/gait;History of fall(s)  Follow up Falls evaluation completed Falls evaluation completed Falls evaluation completed Falls evaluation completed -    FALL RISK PREVENTION PERTAINING TO THE HOME: Handrails in use when climbing stairs? Yes Home free of loose throw  rugs in walkways, pet beds, electrical cords, etc? Yes  Adequate lighting in your home to reduce risk of falls? Yes   ASSISTIVE DEVICES UTILIZED TO PREVENT FALLS: Use of a cane, walker or w/c? No   TIMED UP AND GO: Was the test performed? No . Virtual visit.   Cognitive Function:     6CIT Screen 04/20/2020  What Year? 0 points  What month? 0 points  What time? 0 points  Count back from 20 0 points  Months in reverse 0 points  Repeat phrase 0 points  Total Score 0    Immunizations Immunization History  Administered Date(s) Administered  . Influenza,inj,Quad PF,6+ Mos 01/25/2017, 11/26/2017, 12/17/2018  . Moderna Sars-Covid-2 Vaccination 01/08/2020  . PFIZER(Purple Top)SARS-COV-2 Vaccination 05/21/2019, 06/11/2019  . Pneumococcal Polysaccharide-23 03/26/2018  . Td 11/15/2003  . Tdap 01/30/2016    Health Maintenance Health Maintenance  Topic Date Due  . INFLUENZA VACCINE  06/06/2020 (Originally 09/06/2019)  . COVID-19 Vaccine (4 - Booster) 07/08/2020  . MAMMOGRAM  06/07/2021  . PAP SMEAR-Modifier  06/12/2022  . COLONOSCOPY (Pts 45-66yrs Insurance coverage will need to be confirmed)  04/17/2025  . TETANUS/TDAP  01/29/2026  . Hepatitis C Screening  Completed  . HIV Screening  Completed  . HPV VACCINES  Aged Out   Colorectal cancer screening: Type of screening: Colonoscopy. Completed 04/18/15. Repeat every 10 years  Mammogram status: Completed 06/08/19. Repeat every year  Bone density- ordered per patient consent.   Dental Screening: Recommended annual dental exams for proper oral hygiene. Plans to schedule after verifying insurance coverage.   Community Resource Referral / Chronic Care Management: CRR required this visit?  No   CCM required this visit?  No      Plan:   Keep all routine maintenance appointments.   Follow up 04/26/20 @ 8:00  Follow up CCM 06/22/20 @ 9:00  Bone density ordered. Facility to call and schedule.  I have personally reviewed and noted  the following in the patient's chart:   . Medical and social history . Use of alcohol, tobacco or illicit drugs  . Current medications and supplements . Functional ability and status . Nutritional status . Physical activity . Advanced directives . List of other physicians . Hospitalizations, surgeries, and ER visits in previous 12 months . Vitals . Screenings to include cognitive, depression, and falls . Referrals and appointments  In addition, I have reviewed and discussed with patient certain preventive protocols, quality metrics, and best practice recommendations. A written personalized care plan for preventive services as well as general preventive health recommendations were provided to patient via mychart.     Varney Biles, LPN   [id number]

## 2020-04-20 NOTE — Patient Instructions (Addendum)
Ashley Cross , Thank you for taking time to come for your Medicare Wellness Visit. I appreciate your ongoing commitment to your health goals. Please review the following plan we discussed and let me know if I can assist you in the future.   These are the goals we discussed: Goals      Patient Stated   .  Medication Monitoring (pt-stated)      Patient Goals/Self-Care Activities . Over the next 90 days, patient will:  - take medications as prescribed check blood pressure periodically, document, and provide at future appointments collaborate with provider on medication access solutions Focus on reduction in tobacco cessation       This is a list of the screening recommended for you and due dates:  Health Maintenance  Topic Date Due  . Flu Shot  06/06/2020*  . COVID-19 Vaccine (4 - Booster) 07/08/2020  . Mammogram  06/07/2021  . Pap Smear  06/12/2022  . Colon Cancer Screening  04/17/2025  . Tetanus Vaccine  01/29/2026  .  Hepatitis C: One time screening is recommended by Center for Disease Control  (CDC) for  adults born from [birth year] through 1964.   Completed  . HIV Screening  Completed  . HPV Vaccine  Aged Out  *Topic was postponed. The date shown is not the original due date.   Keep all routine maintenance appointments.   Follow up 04/26/20 @ 8:00  Follow up CCM 06/22/20 @ 9:00  Bone density ordered. Facility to call and schedule.  Advanced directives: not yet completed.  Conditions/risks identified: none new.   Follow up in one year for your annual wellness visit.   Preventive Care 40-64 Years, Female Preventive care refers to lifestyle choices and visits with your health care provider that can promote health and wellness. What does preventive care include?  A yearly physical exam. This is also called an annual well check.  Dental exams once or twice a year.  Routine eye exams. Ask your health care provider how often you should have your eyes checked.  Personal  lifestyle choices, including:  Daily care of your teeth and gums.  Regular physical activity.  Eating a healthy diet.  Avoiding tobacco and drug use.  Limiting alcohol use.  Practicing safe sex.  Taking low-dose aspirin daily starting at age 67.  Taking vitamin and mineral supplements as recommended by your health care provider. What happens during an annual well check? The services and screenings done by your health care provider during your annual well check will depend on your age, overall health, lifestyle risk factors, and family history of disease. Counseling  Your health care provider may ask you questions about your:  Alcohol use.  Tobacco use.  Drug use.  Emotional well-being.  Home and relationship well-being.  Sexual activity.  Eating habits.  Work and work Statistician.  Method of birth control.  Menstrual cycle.  Pregnancy history. Screening  You may have the following tests or measurements:  Height, weight, and BMI.  Blood pressure.  Lipid and cholesterol levels. These may be checked every 5 years, or more frequently if you are over 67 years old.  Skin check.  Lung cancer screening. You may have this screening every year starting at age 38 if you have a 30-pack-year history of smoking and currently smoke or have quit within the past 15 years.  Fecal occult blood test (FOBT) of the stool. You may have this test every year starting at age 42.  Flexible sigmoidoscopy or colonoscopy.  You may have a sigmoidoscopy every 5 years or a colonoscopy every 10 years starting at age 59.  Hepatitis C blood test.  Hepatitis B blood test.  Sexually transmitted disease (STD) testing.  Diabetes screening. This is done by checking your blood sugar (glucose) after you have not eaten for a while (fasting). You may have this done every 1-3 years.  Mammogram. This may be done every 1-2 years. Talk to your health care provider about when you should start having  regular mammograms. This may depend on whether you have a family history of breast cancer.  BRCA-related cancer screening. This may be done if you have a family history of breast, ovarian, tubal, or peritoneal cancers.  Pelvic exam and Pap test. This may be done every 3 years starting at age 7. Starting at age 67, this may be done every 5 years if you have a Pap test in combination with an HPV test.  Bone density scan. This is done to screen for osteoporosis. You may have this scan if you are at high risk for osteoporosis. Discuss your test results, treatment options, and if necessary, the need for more tests with your health care provider. Vaccines  Your health care provider may recommend certain vaccines, such as:  Influenza vaccine. This is recommended every year.  Tetanus, diphtheria, and acellular pertussis (Tdap, Td) vaccine. You may need a Td booster every 10 years.  Zoster vaccine. You may need this after age 30.  Pneumococcal 13-valent conjugate (PCV13) vaccine. You may need this if you have certain conditions and were not previously vaccinated.  Pneumococcal polysaccharide (PPSV23) vaccine. You may need one or two doses if you smoke cigarettes or if you have certain conditions. Talk to your health care provider about which screenings and vaccines you need and how often you need them. This information is not intended to replace advice given to you by your health care provider. Make sure you discuss any questions you have with your health care provider. Document Released: 02/18/2015 Document Revised: 10/12/2015 Document Reviewed: 11/23/2014 Elsevier Interactive Patient Education  2017 [place] Prevention in the Home Falls can cause injuries. They can happen to people of all ages. There are many things you can do to make your home safe and to help prevent falls. What can I do on the outside of my home?  Regularly fix the edges of walkways and driveways and fix any  cracks.  Remove anything that might make you trip as you walk through a door, such as a raised step or threshold.  Trim any bushes or trees on the path to your home.  Use bright outdoor lighting.  Clear any walking paths of anything that might make someone trip, such as rocks or tools.  Regularly check to see if handrails are loose or broken. Make sure that both sides of any steps have handrails.  Any raised decks and porches should have guardrails on the edges.  Have any leaves, snow, or ice cleared regularly.  Use sand or salt on walking paths during winter.  Clean up any spills in your garage right away. This includes oil or grease spills. What can I do in the bathroom?  Use night lights.  Install grab bars by the toilet and in the tub and shower. Do not use towel bars as grab bars.  Use non-skid mats or decals in the tub or shower.  If you need to sit down in the shower, use a plastic, non-slip  stool.  Keep the floor dry. Clean up any water that spills on the floor as soon as it happens.  Remove soap buildup in the tub or shower regularly.  Attach bath mats securely with double-sided non-slip rug tape.  Do not have throw rugs and other things on the floor that can make you trip. What can I do in the bedroom?  Use night lights.  Make sure that you have a light by your bed that is easy to reach.  Do not use any sheets or blankets that are too big for your bed. They should not hang down onto the floor.  Have a firm chair that has side arms. You can use this for support while you get dressed.  Do not have throw rugs and other things on the floor that can make you trip. What can I do in the kitchen?  Clean up any spills right away.  Avoid walking on wet floors.  Keep items that you use a lot in easy-to-reach places.  If you need to reach something above you, use a strong step stool that has a grab bar.  Keep electrical cords out of the way.  Do not use floor  polish or wax that makes floors slippery. If you must use wax, use non-skid floor wax.  Do not have throw rugs and other things on the floor that can make you trip. What can I do with my stairs?  Do not leave any items on the stairs.  Make sure that there are handrails on both sides of the stairs and use them. Fix handrails that are broken or loose. Make sure that handrails are as long as the stairways.  Check any carpeting to make sure that it is firmly attached to the stairs. Fix any carpet that is loose or worn.  Avoid having throw rugs at the top or bottom of the stairs. If you do have throw rugs, attach them to the floor with carpet tape.  Make sure that you have a light switch at the top of the stairs and the bottom of the stairs. If you do not have them, ask someone to add them for you. What else can I do to help prevent falls?  Wear shoes that:  Do not have high heels.  Have rubber bottoms.  Are comfortable and fit you well.  Are closed at the toe. Do not wear sandals.  If you use a stepladder:  Make sure that it is fully opened. Do not climb a closed stepladder.  Make sure that both sides of the stepladder are locked into place.  Ask someone to hold it for you, if possible.  Clearly mark and make sure that you can see:  Any grab bars or handrails.  First and last steps.  Where the edge of each step is.  Use tools that help you move around (mobility aids) if they are needed. These include:  Canes.  Walkers.  Scooters.  Crutches.  Turn on the lights when you go into a dark area. Replace any light bulbs as soon as they burn out.  Set up your furniture so you have a clear path. Avoid moving your furniture around.  If any of your floors are uneven, fix them.  If there are any pets around you, be aware of where they are.  Review your medicines with your doctor. Some medicines can make you feel dizzy. This can increase your chance of falling. Ask your  doctor what other things that  you can do to help prevent falls. This information is not intended to replace advice given to you by your health care provider. Make sure you discuss any questions you have with your health care provider. Document Released: 11/18/2008 Document Revised: 06/30/2015 Document Reviewed: 02/26/2014 Elsevier Interactive Patient Education  2017 [employer].

## 2020-04-22 ENCOUNTER — Telehealth: Payer: Self-pay

## 2020-04-22 NOTE — Telephone Encounter (Signed)
pt states she can't take the topamax because it was makes her more depress, anxiety, not sleeping and she wants to try the one that she took before.

## 2020-04-22 NOTE — Telephone Encounter (Signed)
Attempted to contact patient, left voicemail.  

## 2020-04-26 ENCOUNTER — Other Ambulatory Visit: Payer: Self-pay | Admitting: Internal Medicine

## 2020-04-26 ENCOUNTER — Ambulatory Visit: Payer: PPO | Admitting: Internal Medicine

## 2020-04-26 ENCOUNTER — Other Ambulatory Visit: Payer: Self-pay | Admitting: Psychiatry

## 2020-04-26 DIAGNOSIS — K5900 Constipation, unspecified: Secondary | ICD-10-CM

## 2020-04-26 DIAGNOSIS — F331 Major depressive disorder, recurrent, moderate: Secondary | ICD-10-CM

## 2020-04-28 ENCOUNTER — Encounter: Payer: Self-pay | Admitting: Internal Medicine

## 2020-04-28 ENCOUNTER — Other Ambulatory Visit: Payer: Self-pay

## 2020-04-28 ENCOUNTER — Ambulatory Visit (INDEPENDENT_AMBULATORY_CARE_PROVIDER_SITE_OTHER): Payer: PPO | Admitting: Internal Medicine

## 2020-04-28 VITALS — BP 160/80 | HR 85 | Temp 97.9°F | Ht 64.0 in | Wt 129.2 lb

## 2020-04-28 DIAGNOSIS — Z1389 Encounter for screening for other disorder: Secondary | ICD-10-CM | POA: Diagnosis not present

## 2020-04-28 DIAGNOSIS — M8580 Other specified disorders of bone density and structure, unspecified site: Secondary | ICD-10-CM | POA: Diagnosis not present

## 2020-04-28 DIAGNOSIS — G4700 Insomnia, unspecified: Secondary | ICD-10-CM

## 2020-04-28 DIAGNOSIS — Z853 Personal history of malignant neoplasm of breast: Secondary | ICD-10-CM | POA: Diagnosis not present

## 2020-04-28 DIAGNOSIS — R5383 Other fatigue: Secondary | ICD-10-CM

## 2020-04-28 DIAGNOSIS — Z0000 Encounter for general adult medical examination without abnormal findings: Secondary | ICD-10-CM | POA: Diagnosis not present

## 2020-04-28 DIAGNOSIS — F39 Unspecified mood [affective] disorder: Secondary | ICD-10-CM | POA: Diagnosis not present

## 2020-04-28 DIAGNOSIS — Z1211 Encounter for screening for malignant neoplasm of colon: Secondary | ICD-10-CM

## 2020-04-28 DIAGNOSIS — K635 Polyp of colon: Secondary | ICD-10-CM

## 2020-04-28 DIAGNOSIS — E2839 Other primary ovarian failure: Secondary | ICD-10-CM | POA: Diagnosis not present

## 2020-04-28 DIAGNOSIS — J309 Allergic rhinitis, unspecified: Secondary | ICD-10-CM

## 2020-04-28 DIAGNOSIS — D126 Benign neoplasm of colon, unspecified: Secondary | ICD-10-CM | POA: Diagnosis not present

## 2020-04-28 DIAGNOSIS — I10 Essential (primary) hypertension: Secondary | ICD-10-CM

## 2020-04-28 DIAGNOSIS — I700 Atherosclerosis of aorta: Secondary | ICD-10-CM

## 2020-04-28 DIAGNOSIS — R2232 Localized swelling, mass and lump, left upper limb: Secondary | ICD-10-CM

## 2020-04-28 DIAGNOSIS — R7303 Prediabetes: Secondary | ICD-10-CM

## 2020-04-28 DIAGNOSIS — J449 Chronic obstructive pulmonary disease, unspecified: Secondary | ICD-10-CM

## 2020-04-28 NOTE — Patient Instructions (Addendum)
L theanine 100-200 mg at night for sleep/anxiety -nature made   Prediabetes Prediabetes is when your blood sugar (blood glucose) level is higher than normal but not high enough for you to be diagnosed with type 2 diabetes. Having prediabetes puts you at risk for developing type 2 diabetes (type 2 diabetes mellitus). With certain lifestyle changes, you may be able to prevent or delay the onset of type 2 diabetes. This is important because type 2 diabetes can lead to serious complications, such as:  Heart disease.  Stroke.  Blindness.  Kidney disease.  Depression.  Poor circulation in the feet and legs. In severe cases, this could lead to surgical removal of a leg (amputation). What are the causes? The exact cause of prediabetes is not known. It may result from insulin resistance. Insulin resistance develops when cells in the body do not respond properly to insulin that the body makes. This can cause excess glucose to build up in the blood. High blood glucose (hyperglycemia) can develop. What increases the risk? The following factors may make you more likely to develop this condition:  You have a family member with type 2 diabetes.  You are older than 45 years.  You had a temporary form of diabetes during a pregnancy (gestational diabetes).  You had polycystic ovary syndrome (PCOS).  You are overweight or obese.  You are inactive (sedentary).  You have a history of heart disease, including problems with cholesterol levels, high levels of blood fats, or high blood pressure. What are the signs or symptoms? You may have no symptoms. If you do have symptoms, they may include:  Increased hunger.  Increased thirst.  Increased urination.  Vision changes, such as blurry vision.  Tiredness (fatigue). How is this diagnosed? This condition can be diagnosed with blood tests. Your blood glucose may be checked with one or more of the following tests:  A fasting blood glucose (FBG)  test. You will not be allowed to eat (you will fast) for at least 8 hours before a blood sample is taken.  An A1C blood test (hemoglobin A1C). This test provides information about blood glucose levels over the previous 2?3 months.  An oral glucose tolerance test (OGTT). This test measures your blood glucose at two points in time: ? After fasting. This is your baseline level. ? Two hours after you drink a beverage that contains glucose. You may be diagnosed with prediabetes if:  Your FBG is 100?125 mg/dL (5.6-6.9 mmol/L).  Your A1C level is 5.7?6.4% (39-46 mmol/mol).  Your OGTT result is 140?199 mg/dL (7.8-11 mmol/L). These blood tests may be repeated to confirm your diagnosis.   How is this treated? Treatment may include dietary and lifestyle changes to help lower your blood glucose and prevent type 2 diabetes from developing. In some cases, medicine may be prescribed to help lower the risk of type 2 diabetes. Follow these instructions at home: Nutrition  Follow a healthy meal plan. This includes eating lean proteins, whole grains, legumes, fresh fruits and vegetables, low-fat dairy products, and healthy fats.  Follow instructions from your health care provider about eating or drinking restrictions.  Meet with a dietitian to create a healthy eating plan that is right for you.   Lifestyle  Do moderate-intensity exercise for at least 30 minutes a day on 5 or more days each week, or as told by your health care provider. A mix of activities may be best, such as: ? Brisk walking, swimming, biking, and weight lifting.  Lose weight  as told by your health care provider. Losing 5-7% of your body weight can reverse insulin resistance.  Do not drink alcohol if: ? Your health care provider tells you not to drink. ? You are pregnant, may be pregnant, or are planning to become pregnant.  If you drink alcohol: ? Limit how much you use to:  0-1 drink a day for women.  0-2 drinks a day for  men. ? Be aware of how much alcohol is in your drink. In the [place] one drink equals one 12 oz bottle of beer (355 mL), one 5 oz glass of wine (148 mL), or one 1 oz glass of hard liquor (44 mL). General instructions  Take over-the-counter and prescription medicines only as told by your health care provider. You may be prescribed medicines that help lower the risk of type 2 diabetes.  Do not use any products that contain nicotine or tobacco, such as cigarettes, e-cigarettes, and chewing tobacco. If you need help quitting, ask your health care provider.  Keep all follow-up visits. This is important. Where to find more information  American Diabetes Association: www.diabetes.org  Academy of Nutrition and Dietetics: www.eatright.org  American Heart Association: www.heart.org Contact a health care provider if:  You have any of these symptoms: ? Increased hunger. ? Increased urination. ? Increased thirst. ? Fatigue. ? Vision changes, such as blurry vision. Get help right away if you:  Have shortness of breath.  Feel confused.  Vomit or feel like you may vomit. Summary  Prediabetes is when your blood sugar (blood glucose)level is higher than normal but not high enough for you to be diagnosed with type 2 diabetes.  Having prediabetes puts you at risk for developing type 2 diabetes (type 2 diabetes mellitus).  Make lifestyle changes such as eating a healthy diet and exercising regularly to help prevent diabetes. Lose weight as told by your health care provider. This information is not intended to replace advice given to you by your health care provider. Make sure you discuss any questions you have with your health care provider. Document Revised: 04/23/2019 Document Reviewed: 04/23/2019 Elsevier Patient Education  [place].

## 2020-04-28 NOTE — Progress Notes (Signed)
Chief Complaint  Patient presents with  . Follow-up    F/u; fasting labs   Annual  1. BP sl elevated today on lasix 40, micardis 80, toprol 25 mg qd repeat more elevated given BP log to monitor she had black coffee with meds this am Fasting for labs today  2. C/o insomnia stopped lunesta by psych and she wants to simplify med list reached out to Dr. Shea Evans who will address with pt at next aptt  She reports had cut back etoh and this is why lunesta was stopped per psych  Of note she stopped topamax did not like the way it made her feel and still not sleeping and previously did not like abilify made her crave sweets  3. Screening colonoscopy due agreeable [facility]  4. C/l pain/mass left axilla and mammo and [place] sch 05/18/20 [facility] will order dexa as well  She has h/o left breast cancer and is on femara 2.5 mg qd   Review of Systems  Constitutional: Positive for malaise/fatigue. Negative for weight loss.  HENT: Negative for hearing loss.   Eyes: Negative for blurred vision.  Respiratory: Negative for shortness of breath.   Cardiovascular: Negative for chest pain.  Gastrointestinal: Negative for abdominal pain.  Genitourinary: Negative for dysuria.  Musculoskeletal: Negative for falls.  Skin: Negative for rash.  Neurological: Negative for headaches.  Psychiatric/Behavioral: Positive for depression. The patient has insomnia.    Past Medical History:  Diagnosis Date  . Anxiety   . Breast cancer ([place])    Left breast(nipple mass) 2018  . Cervical stenosis of spine   . Chicken pox   . Chronic back pain   . Colon polyps   . COPD (chronic obstructive pulmonary disease) ([place])   . Cyst of left nipple   . Depression   . Hypertension   . Insomnia   . Personal history of radiation therapy    Past Surgical History:  Procedure Laterality Date  . BACK SURGERY     x 3 (2010 x 2; 2013 x 1)   . BREAST BIOPSY Left    core- neg  . BREAST LUMPECTOMY Left 2018  .  BREAST SURGERY Left    breast biopsy  . CERVICAL CONE BIOPSY    . CERVICAL FUSION     times 2  . COLONOSCOPY WITH PROPOFOL N/A 04/18/2015   Procedure: COLONOSCOPY WITH PROPOFOL;  Surgeon: [name], MD;  Location: [facility] ENDOSCOPY;  Service: Endoscopy;  Laterality: N/A;  . ELBOW SURGERY     2014 b/l elbows per pt ulnar nerve   . ELBOW SURGERY     x2   . EPIDURAL BLOCK INJECTION     Dr. Maryjean Ka   . MASS EXCISION Left 04/12/2016   Procedure: EXCISION OF LEFT NIPPLE MASS;  Surgeon: Stark Klein, MD;  Location: [place];  Service: General;  Laterality: Left;  . NECK SURGERY    . POSTERIOR CERVICAL FUSION/FORAMINOTOMY  03/07/2011   Procedure: POSTERIOR CERVICAL FUSION/FORAMINOTOMY LEVEL 4;  Surgeon: Eustace Moore, MD;  Location: [facility];  Service: Neurosurgery;  Laterality: N/A;  cervical three to thoracic one posterior cervical fusion   . SENTINEL NODE BIOPSY Left 05/29/2016   Procedure: LEFT SENTINEL LYMPH NODE BIOPSY;  Surgeon: Stark Klein, MD;  Location: [facility];  Service: General;  Laterality: Left;  . TONSILLECTOMY     Family History  Problem Relation Age of Onset  . Breast cancer Mother 37  .  Hypertension Mother   . Cancer Mother        breast dx'ed 44   . Cancer Father        prostate  . Hypertension Father   . Drug abuse Daughter   . Hypertension Maternal Grandmother   . Breast cancer Cousin    Social History   Socioeconomic History  . Marital status: Divorced    Spouse name: Not on file  . Number of children: 2  . Years of education: Not on file  . Highest education level: Not on file  Occupational History  . Not on file  Tobacco Use  . Smoking status: Current Every Day Smoker    Packs/day: 0.25    Years: 30.00    Pack years: 7.50    Types: Cigarettes  . Smokeless tobacco: Never Used  . Tobacco comment: work days - 6 cigarettes; weekends - ~10   Vaping Use  . Vaping Use: Every day  Substance and Sexual Activity  . Alcohol use: Yes     Comment: social  . Drug use: No    Comment: on opana since jan 2018  . Sexual activity: Not on file  Other Topics Concern  . Not on file  Social History Narrative   As of 01/25/17 not sexually active of in relationship    Divorced   2 kids son and daughter (though daughter died of suicide in May 22, 2017)   Son lives in [place] now as of 12/2018       Disability    Loves animals has 3 dogs           Social Determinants of Radio broadcast assistant Strain: Low Risk   . Difficulty of Paying Living Expenses: Not hard at all  Food Insecurity: No Food Insecurity  . Worried About Charity fundraiser in the Last Year: Never true  . Ran Out of Food in the Last Year: Never true  Transportation Needs: No Transportation Needs  . Lack of Transportation (Medical): No  . Lack of Transportation (Non-Medical): No  Physical Activity: Not on file  Stress: Stress Concern Present  . Feeling of Stress : Rather much  Social Connections: Unknown  . Frequency of Communication with Friends and Family: More than three times a week  . Frequency of Social Gatherings with Friends and Family: Not on file  . Attends Religious Services: Not on file  . Active Member of Clubs or Organizations: Not on file  . Attends Archivist Meetings: Not on file  . Marital Status: Not on file  Intimate Partner Violence: Not At Risk  . Fear of Current or Ex-Partner: No  . Emotionally Abused: No  . Physically Abused: No  . Sexually Abused: No   Current Meds  Medication Sig  . azelastine (ASTELIN) 0.1 % nasal spray   . buPROPion (WELLBUTRIN XL) 150 MG 24 hr tablet TAKE 1 TABLET BY MOUTH DAILY, TO BE COMBINED WITH 300MG  EVERY MORNING AS DIRECTED  . buPROPion (WELLBUTRIN XL) 300 MG 24 hr tablet Take 1 tablet (300 mg total) by mouth daily. Further refills psych Dr. Shea Evans  . cyclobenzaprine (FLEXERIL) 10 MG tablet Take 10 mg by mouth every 8 (eight) hours as needed.  . DULoxetine (CYMBALTA) 60 MG capsule Take  1 capsule (60 mg total) by mouth 2 (two) times daily.  . fluticasone (FLONASE) 50 MCG/ACT nasal spray TAKE 2 SPRAYS INTO BOTH NOSTRILS DAILY  . gabapentin (NEURONTIN) 600 MG tablet Take 1,200 mg by  mouth 3 (three) times daily.  . hydrOXYzine (VISTARIL) 25 MG capsule TAKE 1 CAPSULE EVERY EIGHT HOURS AS NEEDED FOR ANXIETY  . ipratropium (ATROVENT) 0.06 % nasal spray Place 2 sprays into both nostrils 3 (three) times daily. prn  . letrozole (FEMARA) 2.5 MG tablet TAKE ONE TABLET BY MOUTH EVERY DAY  . Multiple Vitamins-Minerals (MULTIVITAMIN) tablet Take 1 tablet by mouth daily.  Marland Kitchen oxymorphone (OPANA) 10 MG tablet Take 10 mg by mouth 2 (two) times daily.  . SENEXON-S 8.6-50 MG tablet TAKE 1-2 TABLETS BY MOUTH DAILY AS NEEDED FOR MILD CONSTIPATION.  . [DISCONTINUED] albuterol (VENTOLIN HFA) 108 (90 Base) MCG/ACT inhaler Inhale 1-2 puffs into the lungs every 4 (four) hours as needed for wheezing or shortness of breath.  . [DISCONTINUED] furosemide (LASIX) 40 MG tablet TAKE ONE TABLET (40 MG) BY MOUTH EVERY MORNING - D/C HCTZ+TELMISARTAN COMBO PILL  . [DISCONTINUED] metoprolol succinate (TOPROL-XL) 25 MG 24 hr tablet Take 1 tablet (25 mg total) by mouth daily.  . [DISCONTINUED] montelukast (SINGULAIR) 10 MG tablet Take 1 tablet (10 mg total) by mouth at bedtime.  . [DISCONTINUED] telmisartan (MICARDIS) 80 MG tablet Take 1 tablet (80 mg total) by mouth daily.  . [DISCONTINUED] Tiotropium Bromide-Olodaterol (STIOLTO RESPIMAT) 2.5-2.5 MCG/ACT AERS Inhale 2 puffs into the lungs daily.  . [DISCONTINUED] topiramate (TOPAMAX) 50 MG tablet Take 1 tablet (50 mg total) by mouth 2 (two) times daily.   Allergies  Allergen Reactions  . Anastrozole   . Augmentin [Amoxicillin-Pot Clavulanate]     Diarrhea   . Norvasc [Amlodipine]     Swelling   . Topamax [Topiramate]     Didn't like the way made feel more irritatble/sad   . Sulfa Antibiotics Itching and Rash  . Sulfonamide Derivatives Itching and Rash    Recent Results (from the past 2160 hour(s))  Comprehensive metabolic panel     Status: Abnormal   Collection Time: 04/28/20  3:46 PM  Result Value Ref Range   Sodium 142 135 - 145 mEq/L   Potassium 3.9 3.5 - 5.1 mEq/L   Chloride 101 96 - 112 mEq/L   CO2 31 19 - 32 mEq/L   Glucose, Bld 101 (H) 70 - 99 mg/dL   BUN 12 6 - 23 mg/dL   Creatinine, Ser 0.75 0.40 - 1.20 mg/dL   Total Bilirubin 0.6 0.2 - 1.2 mg/dL   Alkaline Phosphatase 81 39 - 117 U/L   AST 14 0 - 37 U/L   ALT 13 0 - 35 U/L   Total Protein 6.5 6.0 - 8.3 g/dL   Albumin 4.1 3.5 - 5.2 g/dL   GFR 84.46 >60.00 mL/min    Comment: Calculated using the CKD-EPI Creatinine Equation (2021)   Calcium 9.5 8.4 - 10.5 mg/dL  Lipid panel     Status: None   Collection Time: 04/28/20  3:46 PM  Result Value Ref Range   Cholesterol 186 0 - 200 mg/dL    Comment: ATP III Classification       Desirable:  < 200 mg/dL               Borderline High:  200 - 239 mg/dL          High:  > = 240 mg/dL   Triglycerides 73.0 0.0 - 149.0 mg/dL    Comment: Normal:  <150 mg/dLBorderline High:  150 - 199 mg/dL   HDL 95.00 >39.00 mg/dL   VLDL 14.6 0.0 - 40.0 mg/dL   LDL Cholesterol 76 0 -  99 mg/dL   Total CHOL/HDL Ratio 2     Comment:                Men          Women1/2 Average Risk     3.4          3.3Average Risk          5.0          4.42X Average Risk          9.6          7.13X Average Risk          15.0          11.0                       NonHDL 90.86     Comment: NOTE:  Non-HDL goal should be 30 mg/dL higher than patient's LDL goal (i.e. LDL goal of < 70 mg/dL, would have non-HDL goal of < 100 mg/dL)  CBC with Differential/Platelet     Status: None   Collection Time: 04/28/20  3:46 PM  Result Value Ref Range   WBC 5.4 4.0 - 10.5 K/uL   RBC 3.95 3.87 - 5.11 Mil/uL   Hemoglobin 12.4 12.0 - 15.0 g/dL   HCT 36.9 36.0 - 46.0 %   MCV 93.4 78.0 - 100.0 fl   MCHC 33.6 30.0 - 36.0 g/dL   RDW 13.2 11.5 - 15.5 %   Platelets 256.0 150.0 - 400.0 K/uL    Neutrophils Relative % 60.6 43.0 - 77.0 %   Lymphocytes Relative 29.0 12.0 - 46.0 %   Monocytes Relative 7.8 3.0 - 12.0 %   Eosinophils Relative 1.1 0.0 - 5.0 %   Basophils Relative 1.5 0.0 - 3.0 %   Neutro Abs 3.3 1.4 - 7.7 K/uL   Lymphs Abs 1.6 0.7 - 4.0 K/uL   Monocytes Absolute 0.4 0.1 - 1.0 K/uL   Eosinophils Absolute 0.1 0.0 - 0.7 K/uL   Basophils Absolute 0.1 0.0 - 0.1 K/uL  Hemoglobin A1c     Status: None   Collection Time: 04/28/20  3:46 PM  Result Value Ref Range   Hgb A1c MFr Bld 5.4 4.6 - 6.5 %    Comment: Glycemic Control Guidelines for People with Diabetes:Non Diabetic:  <6%Goal of Therapy: <7%Additional Action Suggested:  >8%   Urinalysis, Routine w reflex microscopic     Status: Abnormal   Collection Time: 04/28/20  3:46 PM  Result Value Ref Range   Specific Gravity, UA 1.014 1.005 - 1.030   pH, UA 5.0 5.0 - 7.5   Color, UA Yellow Yellow   Appearance Ur Cloudy (A) Clear   Leukocytes,UA Negative Negative   Protein,UA Negative Negative/Trace   Glucose, UA Negative Negative   Ketones, UA Negative Negative   RBC, UA Negative Negative   Bilirubin, UA Negative Negative   Urobilinogen, Ur 0.2 0.2 - 1.0 mg/dL   Nitrite, UA Negative Negative   Microscopic Examination Comment     Comment: Microscopic not indicated and not performed.   Objective  Body mass index is 22.19 kg/m. Wt Readings from Last 3 Encounters:  04/28/20 129 lb 4 oz (58.6 kg)  04/20/20 131 lb (59.4 kg)  09/23/19 131 lb 6.4 oz (59.6 kg)   Temp Readings from Last 3 Encounters:  04/28/20 97.9 F (36.6 C)  09/23/19 98.2 F (36.8 C) (Oral)  06/12/19 97.8 F (36.6 C) (Temporal)  BP Readings from Last 3 Encounters:  04/28/20 (!) 160/80  09/23/19 112/70  07/16/19 112/65   Pulse Readings from Last 3 Encounters:  04/28/20 85  09/23/19 86  07/16/19 76    Physical Exam Vitals and nursing note reviewed.  Constitutional:      Appearance: Normal appearance. She is well-developed and  well-groomed.  HENT:     Head: Normocephalic and atraumatic.     Right Ear: Tympanic membrane normal.     Left Ear: Tympanic membrane normal.  Eyes:     Conjunctiva/sclera: Conjunctivae normal.     Pupils: Pupils are equal, round, and reactive to light.  Cardiovascular:     Rate and Rhythm: Normal rate and regular rhythm.     Heart sounds: Normal heart sounds. No murmur heard.   Pulmonary:     Effort: Pulmonary effort is normal.     Breath sounds: Normal breath sounds.  Chest:  Breasts:     Left: No axillary adenopathy.    Abdominal:     Tenderness: There is no abdominal tenderness.  Lymphadenopathy:     Upper Body:     Left upper body: No axillary adenopathy.  Skin:    General: Skin is warm and dry.  Neurological:     General: No focal deficit present.     Mental Status: She is alert and oriented to person, place, and time. Mental status is at baseline.     Gait: Gait normal.  Psychiatric:        Attention and Perception: Attention and perception normal.        Mood and Affect: Mood and affect normal.        Speech: Speech normal.        Behavior: Behavior normal. Behavior is cooperative.        Thought Content: Thought content normal.        Cognition and Memory: Cognition and memory normal.        Judgment: Judgment normal.     Assessment  Plan  Annual physical exam Flu shot utd Tdap UTD pna 23utd prevnar will need age 64  covid shot 3/3 rec shingrix vaccine as wellfor future  Pap 06/12/19 neg neg HPV   breast exam 06/12/19 normal [place] sch 05/18/20 and mammo with lymph swelling and pain will try to do dexa  mammowith h/o left breast cancer3/8/18 invasive ductal ca grade 1 0.8 cm -h/o breast cancerleft  -letrozole likely causing sweating/hot flashes she will f/u with h/o in 08/2018 06/08/19 negative mammogram had f/u [name] 09/06/20 Dr. Barry Dienes surgeryfollowing as well  dexa 05/01/17 osteopeniarec calcium and vitamin Drepeat in 3-5  years  Colonoscopy had 04/18/15 [facility] Ivor Messier since she had a polyp removed in a difficult area on he did this -referred [facility] 04/2020 for f/u 06/2020   Hep C neg 01/20/16   rec smoking cessationCOPDat least 10 cig per day smoking as of5/7/21 -07/02/19 on Stiolto per catie pharm D was on anoro but became costly and prn albuterol  Saw Dr. Midge Minium chronic neck and back pain  Allergic rhinitis Ambulatory referral to ENTDr. Tami Ribas or Vaught to w/u hoarseness and allergies Hoarse - Plan: Ambulatory referral to ENTDr. Tami Ribas or Vaught further w/u Saw 08/28/18 Rx Astepro 0.1 nasal spray, omeprazole 20 mg qd, medrol 4 mg dose pak noted Reinke Edema and GERD cultures taken+fungal per pts/p 2 rounds antibiotics I.e Doxycycline may need  a 3rd and consider CT neck if persists f/u in 3 weeks   Dr. Pryor Ochoa ENTf/u prn seen week of 11/16/2020and doing better as of today  H/o input labs 12/18/19 with elevated neutrophils  Agree with diet and exercise  Not worried about the WBC numbers since it may be related to Inflammation and its predominantly Neutrophils  Thanks  Vinay Gudnea  rec healthy diet and exercise and smoking cessation  Osteopenia, unspecified location - Plan: DG Bone Density try to sch 05/18/20 with mammo [facility] H/o left breast cancer Estrogen deficiency - Plan: DG Bone Density  Hypertension, aortic atherosclerosis - Plan: Comprehensive metabolic panel, Lipid panel, CBC with Differential/Platelet, Urinalysis, Routine w reflex microscopic  telmisartan (MICARDIS) 80 MG tablet, furosemide (LASIX) 40 MG tablet, metoprolol succinate (TOPROL-XL) 25 MG 24 hr tablet Monitor BP  Healthy diet and exercise  Prediabetes - Plan: Hemoglobin A1c  Axillary mass, left - Plan: CT ABDOMEN PELVIS W WO CONTRAST History of left breast cancer Ct scan r/o mets ab/pelvis last ct chest  05/2017   Mood disorder ([place]) - Plan: messaged Dr. Shea Evans pt to f/u with Dr. Shea Evans pt wants meds for sleep stopped topamax not working and didn't like the way 50 mg bid mad her feel wants med simplification  Insomnia, unspecified type  Chronic obstructive pulmonary disease, unspecified COPD type ([place]) - Plan: Tiotropium Bromide-Olodaterol (STIOLTO RESPIMAT) 2.5-2.5 MCG/ACT AERS, albuterol (VENTOLIN HFA) 108 (90 Base) MCG/ACT inhaler rec smoking cessation   Allergic rhinitis, unspecified seasonality, unspecified trigger - Plan: montelukast (SINGULAIR) 10 MG tablet)   Provider: Dr. Olivia Mackie McLean-Scocuz[facility]

## 2020-04-29 LAB — CBC WITH DIFFERENTIAL/PLATELET
Basophils Absolute: 0.1 10*3/uL (ref 0.0–0.1)
Basophils Relative: 1.5 % (ref 0.0–3.0)
Eosinophils Absolute: 0.1 10*3/uL (ref 0.0–0.7)
Eosinophils Relative: 1.1 % (ref 0.0–5.0)
HCT: 36.9 % (ref 36.0–46.0)
Hemoglobin: 12.4 g/dL (ref 12.0–15.0)
Lymphocytes Relative: 29 % (ref 12.0–46.0)
Lymphs Abs: 1.6 10*3/uL (ref 0.7–4.0)
MCHC: 33.6 g/dL (ref 30.0–36.0)
MCV: 93.4 fl (ref 78.0–100.0)
Monocytes Absolute: 0.4 10*3/uL (ref 0.1–1.0)
Monocytes Relative: 7.8 % (ref 3.0–12.0)
Neutro Abs: 3.3 10*3/uL (ref 1.4–7.7)
Neutrophils Relative %: 60.6 % (ref 43.0–77.0)
Platelets: 256 10*3/uL (ref 150.0–400.0)
RBC: 3.95 Mil/uL (ref 3.87–5.11)
RDW: 13.2 % (ref 11.5–15.5)
WBC: 5.4 10*3/uL (ref 4.0–10.5)

## 2020-04-29 LAB — COMPREHENSIVE METABOLIC PANEL
ALT: 13 U/L (ref 0–35)
AST: 14 U/L (ref 0–37)
Albumin: 4.1 g/dL (ref 3.5–5.2)
Alkaline Phosphatase: 81 U/L (ref 39–117)
BUN: 12 mg/dL (ref 6–23)
CO2: 31 mEq/L (ref 19–32)
Calcium: 9.5 mg/dL (ref 8.4–10.5)
Chloride: 101 mEq/L (ref 96–112)
Creatinine, Ser: 0.75 mg/dL (ref 0.40–1.20)
GFR: 84.46 mL/min (ref >60.00)
Glucose, Bld: 101 mg/dL — ABNORMAL HIGH (ref 70–99)
Potassium: 3.9 mEq/L (ref 3.5–5.1)
Sodium: 142 mEq/L (ref 135–145)
Total Bilirubin: 0.6 mg/dL (ref 0.2–1.2)
Total Protein: 6.5 g/dL (ref 6.0–8.3)

## 2020-04-29 LAB — LIPID PANEL
Cholesterol: 186 mg/dL (ref 0–200)
HDL: 95 mg/dL (ref >39.00)
LDL Cholesterol: 76 mg/dL (ref 0–99)
NonHDL: 90.86
Total CHOL/HDL Ratio: 2
Triglycerides: 73 mg/dL (ref 0.0–149.0)
VLDL: 14.6 mg/dL (ref 0.0–40.0)

## 2020-04-29 LAB — URINALYSIS, ROUTINE W REFLEX MICROSCOPIC
Bilirubin, UA: NEGATIVE
Glucose, UA: NEGATIVE
Ketones, UA: NEGATIVE
Leukocytes,UA: NEGATIVE
Nitrite, UA: NEGATIVE
Protein,UA: NEGATIVE
RBC, UA: NEGATIVE
Specific Gravity, UA: 1.014 (ref 1.005–1.030)
Urobilinogen, Ur: 0.2 mg/dL (ref 0.2–1.0)
pH, UA: 5 (ref 5.0–7.5)

## 2020-04-29 LAB — HEMOGLOBIN A1C: Hgb A1c MFr Bld: 5.4 % (ref 4.6–6.5)

## 2020-05-02 ENCOUNTER — Telehealth: Payer: Self-pay

## 2020-05-02 ENCOUNTER — Telehealth: Payer: Self-pay | Admitting: Internal Medicine

## 2020-05-02 DIAGNOSIS — I700 Atherosclerosis of aorta: Secondary | ICD-10-CM | POA: Insufficient documentation

## 2020-05-02 MED ORDER — MONTELUKAST SODIUM 10 MG PO TABS: 10.0000 mg | ORAL_TABLET | Freq: Every day | ORAL | 3 refills | Status: DC

## 2020-05-02 MED ORDER — TELMISARTAN 80 MG PO TABS: 80.0000 mg | ORAL_TABLET | Freq: Every day | ORAL | 3 refills | Status: DC

## 2020-05-02 MED ORDER — ALBUTEROL SULFATE HFA 108 (90 BASE) MCG/ACT IN AERS
1.0000 | INHALATION_SPRAY | RESPIRATORY_TRACT | 12 refills | Status: DC | PRN
Start: 2020-05-02 — End: 2021-05-05

## 2020-05-02 MED ORDER — FUROSEMIDE 40 MG PO TABS: 40.0000 mg | ORAL_TABLET | Freq: Every day | ORAL | 3 refills | Status: DC

## 2020-05-02 MED ORDER — STIOLTO RESPIMAT 2.5-2.5 MCG/ACT IN AERS: 2.0000 | INHALATION_SPRAY | Freq: Every day | RESPIRATORY_TRACT | 11 refills | Status: DC

## 2020-05-02 MED ORDER — METOPROLOL SUCCINATE ER 25 MG PO TB24: 25.0000 mg | ORAL_TABLET | Freq: Every day | ORAL | 3 refills | Status: DC

## 2020-05-02 NOTE — Telephone Encounter (Signed)
lvm to return in about 3 months (around 07/29/2020) for 3-4 months in person

## 2020-05-02 NOTE — Progress Notes (Signed)
Good afternoon!  GI did not have anything avail on the same day as pt mammo. I was able to get pt resch  on 05/14/2020. I left pt a vm with the upcoming appt and number to resch if the appt does not work for pt.

## 2020-05-02 NOTE — Telephone Encounter (Signed)
lft vm for pt to call ofc to sch CT. thanks

## 2020-05-04 ENCOUNTER — Other Ambulatory Visit: Payer: Self-pay | Admitting: Internal Medicine

## 2020-05-04 ENCOUNTER — Telehealth: Payer: Self-pay | Admitting: Internal Medicine

## 2020-05-04 ENCOUNTER — Other Ambulatory Visit: Payer: Self-pay | Admitting: Psychiatry

## 2020-05-04 DIAGNOSIS — R0982 Postnasal drip: Secondary | ICD-10-CM

## 2020-05-04 NOTE — Telephone Encounter (Signed)
Pt would like a call regarding her lab results. Please advise and Thank you!  Call pt @ [phone]

## 2020-05-05 NOTE — Telephone Encounter (Signed)
Pt returned your call.  

## 2020-05-05 NOTE — Telephone Encounter (Signed)
Left message to return call 

## 2020-05-06 NOTE — Telephone Encounter (Signed)
Patient was informed of results 

## 2020-05-06 NOTE — Telephone Encounter (Signed)
For blood pressure she should be on micardis/telmisartan 80 mg daily, lasix 40 mg daily, metoprolol xl 25 mg daily  Is she taking daily?  What has BP been since she left the office 2 hours after taking medications?  Have her log this and drop off log in 2 weeks   Liver kidneys normal  Cholesterol improved   Prediabetes resolved  Urine cloudy make sure drinking enough water 55-64 ounces daily   Spoke with Dr. Shea Evans and she states she will discuss meds for mental health and sleep at your next appt though I cant see when this is if you need a sooner appt call her office to schedule sooner appt   Take care

## 2020-05-06 NOTE — Telephone Encounter (Signed)
Patient states she is taking all BP medications. State she has not started checking her BP yet.   She will check for 2 weeks and then send these back in   Patient sees Dr Shea Evans on Monday

## 2020-05-09 ENCOUNTER — Telehealth (HOSPITAL_COMMUNITY): Payer: Self-pay

## 2020-05-09 ENCOUNTER — Telehealth (INDEPENDENT_AMBULATORY_CARE_PROVIDER_SITE_OTHER): Payer: PPO | Admitting: Psychiatry

## 2020-05-09 ENCOUNTER — Encounter: Payer: Self-pay | Admitting: Psychiatry

## 2020-05-09 ENCOUNTER — Other Ambulatory Visit: Payer: Self-pay

## 2020-05-09 DIAGNOSIS — F1010 Alcohol abuse, uncomplicated: Secondary | ICD-10-CM | POA: Diagnosis not present

## 2020-05-09 DIAGNOSIS — F339 Major depressive disorder, recurrent, unspecified: Secondary | ICD-10-CM | POA: Diagnosis not present

## 2020-05-09 DIAGNOSIS — F17200 Nicotine dependence, unspecified, uncomplicated: Secondary | ICD-10-CM | POA: Diagnosis not present

## 2020-05-09 MED ORDER — LAMOTRIGINE 25 MG PO TABS: 25.0000 mg | ORAL_TABLET | Freq: Every day | ORAL | 1 refills | Status: DC

## 2020-05-09 MED ORDER — BUPROPION HCL ER (XL) 300 MG PO TB24
300.0000 mg | ORAL_TABLET | Freq: Every day | ORAL | 1 refills | Status: DC
Start: 2020-05-09 — End: 2020-11-10

## 2020-05-09 NOTE — Progress Notes (Signed)
Virtual Visit via Video Note  I connected with Ashley Cross on 05/09/20 at  9:00 AM EDT by a video enabled telemedicine application and verified that I am speaking with the correct person using two identifiers.  Location Provider Location : [facility] Patient Location : Home  Participants: Patient , Provider   I discussed the limitations of evaluation and management by telemedicine and the availability of in person appointments. The patient expressed understanding and agreed to proceed.   I discussed the assessment and treatment plan with the patient. The patient was provided an opportunity to ask questions and all were answered. The patient agreed with the plan and demonstrated an understanding of the instructions.   The patient was advised to call back or seek an in-person evaluation if the symptoms worsen or if the condition fails to improve as anticipated.   [place] MD OP Progress Note  05/09/2020 1:01 PM Ashley Cross  MRN:  [id number]  Chief Complaint:  Chief Complaint    Follow-up; Depression; Anxiety     HPI: Ashley Cross is a 65 year old Caucasian female on disability, lives in [place], has a history of MDD, alcohol use disorder, history of breast cancer, chronic pain, hypertension was evaluated by telemedicine today.  Patient today reports she is currently struggling with depressive symptoms.  She has been sleeping excessively during the day.  She reports she does that when she wants to escape from reality and she feels depressed.  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  She reports sleep has improved at night.  She has been taking hydroxyzine as needed which helps.  Patient continues to struggle with low energy, concentration problems, feeling lonely, increased appetite and so on.  She reports she is currently taking the higher dosage of Cymbalta which  helps.  She just started the higher dosage couple of weeks ago.  Denies side effects.  Patient denies any suicidality or homicidality.  Patient reports she is cutting back on her alcohol intake and currently drinks around 3 glasses of wine per week.  She is not binging on alcohol.  Patient reports she is also trying to cut back on smoking cigarettes.  She continues to follow-up with her therapist and reports therapy sessions are beneficial.  Visit Diagnosis:    ICD-10-CM   1. MDD (major depressive disorder), recurrent episode, with atypical features (HCC)  F33.9 lamoTRIgine (LAMICTAL) 25 MG tablet  2. Alcohol use disorder, mild, abuse  F10.10 buPROPion (WELLBUTRIN XL) 300 MG 24 hr tablet   in partial remission  3. Tobacco use disorder  F17.200     Past Psychiatric History: I have reviewed past psychiatric history from my progress note on 09/16/2018.  Past trials of Zoloft, Wellbutrin, Lexapro, Celexa  Past Medical History:  Past Medical History:  Diagnosis Date  . Anxiety   . Breast cancer ([place])    Left breast(nipple mass) 2018  . Cervical stenosis of spine   . Chicken pox   . Chronic back pain   . Colon polyps   . COPD (chronic obstructive pulmonary disease) ([place])   . Cyst of left nipple   . Depression   . Hypertension   . Insomnia   . Personal history of radiation therapy     Past Surgical History:  Procedure Laterality Date  . BACK SURGERY     x 3 (2010 x 2; 2013 x  1)   . BREAST BIOPSY Left    core- neg  . BREAST LUMPECTOMY Left 2016-05-09  . BREAST SURGERY Left    breast biopsy  . CERVICAL CONE BIOPSY    . CERVICAL FUSION     times 2  . COLONOSCOPY WITH PROPOFOL N/A 04/18/2015   Procedure: COLONOSCOPY WITH PROPOFOL;  Surgeon: [name], MD;  Location: [facility] ENDOSCOPY;  Service: Endoscopy;  Laterality: N/A;  . ELBOW SURGERY     05/09/12 b/l elbows per pt ulnar nerve   . ELBOW SURGERY     x2   . EPIDURAL BLOCK INJECTION     Dr. Maryjean Ka   . MASS EXCISION Left  04/12/2016   Procedure: EXCISION OF LEFT NIPPLE MASS;  Surgeon: Stark Klein, MD;  Location: [facility];  Service: General;  Laterality: Left;  . NECK SURGERY    . POSTERIOR CERVICAL FUSION/FORAMINOTOMY  03/07/2011   Procedure: POSTERIOR CERVICAL FUSION/FORAMINOTOMY LEVEL 4;  Surgeon: Eustace Moore, MD;  Location: [facility] NEURO ORS;  Service: Neurosurgery;  Laterality: N/A;  cervical three to thoracic one posterior cervical fusion   . SENTINEL NODE BIOPSY Left 05/29/2016   Procedure: LEFT SENTINEL LYMPH NODE BIOPSY;  Surgeon: Stark Klein, MD;  Location: [facility];  Service: General;  Laterality: Left;  . TONSILLECTOMY      Family Psychiatric History: Reviewed family psychiatric history from my progress note on 09/16/2018.  Family History:  Family History  Problem Relation Age of Onset  . Breast cancer Mother 3  . Hypertension Mother   . Cancer Mother        breast dx'ed 29   . Cancer Father        prostate  . Hypertension Father   . Drug abuse Daughter   . Hypertension Maternal Grandmother   . Breast cancer Cousin     Social History: I have reviewed social history from my progress note on 09/16/2018. Social History   Socioeconomic History  . Marital status: Divorced    Spouse name: Not on file  . Number of children: 2  . Years of education: Not on file  . Highest education level: Not on file  Occupational History  . Not on file  Tobacco Use  . Smoking status: Current Every Day Smoker    Packs/day: 0.25    Years: 30.00    Pack years: 7.50    Types: Cigarettes  . Smokeless tobacco: Never Used  . Tobacco comment: work days - 6 cigarettes; weekends - ~10   Vaping Use  . Vaping Use: Every day  Substance and Sexual Activity  . Alcohol use: Yes    Comment: social  . Drug use: No    Comment: on opana since jan 2018  . Sexual activity: Not on file  Other Topics Concern  . Not on file  Social History Narrative   As of 01/25/17 not sexually active of in relationship     Divorced   2 kids son and daughter (though daughter died of suicide in 2017/05/09)   Son lives in [place] now as of 12/2018       Disability    Loves animals has 3 dogs           Social Determinants of Radio broadcast assistant Strain: Low Risk   . Difficulty of Paying Living Expenses: Not hard at all  Food Insecurity: No Food Insecurity  . Worried About Charity fundraiser in the Last Year: Never true  .  Ran Out of Food in the Last Year: Never true  Transportation Needs: No Transportation Needs  . Lack of Transportation (Medical): No  . Lack of Transportation (Non-Medical): No  Physical Activity: Not on file  Stress: Stress Concern Present  . Feeling of Stress : Rather much  Social Connections: Unknown  . Frequency of Communication with Friends and Family: More than three times a week  . Frequency of Social Gatherings with Friends and Family: Not on file  . Attends Religious Services: Not on file  . Active Member of Clubs or Organizations: Not on file  . Attends Archivist Meetings: Not on file  . Marital Status: Not on file    Allergies:  Allergies  Allergen Reactions  . Anastrozole   . Augmentin [Amoxicillin-Pot Clavulanate]     Diarrhea   . Norvasc [Amlodipine]     Swelling   . Topamax [Topiramate]     Didn't like the way made feel more irritatble/sad   . Sulfa Antibiotics Itching and Rash  . Sulfonamide Derivatives Itching and Rash    Metabolic Disorder Labs: Lab Results  Component Value Date   HGBA1C 5.4 04/28/2020   No results found for: PROLACTIN Lab Results  Component Value Date   CHOL 186 04/28/2020   TRIG 73.0 04/28/2020   HDL 95.00 04/28/2020   CHOLHDL 2 04/28/2020   VLDL 14.6 04/28/2020   LDLCALC 76 04/28/2020   LDLCALC 85 08/25/2019   Lab Results  Component Value Date   TSH 0.71 08/25/2019   TSH 2.91 04/03/2019    Therapeutic Level Labs: No results found for: LITHIUM No results found for: VALPROATE No components found  for:  CBMZ  Current Medications: Current Outpatient Medications  Medication Sig Dispense Refill  . lamoTRIgine (LAMICTAL) 25 MG tablet Take 1 tablet (25 mg total) by mouth daily. 30 tablet 1  . albuterol (VENTOLIN HFA) 108 (90 Base) MCG/ACT inhaler Inhale 1-2 puffs into the lungs every 4 (four) hours as needed for wheezing or shortness of breath. 18 g 12  . azelastine (ASTELIN) 0.1 % nasal spray     . buPROPion (WELLBUTRIN XL) 300 MG 24 hr tablet Take 1 tablet (300 mg total) by mouth daily. 90 tablet 1  . cyclobenzaprine (FLEXERIL) 10 MG tablet Take 10 mg by mouth every 8 (eight) hours as needed.    . DULoxetine (CYMBALTA) 60 MG capsule Take 1 capsule (60 mg total) by mouth 2 (two) times daily. 60 capsule 1  . fluticasone (FLONASE) 50 MCG/ACT nasal spray TAKE 2 SPRAYS INTO BOTH NOSTRILS DAILY 16 g 11  . furosemide (LASIX) 40 MG tablet Take 1 tablet (40 mg total) by mouth daily. 90 tablet 3  . gabapentin (NEURONTIN) 600 MG tablet Take 1,200 mg by mouth 3 (three) times daily.    . hydrOXYzine (VISTARIL) 25 MG capsule TAKE 1 CAPSULE EVERY EIGHT HOURS AS NEEDED FOR ANXIETY 90 capsule 1  . ipratropium (ATROVENT) 0.06 % nasal spray PLACE 2 SPRAYS INTO BOTH NOSTRILS 3 TIMES DAILY AS NEEDED 15 mL 12  . letrozole (FEMARA) 2.5 MG tablet TAKE ONE TABLET BY MOUTH EVERY DAY 90 tablet 0  . metoprolol succinate (TOPROL-XL) 25 MG 24 hr tablet Take 1 tablet (25 mg total) by mouth daily. 90 tablet 3  . montelukast (SINGULAIR) 10 MG tablet Take 1 tablet (10 mg total) by mouth at bedtime. 90 tablet 3  . Multiple Vitamins-Minerals (MULTIVITAMIN) tablet Take 1 tablet by mouth daily. 90 tablet 3  . oxymorphone (OPANA) 10  MG tablet Take 10 mg by mouth 2 (two) times daily.    . SENEXON-S 8.6-50 MG tablet TAKE 1-2 TABLETS BY MOUTH DAILY AS NEEDED FOR MILD CONSTIPATION. 180 tablet 1  . telmisartan (MICARDIS) 80 MG tablet Take 1 tablet (80 mg total) by mouth daily. 90 tablet 3  . Tiotropium Bromide-Olodaterol (STIOLTO  RESPIMAT) 2.5-2.5 MCG/ACT AERS Inhale 2 puffs into the lungs daily. 4 g 11   No current facility-administered medications for this visit.     Musculoskeletal: Strength & Muscle Tone: UTA Gait & Station: UTA Patient leans: N/A  Psychiatric Specialty Exam: Review of Systems  Psychiatric/Behavioral: Positive for decreased concentration, dysphoric mood and sleep disturbance. The patient is nervous/anxious.   All other systems reviewed and are negative.   There were no vitals taken for this visit.There is no height or weight on file to calculate BMI.  General Appearance: Casual  Eye Contact:  Fair  Speech:  Clear and Coherent  Volume:  Normal  Mood:  Anxious and Dysphoric  Affect:  Congruent  Thought Process:  Goal Directed and Descriptions of Associations: Intact  Orientation:  Full (Time, Place, and Person)  Thought Content: Logical   Suicidal Thoughts:  No  Homicidal Thoughts:  No  Memory:  Immediate;   Fair Recent;   Fair Remote;   Fair  Judgement:  Fair  Insight:  Fair  Psychomotor Activity:  Normal  Concentration:  Concentration: Fair and Attention Span: Fair  Recall:  [employer] of Knowledge: Fair  Language: Fair  Akathisia:  No  Handed:  Right  AIMS (if indicated): UTA  Assets:  Communication Skills Desire for Improvement Housing Social Support  ADL's:  Intact  Cognition: WNL  Sleep:  Excessive   Screenings: GAD-7   Flowsheet Row Office Visit from 06/12/2019 in [facility] Visit from 09/30/2017 in [facility]  Total GAD-7 Score 18 18    PHQ2-9   [facility] Video Visit from 05/09/2020 in [facility] from 04/12/2020 in [facility] Video Visit from 04/11/2020 in [facility] Visit from 06/12/2019 in [facility] Office Visit from 12/31/2018 in [facility]  PHQ-2 Total Score 1 2 2 4  0   PHQ-9 Total Score 12 13 13 17  --    Flowsheet Row Video Visit from 05/09/2020 in [facility] Counselor from 04/12/2020 in [facility] Video Visit from 04/11/2020 in [facility] No Risk No Risk No Risk       Assessment and Plan: Ashley Cross is a 65 year old Caucasian female, divorced, disabled, has a history of depression, chronic pain, hypertension, history of breast cancer was evaluated by telemedicine today.  Patient is biologically predisposed given her family history of substance abuse problems.  Patient with psychosocial stressors of health issues, financial problems, death of her daughter.  Patient is currently struggling with depressive symptoms, will benefit from the following plan.  Plan MDD-unstable Will refer for [place].  I have sent communication to [facility], [place]. Cymbalta was p.o. twice daily-recently increased Reduce Wellbutrin XL to 300 mg p.o. daily Discontinue Topamax for noncompliance and side effects. Hydroxyzine 25 mg p.o. daily as needed for anxiety attacks and sleep. PHQ 9 today equals 12  Alcohol use disorder mild in partial remission Patient reports she is currently cutting back. We will monitor closely.  She will continue to benefit from counseling.  Tobacco  use disorder-improving Provided counseling  Follow-up in clinic in 3 weeks or sooner if needed.    This note was generated in part or whole with voice recognition software. Voice recognition is usually quite accurate but there are transcription errors that can and very often do occur. I apologize for any typographical errors that were not detected and corrected.          Ursula Alert, MD 05/09/2020, 1:01 PM

## 2020-05-09 NOTE — Telephone Encounter (Signed)
Called pt to follow up on a [facility] referral. Pt did not answer the phone. A message was left on the voicemail with a call back number.

## 2020-05-09 NOTE — Telephone Encounter (Signed)
Pt returned call to discuss [place] therapy. Pt was provided information regarding what [place] therapy is, length of treatment, and expectations. Pt is unsure if she will be able to do [place] at this time. 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 Pt was given contact information if she decides to continue

## 2020-05-09 NOTE — Patient Instructions (Signed)
Lamotrigine tablets What is this medicine? LAMOTRIGINE (la MOE Hendricks Limes) is used to control seizures in adults and children with epilepsy and Lennox-Gastaut syndrome. It is also used in adults to treat bipolar disorder. This medicine may be used for other purposes; ask your health care provider or pharmacist if you have questions. COMMON BRAND NAME(S): Lamictal, Subvenite What should I tell my health care provider before I take this medicine? They need to know if you have any of these conditions:  heart disease  history of irregular heartbeat  immune system problems  kidney disease  liver disease  low levels of folic acid in the blood  lupus  mental illness  suicidal thoughts, plans, or attempt; a previous suicide attempt by you or a family member  an unusual or allergic reaction to lamotrigine or other seizure medications, other medicines, foods, dyes, or preservatives  pregnant or trying to get pregnant  breast-feeding How should I use this medicine? Take this medicine by mouth with a glass of water. Follow the directions on the prescription label. Do not chew these tablets. If this medicine upsets your stomach, take it with food or milk. Take your doses at regular intervals. Do not take your medicine more often than directed. A special MedGuide will be given to you by the pharmacist with each new prescription and refill. Be sure to read this information carefully each time. Talk to your pediatrician regarding the use of this medicine in children. While this drug may be prescribed for children as young as 2 years for selected conditions, precautions do apply. Overdosage: If you think you have taken too much of this medicine contact a poison control center or emergency room at once. NOTE: This medicine is only for you. Do not share this medicine with others. What if I miss a dose? If you miss a dose, take it as soon as you can. If it is almost time for your next dose, take only  that dose. Do not take double or extra doses. What may interact with this medicine?  atazanavir  birth control pills  certain medicines for irregular heartbeat  certain medicines for seizures like carbamazepine, phenobarbital, phenytoin, primidone, valproic acid  lopinavir  rifampin  ritonavir This list may not describe all possible interactions. Give your health care provider a list of all the medicines, herbs, non-prescription drugs, or dietary supplements you use. Also tell them if you smoke, drink alcohol, or use illegal drugs. Some items may interact with your medicine. What should I watch for while using this medicine? Visit your doctor or health care providerfor regular checks on your progress. If you take this medicine for seizures, wear a Medic Alert bracelet or necklace. Carry an identification card with information about your condition, medicines, and doctor or health care provider. It is important to take this medicine exactly as directed. When first starting treatment, your dose will need to be adjusted slowly. It may take weeks or months before your dose is stable. You should contact your doctor or health care providerif your seizures get worse or if you have any new types of seizures. Do not stop taking this medicine unless instructed by your doctor or health care provider. Stopping your medicine suddenly can increase your seizures or their severity. This medicine may cause serious skin reactions. They can happen weeks to months after starting the medicine. Contact your health care provider right away if you notice fevers or flu-like symptoms with a rash. The rash may be red or purple  and then turn into blisters or peeling of the skin. Or, you might notice a red rash with swelling of the face, lips or lymph nodes in your neck or under your arms. You may get drowsy, dizzy, or have blurred vision. Do not drive, use machinery, or do anything that needs mental alertness until you know  how this medicine affects you. To reduce dizzy or fainting spells, do not sit or stand up quickly, especially if you are an older patient. Alcohol can increase drowsiness and dizziness. Avoid alcoholic drinks. If you are taking this medicine for bipolar disorder, it is important to report any changes in your mood to your doctor or health care provider. If your condition gets worse, you get mentally depressed, feel very hyperactive or manic, have difficulty sleeping, or have thoughts of hurting yourself or committing suicide, you need to get help from your health care providerright away. If you are a caregiver for someone taking this medicine for bipolar disorder, you should also report these behavioral changes right away. The use of this medicine may increase the chance of suicidal thoughts or actions. Pay special attention to how you are responding while on this medicine. Your mouth may get dry. Chewing sugarless gum or sucking hard candy, and drinking plenty of water may help. Contact your doctor if the problem does not go away or is severe. Women who become pregnant while using this medicine may enroll in the [place] Pregnancy Registry by calling [phone]. This registry collects information about the safety of antiepileptic drug use during pregnancy. This medicine may cause a decrease in folic acid. You should make sure that you get enough folic acid while you are taking this medicine. Discuss the foods you eat and the vitamins you take with your health care provider. What side effects may I notice from receiving this medicine? Side effects that you should report to your doctor or health care professional as soon as possible:  allergic reactions (skin rash, itching or hives; swelling of the face, lips, or tongue)  aseptic meningitis (stiff neck; sensitivity to light; headache; drowsiness; fever; nausea, vomiting; rash)  changes in vision  heartbeat rhythm changes  (trouble breathing; chest pain; dizziness; fast, irregular heartbeat; feeling faint or lightheaded, falls)  infection (fever, chills, cough, sore throat, pain or trouble passing urine)  liver injury (dark yellow or brown urine; general ill feeling or flu-like symptoms; loss of appetite, right upper belly pain; unusually weak or tired, yellowing of the eyes or skin)  low red blood cell counts (trouble breathing; feeling faint; lightheaded, falls; unusually weak or tired)  rash, fever, and swollen lymph nodes  redness, blistering, peeling, or loosening of the skin, including inside the mouth  seizures  suicidal thoughts, mood changes  unusual bruising or bleeding Side effects that usually do not require medical attention (report to your doctor or health care professional if they continue or are bothersome):  diarrhea  dizziness  nausea  stomach pain  tremors  vomiting This list may not describe all possible side effects. Call your doctor for medical advice about side effects. You may report side effects to FDA at 1-800-FDA-1088. Where should I keep my medicine? Keep out of the reach of children and pets. Store at [employer] C (77 degrees F). Protect from light. Get rid of any unused medicine after the expiration date. To get rid of medicines that are no longer needed or have expired:  Take the medicine to a medicine take-back program. Check  with your pharmacy or law enforcement to find a location.  If you cannot return the medicine, check the label or package insert to see if the medicine should be thrown out in the garbage or flushed down the toilet. If you are not sure, ask your health care provider. If it is safe to put it in the trash, empty the medicine out of the container. Mix the medicine with cat litter, dirt, coffee grounds, or other unwanted substance. Seal the mixture in a bag or container. Put it in the trash. NOTE: This sheet is a summary. It may not cover all  possible information. If you have questions about this medicine, talk to your doctor, pharmacist, or health care provider.  2021 Elsevier/Gold Standard (2019-05-07 10:26:40)

## 2020-05-10 ENCOUNTER — Other Ambulatory Visit: Payer: Self-pay | Admitting: Psychiatry

## 2020-05-10 ENCOUNTER — Ambulatory Visit (INDEPENDENT_AMBULATORY_CARE_PROVIDER_SITE_OTHER): Payer: PPO | Admitting: Licensed Clinical Social Worker

## 2020-05-10 DIAGNOSIS — F339 Major depressive disorder, recurrent, unspecified: Secondary | ICD-10-CM | POA: Diagnosis not present

## 2020-05-10 NOTE — Progress Notes (Signed)
Virtual Visit via Video Note  I connected with Ashley Cross on 05/10/20 at  9:00 AM EDT by a video enabled telemedicine application and verified that I am speaking with the correct person using two identifiers.  Location: Patient: home  Provider: remote office [place], [place])   I discussed the limitations of evaluation and management by telemedicine and the availability of in person appointments. The patient expressed understanding and agreed to proceed.   I discussed the assessment and treatment plan with the patient. The patient was provided an opportunity to ask questions and all were answered. The patient agreed with the plan and demonstrated an understanding of the instructions.   The patient was advised to call back or seek an in-person evaluation if the symptoms worsen or if the condition fails to improve as anticipated.  I provided 56 minutes of non-face-to-face time during this encounter.   Garlan Drewes R Odell Choung, LCSW    THERAPIST PROGRESS NOTE  Session Time: 9-9:56a  Participation Level: Active  Behavioral Response: Neat and Well GroomedAlertAnxious and Depressed  Type of Therapy: Individual Therapy  Treatment Goals addressed: Anxiety and Coping  Interventions: CBT  Summary: Ashley Cross is a 65 y.o. female who presents with symptoms associated with depression, anxiety. Pt reports that mood has been fairly stable but has had days where she feels more down and depressed. Discussed [place] (pt discussed with psychiatrist). Discussed medication trials in the past and discussed pt engaging in CBT-based activities. Pt admits that she has not put forth a lot of effort in CBT based activities that could help with mood such as physical activity and mindfulness. Pt is making an effort to engage in more tasks needing work around the house. Discussed setting limits and "5 minute rule" to help with motivation/initiative. Pt reports that she has felt overly emotional after  a medication change, so pt made decision to come off meds--pt discussed with her psychiatrist already.   Allowed pt to explore other stressors--pt discussed not going into daughter's room. Pt did get emotional when discussing daughter.   Reviewed coping skills and ways to get out and not isolate. Pt is still working as a caregiver to "Ms Venida Jarvis" and is enjoying her time there.   Pt having some stress over disability review paperwork that is due in 15 days.  Continued recommendations are as follows: self care behaviors, positive social engagements, focusing on overall work/home/life balance, and focusing on positive physical and emotional wellness.   Suicidal/Homicidal: No  Therapist Response: Ashley Cross is continuing to develop coping skills to help with anxiety and mood stability. Ashley Cross has noted that when she is more isolated her depression symptoms increase, so pt is using that information to develop interventions to prevent future depressive episodes using social engagement. Ashley Cross is able to identify and assess current and past mood episodes including their features, frequency, intensity, and duration.These behaviors are reflective of personal growth and progress. Treatment to continue.  Plan: Return again in 4 weeks.  Diagnosis: Axis I: Major Depression, Recurrent severe    Axis II: No diagnosis    [place], LCSW 05/10/2020

## 2020-05-14 ENCOUNTER — Ambulatory Visit
Admission: RE | Admit: 2020-05-14 | Discharge: 2020-05-14 | Disposition: A | Discharge status: Home / Self Care | Payer: PPO | Source: Ambulatory Visit | Attending: Internal Medicine | Admitting: Internal Medicine

## 2020-05-14 ENCOUNTER — Other Ambulatory Visit: Payer: Self-pay

## 2020-05-14 DIAGNOSIS — M8589 Other specified disorders of bone density and structure, multiple sites: Secondary | ICD-10-CM | POA: Diagnosis not present

## 2020-05-14 DIAGNOSIS — E2839 Other primary ovarian failure: Secondary | ICD-10-CM

## 2020-05-14 DIAGNOSIS — Z780 Asymptomatic menopausal state: Secondary | ICD-10-CM | POA: Diagnosis not present

## 2020-05-14 DIAGNOSIS — M8580 Other specified disorders of bone density and structure, unspecified site: Secondary | ICD-10-CM

## 2020-05-17 ENCOUNTER — Encounter: Payer: Self-pay | Admitting: Internal Medicine

## 2020-05-18 ENCOUNTER — Other Ambulatory Visit: Payer: Self-pay

## 2020-05-18 ENCOUNTER — Ambulatory Visit
Admission: RE | Admit: 2020-05-18 | Discharge: 2020-05-18 | Disposition: A | Discharge status: Home / Self Care | Payer: Medicare Other | Source: Ambulatory Visit | Attending: General Surgery | Admitting: General Surgery

## 2020-05-18 ENCOUNTER — Ambulatory Visit
Admission: RE | Admit: 2020-05-18 | Discharge: 2020-05-18 | Disposition: A | Discharge status: Home / Self Care | Payer: PPO | Source: Ambulatory Visit | Attending: General Surgery | Admitting: General Surgery

## 2020-05-18 DIAGNOSIS — N6489 Other specified disorders of breast: Secondary | ICD-10-CM | POA: Diagnosis not present

## 2020-05-18 DIAGNOSIS — R922 Inconclusive mammogram: Secondary | ICD-10-CM | POA: Diagnosis not present

## 2020-05-18 DIAGNOSIS — R2232 Localized swelling, mass and lump, left upper limb: Secondary | ICD-10-CM

## 2020-05-18 DIAGNOSIS — Z853 Personal history of malignant neoplasm of breast: Secondary | ICD-10-CM | POA: Diagnosis not present

## 2020-05-19 ENCOUNTER — Inpatient Hospital Stay: Admission: RE | Admit: 2020-05-19 | Payer: PPO | Source: Ambulatory Visit

## 2020-05-24 DIAGNOSIS — G894 Chronic pain syndrome: Secondary | ICD-10-CM | POA: Diagnosis not present

## 2020-05-24 DIAGNOSIS — M47812 Spondylosis without myelopathy or radiculopathy, cervical region: Secondary | ICD-10-CM | POA: Diagnosis not present

## 2020-05-24 DIAGNOSIS — M961 Postlaminectomy syndrome, not elsewhere classified: Secondary | ICD-10-CM | POA: Diagnosis not present

## 2020-05-24 DIAGNOSIS — M5416 Radiculopathy, lumbar region: Secondary | ICD-10-CM | POA: Diagnosis not present

## 2020-05-26 ENCOUNTER — Other Ambulatory Visit: Payer: Self-pay | Admitting: Psychiatry

## 2020-05-26 DIAGNOSIS — F1010 Alcohol abuse, uncomplicated: Secondary | ICD-10-CM

## 2020-05-26 DIAGNOSIS — F331 Major depressive disorder, recurrent, moderate: Secondary | ICD-10-CM

## 2020-05-27 ENCOUNTER — Other Ambulatory Visit: Payer: Self-pay | Admitting: Psychiatry

## 2020-05-27 ENCOUNTER — Telehealth: Payer: Self-pay | Admitting: Internal Medicine

## 2020-05-27 DIAGNOSIS — F331 Major depressive disorder, recurrent, moderate: Secondary | ICD-10-CM

## 2020-05-27 NOTE — Telephone Encounter (Signed)
lft pt vm to call ofc to follow up on referral. thanks

## 2020-06-01 ENCOUNTER — Other Ambulatory Visit: Payer: Self-pay | Admitting: Psychiatry

## 2020-06-01 DIAGNOSIS — F339 Major depressive disorder, recurrent, unspecified: Secondary | ICD-10-CM

## 2020-06-02 ENCOUNTER — Other Ambulatory Visit: Payer: Self-pay

## 2020-06-02 ENCOUNTER — Encounter: Payer: Self-pay | Admitting: Psychiatry

## 2020-06-02 ENCOUNTER — Telehealth (INDEPENDENT_AMBULATORY_CARE_PROVIDER_SITE_OTHER): Payer: PPO | Admitting: Psychiatry

## 2020-06-02 DIAGNOSIS — F17200 Nicotine dependence, unspecified, uncomplicated: Secondary | ICD-10-CM

## 2020-06-02 DIAGNOSIS — F3341 Major depressive disorder, recurrent, in partial remission: Secondary | ICD-10-CM

## 2020-06-02 DIAGNOSIS — F1010 Alcohol abuse, uncomplicated: Secondary | ICD-10-CM

## 2020-06-02 MED ORDER — DULOXETINE HCL 60 MG PO CPEP: 60.0000 mg | ORAL_CAPSULE | Freq: Two times a day (BID) | ORAL | 1 refills | Status: DC

## 2020-06-02 NOTE — Progress Notes (Signed)
Virtual Visit via Video Note  I connected with Ashley Cross on 06/02/20 at  3:00 PM EDT by a video enabled telemedicine application and verified that I am speaking with the correct person using two identifiers.  Location Provider Location : [facility] Patient Location : Home  Participants: Patient , Provider   I discussed the limitations of evaluation and management by telemedicine and the availability of in person appointments. The patient expressed understanding and agreed to proceed.   I discussed the assessment and treatment plan with the patient. The patient was provided an opportunity to ask questions and all were answered. The patient agreed with the plan and demonstrated an understanding of the instructions.   The patient was advised to call back or seek an in-person evaluation if the symptoms worsen or if the condition fails to improve as anticipated.   [place] MD OP Progress Note  06/02/2020 3:34 PM Tawonda Triana  MRN:  [id number]  Chief Complaint:  Chief Complaint    Follow-up; Depression; Anxiety     HPI: Ashley Cross is a 65 year old Caucasian female on disability, lives in [place], has a history of MDD, history of breast cancer, chronic pain, hypertension, was evaluated by telemedicine today.  Patient today reports she is currently making some progress with regards to her depressive symptoms.  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  This is a good distraction technique for her.  She reports her sadness has improved.  She reports her lack of motivation and anhedonia is improving.  She reports the Lamictal is helpful.  Denies side effects.  Reports sleep is overall okay.  She denies suicidality, homicidality or perceptual disturbances.  She does struggle with pain and agrees to follow up with her providers on the same.  She also has nausea today, agrees to get in touch with primary  care provider if it does not resolve or get worse.  She reports she does not know where it is coming from.  She reports she is cutting back on alcohol intake and currently drinks 2 drinks a couple of times a week usually at bedtime.  She drinks 2 glasses of wine.  Patient continues to follow-up with her therapist and reports therapy sessions are beneficial.  She is not interested in [place] at this time since she feels she is making progress on the Lamictal and also she did not want to drive all the way to [place].  Patient denies any other concerns today.  Visit Diagnosis:    ICD-10-CM   1. MDD (major depressive disorder), recurrent, in partial remission (HCC)  F33.41 DULoxetine (CYMBALTA) 60 MG capsule  2. Alcohol use disorder, mild, abuse  F10.10   3. Tobacco use disorder  F17.200     Past Psychiatric History: I have reviewed past psychiatric history from progress note on 09/16/2018.  Past trials of Lexapro, Zoloft, Wellbutrin, Celexa  Past Medical History:  Past Medical History:  Diagnosis Date  . Anxiety   . Breast cancer ([place])    Left breast(nipple mass) 2018  . Cervical stenosis of spine   . Chicken pox   . Chronic back pain   . Colon polyps   . COPD (chronic obstructive pulmonary disease) ([place])   . Cyst of left nipple   . Depression   . Hypertension   . Insomnia   . Personal history of radiation therapy     Past Surgical History:  Procedure Laterality  Date  . BACK SURGERY     x 3 (2008/05/30 x 2; 2011/05/31 x 1)   . BREAST BIOPSY Left    core- neg  . BREAST LUMPECTOMY Left 2016/05/30  . BREAST SURGERY Left    breast biopsy  . CERVICAL CONE BIOPSY    . CERVICAL FUSION     times 2  . COLONOSCOPY WITH PROPOFOL N/A 04/18/2015   Procedure: COLONOSCOPY WITH PROPOFOL;  Surgeon: [name], MD;  Location: [facility] ENDOSCOPY;  Service: Endoscopy;  Laterality: N/A;  . ELBOW SURGERY     May 30, 2012 b/l elbows per pt ulnar nerve   . ELBOW SURGERY     x2   . EPIDURAL BLOCK INJECTION      Dr. Maryjean Ka   . MASS EXCISION Left 04/12/2016   Procedure: EXCISION OF LEFT NIPPLE MASS;  Surgeon: Stark Klein, MD;  Location: [facility];  Service: General;  Laterality: Left;  . NECK SURGERY    . POSTERIOR CERVICAL FUSION/FORAMINOTOMY  03/07/2011   Procedure: POSTERIOR CERVICAL FUSION/FORAMINOTOMY LEVEL 4;  Surgeon: Eustace Moore, MD;  Location: [facility]URO ORS;  Service: Neurosurgery;  Laterality: N/A;  cervical three to thoracic one posterior cervical fusion   . SENTINEL NODE BIOPSY Left 05/29/2016   Procedure: LEFT SENTINEL LYMPH NODE BIOPSY;  Surgeon: Stark Klein, MD;  Location: [facility];  Service: General;  Laterality: Left;  . TONSILLECTOMY      Family Psychiatric History: I have reviewed family psychiatric history from progress note on 09/16/2018  Family History:  Family History  Problem Relation Age of Onset  . Breast cancer Mother 11  . Hypertension Mother   . Cancer Mother        breast dx'ed 24   . Cancer Father        prostate  . Hypertension Father   . Drug abuse Daughter   . Hypertension Maternal Grandmother   . Breast cancer Cousin     Social History: Reviewed social history from progress note on 09/16/2018 Social History   Socioeconomic History  . Marital status: Divorced    Spouse name: Not on file  . Number of children: 2  . Years of education: Not on file  . Highest education level: Not on file  Occupational History  . Not on file  Tobacco Use  . Smoking status: Current Every Day Smoker    Packs/day: 0.25    Years: 30.00    Pack years: 7.50    Types: Cigarettes  . Smokeless tobacco: Never Used  . Tobacco comment: work days - 6 cigarettes; weekends - ~10   Vaping Use  . Vaping Use: Every day  Substance and Sexual Activity  . Alcohol use: Yes    Comment: social  . Drug use: No    Comment: on opana since jan 2018  . Sexual activity: Not on file  Other Topics Concern  . Not on file  Social History Narrative   As of 01/25/17 not sexually  active of in relationship    Divorced   2 kids son and daughter (though daughter died of suicide in 30-May-2017)   Son lives in [place] now as of 12/2018       Disability    Loves animals has 3 dogs           Social Determinants of Radio broadcast assistant Strain: Low Risk   . Difficulty of Paying Living Expenses: Not hard at all  Food Insecurity: No Food Insecurity  .  Worried About Charity fundraiser in the Last Year: Never true  . Ran Out of Food in the Last Year: Never true  Transportation Needs: No Transportation Needs  . Lack of Transportation (Medical): No  . Lack of Transportation (Non-Medical): No  Physical Activity: Not on file  Stress: Stress Concern Present  . Feeling of Stress : Rather much  Social Connections: Unknown  . Frequency of Communication with Friends and Family: More than three times a week  . Frequency of Social Gatherings with Friends and Family: Not on file  . Attends Religious Services: Not on file  . Active Member of Clubs or Organizations: Not on file  . Attends Archivist Meetings: Not on file  . Marital Status: Not on file    Allergies:  Allergies  Allergen Reactions  . Anastrozole   . Augmentin [Amoxicillin-Pot Clavulanate]     Diarrhea   . Norvasc [Amlodipine]     Swelling   . Topamax [Topiramate]     Didn't like the way made feel more irritatble/sad   . Sulfa Antibiotics Itching and Rash  . Sulfonamide Derivatives Itching and Rash    Metabolic Disorder Labs: Lab Results  Component Value Date   HGBA1C 5.4 04/28/2020   No results found for: PROLACTIN Lab Results  Component Value Date   CHOL 186 04/28/2020   TRIG 73.0 04/28/2020   HDL 95.00 04/28/2020   CHOLHDL 2 04/28/2020   VLDL 14.6 04/28/2020   LDLCALC 76 04/28/2020   LDLCALC 85 08/25/2019   Lab Results  Component Value Date   TSH 0.71 08/25/2019   TSH 2.91 04/03/2019    Therapeutic Level Labs: No results found for: LITHIUM No results found for:  VALPROATE No components found for:  CBMZ  Current Medications: Current Outpatient Medications  Medication Sig Dispense Refill  . albuterol (VENTOLIN HFA) 108 (90 Base) MCG/ACT inhaler Inhale 1-2 puffs into the lungs every 4 (four) hours as needed for wheezing or shortness of breath. 18 g 12  . azelastine (ASTELIN) 0.1 % nasal spray     . buPROPion (WELLBUTRIN XL) 300 MG 24 hr tablet Take 1 tablet (300 mg total) by mouth daily. 90 tablet 1  . cyclobenzaprine (FLEXERIL) 10 MG tablet Take 10 mg by mouth every 8 (eight) hours as needed.    . DULoxetine (CYMBALTA) 60 MG capsule Take 1 capsule (60 mg total) by mouth 2 (two) times daily. 60 capsule 1  . fluticasone (FLONASE) 50 MCG/ACT nasal spray TAKE 2 SPRAYS INTO BOTH NOSTRILS DAILY 16 g 11  . furosemide (LASIX) 40 MG tablet Take 1 tablet (40 mg total) by mouth daily. 90 tablet 3  . gabapentin (NEURONTIN) 600 MG tablet Take 1,200 mg by mouth 3 (three) times daily.    . hydrOXYzine (VISTARIL) 25 MG capsule TAKE 1 CAPSULE EVERY EIGHT HOURS AS NEEDED FOR ANXIETY 90 capsule 1  . ipratropium (ATROVENT) 0.06 % nasal spray PLACE 2 SPRAYS INTO BOTH NOSTRILS 3 TIMES DAILY AS NEEDED 15 mL 12  . lamoTRIgine (LAMICTAL) 25 MG tablet TAKE 1 TABLET BY MOUTH ONCE DAILY 30 tablet 1  . letrozole (FEMARA) 2.5 MG tablet TAKE ONE TABLET BY MOUTH EVERY DAY 90 tablet 0  . metoprolol succinate (TOPROL-XL) 25 MG 24 hr tablet Take 1 tablet (25 mg total) by mouth daily. 90 tablet 3  . montelukast (SINGULAIR) 10 MG tablet Take 1 tablet (10 mg total) by mouth at bedtime. 90 tablet 3  . Multiple Vitamins-Minerals (MULTIVITAMIN) tablet Take 1  tablet by mouth daily. 90 tablet 3  . oxymorphone (OPANA) 10 MG tablet Take 10 mg by mouth 2 (two) times daily.    . SENEXON-S 8.6-50 MG tablet TAKE 1-2 TABLETS BY MOUTH DAILY AS NEEDED FOR MILD CONSTIPATION. 180 tablet 1  . telmisartan (MICARDIS) 80 MG tablet Take 1 tablet (80 mg total) by mouth daily. 90 tablet 3  . Tiotropium  Bromide-Olodaterol (STIOLTO RESPIMAT) 2.5-2.5 MCG/ACT AERS Inhale 2 puffs into the lungs daily. 4 g 11   No current facility-administered medications for this visit.     Musculoskeletal: Strength & Muscle Tone: UTA Gait & Station: UTA Patient leans: N/A  Psychiatric Specialty Exam: Review of Systems  Gastrointestinal: Positive for nausea.  Musculoskeletal: Positive for back pain.  Psychiatric/Behavioral: Positive for dysphoric mood (improving). The patient is nervous/anxious (improved).   All other systems reviewed and are negative.   There were no vitals taken for this visit.There is no height or weight on file to calculate BMI.  General Appearance: Casual  Eye Contact:  Fair  Speech:  Normal Rate  Volume:  Normal  Mood:  Anxious and Depressed  Affect:  Congruent  Thought Process:  Goal Directed and Descriptions of Associations: Intact  Orientation:  Full (Time, Place, and Person)  Thought Content: Logical   Suicidal Thoughts:  No  Homicidal Thoughts:  No  Memory:  Immediate;   Fair Recent;   Fair Remote;   Fair  Judgement:  Fair  Insight:  Fair  Psychomotor Activity:  Normal  Concentration:  Concentration: Fair and Attention Span: Fair  Recall:  [employer] of Knowledge: Fair  Language: Fair  Akathisia:  No  Handed:  Right  AIMS (if indicated): UTA  Assets:  Communication Skills Desire for Improvement Housing Social Support  ADL's:  Intact  Cognition: WNL  Sleep:  Fair   Screenings: GAD-7   Flowsheet Row Office Visit from 06/12/2019 in [facility] Office Visit from 09/30/2017 in [facility]  Total GAD-7 Score 18 18    PHQ2-9   [place] Video Visit from 06/02/2020 in [facility] Video Visit from 05/09/2020 in [facility] from 04/12/2020 in [facility] Video Visit from 04/11/2020 in [facility] Office  Visit from 06/12/2019 in [facility]  PHQ-2 Total Score 2 1 2 2 4   PHQ-9 Total Score 7 12 13 13 17     Flowsheet Row Counselor from 05/10/2020 in [facility] Video Visit from 05/09/2020 in [facility] Counselor from 04/12/2020 in [facility] No Risk No Risk No Risk       Assessment and Plan: Ashley Cross is a 65 year old Caucasian female, divorced, disabled, has a history of depression, chronic pain, hypertension, history of breast cancer was evaluated by telemedicine today.  Patient is biologically predisposed given family history of substance abuse problems.  Patient with psychosocial stressors of health issue, financial problems, death of daughter.  Patient is currently making some progress on the current medication regimen.  Plan as noted below.  Plan MDD in partial remission Cymbalta 60 mg p.o. twice daily Wellbutrin XL 300 mg p.o. daily-reduced dosage Lamictal 25 mg p.o. daily. Hydroxyzine 25 mg p.o. daily as needed for severe anxiety attacks,sleep Continue CBT  Alcohol use disorder mild in partial remission She continues to cut back.  Provided counseling.   Tobacco use disorder-improving Provided counseling  Provided information for  [place] with Adrienne Mocha in [place].  Follow-up in clinic in 4 weeks or sooner if needed.  This note was generated in part or whole with voice recognition software. Voice recognition is usually quite accurate but there are transcription errors that can and very often do occur. I apologize for any typographical errors that were not detected and corrected.       Ursula Alert, MD 06/03/2020, 8:56 AM

## 2020-06-03 ENCOUNTER — Telehealth: Payer: Self-pay | Admitting: Internal Medicine

## 2020-06-03 NOTE — Telephone Encounter (Signed)
Does she want a different provider?

## 2020-06-03 NOTE — Telephone Encounter (Signed)
I spoke with pt she did not go forth with the [facility] gastro due to 200.00 copay for the test.

## 2020-06-06 ENCOUNTER — Ambulatory Visit (INDEPENDENT_AMBULATORY_CARE_PROVIDER_SITE_OTHER): Payer: PPO | Admitting: Licensed Clinical Social Worker

## 2020-06-06 ENCOUNTER — Other Ambulatory Visit: Payer: Self-pay

## 2020-06-06 DIAGNOSIS — F3341 Major depressive disorder, recurrent, in partial remission: Secondary | ICD-10-CM

## 2020-06-06 NOTE — Progress Notes (Signed)
Virtual Visit via Video Note  I connected with Gao Mitnick on 06/06/20 at  9:00 AM EDT by a video enabled telemedicine application and verified that I am speaking with the correct person using two identifiers.  Location: Patient: home Provider: remote office [place], [place])   I discussed the limitations of evaluation and management by telemedicine and the availability of in person appointments. The patient expressed understanding and agreed to proceed.  I discussed the assessment and treatment plan with the patient. The patient was provided an opportunity to ask questions and all were answered. The patient agreed with the plan and demonstrated an understanding of the instructions.   The patient was advised to call back or seek an in-person evaluation if the symptoms worsen or if the condition fails to improve as anticipated.  I provided 60 minutes of non-face-to-face time during this encounter.   [place], LCSW   THERAPIST PROGRESS NOTE  Session Time: 9-10a  Participation Level: Active  Behavioral Response: NeatAlertAnxious  Type of Therapy: Individual Therapy  Treatment Goals addressed: Anxiety and Coping  Interventions: CBT and Other: grief counseling  Summary: Kensi Karr is a 65 y.o. female who presents with improving symptoms related to depression diagnosis. Pt reports that overall mood is stable, sleep quality and quantity is inconsistent, and that she is managing situational stressors better than in the past.  "I think everything has changed for the better since I started working with Ms. Sherri".  Allowed pt to explore relationship with Ms.Sherri (pt is her caregiver) and discuss overall psychological impact. Pt feels that she has more of a sense of purpose and doesn't feel as depressed as she has in the past.   Discussed medication changes and the possibility of [place].  "I don't want someone messing with my brain, and especially here in  [place]". Pt continues to be wary about TMS procedure and not happy about the time commitment.  Allowed pt to explore recent self-care activities: pt enjoys working in the yard. Pt states that she enjoys being home with her animals and working in the yard when she has time. Discussed importance of sitting and enjoying her work as a mindfulness activity.   Discussed possibility of son coming for a visit end of May. Pt very excited about this. Pt states that she wants to do better eating-wise and exercise-wise. Reviewed current levels and had pt set some loose goals for the future. Reviewed sleep hygiene. Encouraged pt to continue engaging with others socially.  Continued recommendations are as follows: self care behaviors, positive social engagements, focusing on overall work/home/life balance, and focusing on positive physical and emotional wellness.   Suicidal/Homicidal: No  Therapist Response: [place] is continuing to develop coping skills to help with anxiety and mood stability. Marvette has noted that when she is more isolated her depression symptoms increase, so pt is using that information to develop interventions to prevent future depressive episodes using social engagement. [place] is able to identify and assess current and past mood episodes including their features, frequency, intensity, and duration.These behaviors are reflective of personal growth and progress. Treatment to continue.  Plan: Return again in 3 weeks.  Diagnosis: Axis I: Major Depression, Recurrent severe    Axis II: No diagnosis    [place], LCSW 06/06/2020

## 2020-06-21 ENCOUNTER — Telehealth: Payer: Self-pay

## 2020-06-21 NOTE — Chronic Care Management (AMB) (Signed)
  Care Management   Note  06/21/2020 Name: Ashley Cross MRN: [id number] DOB: 02/25/1955  Ashley Cross is a 65 y.o. year old female who is a primary care patient of McLean-Scocuzza, Nino Glow, MD and is actively engaged with the care management team. I reached out to [name] by phone today to assist with re-scheduling a follow up visit with the Pharmacist  Follow up plan: Unsuccessful telephone outreach attempt made. A HIPAA compliant phone message was left for the patient providing contact information and requesting a return call.  The care management team will reach out to the patient again over the next 4 days.  If patient returns call to provider office, please advise to call [facility]  at [place], [place], [place], [place] [address] Direct Dial: [phone] [email or web address] Website: [email or web address]

## 2020-06-22 ENCOUNTER — Telehealth: Payer: PPO

## 2020-06-23 NOTE — Chronic Care Management (AMB) (Signed)
  Care Management   Note  06/23/2020 Name: Ashley Cross MRN: [id number] DOB: 1955-08-05  Ashley Cross is a 65 y.o. year old female who is a primary care patient of McLean-Scocuzza, Nino Glow, MD and is actively engaged with the care management team. I reached out to Ashley Cross by phone today to assist with re-scheduling a follow up visit with the Pharmacist  Follow up plan: Unsuccessful telephone outreach attempt made. A HIPAA compliant phone message was left for the patient providing contact information and requesting a return call.  The care management team will reach out to the patient again over the next 7 days.  If patient returns call to provider office, please advise to call [facility]  at [place], [address] [place], [place], [place] [address] Direct Dial: [phone] [email or web address] Website: [email or web address]

## 2020-06-28 DIAGNOSIS — M5416 Radiculopathy, lumbar region: Secondary | ICD-10-CM | POA: Diagnosis not present

## 2020-06-29 ENCOUNTER — Telehealth: Payer: Self-pay

## 2020-06-29 ENCOUNTER — Other Ambulatory Visit: Payer: Self-pay

## 2020-06-29 ENCOUNTER — Telehealth (INDEPENDENT_AMBULATORY_CARE_PROVIDER_SITE_OTHER): Payer: PPO | Admitting: Psychiatry

## 2020-06-29 ENCOUNTER — Encounter: Payer: Self-pay | Admitting: Psychiatry

## 2020-06-29 DIAGNOSIS — F3342 Major depressive disorder, recurrent, in full remission: Secondary | ICD-10-CM | POA: Diagnosis not present

## 2020-06-29 DIAGNOSIS — F1010 Alcohol abuse, uncomplicated: Secondary | ICD-10-CM

## 2020-06-29 DIAGNOSIS — F17200 Nicotine dependence, unspecified, uncomplicated: Secondary | ICD-10-CM | POA: Diagnosis not present

## 2020-06-29 NOTE — [place] Notes (Signed)
Virtual Visit via Video Note  I connected with Ashley Cross on 06/29/20 at  9:30 AM EDT by a video enabled telemedicine application and verified that I am speaking with the correct person using two identifiers.  Location Provider Location : [facility] Patient Location : Home  Participants: Patient , Provider    I discussed the limitations of evaluation and management by telemedicine and the availability of in person appointments. The patient expressed understanding and agreed to proceed.   I discussed the assessment and treatment plan with the patient. The patient was provided an opportunity to ask questions and all were answered. The patient agreed with the plan and demonstrated an understanding of the instructions.   The patient was advised to call back or seek an in-person evaluation if the symptoms worsen or if the condition fails to improve as anticipated.   [facility] MD OP [place] Note  06/29/2020 3:54 PM Shali Vesey  MRN:  [id number]  Chief Complaint:  Chief Complaint    Follow-up; Anxiety; Depression     HPI: Ashley Cross is a 65 year old Caucasian female on disability, lives in [place], has a history of MDD, history of breast cancer, chronic pain, hypertension was evaluated by telemedicine today.   Patient today reports she is currently doing well.  She does not have any sadness.  She denies having any crying spells or low mood the past 2 weeks or more.  She reports energy continues to be low on and off however she is able to manage her work and other household activities okay.  She reports sleep is overall okay.  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  She reports she has also cut back on smoking since she does not want to smoke at work.  She is using alcohol rarely 1-2 times a week one drink per day.  Patient reports she looks forward to her son who is coming to visit her for a week tomorrow.  She  is excited about that.  She reports he works as a Music therapist for [employer].  She is planning to spend a lot of time with him, cook meals for him and so on.  She is compliant on medications as prescribed.  She denies any side effects.  Patient denies any other concerns today.  Visit Diagnosis:    ICD-10-CM   1. MDD (major depressive disorder), recurrent, in full remission ([place])  F33.42   2. Alcohol use disorder, mild, abuse  F10.10    partial remission  3. Tobacco use disorder  F17.200     Past Psychiatric History: I have reviewed past psychiatric history from [place] note on 09/16/2018.  Past trials of Lexapro, Zoloft, Wellbutrin, Celexa  Past Medical History:  Past Medical History:  Diagnosis Date  . Anxiety   . Breast cancer ([place])    Left breast(nipple mass) 2018  . Cervical stenosis of spine   . Chicken pox   . Chronic back pain   . Colon polyps   . COPD (chronic obstructive pulmonary disease) ([place])   . Cyst of left nipple   . Depression   . Hypertension   . Insomnia   . Personal history of radiation therapy     Past Surgical History:  Procedure Laterality Date  . BACK SURGERY     x 3 (2010 x 2; 2013 x 1)   . BREAST BIOPSY Left    core- neg  . BREAST  LUMPECTOMY Left 2016-05-21  . BREAST SURGERY Left    breast biopsy  . CERVICAL CONE BIOPSY    . CERVICAL FUSION     times 2  . COLONOSCOPY WITH PROPOFOL N/A 04/18/2015   Procedure: COLONOSCOPY WITH PROPOFOL;  Surgeon: [name], MD;  Location[facility] ENDOSCOPY;  Service: Endoscopy;  Laterality: N/A;  . ELBOW SURGERY     05/21/12 b/l elbows per pt ulnar nerve   . ELBOW SURGERY     x2   . EPIDURAL BLOCK INJECTION     Dr. Maryjean Ka   . MASS EXCISION Left 04/12/2016   Procedure: EXCISION OF LEFT NIPPLE MASS;  Surgeon: Stark Klein, MD;  Location: [facility];  Service: General;  Laterality: Left;  . NECK SURGERY    . POSTERIOR CERVICAL FUSION/FORAMINOTOMY  03/07/2011   Procedure: POSTERIOR CERVICAL  FUSION/FORAMINOTOMY LEVEL 4;  Surgeon: Eustace Moore, MD;  Location: [facility]URO ORS;  Service: Neurosurgery;  Laterality: N/A;  cervical three to thoracic one posterior cervical fusion   . SENTINEL NODE BIOPSY Left 05/29/2016   Procedure: LEFT SENTINEL LYMPH NODE BIOPSY;  Surgeon: Stark Klein, MD;  Location: [facility];  Service: General;  Laterality: Left;  . TONSILLECTOMY      Family Psychiatric History: I have reviewed family psychiatric history from [place] note on  09/16/2018  Family History:  Family History  Problem Relation Age of Onset  . Breast cancer Mother 70  . Hypertension Mother   . Cancer Mother        breast dx'ed 32   . Cancer Father        prostate  . Hypertension Father   . Drug abuse Daughter   . Hypertension Maternal Grandmother   . Breast cancer Cousin     Social History: I have reviewed social history from [place] note on 09/16/2018 Social History   Socioeconomic History  . Marital status: Divorced    Spouse name: Not on file  . Number of children: 2  . Years of education: Not on file  . Highest education level: Not on file  Occupational History  . Not on file  Tobacco Use  . Smoking status: Current Every Day Smoker    Packs/day: 0.25    Years: 30.00    Pack years: 7.50    Types: Cigarettes  . Smokeless tobacco: Never Used  . Tobacco comment: work days - 8 cigarettes; weekends - ~10   Vaping Use  . Vaping Use: Every day  Substance and Sexual Activity  . Alcohol use: Yes    Comment: social  . Drug use: No    Comment: on opana since jan 2018  . Sexual activity: Not on file  Other Topics Concern  . Not on file  Social History Narrative   As of 01/25/17 not sexually active of in relationship    Divorced   2 kids son and daughter (though daughter died of suicide in 21-May-2017)   Son lives in [place] now as of 12/2018       Disability    Loves animals has 3 dogs           Social Determinants of Radio broadcast assistant Strain: Low Risk   .  Difficulty of Paying Living Expenses: Not hard at all  Food Insecurity: No Food Insecurity  . Worried About Charity fundraiser in the Last Year: Never true  . Ran Out of Food in the Last Year: Never true  Transportation Needs:  No Transportation Needs  . Lack of Transportation (Medical): No  . Lack of Transportation (Non-Medical): No  Physical Activity: Not on file  Stress: Stress Concern Present  . Feeling of Stress : Rather much  Social Connections: Unknown  . Frequency of Communication with Friends and Family: More than three times a week  . Frequency of Social Gatherings with Friends and Family: Not on file  . Attends Religious Services: Not on file  . Active Member of Clubs or Organizations: Not on file  . Attends Archivist Meetings: Not on file  . Marital Status: Not on file    Allergies:  Allergies  Allergen Reactions  . Anastrozole   . Augmentin [Amoxicillin-Pot Clavulanate]     Diarrhea   . Norvasc [Amlodipine]     Swelling   . Topamax [Topiramate]     Didn't like the way made feel more irritatble/sad   . Sulfa Antibiotics Itching and Rash  . Sulfonamide Derivatives Itching and Rash    Metabolic Disorder Labs: Lab Results  Component Value Date   HGBA1C 5.4 04/28/2020   No results found for: PROLACTIN Lab Results  Component Value Date   CHOL 186 04/28/2020   TRIG 73.0 04/28/2020   HDL 95.00 04/28/2020   CHOLHDL 2 04/28/2020   VLDL 14.6 04/28/2020   LDLCALC 76 04/28/2020   LDLCALC 85 08/25/2019   Lab Results  Component Value Date   TSH 0.71 08/25/2019   TSH 2.91 04/03/2019    Therapeutic Level Labs: No results found for: LITHIUM No results found for: VALPROATE No components found for:  CBMZ  Current Medications: Current Outpatient Medications  Medication Sig Dispense Refill  . albuterol (VENTOLIN HFA) 108 (90 Base) MCG/ACT inhaler Inhale 1-2 puffs into the lungs every 4 (four) hours as needed for wheezing or shortness of breath. 18 g  12  . azelastine (ASTELIN) 0.1 % nasal spray     . buPROPion (WELLBUTRIN XL) 300 MG 24 hr tablet Take 1 tablet (300 mg total) by mouth daily. 90 tablet 1  . cyclobenzaprine (FLEXERIL) 10 MG tablet Take 10 mg by mouth every 8 (eight) hours as needed.    . diazepam (VALIUM) 5 MG tablet SMARTSIG:1 Tablet(s) By Mouth (Patient not taking: Reported on 06/29/2020)    . DULoxetine (CYMBALTA) 60 MG capsule Take 1 capsule (60 mg total) by mouth 2 (two) times daily. 60 capsule 1  . fluticasone (FLONASE) 50 MCG/ACT nasal spray TAKE 2 SPRAYS INTO BOTH NOSTRILS DAILY 16 g 11  . furosemide (LASIX) 40 MG tablet Take 1 tablet (40 mg total) by mouth daily. 90 tablet 3  . gabapentin (NEURONTIN) 600 MG tablet Take 1,200 mg by mouth 3 (three) times daily.    . hydrOXYzine (VISTARIL) 25 MG capsule TAKE 1 CAPSULE EVERY EIGHT HOURS AS NEEDED FOR ANXIETY 90 capsule 1  . ipratropium (ATROVENT) 0.06 % nasal spray PLACE 2 SPRAYS INTO BOTH NOSTRILS 3 TIMES DAILY AS NEEDED 15 mL 12  . lamoTRIgine (LAMICTAL) 25 MG tablet TAKE 1 TABLET BY MOUTH ONCE DAILY 30 tablet 1  . letrozole (FEMARA) 2.5 MG tablet TAKE ONE TABLET BY MOUTH EVERY DAY 90 tablet 0  . metoprolol succinate (TOPROL-XL) 25 MG 24 hr tablet Take 1 tablet (25 mg total) by mouth daily. 90 tablet 3  . MODERNA COVID-19 VACCINE 100 MCG/0.5ML injection     . montelukast (SINGULAIR) 10 MG tablet Take 1 tablet (10 mg total) by mouth at bedtime. 90 tablet 3  . Multiple Vitamins-Minerals (MULTIVITAMIN)  tablet Take 1 tablet by mouth daily. 90 tablet 3  . oxymorphone (OPANA) 10 MG tablet Take 10 mg by mouth 2 (two) times daily.    . SENEXON-S 8.6-50 MG tablet TAKE 1-2 TABLETS BY MOUTH DAILY AS NEEDED FOR MILD CONSTIPATION. 180 tablet 1  . telmisartan (MICARDIS) 80 MG tablet Take 1 tablet (80 mg total) by mouth daily. 90 tablet 3  . Tiotropium Bromide-Olodaterol (STIOLTO RESPIMAT) 2.5-2.5 MCG/ACT AERS Inhale 2 puffs into the lungs daily. 4 g 11   No current  facility-administered medications for this visit.     Musculoskeletal: Strength & Muscle Tone: UTA Gait & Station: UTA Patient leans: N/A  Psychiatric Specialty Exam: Review of Systems  Constitutional: Positive for fatigue.  Musculoskeletal: Positive for back pain.  Psychiatric/Behavioral: Negative for agitation, behavioral problems, confusion, decreased concentration, dysphoric mood, hallucinations, self-injury, sleep disturbance and suicidal ideas. The patient is not nervous/anxious and is not hyperactive.   All other systems reviewed and are negative.   There were no vitals taken for this visit.There is no height or weight on file to calculate BMI.  General Appearance: Casual  Eye Contact:  Fair  Speech:  Clear and Coherent  Volume:  Normal  Mood:  Euthymic  Affect:  Congruent  Thought Process:  Goal Directed and Descriptions of Associations: Intact  Orientation:  Full (Time, Place, and Person)  Thought Content: Logical   Suicidal Thoughts:  No  Homicidal Thoughts:  No  Memory:  Immediate;   Fair Recent;   Fair Remote;   Fair  Judgement:  Fair  Insight:  Fair  Psychomotor Activity:  Normal  Concentration:  Concentration: Fair and Attention Span: Fair  Recall:  [employer] of Knowledge: Fair  Language: Fair  Akathisia:  No  Handed:  Right  AIMS (if indicated): UTA  Assets:  Communication Skills Desire for Improvement Housing Social Support  ADL's:  Intact  Cognition: WNL  Sleep:  Fair   Screenings: GAD-7   Flowsheet Row Office Visit from 06/12/2019 in [facility] Office Visit from 09/30/2017 in [facility]  Total GAD-7 Score 18 18    PHQ2-9   [place] Video Visit from 06/29/2020 in [facility] [place] Video Visit from 06/02/2020 in [facility] Video Visit from 05/09/2020 in [facility] from 04/12/2020 in [facility] Video Visit from 04/11/2020 in [facility]  PHQ-2 Total Score 0 2 1 2 2   PHQ-9 Total Score 4 7 12 13 13     Flowsheet Row Counselor from 06/06/2020 in [facility] Counselor from 05/10/2020 in [facility] Video Visit from 05/09/2020 in [facility] No Risk No Risk No Risk       Assessment and Plan: Ashley Cross is a 65 year old Caucasian female, divorced, disabled, has a history of depression, chronic pain, hypertension, history of breast cancer was evaluated by telemedicine today.  Patient is biologically predisposed given family history of substance abuse problems.  Patient with psychosocial stressors of health issues, financial problems, death of daughter.  She however is currently doing well.  Plan MDD in remission Cymbalta 60 mg p.o. twice daily Wellbutrin XL 300 mg p.o. daily-reduced dosage Lamictal 25 mg p.o. daily Hydroxyzine 25 mg p.o. daily as needed for severe anxiety attacks, sleep Continue CBT  Alcohol use disorder in partial remission Patient continues to cut back.  Tobacco use disorder-improving Provided counseling.  Patient to continue to follow up with pain provider for management of back pain.  Follow-up in clinic in 1 month or sooner if needed.  This note was generated in part or whole with voice recognition software. Voice recognition is usually quite accurate but there are transcription errors that can and very often do occur. I apologize for any typographical errors that were not detected and corrected.      Ursula Alert, MD 06/29/2020, 3:54 PM

## 2020-06-30 NOTE — Chronic Care Management (AMB) (Signed)
  Care Management   Note  06/30/2020 Name: Ashley Cross MRN: [id number] DOB: [birth year]  Ashley Cross is a 65 y.o. year old female who is a primary care patient of McLean-Scocuzza, Nino Glow, MD and is actively engaged with the care management team. I reached out to [name] by phone today to assist with re-scheduling a follow up visit with the Pharmacist  Follow up plan: Telephone appointment with care management team member scheduled for:08/18/2020  Noreene Larsson, [address], [place], [place] [address] Direct Dial: [phone] [email or web address]

## 2020-06-30 NOTE — [place] Notes (Signed)
error 

## 2020-07-05 ENCOUNTER — Other Ambulatory Visit: Payer: Self-pay

## 2020-07-05 ENCOUNTER — Ambulatory Visit (INDEPENDENT_AMBULATORY_CARE_PROVIDER_SITE_OTHER): Payer: Self-pay | Admitting: Licensed Clinical Social Worker

## 2020-07-05 DIAGNOSIS — Z5329 Procedure and treatment not carried out because of patient's decision for other reasons: Secondary | ICD-10-CM

## 2020-07-05 NOTE — [place] Notes (Signed)
LCSW counselor tried to connect with patient for scheduled appointment via MyChart video text request x 2 and email request; also tried to connect via phone without success. LCSW counselor left message for patient to call office number to reschedule OPT appointment.  Jeanmarie Plant, MSW, LCSW Outpatient Therapist/Triage Specialist

## 2020-07-22 ENCOUNTER — Other Ambulatory Visit: Payer: Self-pay | Admitting: Internal Medicine

## 2020-07-22 DIAGNOSIS — K5900 Constipation, unspecified: Secondary | ICD-10-CM

## 2020-07-27 ENCOUNTER — Telehealth: Payer: PPO | Admitting: Psychiatry

## 2020-07-28 ENCOUNTER — Encounter: Payer: Self-pay | Admitting: Psychiatry

## 2020-07-28 ENCOUNTER — Telehealth (INDEPENDENT_AMBULATORY_CARE_PROVIDER_SITE_OTHER): Payer: PPO | Admitting: Psychiatry

## 2020-07-28 ENCOUNTER — Other Ambulatory Visit: Payer: Self-pay

## 2020-07-28 DIAGNOSIS — M961 Postlaminectomy syndrome, not elsewhere classified: Secondary | ICD-10-CM | POA: Diagnosis not present

## 2020-07-28 DIAGNOSIS — F17200 Nicotine dependence, unspecified, uncomplicated: Secondary | ICD-10-CM | POA: Diagnosis not present

## 2020-07-28 DIAGNOSIS — M5416 Radiculopathy, lumbar region: Secondary | ICD-10-CM | POA: Diagnosis not present

## 2020-07-28 DIAGNOSIS — M546 Pain in thoracic spine: Secondary | ICD-10-CM | POA: Insufficient documentation

## 2020-07-28 DIAGNOSIS — G894 Chronic pain syndrome: Secondary | ICD-10-CM | POA: Diagnosis not present

## 2020-07-28 DIAGNOSIS — F1010 Alcohol abuse, uncomplicated: Secondary | ICD-10-CM | POA: Diagnosis not present

## 2020-07-28 DIAGNOSIS — F3342 Major depressive disorder, recurrent, in full remission: Secondary | ICD-10-CM

## 2020-07-28 NOTE — Progress Notes (Signed)
Virtual Visit via Video Note  I connected with Cicily Bonano on 07/28/20 at  3:00 PM EDT by a video enabled telemedicine application and verified that I am speaking with the correct person using two identifiers. Location Provider Location : [facility] Patient Location : Home  Participants: Patient , Provider   I discussed the limitations of evaluation and management by telemedicine and the availability of in person appointments. The patient expressed understanding and agreed to proceed.    I discussed the assessment and treatment plan with the patient. The patient was provided an opportunity to ask questions and all were answered. The patient agreed with the plan and demonstrated an understanding of the instructions.   The patient was advised to call back or seek an in-person evaluation if the symptoms worsen or if the condition fails to improve as anticipated.   [place] MD OP Progress Note  07/28/2020 3:14 PM Loxley Cibrian  MRN:  [id number]  Chief Complaint:  Chief Complaint   Follow-up; Depression; Insomnia    HPI: Moreen Piggott is a 65 year old Caucasian female on disability, lives in [place], has a history of MDD, history of breast cancer, chronic pain, hypertension was evaluated by telemedicine today.  Patient today she had a fall today while she was at a [employer].  She reports she was getting into a bathroom when it happened.  She reports she was evaluated by her pain provider.  She had imaging done.  She reports she feels better now and does not have any fractures.  Patient however continues to be in pain of her lower back although making progress.  Patient reports she is doing well overall with regards to her mood.  Reports her depression as improving.  She is coping with her grief better than before.  She reports she has been able to get into her late daughter's room and go through some of her belongings which is a big step for her.  Patient reports she  had a good time with her son who came to stay with her for a few days.  Patient reports sleep is overall okay.  Denies any suicidality, homicidality or perceptual disturbances.  Patient is currently trying to cut back on smoking.  Patient reports she occasionally has a drink of wine on a weekend or so.  She has been trying to stay sober.  Patient denies any other concerns today.  Visit Diagnosis:    ICD-10-CM   1. MDD (major depressive disorder), recurrent, in full remission ([place])  F33.42     2. Alcohol use disorder, mild, abuse  F10.10    in partial remission    3. Tobacco use disorder  F17.200       Past Psychiatric History: Reviewed past psychiatric history from progress note on 09/16/2018.  Past trials of Lexapro, Zoloft, Wellbutrin, Celexa  Past Medical History:  Past Medical History:  Diagnosis Date   Anxiety    Breast cancer ([place])    Left breast(nipple mass) 2018   Cervical stenosis of spine    Chicken pox    Chronic back pain    Colon polyps    COPD (chronic obstructive pulmonary disease) ([place])    Cyst of left nipple    Depression    Hypertension    Insomnia    Personal history of radiation therapy     Past Surgical History:  Procedure Laterality Date   BACK SURGERY     x 3 (2010 x 2; 2013 x 1)  BREAST BIOPSY Left    core- neg   BREAST LUMPECTOMY Left 2016-04-29   BREAST SURGERY Left    breast biopsy   CERVICAL CONE BIOPSY     CERVICAL FUSION     times 2   COLONOSCOPY WITH PROPOFOL N/A 04/18/2015   Procedure: COLONOSCOPY WITH PROPOFOL;  Surgeon: [name], MD;  Location: [facility] ENDOSCOPY;  Service: Endoscopy;  Laterality: N/A;   ELBOW SURGERY     04-29-2012 b/l elbows per pt ulnar nerve    ELBOW SURGERY     x2    EPIDURAL BLOCK INJECTION     Dr. Maryjean Ka    MASS EXCISION Left 04/12/2016   Procedure: EXCISION OF LEFT NIPPLE MASS;  Surgeon: Stark Klein, MD;  Location: [place];  Service: General;  Laterality: Left;   NECK SURGERY      POSTERIOR CERVICAL FUSION/FORAMINOTOMY  03/07/2011   Procedure: POSTERIOR CERVICAL FUSION/FORAMINOTOMY LEVEL 4;  Surgeon: Eustace Moore, MD;  Location: [place] NEURO ORS;  Service: Neurosurgery;  Laterality: N/A;  cervical three to thoracic one posterior cervical fusion    SENTINEL NODE BIOPSY Left 05/29/2016   Procedure: LEFT SENTINEL LYMPH NODE BIOPSY;  Surgeon: Stark Klein, MD;  Location: [place];  Service: General;  Laterality: Left;   TONSILLECTOMY      Family Psychiatric History: Reviewed family psychiatric history from progress note on 09/16/2018  Family History:  Family History  Problem Relation Age of Onset   Breast cancer Mother 32   Hypertension Mother    Cancer Mother        breast dx'ed 2    Cancer Father        prostate   Hypertension Father    Drug abuse Daughter    Hypertension Maternal Grandmother    Breast cancer Cousin     Social History: Reviewed social history from progress note on 09/16/2018 Social History   Socioeconomic History   Marital status: Divorced    Spouse name: Not on file   Number of children: 2   Years of education: Not on file   Highest education level: Not on file  Occupational History   Not on file  Tobacco Use   Smoking status: Every Day    Packs/day: 0.25    Years: 30.00    Pack years: 7.50    Types: Cigarettes   Smokeless tobacco: Never   Tobacco comments:    work days - 8 cigarettes; weekends - ~10   Vaping Use   Vaping Use: Every day  Substance and Sexual Activity   Alcohol use: Yes    Comment: social   Drug use: No    Comment: on opana since jan 2018   Sexual activity: Not on file  Other Topics Concern   Not on file  Social History Narrative   As of 01/25/17 not sexually active of in relationship    Divorced   2 kids son and daughter (though daughter died of suicide in 04/29/17)   Son lives in [place] now as of 12/2018       Disability    Loves animals has 3 dogs           Social Determinants of Systems developer Strain: Low Risk    Difficulty of Paying Living Expenses: Not hard at all  Food Insecurity: No Food Insecurity   Worried About Charity fundraiser in the Last Year: Never true   Garden in the Last  Year: Never true  Transportation Needs: No Transportation Needs   Lack of Transportation (Medical): No   Lack of Transportation (Non-Medical): No  Physical Activity: Not on file  Stress: Stress Concern Present   Feeling of Stress : Rather much  Social Connections: Unknown   Frequency of Communication with Friends and Family: More than three times a week   Frequency of Social Gatherings with Friends and Family: Not on file   Attends Religious Services: Not on file   Active Member of Clubs or Organizations: Not on file   Attends Archivist Meetings: Not on file   Marital Status: Not on file    Allergies:  Allergies  Allergen Reactions   Anastrozole    Augmentin [Amoxicillin-Pot Clavulanate]     Diarrhea    Norvasc [Amlodipine]     Swelling    Topamax [Topiramate]     Didn't like the way made feel more irritatble/sad    Sulfa Antibiotics Itching and Rash   Sulfonamide Derivatives Itching and Rash    Metabolic Disorder Labs: Lab Results  Component Value Date   HGBA1C 5.4 04/28/2020   No results found for: PROLACTIN Lab Results  Component Value Date   CHOL 186 04/28/2020   TRIG 73.0 04/28/2020   HDL 95.00 04/28/2020   CHOLHDL 2 04/28/2020   VLDL 14.6 04/28/2020   LDLCALC 76 04/28/2020   LDLCALC 85 08/25/2019   Lab Results  Component Value Date   TSH 0.71 08/25/2019   TSH 2.91 04/03/2019    Therapeutic Level Labs: No results found for: LITHIUM No results found for: VALPROATE No components found for:  CBMZ  Current Medications: Current Outpatient Medications  Medication Sig Dispense Refill   albuterol (VENTOLIN HFA) 108 (90 Base) MCG/ACT inhaler Inhale 1-2 puffs into the lungs every 4 (four) hours as needed for wheezing or shortness of  breath. 18 g 12   azelastine (ASTELIN) 0.1 % nasal spray      buPROPion (WELLBUTRIN XL) 300 MG 24 hr tablet Take 1 tablet (300 mg total) by mouth daily. 90 tablet 1   cyclobenzaprine (FLEXERIL) 10 MG tablet Take 10 mg by mouth every 8 (eight) hours as needed.     diazepam (VALIUM) 5 MG tablet SMARTSIG:1 Tablet(s) By Mouth (Patient not taking: Reported on 06/29/2020)     DULoxetine (CYMBALTA) 60 MG capsule Take 1 capsule (60 mg total) by mouth 2 (two) times daily. 60 capsule 1   fluticasone (FLONASE) 50 MCG/ACT nasal spray TAKE 2 SPRAYS INTO BOTH NOSTRILS DAILY 16 g 11   furosemide (LASIX) 40 MG tablet Take 1 tablet (40 mg total) by mouth daily. 90 tablet 3   gabapentin (NEURONTIN) 600 MG tablet Take 1,200 mg by mouth 3 (three) times daily.     hydrOXYzine (VISTARIL) 25 MG capsule TAKE 1 CAPSULE EVERY EIGHT HOURS AS NEEDED FOR ANXIETY 90 capsule 1   ipratropium (ATROVENT) 0.06 % nasal spray PLACE 2 SPRAYS INTO BOTH NOSTRILS 3 TIMES DAILY AS NEEDED 15 mL 12   lamoTRIgine (LAMICTAL) 25 MG tablet TAKE 1 TABLET BY MOUTH ONCE DAILY 30 tablet 1   letrozole (FEMARA) 2.5 MG tablet TAKE ONE TABLET BY MOUTH EVERY DAY 90 tablet 0   metoprolol succinate (TOPROL-XL) 25 MG 24 hr tablet Take 1 tablet (25 mg total) by mouth daily. 90 tablet 3   MODERNA COVID-19 VACCINE 100 MCG/0.5ML injection      montelukast (SINGULAIR) 10 MG tablet Take 1 tablet (10 mg total) by mouth at bedtime. 90 tablet  3   Multiple Vitamins-Minerals (MULTIVITAMIN) tablet Take 1 tablet by mouth daily. 90 tablet 3   oxymorphone (OPANA) 10 MG tablet Take 10 mg by mouth 2 (two) times daily.     SENEXON-S 8.6-50 MG tablet TAKE 1-2 TABLETS BY MOUTH DAILY AS NEEDED FOR MILD CONSTIPATION. 180 tablet 1   telmisartan (MICARDIS) 80 MG tablet Take 1 tablet (80 mg total) by mouth daily. 90 tablet 3   Tiotropium Bromide-Olodaterol (STIOLTO RESPIMAT) 2.5-2.5 MCG/ACT AERS Inhale 2 puffs into the lungs daily. 4 g 11   No current facility-administered  medications for this visit.     Musculoskeletal: Strength & Muscle Tone:  UTA Gait & Station:  UTA Patient leans: N/A  Psychiatric Specialty Exam: Review of Systems  Musculoskeletal:  Positive for back pain and myalgias.  Psychiatric/Behavioral:  The patient is nervous/anxious.   All other systems reviewed and are negative.  There were no vitals taken for this visit.There is no height or weight on file to calculate BMI.  General Appearance: Casual  Eye Contact:  Fair  Speech:  Clear and Coherent  Volume:  Normal  Mood:  Anxious  Affect:  Congruent  Thought Process:  Goal Directed and Descriptions of Associations: Intact  Orientation:  Full (Time, Place, and Person)  Thought Content: Logical   Suicidal Thoughts:  No  Homicidal Thoughts:  No  Memory:  Immediate;   Fair Recent;   Fair Remote;   Fair  Judgement:  Fair  Insight:  Fair  Psychomotor Activity:  Normal  Concentration:  Concentration: Good and Attention Span: Good  Recall:  Good  Fund of Knowledge: Good  Language: Good  Akathisia:  No  Handed:  Right  AIMS (if indicated): not done  Assets:  Communication Skills Desire for Improvement Housing Talents/Skills Transportation  ADL's:  Intact  Cognition: WNL  Sleep:  Fair   Screenings: GAD-7    [place] Office Visit from 06/12/2019 in [place] from 09/30/2017 in [facility]  Total GAD-7 Score 18 18      PHQ2-9    [place] Video Visit from 06/29/2020 in [place] Video Visit from 06/02/2020 in [place] Video Visit from 05/09/2020 in [facility] from 04/12/2020 in [place] Video Visit from 04/11/2020 in [place]  PHQ-2 Total Score 0 2 1 2 2   PHQ-9 Total Score 4 7 12 13 13       Flowsheet Row Counselor from 06/06/2020 in [place] from 05/10/2020 in [place] Video Visit from 05/09/2020 in [place] No Risk No Risk No Risk        Assessment and Plan: Deoni Cosey is a 65 year old Caucasian female, divorced, disabled, has a history of depression, chronic pain, hypertension, history of breast cancer was evaluated by telemedicine today.  Patient is biologically predisposed given family history of substance abuse problems.  Patient is currently stable.  Plan as noted below.  Plan MDD in remission Cymbalta 60 mg p.o. twice daily Wellbutrin XL 300 mg p.o. daily and reduced dosage Lamictal 25 mg p.o. daily Hydroxyzine 25 mg p.o. daily as needed for severe anxiety attacks, sleep. Continue CBT  Alcohol use disorder in remission Last drink was last Sunday-1 glass of wine.  Tobacco use disorder-improving Provided counseling  Patient to continue to follow-up with pain management and will need sufficient pain management.  Follow-up in clinic in 4 weeks or sooner if needed.  This note was generated in part or whole with voice recognition software. Voice recognition is usually quite accurate but there are transcription errors that can and very often do occur. I apologize for any typographical errors that were not detected and corrected.      Ursula Alert, MD 07/29/2020, 10:03 AM

## 2020-08-04 ENCOUNTER — Telehealth: Payer: Self-pay | Admitting: Internal Medicine

## 2020-08-04 NOTE — Telephone Encounter (Signed)
States she is having swelling in both ankles. No pain or recent injury. Patient states that when pressing down on her ankles it does not stay pitted but swells back up.   Patient on Furosemide 40 mg once daily and wanting to know if she should increase this?   Please advise

## 2020-08-04 NOTE — Telephone Encounter (Signed)
Appt scheduled

## 2020-08-05 ENCOUNTER — Ambulatory Visit (INDEPENDENT_AMBULATORY_CARE_PROVIDER_SITE_OTHER): Payer: PPO | Admitting: Internal Medicine

## 2020-08-05 ENCOUNTER — Encounter: Payer: Self-pay | Admitting: Internal Medicine

## 2020-08-05 ENCOUNTER — Other Ambulatory Visit: Payer: Self-pay

## 2020-08-05 VITALS — BP 136/70 | HR 76 | Temp 98.9°F | Ht 64.0 in | Wt 130.6 lb

## 2020-08-05 DIAGNOSIS — Z853 Personal history of malignant neoplasm of breast: Secondary | ICD-10-CM

## 2020-08-05 DIAGNOSIS — K635 Polyp of colon: Secondary | ICD-10-CM

## 2020-08-05 DIAGNOSIS — I10 Essential (primary) hypertension: Secondary | ICD-10-CM | POA: Diagnosis not present

## 2020-08-05 DIAGNOSIS — R7989 Other specified abnormal findings of blood chemistry: Secondary | ICD-10-CM

## 2020-08-05 DIAGNOSIS — T148XXA Other injury of unspecified body region, initial encounter: Secondary | ICD-10-CM | POA: Diagnosis not present

## 2020-08-05 DIAGNOSIS — I701 Atherosclerosis of renal artery: Secondary | ICD-10-CM

## 2020-08-05 DIAGNOSIS — I700 Atherosclerosis of aorta: Secondary | ICD-10-CM | POA: Diagnosis not present

## 2020-08-05 DIAGNOSIS — R609 Edema, unspecified: Secondary | ICD-10-CM

## 2020-08-05 DIAGNOSIS — D3502 Benign neoplasm of left adrenal gland: Secondary | ICD-10-CM

## 2020-08-05 DIAGNOSIS — K838 Other specified diseases of biliary tract: Secondary | ICD-10-CM

## 2020-08-05 DIAGNOSIS — Z1211 Encounter for screening for malignant neoplasm of colon: Secondary | ICD-10-CM | POA: Diagnosis not present

## 2020-08-05 MED ORDER — MUPIROCIN 2 % EX OINT: 1.0000 "application " | TOPICAL_OINTMENT | Freq: Two times a day (BID) | CUTANEOUS | 0 refills | Status: DC

## 2020-08-05 MED ORDER — DOXYCYCLINE HYCLATE 100 MG PO TABS: 100.0000 mg | ORAL_TABLET | Freq: Two times a day (BID) | ORAL | 0 refills | Status: DC

## 2020-08-05 NOTE — Patient Instructions (Addendum)
Coppertone compression stockings  [employer]  Furosemide/lasix 40 daily may consider taking 20 mg at lunch in the future for leg swelling as needed for 5-7 days but wait for now   Lymphedema  Lymphedema is swelling that is caused by the abnormal collection of lymph in the tissues under the skin. Lymph is excess fluid from the tissues in your body that is removed through the lymphatic system. This system is part of your body's defense system (immune system) and includes lymph nodes and lymph vessels. The lymph vessels collect and carry the excess fluid, fats, proteins, and waste from the tissues of the body to the bloodstream. This system also works to clean and remove bacteria andwaste products from the body. Lymphedema occurs when the lymphatic system is blocked. When the lymph vessels or lymph nodes are blocked or damaged, lymph does not drain properly. This causes an abnormal buildup of lymph, which leads to swelling in the affected area. This may include the trunk area, or an arm or leg. Lymphedema cannot becured by medicines, but various methods can be used to help reduce the swelling. What are the causes? The cause of this condition depends on the type of lymphedema that you have. Primary lymphedema is caused by the absence of lymph vessels or having abnormal lymph vessels at birth. Secondary lymphedema occurs when lymph vessels are blocked or damaged. Secondary lymphedema is more common. Common causes of lymph vessel blockage include: Skin infection, such as cellulitis. Infection by parasites (filariasis). Injury. Radiation therapy. Cancer. Formation of scar tissue. Surgery. What are the signs or symptoms? Symptoms of this condition include: Swelling of the arm or leg. A heavy or tight feeling in the arm or leg. Swelling of the feet, toes, or fingers. Shoes or rings may fit more tightly than before. Redness of the skin over the affected area. Limited movement of the affected  limb. Sensitivity to touch or discomfort in the affected limb. How is this diagnosed? This condition may be diagnosed based on: Your symptoms and medical history. A physical exam. Bioimpedance spectroscopy. In this test, painless electrical currents are used to measure fluid levels in your body. Imaging tests, such as: MRI. CT scan. Duplex ultrasound. This test uses sound waves to produce images of the vessels and the blood flow on a screen. Lymphoscintigraphy. In this test, a low dose of a radioactive substance is injected to trace the flow of lymph through your lymph vessels. Lymphangiography. In this test, a contrast dye is injected into the lymph vessel to help show blockages. How is this treated?  If an underlying condition is causing the lymphedema, that condition will betreated. For example, antibiotic medicines may be used to treat an infection. Treatment for this condition will depend on the cause of your lymphedema. Treatment may include: Complete decongestive therapy (CDT). This is done by a certified lymphedema therapist to reduce fluid congestion. This therapy includes: Skin care. Compression wrapping of the affected area. Manual lymph drainage. This is a special massage technique that promotes lymph drainage out of a limb. Specific exercises. Certain exercises can help fluid move out of the affected limb. Compression. Various methods may be used to apply pressure to the affected limb to reduce the swelling. They include: Wearing compression stockings or sleeves on the affected limb. Wrapping the affected limb with special bandages. Surgery. This is usually done for severe cases only. For example, surgery may be done if you have trouble moving the limb or if the swelling does not get better  with other treatments. Follow these instructions at home: Self-care The affected area is more likely to become injured or infected. Take these steps to help prevent infection: Keep the  affected area clean and dry. Use approved creams or lotions to keep the skin moisturized. Protect your skin from cuts: Use gloves while cooking or gardening. Do not walk barefoot. If you shave the affected area, use an Copy. Do not wear tight clothes, shoes, or jewelry. Eat a healthy diet that includes a lot of fruits and vegetables. Activity Do exercises as told by your health care provider. Do not sit with your legs crossed. When possible, keep the affected limb raised (elevated) above the level of your heart. Avoid carrying things with an arm that is affected by lymphedema. General instructions Wear compression stockings or sleeves as told by your health care provider. Note any changes in size of the affected limb. You may be instructed to take regular measurements and keep track of them. Take over-the-counter and prescription medicines only as told by your health care provider. If you were prescribed an antibiotic medicine, take or apply it as told by your health care provider. Do not stop using the antibiotic even if you start to feel better or if your condition improves. Do not use heating pads or ice packs on the affected area. Avoid having blood draws, IV insertions, or blood pressure checks on the affected limb. Keep all follow-up visits. This is important. Contact a health care provider if you: Continue to have swelling in your limb. Have fluid leaking from the skin of your swollen limb. Have a cut that does not heal. Have redness or pain in the affected area. Develop purplish spots, rash, blisters, or sores (lesions) on your affected limb. Get help right away if you: Have new swelling in your limb that starts suddenly. Have shortness of breath or chest pain. Have a fever or chills. These symptoms may represent a serious problem that is an emergency. Do not wait to see if the symptoms will go away. Get medical help right away. Call your local emergency services (911  in the [place] Do not drive yourself to the hospital. Summary Lymphedema is swelling that is caused by the abnormal collection of lymph in the tissues under the skin. Lymph is fluid from the tissues in your body that is removed through the lymphatic system. This system collects and carries excess fluid, fats, proteins, and wastes from the tissues of the body to the bloodstream. Lymphedema causes swelling, pain, and redness in the affected area. This may include the trunk area, or an arm or leg. Treatment for this condition may depend on the cause of your lymphedema. Treatment may include treating the underlying cause, complete decongestive therapy (CDT), compression methods, or surgery. This information is not intended to replace advice given to you by your health care provider. Make sure you discuss any questions you have with your healthcare provider. Document Revised: 11/18/2019 Document Reviewed: 11/18/2019 Elsevier Patient Education  2022 [employer].  Edema  Edema is an abnormal buildup of fluids in the body tissues and under the skin. Swelling of the legs, feet, and ankles is a common symptom that becomes more likely as you get older. Swelling is also common in looser tissues, like around the eyes. When the affected area is squeezed, the fluid may move out of thatspot and leave a dent for a few moments. This dent is called pitting edema. There are many possible causes of edema. Eating too  much salt (sodium) and being on your feet or sitting for a long time can cause edema in your legs, feet, and ankles. Hot weather may make edema worse. Common causes of edema include: Heart failure. Liver or kidney disease. Weak leg blood vessels. Cancer. An injury. Pregnancy. Medicines. Being obese. Low protein levels in the blood. Edema is usually painless. Your skin may look swollen or shiny. Follow these instructions at home: Keep the affected body part raised (elevated) above the level of your  heart when you are sitting or lying down. Do not sit still or stand for long periods of time. Do not wear tight clothing. Do not wear garters on your upper legs. Exercise your legs to get your circulation going. This helps to move the fluid back into your blood vessels, and it may help the swelling go down. Wear elastic bandages or support stockings to reduce swelling as told by your health care provider. Eat a low-salt (low-sodium) diet to reduce fluid as told by your health care provider. Depending on the cause of your swelling, you may need to limit how much fluid you drink (fluid restriction). Take over-the-counter and prescription medicines only as told by your health care provider. Contact a health care provider if: Your edema does not get better with treatment. You have heart, liver, or kidney disease and have symptoms of edema. You have sudden and unexplained weight gain. Get help right away if: You develop shortness of breath or chest pain. You cannot breathe when you lie down. You develop pain, redness, or warmth in the swollen areas. You have heart, liver, or kidney disease and suddenly get edema. You have a fever and your symptoms suddenly get worse. Summary Edema is an abnormal buildup of fluids in the body tissues and under the skin. Eating too much salt (sodium) and being on your feet or sitting for a long time can cause edema in your legs, feet, and ankles. Keep the affected body part raised (elevated) above the level of your heart when you are sitting or lying down. This information is not intended to replace advice given to you by your health care provider. Make sure you discuss any questions you have with your healthcare provider. Document Revised: 12/01/2019 Document Reviewed: 11/17/2019 Elsevier Patient Education  2022 [employer].

## 2020-08-06 LAB — COMPREHENSIVE METABOLIC PANEL
AG Ratio: 1.6 (calc) (ref 1.0–2.5)
ALT: 21 U/L (ref 6–29)
AST: 26 U/L (ref 10–35)
Albumin: 4 g/dL (ref 3.6–5.1)
Alkaline phosphatase (APISO): 99 U/L (ref 37–153)
BUN: 9 mg/dL (ref 7–25)
CO2: 25 mmol/L (ref 20–32)
Calcium: 9 mg/dL (ref 8.6–10.4)
Chloride: 99 mmol/L (ref 98–110)
Creat: 0.73 mg/dL (ref 0.50–0.99)
Globulin: 2.5 g/dL (calc) (ref 1.9–3.7)
Glucose, Bld: 82 mg/dL (ref 65–99)
Potassium: 4.3 mmol/L (ref 3.5–5.3)
Sodium: 136 mmol/L (ref 135–146)
Total Bilirubin: 0.4 mg/dL (ref 0.2–1.2)
Total Protein: 6.5 g/dL (ref 6.1–8.1)

## 2020-08-06 LAB — BRAIN NATRIURETIC PEPTIDE: Brain Natriuretic Peptide: 171 pg/mL — ABNORMAL HIGH (ref <100)

## 2020-08-09 ENCOUNTER — Telehealth: Payer: Self-pay | Admitting: Internal Medicine

## 2020-08-09 DIAGNOSIS — R7989 Other specified abnormal findings of blood chemistry: Secondary | ICD-10-CM | POA: Insufficient documentation

## 2020-08-09 NOTE — Telephone Encounter (Signed)
Lft pt vm to call ofc to sch echo and CT. thanks

## 2020-08-09 NOTE — Progress Notes (Addendum)
Chief Complaint  Patient presents with   Joint Swelling    Ankle swelling    F/u  1. Edema b/l legs and at times b/l hands s/p left breast cancer surgery taking lasix 40 mg qd not sure if needs to take more also abdomen swelling  2. S/p fall and pending MRI Dr. Shearon Stalls pain clinic Dr. Maryjean Ka left has bruises to b/l legs   Review of Systems  Constitutional:  Negative for weight loss.  HENT:  Negative for hearing loss.   Eyes:  Negative for blurred vision.  Respiratory:  Negative for shortness of breath.   Cardiovascular:  Positive for leg swelling. Negative for chest pain.  Musculoskeletal:  Positive for falls.  Neurological:  Negative for headaches.  Psychiatric/Behavioral:  Negative for depression.   Past Medical History:  Diagnosis Date   Anxiety    Breast cancer ([place])    Left breast(nipple mass) 2018   Cervical stenosis of spine    Chicken pox    Chronic back pain    Colon polyps    COPD (chronic obstructive pulmonary disease) (HCC)    Cyst of left nipple    Depression    Hypertension    Insomnia    Personal history of radiation therapy    Past Surgical History:  Procedure Laterality Date   BACK SURGERY     x 3 (2010 x 2; 2013 x 1)    BREAST BIOPSY Left    core- neg   BREAST LUMPECTOMY Left 2018   BREAST SURGERY Left    breast biopsy   CERVICAL CONE BIOPSY     CERVICAL FUSION     times 2   COLONOSCOPY WITH PROPOFOL N/A 04/18/2015   Procedure: COLONOSCOPY WITH PROPOFOL;  Surgeon: [name], MD;  Location: [facility]COPY;  Service: Endoscopy;  Laterality: N/A;   ELBOW SURGERY     2014 b/l elbows per pt ulnar nerve    ELBOW SURGERY     x2    EPIDURAL BLOCK INJECTION     Dr. Maryjean Ka    MASS EXCISION Left 04/12/2016   Procedure: EXCISION OF LEFT NIPPLE MASS;  Surgeon: Stark Klein, MD;  Location: [facility];  Service: General;  Laterality: Left;   NECK SURGERY     POSTERIOR CERVICAL FUSION/FORAMINOTOMY  03/07/2011   Procedure: POSTERIOR  CERVICAL FUSION/FORAMINOTOMY LEVEL 4;  Surgeon: Eustace Moore, MD;  Location: [place] NEURO ORS;  Service: Neurosurgery;  Laterality: N/A;  cervical three to thoracic one posterior cervical fusion    SENTINEL NODE BIOPSY Left 05/29/2016   Procedure: LEFT SENTINEL LYMPH NODE BIOPSY;  Surgeon: Stark Klein, MD;  Location: [facility];  Service: General;  Laterality: Left;   TONSILLECTOMY     Family History  Problem Relation Age of Onset   Breast cancer Mother 64   Hypertension Mother    Cancer Mother        breast dx'ed 6    Cancer Father        prostate   Hypertension Father    Drug abuse Daughter    Hypertension Maternal Grandmother    Breast cancer Cousin    Social History   Socioeconomic History   Marital status: Divorced    Spouse name: Not on file   Number of children: 2   Years of education: Not on file   Highest education level: Not on file  Occupational History   Not on file  Tobacco Use   Smoking status: Every Day  Packs/day: 0.25    Years: 30.00    Pack years: 7.50    Types: Cigarettes   Smokeless tobacco: Never   Tobacco comments:    work days - 8 cigarettes; weekends - ~10   Vaping Use   Vaping Use: Every day  Substance and Sexual Activity   Alcohol use: Yes    Comment: social   Drug use: No    Comment: on opana since jan 2018   Sexual activity: Not on file  Other Topics Concern   Not on file  Social History Narrative   As of 01/25/17 not sexually active of in relationship    Divorced   2 kids son and daughter (though daughter died of suicide in 05-22-17)   Son lives in [place] now as of 12/2018       Disability    Loves animals has 3 dogs           Social Determinants of Radio broadcast assistant Strain: Low Risk    Difficulty of Paying Living Expenses: Not hard at all  Food Insecurity: No Food Insecurity   Worried About Charity fundraiser in the Last Year: Never true   Arboriculturist in the Last Year: Never true  Transportation Needs: No  Transportation Needs   Lack of Transportation (Medical): No   Lack of Transportation (Non-Medical): No  Physical Activity: Not on file  Stress: Stress Concern Present   Feeling of Stress : Rather much  Social Connections: Unknown   Frequency of Communication with Friends and Family: More than three times a week   Frequency of Social Gatherings with Friends and Family: Not on file   Attends Religious Services: Not on file   Active Member of Clubs or Organizations: Not on file   Attends Archivist Meetings: Not on file   Marital Status: Not on file  Intimate Partner Violence: Not At Risk   Fear of Current or Ex-Partner: No   Emotionally Abused: No   Physically Abused: No   Sexually Abused: No   Current Meds  Medication Sig   albuterol (VENTOLIN HFA) 108 (90 Base) MCG/ACT inhaler Inhale 1-2 puffs into the lungs every 4 (four) hours as needed for wheezing or shortness of breath.   azelastine (ASTELIN) 0.1 % nasal spray    buPROPion (WELLBUTRIN XL) 300 MG 24 hr tablet Take 1 tablet (300 mg total) by mouth daily.   cyclobenzaprine (FLEXERIL) 10 MG tablet Take 10 mg by mouth every 8 (eight) hours as needed.   doxycycline (VIBRA-TABS) 100 MG tablet Take 1 tablet (100 mg total) by mouth 2 (two) times daily. For food   DULoxetine (CYMBALTA) 60 MG capsule Take 1 capsule (60 mg total) by mouth 2 (two) times daily.   fluticasone (FLONASE) 50 MCG/ACT nasal spray TAKE 2 SPRAYS INTO BOTH NOSTRILS DAILY   furosemide (LASIX) 40 MG tablet Take 1 tablet (40 mg total) by mouth daily.   gabapentin (NEURONTIN) 600 MG tablet Take 1,200 mg by mouth 3 (three) times daily.   hydrOXYzine (VISTARIL) 25 MG capsule TAKE 1 CAPSULE EVERY EIGHT HOURS AS NEEDED FOR ANXIETY   ipratropium (ATROVENT) 0.06 % nasal spray PLACE 2 SPRAYS INTO BOTH NOSTRILS 3 TIMES DAILY AS NEEDED   letrozole (FEMARA) 2.5 MG tablet TAKE ONE TABLET BY MOUTH EVERY DAY   metoprolol succinate (TOPROL-XL) 25 MG 24 hr tablet Take 1 tablet  (25 mg total) by mouth daily.   montelukast (SINGULAIR) 10 MG tablet  Take 1 tablet (10 mg total) by mouth at bedtime.   Multiple Vitamins-Minerals (MULTIVITAMIN) tablet Take 1 tablet by mouth daily.   mupirocin ointment (BACTROBAN) 2 % Apply 1 application topically 2 (two) times daily.   oxymorphone (OPANA) 10 MG tablet Take 10 mg by mouth 2 (two) times daily.   SENEXON-S 8.6-50 MG tablet TAKE 1-2 TABLETS BY MOUTH DAILY AS NEEDED FOR MILD CONSTIPATION.   telmisartan (MICARDIS) 80 MG tablet Take 1 tablet (80 mg total) by mouth daily.   Tiotropium Bromide-Olodaterol (STIOLTO RESPIMAT) 2.5-2.5 MCG/ACT AERS Inhale 2 puffs into the lungs daily.   Allergies  Allergen Reactions   Anastrozole    Augmentin [Amoxicillin-Pot Clavulanate]     Diarrhea    Norvasc [Amlodipine]     Swelling    Topamax [Topiramate]     Didn't like the way made feel more irritatble/sad    Sulfa Antibiotics Itching and Rash   Sulfonamide Derivatives Itching and Rash   Recent Results (from the past 2160 hour(s))  Comprehensive metabolic panel     Status: None   Collection Time: 08/05/20  4:56 PM  Result Value Ref Range   Glucose, Bld 82 65 - 99 mg/dL    Comment: .            Fasting reference interval .    BUN 9 7 - 25 mg/dL   Creat 0.73 0.50 - 0.99 mg/dL    Comment: For patients >26 years of age, the reference limit for Creatinine is approximately 13% higher for people identified as African-American. .    BUN/Creatinine Ratio NOT APPLICABLE 6 - 22 (calc)   Sodium 136 135 - 146 mmol/L   Potassium 4.3 3.5 - 5.3 mmol/L   Chloride 99 98 - 110 mmol/L   CO2 25 20 - 32 mmol/L   Calcium 9.0 8.6 - 10.4 mg/dL   Total Protein 6.5 6.1 - 8.1 g/dL   Albumin 4.0 3.6 - 5.1 g/dL   Globulin 2.5 1.9 - 3.7 g/dL (calc)   AG Ratio 1.6 1.0 - 2.5 (calc)   Total Bilirubin 0.4 0.2 - 1.2 mg/dL   Alkaline phosphatase (APISO) 99 37 - 153 U/L   AST 26 10 - 35 U/L   ALT 21 6 - 29 U/L  B Nat Peptide     Status: Abnormal    Collection Time: 08/05/20  4:56 PM  Result Value Ref Range   Brain Natriuretic Peptide 171 (H) <100 pg/mL    Comment: . BNP levels increase with age in the general population with the highest values seen in individuals greater than 44 years of age. Reference: J. Am. [place] Ar. Cardiol. 2002; 09:735-329. .    Objective  Body mass index is 22.42 kg/m. Wt Readings from Last 3 Encounters:  08/05/20 130 lb 9.6 oz (59.2 kg)  04/28/20 129 lb 4 oz (58.6 kg)  04/20/20 131 lb (59.4 kg)   Temp Readings from Last 3 Encounters:  08/05/20 98.9 F (37.2 C) (Oral)  04/28/20 97.9 F (36.6 C)  09/23/19 98.2 F (36.8 C) (Oral)   BP Readings from Last 3 Encounters:  08/05/20 136/70  04/28/20 (!) 160/80  09/23/19 112/70   Pulse Readings from Last 3 Encounters:  08/05/20 76  04/28/20 85  09/23/19 86    Physical Exam Vitals and nursing note reviewed.  Constitutional:      Appearance: Normal appearance. She is well-developed and well-groomed.  HENT:     Head: Normocephalic and atraumatic.  Eyes:     Conjunctiva/sclera:  Conjunctivae normal.     Pupils: Pupils are equal, round, and reactive to light.  Cardiovascular:     Rate and Rhythm: Normal rate and regular rhythm.     Heart sounds: Normal heart sounds. No murmur heard. Pulmonary:     Effort: Pulmonary effort is normal.     Breath sounds: Normal breath sounds.  Musculoskeletal:     Right lower leg: 1+ Pitting Edema present.     Left lower leg: 1+ Pitting Edema present.  Skin:    General: Skin is warm and dry.  Neurological:     General: No focal deficit present.     Mental Status: She is alert and oriented to person, place, and time. Mental status is at baseline.     Gait: Gait normal.  Psychiatric:        Attention and Perception: Attention and perception normal.        Mood and Affect: Mood and affect normal.        Speech: Speech normal.        Behavior: Behavior normal. Behavior is cooperative.        Thought Content:  Thought content normal.        Cognition and Memory: Cognition and memory normal.        Judgment: Judgment normal.    Assessment  Plan  Edema, unspecified type - Plan: Comprehensive metabolic panel, B Nat Peptide, ECHOCARDIOGRAM COMPLETE Coppertone compression stockings  [employer]  Furosemide/lasix 40 daily may consider taking 20 mg at lunch in the future for leg swelling as needed for 5-7 days but wait for now  Resch CT ab/pelvis   Open wound - Plan: mupirocin ointment (BACTROBAN) 2 %, doxycycline (VIBRA-TABS) 100 MG tablet Tdap utd  Dove antibacterial soap   Hypertension, unspecified type - Plan: ECHOCARDIOGRAM COMPLETE Left renal artery with plaque  -refer to vascular to do US renal duplex Dr. [name]   Elevated brain natriuretic peptide (BNP) level - Plan: ECHOCARDIOGRAM COMPLETE  History of breast cancer - Plan: ECHOCARDIOGRAM COMPLETE   HM Flu shot utd  Tdap UTD  pna 23 utd prevnar will need age 42 covid shot 3/3 rec shingrix vaccine as well for future    Pap 06/12/19 neg neg HPV   breast exam 06/12/19 normal  [place] sch 05/18/20 and mammo with lymph swelling and pain will try to do dexa   mammo with h/o left breast cancer  04/12/16 invasive ductal ca grade 1 0.8 cm  -h/o breast cancer left  -letrozole likely causing sweating/hot flashes she will f/u with h/o in 08/2018  06/08/19 negative mammogram had f/u [name] 09/06/20 Dr. Barry Dienes surgery following as well   dexa 05/01/17 osteopenia rec calcium and vitamin D repeat in 3-5 years    Colonoscopy had 04/18/15 see report tubular adenomas repeat in 5 years [facility]  06/20/20 or 6/22 wants to go back [facility] Dr. Ivor Messier since she had a polyp removed in a difficult area on he did this -referred [facility] 04/2020 for f/u 06/2020    Hep C neg 01/20/16    Ct ab pelvis 08/26/20  IMPRESSION: 1. No acute abdominal/pelvic findings, mass lesions or adenopathy. 2. Stable left adrenal gland adenoma. 3. Mild central intrahepatic and common bile  duct dilatation without cause. Recommend correlation with liver function studies. 4. Aortic atherosclerosis + left renal artery +   Aortic Atherosclerosis (ICD10-I70.0).     Electronically Signed   By: Marijo Sanes M.D.   On: 08/28/2020 14:54 rec smoking  cessation COPD at least 10 cig per day smoking as of 06/12/19 -07/02/19 on Stiolto per catie pharm D was on anoro but became costly and prn albuterol    Saw Dr. Maryjean Ka 07/2018 chronic neck and back pain as of summer 2022 Dr. Shearon Stalls Dr. Maryjean Ka left practice  Provider: Dr. Olivia Mackie McLean-[facility]

## 2020-08-10 ENCOUNTER — Telehealth: Payer: Self-pay | Admitting: Internal Medicine

## 2020-08-10 NOTE — Telephone Encounter (Signed)
PT called to return the missed call about lab results.

## 2020-08-15 ENCOUNTER — Other Ambulatory Visit: Payer: Self-pay | Admitting: Psychiatry

## 2020-08-15 DIAGNOSIS — F339 Major depressive disorder, recurrent, unspecified: Secondary | ICD-10-CM

## 2020-08-18 ENCOUNTER — Telehealth: Payer: PPO

## 2020-08-18 ENCOUNTER — Telehealth: Payer: Self-pay | Admitting: Pharmacist

## 2020-08-18 NOTE — Telephone Encounter (Signed)
  Chronic Care Management   Note  08/18/2020 Name: Ashley Cross MRN: [id number] DOB: 1955-12-13   Attempted to contact patient for scheduled appointment for medication management support. Left HIPAA compliant message for patient to return my call at their convenience.    Plan: - If I do not hear back from the patient by end of business today, will collaborate with Care Guide to outreach to schedule follow up with me   SIGNATURE

## 2020-08-23 ENCOUNTER — Telehealth: Payer: Self-pay

## 2020-08-23 NOTE — Chronic Care Management (AMB) (Signed)
  Care Management   Note  08/23/2020 Name: Ashley Cross MRN: [id number] DOB: [birth year]  Ashley Cross is a 65 y.o. year old female who is a primary care patient of McLean-Scocuzza, Nino Glow, MD and is actively engaged with the care management team. I reached out to [name] by phone today to assist with re-scheduling a follow up visit with the Pharmacist  Follow up plan: Unsuccessful telephone outreach attempt made. A HIPAA compliant phone message was left for the patient providing contact information and requesting a return call.  The care management team will reach out to the patient again over the next 7 days.  If patient returns call to provider office, please advise to call [facility] at [place], [place], [place], [place] [address] Direct Dial: [phone] [email or web address]

## 2020-08-26 ENCOUNTER — Other Ambulatory Visit: Payer: Self-pay

## 2020-08-26 ENCOUNTER — Ambulatory Visit
Admission: RE | Admit: 2020-08-26 | Discharge: 2020-08-26 | Disposition: A | Discharge status: Home / Self Care | Payer: PPO | Source: Ambulatory Visit | Attending: Internal Medicine | Admitting: Internal Medicine

## 2020-08-26 DIAGNOSIS — D3502 Benign neoplasm of left adrenal gland: Secondary | ICD-10-CM | POA: Diagnosis not present

## 2020-08-26 DIAGNOSIS — D126 Benign neoplasm of colon, unspecified: Secondary | ICD-10-CM

## 2020-08-26 DIAGNOSIS — Z853 Personal history of malignant neoplasm of breast: Secondary | ICD-10-CM

## 2020-08-26 DIAGNOSIS — K635 Polyp of colon: Secondary | ICD-10-CM

## 2020-08-26 DIAGNOSIS — R2232 Localized swelling, mass and lump, left upper limb: Secondary | ICD-10-CM

## 2020-08-26 DIAGNOSIS — R5383 Other fatigue: Secondary | ICD-10-CM

## 2020-08-26 DIAGNOSIS — Z8739 Personal history of other diseases of the musculoskeletal system and connective tissue: Secondary | ICD-10-CM | POA: Diagnosis not present

## 2020-08-26 DIAGNOSIS — K838 Other specified diseases of biliary tract: Secondary | ICD-10-CM | POA: Diagnosis not present

## 2020-08-26 MED ORDER — IOPAMIDOL (ISOVUE-300) INJECTION 61%
100.0000 mL | Freq: Once | INTRAVENOUS | Status: AC | PRN
  Administered 2020-08-26: 100 mL via INTRAVENOUS

## 2020-08-27 ENCOUNTER — Other Ambulatory Visit: Payer: Self-pay | Admitting: Neurosurgery

## 2020-08-27 DIAGNOSIS — M5416 Radiculopathy, lumbar region: Secondary | ICD-10-CM

## 2020-08-27 DIAGNOSIS — M961 Postlaminectomy syndrome, not elsewhere classified: Secondary | ICD-10-CM

## 2020-08-29 ENCOUNTER — Other Ambulatory Visit: Payer: Self-pay

## 2020-08-29 ENCOUNTER — Ambulatory Visit
Admission: RE | Admit: 2020-08-29 | Discharge: 2020-08-29 | Disposition: A | Discharge status: Home / Self Care | Payer: PPO | Source: Ambulatory Visit | Attending: Internal Medicine | Admitting: Internal Medicine

## 2020-08-29 DIAGNOSIS — I10 Essential (primary) hypertension: Secondary | ICD-10-CM | POA: Diagnosis not present

## 2020-08-29 DIAGNOSIS — R7989 Other specified abnormal findings of blood chemistry: Secondary | ICD-10-CM

## 2020-08-29 DIAGNOSIS — Z853 Personal history of malignant neoplasm of breast: Secondary | ICD-10-CM | POA: Diagnosis not present

## 2020-08-29 DIAGNOSIS — I351 Nonrheumatic aortic (valve) insufficiency: Secondary | ICD-10-CM | POA: Insufficient documentation

## 2020-08-29 DIAGNOSIS — J449 Chronic obstructive pulmonary disease, unspecified: Secondary | ICD-10-CM | POA: Insufficient documentation

## 2020-08-29 DIAGNOSIS — R609 Edema, unspecified: Secondary | ICD-10-CM | POA: Diagnosis not present

## 2020-08-29 HISTORY — DX: Nonrheumatic aortic (valve) insufficiency: I35.1

## 2020-08-29 LAB — ECHOCARDIOGRAM COMPLETE
AR max vel: 2.42 cm2
AV Area VTI: 2.46 cm2
AV Area mean vel: 2.48 cm2
AV Mean grad: 5 mmHg
AV Peak grad: 9.9 mmHg
Ao pk vel: 1.57 m/s
Area-P 1/2: 3.89 cm2
MV VTI: 4.05 cm2
P 1/2 time: 390 msec
S' Lateral: 2.8 cm

## 2020-08-29 NOTE — Progress Notes (Signed)
*  PRELIMINARY RESULTS* Echocardiogram 2D Echocardiogram has been performed.  Ashley Cross Ashley Cross 08/29/2020, 11:35 AM

## 2020-08-30 ENCOUNTER — Other Ambulatory Visit: Payer: Self-pay | Admitting: Psychiatry

## 2020-08-30 DIAGNOSIS — F331 Major depressive disorder, recurrent, moderate: Secondary | ICD-10-CM

## 2020-08-30 NOTE — Chronic Care Management (AMB) (Signed)
  Care Management   Note  08/30/2020 Name: [name] MRN: [id number] DOB: [birth year]  Ashley Cross is a 65 y.o. year old female who is a primary care patient of McLean-Scocuzza, Nino Glow, MD and is actively engaged with the care management team. I reached out to [name] by phone today to assist with re-scheduling a follow up visit with the Pharmacist  Follow up plan: Unsuccessful telephone outreach attempt made. A HIPAA compliant phone message was left for the patient providing contact information and requesting a return call.  The care management team will reach out to the patient again over the next 7 days.  If patient returns call to provider office, please advise to call [facility]  at [place], [place], [place], [place] [address] Direct Dial: [phone] [email or web address]: [email or web address]

## 2020-09-01 ENCOUNTER — Other Ambulatory Visit: Payer: Self-pay

## 2020-09-01 ENCOUNTER — Telehealth (INDEPENDENT_AMBULATORY_CARE_PROVIDER_SITE_OTHER): Payer: PPO | Admitting: Psychiatry

## 2020-09-01 ENCOUNTER — Encounter: Payer: Self-pay | Admitting: Psychiatry

## 2020-09-01 DIAGNOSIS — F17200 Nicotine dependence, unspecified, uncomplicated: Secondary | ICD-10-CM

## 2020-09-01 DIAGNOSIS — F339 Major depressive disorder, recurrent, unspecified: Secondary | ICD-10-CM | POA: Diagnosis not present

## 2020-09-01 DIAGNOSIS — F1010 Alcohol abuse, uncomplicated: Secondary | ICD-10-CM

## 2020-09-01 MED ORDER — LAMOTRIGINE 25 MG PO TABS: 25.0000 mg | ORAL_TABLET | Freq: Two times a day (BID) | ORAL | 1 refills | Status: DC

## 2020-09-01 NOTE — Progress Notes (Signed)
Virtual Visit via Video Note  I connected with Ashley Cross on 09/01/20 at  3:00 PM EDT by a video enabled telemedicine application and verified that I am speaking with the correct person using two identifiers.  Location Provider Location : [facility] Patient Location : Home  Participants: Patient , Provider   I discussed the limitations of evaluation and management by telemedicine and the availability of in person appointments. The patient expressed understanding and agreed to proceed.    I discussed the assessment and treatment plan with the patient. The patient was provided an opportunity to ask questions and all were answered. The patient agreed with the plan and demonstrated an understanding of the instructions.   The patient was advised to call back or seek an in-person evaluation if the symptoms worsen or if the condition fails to improve as anticipated.    [place] MD OP Progress Note  09/01/2020 5:44 PM Ashley Cross  MRN:  [id number]  Chief Complaint:  Chief Complaint   Follow-up; Depression    HPI: Ashley Cross is a 65 year old Caucasian female on disability, lives in [place], has a history of MDD, history of breast cancer, chronic pain, hypertension was evaluated by telemedicine today.  Patient with recent falls, had MRI of her spine, recent edema of bilateral legs and at times of bilateral hands, status post left breast cancer surgery, on Lasix.  Patient reports she is also awaiting echocardiogram to rule out cardiac problems as well as may need GI consult for her adrenal mass.  She reports she has multiple problems going on and that makes her nervous.  Patient became very tearful during the session today.  She reports she keeps ruminating about being alone and not having support.  She does have a son who is very supportive however patient reports she does not want to burden him.  She recently had air-conditioning problems and reports that stressed her out so  much how she was finally able to get help from her son.  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  That keeps her busy.  Patient reports she is not interested in continuing psychotherapy sessions since she feels it is not beneficial.  She reports she is taking her medications as prescribed.  She wonders whether she could be sleepwalking since she noticed certain belongings moved away from its usual place when she woke up one morning.  However she is not very sure about this.  She feels she is overall sleeping okay.  She denies any suicidality.  She appeared to be alert oriented to person place time and situation.  Patient denies any other concerns today.    Visit Diagnosis:    ICD-10-CM   1. MDD (major depressive disorder), recurrent episode, with atypical features (HCC)  F33.9 lamoTRIgine (LAMICTAL) 25 MG tablet    2. Alcohol use disorder, mild, abuse  F10.10    in partial remission    3. Tobacco use disorder  F17.200       Past Psychiatric History: Reviewed past psychiatric history from progress note on 09/16/2018.  Past trials of Lexapro, Zoloft, Wellbutrin, Celexa  Past Medical History:  Past Medical History:  Diagnosis Date   Anxiety    Breast cancer ([place])    Left breast(nipple mass) 2018   Cervical stenosis of spine    Chicken pox    Chronic back pain    Colon polyps    COPD (chronic obstructive pulmonary disease) (HCC)    Cyst of left  nipple    Depression    Hypertension    Insomnia    Personal history of radiation therapy     Past Surgical History:  Procedure Laterality Date   BACK SURGERY     x 3 (05-18-08 x 2; 05/19/11 x 1)    BREAST BIOPSY Left    core- neg   BREAST LUMPECTOMY Left 05-18-16   BREAST SURGERY Left    breast biopsy   CERVICAL CONE BIOPSY     CERVICAL FUSION     times 2   COLONOSCOPY WITH PROPOFOL N/A 04/18/2015   Procedure: COLONOSCOPY WITH PROPOFOL;  Surgeon: [name], MD;  Location: [facility];  Service:  Endoscopy;  Laterality: N/A;   ELBOW SURGERY     05-18-2012 b/l elbows per pt ulnar nerve    ELBOW SURGERY     x2    EPIDURAL BLOCK INJECTION     Dr. Maryjean Ka    MASS EXCISION Left 04/12/2016   Procedure: EXCISION OF LEFT NIPPLE MASS;  Surgeon: Stark Klein, MD;  Location: [facility];  Service: General;  Laterality: Left;   NECK SURGERY     POSTERIOR CERVICAL FUSION/FORAMINOTOMY  03/07/2011   Procedure: POSTERIOR CERVICAL FUSION/FORAMINOTOMY LEVEL 4;  Surgeon: Eustace Moore, MD;  Location: [facility]UR[facility];  Service: Neurosurgery;  Laterality: N/A;  cervical three to thoracic one posterior cervical fusion    SENTINEL NODE BIOPSY Left 05/29/2016   Procedure: LEFT SENTINEL LYMPH NODE BIOPSY;  Surgeon: Stark Klein, MD;  Location: [facility];  Service: General;  Laterality: Left;   TONSILLECTOMY      Family Psychiatric History: Reviewed family psychiatric history from progress note on 09/16/2018  Family History:  Family History  Problem Relation Age of Onset   Breast cancer Mother 41   Hypertension Mother    Cancer Mother        breast dx'ed 22    Cancer Father        prostate   Hypertension Father    Drug abuse Daughter    Hypertension Maternal Grandmother    Breast cancer Cousin     Social History: Reviewed social history from progress note on 09/16/2018 Social History   Socioeconomic History   Marital status: Divorced    Spouse name: Not on file   Number of children: 2   Years of education: Not on file   Highest education level: Not on file  Occupational History   Not on file  Tobacco Use   Smoking status: Every Day    Packs/day: 0.25    Years: 30.00    Pack years: 7.50    Types: Cigarettes   Smokeless tobacco: Never   Tobacco comments:    work days - 8 cigarettes; weekends - ~10   Vaping Use   Vaping Use: Every day  Substance and Sexual Activity   Alcohol use: Yes    Comment: social   Drug use: No    Comment: on opana since jan 2018   Sexual activity: Not on  file  Other Topics Concern   Not on file  Social History Narrative   As of 01/25/17 not sexually active of in relationship    Divorced   2 kids son and daughter (though daughter died of suicide in May 18, 2017)   Son lives in [place] now as of 12/2018       Disability    Loves animals has 3 dogs           Social  Determinants of Health   Financial Resource Strain: Low Risk    Difficulty of Paying Living Expenses: Not hard at all  Food Insecurity: No Food Insecurity   Worried About [place] in the Last Year: Never true   Ran Out of Food in the Last Year: Never true  Transportation Needs: No Transportation Needs   Lack of Transportation (Medical): No   Lack of Transportation (Non-Medical): No  Physical Activity: Not on file  Stress: Stress Concern Present   Feeling of Stress : Rather much  Social Connections: Unknown   Frequency of Communication with Friends and Family: More than three times a week   Frequency of Social Gatherings with Friends and Family: Not on file   Attends Religious Services: Not on file   Active Member of Clubs or Organizations: Not on file   Attends Archivist Meetings: Not on file   Marital Status: Not on file    Allergies:  Allergies  Allergen Reactions   Anastrozole    Augmentin [Amoxicillin-Pot Clavulanate]     Diarrhea    Norvasc [Amlodipine]     Swelling    Topamax [Topiramate]     Didn't like the way made feel more irritatble/sad    Sulfa Antibiotics Itching and Rash   Sulfonamide Derivatives Itching and Rash    Metabolic Disorder Labs: Lab Results  Component Value Date   HGBA1C 5.4 04/28/2020   No results found for: PROLACTIN Lab Results  Component Value Date   CHOL 186 04/28/2020   TRIG 73.0 04/28/2020   HDL 95.00 04/28/2020   CHOLHDL 2 04/28/2020   VLDL 14.6 04/28/2020   LDLCALC 76 04/28/2020   LDLCALC 85 08/25/2019   Lab Results  Component Value Date   TSH 0.71 08/25/2019   TSH 2.91 04/03/2019     Therapeutic Level Labs: No results found for: LITHIUM No results found for: VALPROATE No components found for:  CBMZ  Current Medications: Current Outpatient Medications  Medication Sig Dispense Refill   albuterol (VENTOLIN HFA) 108 (90 Base) MCG/ACT inhaler Inhale 1-2 puffs into the lungs every 4 (four) hours as needed for wheezing or shortness of breath. 18 g 12   azelastine (ASTELIN) 0.1 % nasal spray      buPROPion (WELLBUTRIN XL) 300 MG 24 hr tablet Take 1 tablet (300 mg total) by mouth daily. 90 tablet 1   cyclobenzaprine (FLEXERIL) 10 MG tablet Take 10 mg by mouth every 8 (eight) hours as needed.     diazepam (VALIUM) 5 MG tablet SMARTSIG:1 Tablet(s) By Mouth (Patient not taking: Reported on 08/05/2020)     doxycycline (VIBRA-TABS) 100 MG tablet Take 1 tablet (100 mg total) by mouth 2 (two) times daily. For food 20 tablet 0   DULoxetine (CYMBALTA) 60 MG capsule Take 1 capsule (60 mg total) by mouth 2 (two) times daily. 60 capsule 1   fluticasone (FLONASE) 50 MCG/ACT nasal spray TAKE 2 SPRAYS INTO BOTH NOSTRILS DAILY 16 g 11   furosemide (LASIX) 40 MG tablet Take 1 tablet (40 mg total) by mouth daily. 90 tablet 3   gabapentin (NEURONTIN) 600 MG tablet Take 1,200 mg by mouth 3 (three) times daily.     hydrOXYzine (VISTARIL) 25 MG capsule TAKE ONE CAPSULE EVERY EIGHT HOURS AS NEEDED FOR ANXIETY 90 capsule 1   ipratropium (ATROVENT) 0.06 % nasal spray PLACE 2 SPRAYS INTO BOTH NOSTRILS 3 TIMES DAILY AS NEEDED 15 mL 12   lamoTRIgine (LAMICTAL) 25 MG tablet Take 1 tablet (25  mg total) by mouth 2 (two) times daily. 60 tablet 1   letrozole (FEMARA) 2.5 MG tablet TAKE ONE TABLET BY MOUTH EVERY DAY 90 tablet 0   metoprolol succinate (TOPROL-XL) 25 MG 24 hr tablet Take 1 tablet (25 mg total) by mouth daily. 90 tablet 3   MODERNA COVID-19 VACCINE 100 MCG/0.5ML injection  (Patient not taking: Reported on 08/05/2020)     montelukast (SINGULAIR) 10 MG tablet Take 1 tablet (10 mg total) by mouth at  bedtime. 90 tablet 3   Multiple Vitamins-Minerals (MULTIVITAMIN) tablet Take 1 tablet by mouth daily. 90 tablet 3   mupirocin ointment (BACTROBAN) 2 % Apply 1 application topically 2 (two) times daily. 30 g 0   oxymorphone (OPANA) 10 MG tablet Take 10 mg by mouth 2 (two) times daily.     SENEXON-S 8.6-50 MG tablet TAKE 1-2 TABLETS BY MOUTH DAILY AS NEEDED FOR MILD CONSTIPATION. 180 tablet 1   telmisartan (MICARDIS) 80 MG tablet Take 1 tablet (80 mg total) by mouth daily. 90 tablet 3   Tiotropium Bromide-Olodaterol (STIOLTO RESPIMAT) 2.5-2.5 MCG/ACT AERS Inhale 2 puffs into the lungs daily. 4 g 11   No current facility-administered medications for this visit.     Musculoskeletal: Strength & Muscle Tone:  UTA Gait & Station:  UTA Patient leans: N/A  Psychiatric Specialty Exam: Review of Systems  Musculoskeletal:        BL ankle edema  Psychiatric/Behavioral:  Positive for dysphoric mood and sleep disturbance. The patient is nervous/anxious.   All other systems reviewed and are negative.  There were no vitals taken for this visit.There is no height or weight on file to calculate BMI.  General Appearance: Casual  Eye Contact:  Fair  Speech:  Clear and Coherent  Volume:  Normal  Mood:  Depressed, anxious  Affect:  Tearful  Thought Process:  Goal Directed and Descriptions of Associations: Intact  Orientation:  Full (Time, Place, and Person)  Thought Content: Logical   Suicidal Thoughts:  No  Homicidal Thoughts:  No  Memory:  Immediate;   Fair Recent;   Fair Remote;   Fair  Judgement:  Fair  Insight:  Fair  Psychomotor Activity:  Normal  Concentration:  Concentration: Good and Attention Span: Good  Recall:  Good  Fund of Knowledge: Good  Language: Good  Akathisia:  No  Handed:  Right  AIMS (if indicated): not done  Assets:  Communication Skills Desire for Improvement Housing Social Support  ADL's:  Intact  Cognition: WNL  Sleep:   Restless   Screenings: GAD-7     Flowsheet Row Office Visit from 06/12/2019 in [facility] from 09/30/2017 in [facility]  Total GAD-7 Score 18 18      PHQ2-9    [place] Video Visit from 06/29/2020 in [facility] Video Visit from 06/02/2020 in [facility] Video Visit from 05/09/2020 in [facility] from 04/12/2020 in [facility] Video Visit from 04/11/2020 in [facility]  PHQ-2 Total Score 0 '2 1 2 2  '$ PHQ-9 Total Score '4 7 12 13 13      '$ Flowsheet Row Counselor from 06/06/2020 in [facility] Counselor from 05/10/2020 in [facility] Video Visit from 05/09/2020 in [facility] No Risk No Risk No Risk        Assessment and Plan: Ashley Cross is a 65 year old Caucasian  female, divorced, disabled, has a history of depression, chronic pain, hypertension, history of breast cancer was evaluated by telemedicine today.  Patient is biologically predisposed given family history of substance abuse problems.  Patient is currently struggling with multiple medical problems, financial problems that this make her depressed and anxious.  She will benefit from the following plan.  Plan MDD-unstable Increase lamotrigine to 25 mg p.o. twice daily Cymbalta 60 mg p.o. twice daily Wellbutrin XL 300 mg p.o. daily-reduced dosage Hydroxyzine 25 mg p.o. daily as needed for severe anxiety attacks. Continue CBT  Alcohol use disorder in remission Will monitor closely  Tobacco use disorder-improving Will monitor closely  Patient to continue to follow-up with her primary care provider, cardiology, GI specialist for continued management of her medical issues.  We will coordinate care.  Patient advised to continue psychotherapy sessions  with her current therapist given her current situational stressors or establish care with a new therapist.  Patient agrees with it.  Follow-up in clinic in 2 weeks or sooner if needed.  This note was generated in part or whole with voice recognition software. Voice recognition is usually quite accurate but there are transcription errors that can and very often do occur. I apologize for any typographical errors that were not detected and corrected.     Ursula Alert, MD 09/02/2020, 5:55 PM

## 2020-09-04 DIAGNOSIS — D3502 Benign neoplasm of left adrenal gland: Secondary | ICD-10-CM | POA: Insufficient documentation

## 2020-09-04 DIAGNOSIS — K635 Polyp of colon: Secondary | ICD-10-CM | POA: Insufficient documentation

## 2020-09-04 DIAGNOSIS — K838 Other specified diseases of biliary tract: Secondary | ICD-10-CM | POA: Insufficient documentation

## 2020-09-04 DIAGNOSIS — I701 Atherosclerosis of renal artery: Secondary | ICD-10-CM | POA: Insufficient documentation

## 2020-09-04 NOTE — Addendum Note (Signed)
Addended by: Orland Mustard on: 09/04/2020 11:38 PM   Modules accepted: Orders

## 2020-09-05 NOTE — Assessment & Plan Note (Deleted)
04/12/2016 Left breast biopsy/ lumpectomy: IDC grade 10.8 cm with DCIS grade 1, margins negative, ER 100%, PR 100%, HER-2 negative, Ki-67 15%  PET/CT scan: Negative for metastatic disease Sentinel lymph node study: 1/4 sentinel nodes positive with extracapsular extension  Mammaprint low-risk: 10 year risk of recurrence 10% Adjuvant radiation therapy 07/24/2016- 09/07/2016  Treatment plan: Adjuvant antiestrogen therapy with anastrozole 1 mg daily 7 yearsswitched to letrozole December 2018  Letrozole toxicities:Very occasional hot flashes. Denies any major trouble from this.  Abdominal weight: Encouraged her to do abdominal crunches and exercise regularly.  Breast cancer surveillance: 1.Breast exam 09/07/2019: Benign 2.mammogram 06/08/2019: Benign breast density category C 3.CT chest 05/14/2017: Postradiation changes in the anterior left upper lobe  RTC in 1 year 

## 2020-09-05 NOTE — Progress Notes (Signed)
Left message to return call 

## 2020-09-06 ENCOUNTER — Telehealth: Payer: Self-pay | Admitting: Hematology and Oncology

## 2020-09-06 ENCOUNTER — Ambulatory Visit: Payer: PPO | Admitting: Hematology and Oncology

## 2020-09-06 ENCOUNTER — Other Ambulatory Visit: Payer: Self-pay | Admitting: Psychiatry

## 2020-09-06 ENCOUNTER — Telehealth: Payer: Self-pay

## 2020-09-06 DIAGNOSIS — Z170 Estrogen receptor positive status [ER+]: Secondary | ICD-10-CM

## 2020-09-06 DIAGNOSIS — F3341 Major depressive disorder, recurrent, in partial remission: Secondary | ICD-10-CM

## 2020-09-06 NOTE — Telephone Encounter (Signed)
3rd unsuccessful outreach  

## 2020-09-06 NOTE — Telephone Encounter (Signed)
R/s appt per 8/2 sch msg. Called pt, no answer. Left msg with appt date and time.

## 2020-09-06 NOTE — Chronic Care Management (AMB) (Signed)
  Care Management   Note  09/06/2020 Name: [name] MRN: [id number] DOB: 26-Jan-1956  Ashley Cross is a 65 y.o. year old female who is a primary care patient of McLean-Scocuzza, Nino Glow, MD and is actively engaged with the care management team. I reached out to [name] by phone today to assist with re-scheduling a follow up visit with the Pharmacist  Follow up plan: Unable to make contact on outreach attempts x 3. PCP McLean-Scocuzza, Nino Glow, MD notified via routed documentation in medical record.   Noreene Larsson, [place], [place], [place] [address] Direct Dial: [phone] [email or web address] Website: [email or web address]

## 2020-09-06 NOTE — Telephone Encounter (Signed)
Spoke with Patient briefly as she was speaking with [place] and denying EMS help.   Spoke with the Patient and informed her that based off her symptoms we have spoken with Dr Olivia Mackie McLean-Scocuzza and she recommends going to the ED. Patient declined and stated that she is not EMS worthy. States she wanted to stay home and wait to see if she was better by tomorrow.   Informed the Patient that her symptoms could be a multitude of things. Informed her that with slurred speech and extremity weakness she could possibly be having a stroke. Informed her that this is not something she can wait and see on, this could get a lot worse. Advised the Patient that it is important she be evaluated.    Patient then states that she will admit she drank some Gin and juice. I asked the Patient how long ago this had been. Patient states she drank around 8:00 am, at this time it was 10:13 am. Patient then admitted that she took her medications this morning as well. I asked had she drank within the same hour she took her medication. Patient said yes. Patient states that she had a lot going on and did this to try and get control of all of her feelings.   The call was then passed off to [facility].

## 2020-09-06 NOTE — Telephone Encounter (Addendum)
Was called by CMA and advised that patient on phone complaint of bilateral leg weakness and tingling in extremities refusing ER or EMS. Went to speak with patient advised by second CMA patient had taken medication with Gin this morning and orange juice. Took phone introduced myself to patient and ask patient o voice her identification name and date of birth, patient identified according to [facility]. Ask patient which medication had she taken this morning and what time she staed all morning medication with Gin and orange juice ask how much GIN she stated " I think an ounce" words not clear hard to understand , patient "giggled I don't know". I ask patient to explain her symptoms she stated "giggle I can hardly walk my knee buckle." My arms feel ike electrical shocks giggle " I ask patient if she would be willing to go to hospital or could I call EMS she stated "no and started to cry stating the last time she saw her daughter was in the hospital and she died" Patient stated she was going to hang up phone stated no please don't I need to know which medication she took I ask do you take gabapentin in the morning she said "yes" Oxymorphone "yes" Valium "yes" Lamictal patient stated all my medication for the morning. I ask patient could I call EMS just to have them check on her she stated no,.Patient stated I can take my blood pressure myself I ask her to take blood pressure while I spoke with PCP advised PCP of situation was advised [place] ahead call 911, called 911 and advised of situation and advised of medications taken with Alcohol and advised symptoms.

## 2020-09-06 NOTE — Telephone Encounter (Signed)
Pt called in stating that she has fallen multiple time and has been transferred to access nurse. Access nurse called back due to patient refusing to contact the ED. Pt states that she is "weird" today. Pt states that she has felt weird since she woke up and took her dogs out. Pt states that when she bends over she falls down. Pt states that her legs give out. Pt speech is slurred. Pt states that she did exercise on 09/05/20 but is not having any muscle soreness.    Falls started at 6am.  Pt has fallen around 10 times today. Pt states her knees give out when standing and walking. Pt declines going to the ED or calling the ambulance. Pt had an appointment in [place] but cancelled due to issues walking. 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 Pt states her spine hurts up to her neck and she is having bilateral hand numbness. Pt states that "there are electric wires shooting from her back down to her feet". Pt declines facial drooping and checking for any facial drooping. Pt states that she declines calling the Emergency department due to losing her daughter. Pt declines [place] calling the ambulance for her so they can come and check on her at home. Pt states she woke up with slurred speech.   Pt spoke with Yves Dill, CMA while I called on Kerin Salen, RN due to patient refusal to contact EMS. Juliann Pulse Was briefed and spoke to patient after Arianna, CMA.   Spoke to patient again to get her blood pressure. Pt states that she does not understand how to use her blood pressure cuff and has to read the instructions. Pt states that she needs to take some time and can not talk and placed the phone down. The call was disconnected and We called back and spoke again with Caren Griffins.   Pt took her blood pressure and it was 111/86 but she states that it is not correct. Pt did not give an updated reading.   Pt has 3 dogs- a Lab/Boarder [place], an [place] walker. Pt did not mention the third breed but states that they  are in the home and not put away.   Pt disconnected when someone knocked on her door. Could not understand the patient as her dogs started to block out her words.

## 2020-09-06 NOTE — Telephone Encounter (Signed)
Agree with [id number] EMS/police  Dr. Olivia Mackie Mclean-Scocuzza

## 2020-09-06 NOTE — Telephone Encounter (Signed)
Pt called and states that she has fallen six times since 6:30 this morning. She states that she is dizzy and vision is blurry. She denies SOB, headache, or chest pain. Pt states that she is home alone and she does not know why she is falling. I transferred her to [facility] at Access Nurse to triage. I let her know that this office currently has no openings available in office or virtually.

## 2020-09-06 NOTE — Telephone Encounter (Signed)
Beth from Access nurse called stating that the patient has fallen 6 times, is having dizziness,blurred vision and patient refuses to call 911 and wants to see Dr.Tracy.Patient transferred to [facility] [place].

## 2020-09-11 ENCOUNTER — Ambulatory Visit
Admission: RE | Admit: 2020-09-11 | Discharge: 2020-09-11 | Disposition: A | Discharge status: Home / Self Care | Payer: PPO | Source: Ambulatory Visit | Attending: Neurosurgery | Admitting: Neurosurgery

## 2020-09-11 DIAGNOSIS — M961 Postlaminectomy syndrome, not elsewhere classified: Secondary | ICD-10-CM

## 2020-09-11 DIAGNOSIS — M48061 Spinal stenosis, lumbar region without neurogenic claudication: Secondary | ICD-10-CM | POA: Diagnosis not present

## 2020-09-11 DIAGNOSIS — M5416 Radiculopathy, lumbar region: Secondary | ICD-10-CM

## 2020-09-11 DIAGNOSIS — M545 Low back pain, unspecified: Secondary | ICD-10-CM | POA: Diagnosis not present

## 2020-09-11 DIAGNOSIS — M4802 Spinal stenosis, cervical region: Secondary | ICD-10-CM | POA: Diagnosis not present

## 2020-09-13 ENCOUNTER — Telehealth: Payer: Self-pay | Admitting: Internal Medicine

## 2020-09-13 NOTE — Telephone Encounter (Signed)
Patient is calling in to return your call.

## 2020-09-15 DIAGNOSIS — G894 Chronic pain syndrome: Secondary | ICD-10-CM | POA: Diagnosis not present

## 2020-09-15 DIAGNOSIS — R030 Elevated blood-pressure reading, without diagnosis of hypertension: Secondary | ICD-10-CM | POA: Insufficient documentation

## 2020-09-15 DIAGNOSIS — M546 Pain in thoracic spine: Secondary | ICD-10-CM | POA: Diagnosis not present

## 2020-09-15 DIAGNOSIS — M5416 Radiculopathy, lumbar region: Secondary | ICD-10-CM | POA: Diagnosis not present

## 2020-09-15 DIAGNOSIS — M961 Postlaminectomy syndrome, not elsewhere classified: Secondary | ICD-10-CM | POA: Diagnosis not present

## 2020-09-16 ENCOUNTER — Encounter: Payer: Self-pay | Admitting: Psychiatry

## 2020-09-16 ENCOUNTER — Other Ambulatory Visit: Payer: Self-pay

## 2020-09-16 ENCOUNTER — Telehealth (INDEPENDENT_AMBULATORY_CARE_PROVIDER_SITE_OTHER): Payer: PPO | Admitting: Psychiatry

## 2020-09-16 DIAGNOSIS — F339 Major depressive disorder, recurrent, unspecified: Secondary | ICD-10-CM

## 2020-09-16 DIAGNOSIS — F17200 Nicotine dependence, unspecified, uncomplicated: Secondary | ICD-10-CM

## 2020-09-16 DIAGNOSIS — Z91199 Patient's noncompliance with other medical treatment and regimen due to unspecified reason: Secondary | ICD-10-CM | POA: Insufficient documentation

## 2020-09-16 DIAGNOSIS — Z9111 Patient's noncompliance with dietary regimen: Secondary | ICD-10-CM | POA: Diagnosis not present

## 2020-09-16 DIAGNOSIS — F1010 Alcohol abuse, uncomplicated: Secondary | ICD-10-CM | POA: Diagnosis not present

## 2020-09-16 MED ORDER — LAMOTRIGINE 25 MG PO TABS: 25.0000 mg | ORAL_TABLET | ORAL | 1 refills | Status: DC

## 2020-09-16 NOTE — Progress Notes (Signed)
Virtual Visit via Video Note  I connected with Ashley Cross on 09/16/20 at 11:45 AM EDT by a video enabled telemedicine application and verified that I am speaking with the correct person using two identifiers.  Location Provider Location : [facility] Patient Location : Home  Participants: Patient , Provider    I discussed the limitations of evaluation and management by telemedicine and the availability of in person appointments. The patient expressed understanding and agreed to proceed.   I discussed the assessment and treatment plan with the patient. The patient was provided an opportunity to ask questions and all were answered. The patient agreed with the plan and demonstrated an understanding of the instructions.   The patient was advised to call back or seek an in-person evaluation if the symptoms worsen or if the condition fails to improve as anticipated.  Video connection was lost at less than 50% of the duration of the visit, at which time the remainder of the visit was completed through audio only    [facility] MD/PA/NP OP Progress Note  09/16/2020 12:35 PM Elaiya Nosal  MRN:  [id number]  Chief Complaint:  Chief Complaint   Follow-up; Anxiety; Depression    HPI: Ashley Cross is a 65 year old Caucasian female on disability, lives in [place], has a history of MDD, history of breast cancer, chronic pain, hypertension was evaluated by telemedicine today.  Patient today reports she had an incident recently when she felt her knees gave out and she fell, her primary care provider initiated emergency response team who presented along with Sheriff[employer] Department.  She reports that made her very anxious and she decided not to go to the hospital since she did not know what to do with the pets.  She however reports even though she felt better physically, emotionally she struggled the next few days because the [facility] department came into her home.  She is currently feeling  much better.  She continues to have mood swings although the Lamictal is helpful.  Denies side effects.  She reports sleep is good.  She reports she continues to use alcohol usually on weekends 1 to 2 glasses of wine.  She reports she is trying to limit use.  Patient continues to have multiple physical problems as well as has relationship struggles with her son at this time.  She reports she has been noncompliant with psychotherapy sessions since she has been very busy with work.  Patient encouraged to be compliant.  Denies suicidality, homicidality or perceptual disturbances.  Reports she is trying to cut back on smoking cigarettes.  Visit Diagnosis:    ICD-10-CM   1. MDD (major depressive disorder), recurrent episode, with atypical features (HCC)  F33.9 lamoTRIgine (LAMICTAL) 25 MG tablet    2. Alcohol use disorder, mild, abuse  F10.10    In partial remission    3. Tobacco use disorder  F17.200     4. Noncompliance with treatment plan  Z91.11       Past Psychiatric History: Reviewed past psychiatric history from progress note on 09/16/2018.  Past trials of Lexapro, Zoloft, Wellbutrin, Celexa  Past Medical History:  Past Medical History:  Diagnosis Date   Anxiety    Breast cancer ([place])    Left breast(nipple mass) 2018   Cervical stenosis of spine    Chicken pox    Chronic back pain    Colon polyps    COPD (chronic obstructive pulmonary disease) (HCC)    Cyst of left nipple    Depression  Hypertension    Insomnia    Personal history of radiation therapy     Past Surgical History:  Procedure Laterality Date   BACK SURGERY     x 3 (05/23/08 x 2; 05-24-2011 x 1)    BREAST BIOPSY Left    core- neg   BREAST LUMPECTOMY Left 2016-05-23   BREAST SURGERY Left    breast biopsy   CERVICAL CONE BIOPSY     CERVICAL FUSION     times 2   COLONOSCOPY WITH PROPOFOL N/A 04/18/2015   Procedure: COLONOSCOPY WITH PROPOFOL;  Surgeon: [name], MD;  Location: [facility] END[facility]COPY;  Service:  Endoscopy;  Laterality: N/A;   ELBOW SURGERY     May 23, 2012 b/l elbows per pt ulnar nerve    ELBOW SURGERY     x2    EPIDURAL BLOCK INJECTION     Dr. Maryjean Ka    MASS EXCISION Left 04/12/2016   Procedure: EXCISION OF LEFT NIPPLE MASS;  Surgeon: Stark Klein, MD;  Location: [place];  Service: General;  Laterality: Left;   NECK SURGERY     POSTERIOR CERVICAL FUSION/FORAMINOTOMY  03/07/2011   Procedure: POSTERIOR CERVICAL FUSION/FORAMINOTOMY LEVEL 4;  Surgeon: Eustace Moore, MD;  Location: [facility];  Service: Neurosurgery;  Laterality: N/A;  cervical three to thoracic one posterior cervical fusion    SENTINEL NODE BIOPSY Left 05/29/2016   Procedure: LEFT SENTINEL LYMPH NODE BIOPSY;  Surgeon: Stark Klein, MD;  Location: [place];  Service: General;  Laterality: Left;   TONSILLECTOMY      Family Psychiatric History: Reviewed family psychiatric history from progress note on 09/16/2018  Family History:  Family History  Problem Relation Age of Onset   Breast cancer Mother 17   Hypertension Mother    Cancer Mother        breast dx'ed 9    Cancer Father        prostate   Hypertension Father    Drug abuse Daughter    Hypertension Maternal Grandmother    Breast cancer Cousin     Social History: Reviewed social history from progress note on 09/16/2018 Social History   Socioeconomic History   Marital status: Divorced    Spouse name: Not on file   Number of children: 2   Years of education: Not on file   Highest education level: Not on file  Occupational History   Not on file  Tobacco Use   Smoking status: Every Day    Packs/day: 0.25    Years: 30.00    Pack years: 7.50    Types: Cigarettes   Smokeless tobacco: Never   Tobacco comments:    work days - 8 cigarettes; weekends - ~10   Vaping Use   Vaping Use: Every day  Substance and Sexual Activity   Alcohol use: Yes    Comment: social   Drug use: No    Comment: on opana since jan 2018   Sexual activity: Not on  file  Other Topics Concern   Not on file  Social History Narrative   As of 01/25/17 not sexually active of in relationship    Divorced   2 kids son and daughter (though daughter died of suicide in 2017-05-23)   Son lives in [place] now as of 12/2018       Disability    Loves animals has 3 dogs           Social Determinants of Radio broadcast assistant Strain:  Low Risk    Difficulty of Paying Living Expenses: Not hard at all  Food Insecurity: No Food Insecurity   Worried About Running Out of Food in the Last Year: Never true   Ran Out of Food in the Last Year: Never true  Transportation Needs: No Transportation Needs   Lack of Transportation (Medical): No   Lack of Transportation (Non-Medical): No  Physical Activity: Not on file  Stress: Stress Concern Present   Feeling of Stress : Rather much  Social Connections: Unknown   Frequency of Communication with Friends and Family: More than three times a week   Frequency of Social Gatherings with Friends and Family: Not on file   Attends Religious Services: Not on file   Active Member of Clubs or Organizations: Not on file   Attends Archivist Meetings: Not on file   Marital Status: Not on file    Allergies:  Allergies  Allergen Reactions   Anastrozole    Augmentin [Amoxicillin-Pot Clavulanate]     Diarrhea    Norvasc [Amlodipine]     Swelling    Topamax [Topiramate]     Didn't like the way made feel more irritatble/sad    Sulfa Antibiotics Itching and Rash   Sulfonamide Derivatives Itching and Rash    Metabolic Disorder Labs: Lab Results  Component Value Date   HGBA1C 5.4 04/28/2020   No results found for: PROLACTIN Lab Results  Component Value Date   CHOL 186 04/28/2020   TRIG 73.0 04/28/2020   HDL 95.00 04/28/2020   CHOLHDL 2 04/28/2020   VLDL 14.6 04/28/2020   LDLCALC 76 04/28/2020   LDLCALC 85 08/25/2019   Lab Results  Component Value Date   TSH 0.71 08/25/2019   TSH 2.91 04/03/2019     Therapeutic Level Labs: No results found for: LITHIUM No results found for: VALPROATE No components found for:  CBMZ  Current Medications: Current Outpatient Medications  Medication Sig Dispense Refill   albuterol (VENTOLIN HFA) 108 (90 Base) MCG/ACT inhaler Inhale 1-2 puffs into the lungs every 4 (four) hours as needed for wheezing or shortness of breath. 18 g 12   azelastine (ASTELIN) 0.1 % nasal spray      buPROPion (WELLBUTRIN XL) 300 MG 24 hr tablet Take 1 tablet (300 mg total) by mouth daily. 90 tablet 1   cyclobenzaprine (FLEXERIL) 10 MG tablet Take 10 mg by mouth every 8 (eight) hours as needed.     diazepam (VALIUM) 5 MG tablet SMARTSIG:1 Tablet(s) By Mouth (Patient not taking: Reported on 08/05/2020)     doxycycline (VIBRA-TABS) 100 MG tablet Take 1 tablet (100 mg total) by mouth 2 (two) times daily. For food 20 tablet 0   DULoxetine (CYMBALTA) 60 MG capsule TAKE 1 CAPSULE BY MOUTH TWICE DAILY 60 capsule 1   fluticasone (FLONASE) 50 MCG/ACT nasal spray TAKE 2 SPRAYS INTO BOTH NOSTRILS DAILY 16 g 11   furosemide (LASIX) 40 MG tablet Take 1 tablet (40 mg total) by mouth daily. 90 tablet 3   gabapentin (NEURONTIN) 600 MG tablet Take 1,200 mg by mouth 3 (three) times daily.     hydrOXYzine (VISTARIL) 25 MG capsule TAKE ONE CAPSULE EVERY EIGHT HOURS AS NEEDED FOR ANXIETY 90 capsule 1   ipratropium (ATROVENT) 0.06 % nasal spray PLACE 2 SPRAYS INTO BOTH NOSTRILS 3 TIMES DAILY AS NEEDED 15 mL 12   lamoTRIgine (LAMICTAL) 25 MG tablet Take 1 tablet (25 mg total) by mouth as directed. Start taking 1 tablet AM and 2  tablets PM 90 tablet 1   letrozole (FEMARA) 2.5 MG tablet TAKE ONE TABLET BY MOUTH EVERY DAY 90 tablet 0   metoprolol succinate (TOPROL-XL) 25 MG 24 hr tablet Take 1 tablet (25 mg total) by mouth daily. 90 tablet 3   MODERNA COVID-19 VACCINE 100 MCG/0.5ML injection  (Patient not taking: Reported on 08/05/2020)     montelukast (SINGULAIR) 10 MG tablet Take 1 tablet (10 mg total) by  mouth at bedtime. 90 tablet 3   Multiple Vitamins-Minerals (MULTIVITAMIN) tablet Take 1 tablet by mouth daily. 90 tablet 3   mupirocin ointment (BACTROBAN) 2 % Apply 1 application topically 2 (two) times daily. 30 g 0   oxymorphone (OPANA) 10 MG tablet Take 10 mg by mouth 2 (two) times daily.     SENEXON-S 8.6-50 MG tablet TAKE 1-2 TABLETS BY MOUTH DAILY AS NEEDED FOR MILD CONSTIPATION. 180 tablet 1   telmisartan (MICARDIS) 80 MG tablet Take 1 tablet (80 mg total) by mouth daily. 90 tablet 3   Tiotropium Bromide-Olodaterol (STIOLTO RESPIMAT) 2.5-2.5 MCG/ACT AERS Inhale 2 puffs into the lungs daily. 4 g 11   No current facility-administered medications for this visit.     Musculoskeletal: Strength & Muscle Tone:  UTA Gait & Station: UTA Patient leans: N/A  Psychiatric Specialty Exam: Review of Systems  Musculoskeletal:  Positive for arthralgias and back pain.  Psychiatric/Behavioral:  The patient is nervous/anxious.        Mood swings  All other systems reviewed and are negative. Mood swings  There were no vitals taken for this visit.There is no height or weight on file to calculate BMI.  General Appearance: UTA  Eye Contact:  UTA  Speech:  Clear and Coherent  Volume:  Normal  Mood:  Anxious ,mood swings  Affect:  UTA  Thought Process:  Goal Directed and Descriptions of Associations: Intact  Orientation:  Full (Time, Place, and Person)  Thought Content: Logical   Suicidal Thoughts:  No  Homicidal Thoughts:  No  Memory:  Immediate;   Fair Recent;   Fair Remote;   Fair  Judgement:  Fair  Insight:  Fair  Psychomotor Activity:  UTA  Concentration:  Concentration: Fair and Attention Span: Fair  Recall:  [employer] of Knowledge: Fair  Language: Fair  Akathisia:  No  Handed:  Right  AIMS (if indicated): done  Assets:  Communication Skills Desire for [place] Talents/Skills  ADL's:  Intact  Cognition: WNL  Sleep:  Fair   Screenings: GAD-7     [place] Office Visit from 06/12/2019 in [place] Office Visit from 09/30/2017 in [facility]  Total GAD-7 Score 18 18      PHQ2-9    [place] Video Visit from 06/29/2020 in [place] Video Visit from 06/02/2020 in [place] Video Visit from 05/09/2020 in [place] from 04/12/2020 in [place] Video Visit from 04/11/2020 in [place]  PHQ-2 Total Score 0 '2 1 2 2  '$ PHQ-9 Total Score '4 7 12 13 13      '$ Flowsheet Row Video Visit from 09/16/2020 in [place] from 06/06/2020 in [place] from 05/10/2020 in [place] No Risk No Risk No Risk        Assessment and Plan: Safari Fausey is a 65 year old Caucasian female, divorced, disabled, has a history of  depression, chronic pain, hypertension, history of breast cancer was evaluated by telemedicine today.  Patient today continues to have mood swings, has multiple psychosocial stressors including relationship struggles, medical problems.  Discussed plan as noted below.  Plan MDD-unstable Increase Lamictal to 25 mg p.o. daily in the morning and 50 mg every afternoon Cymbalta 60 mg p.o. twice daily Wellbutrin XL 300 mg p.o. daily-reduced dosage Hydroxyzine 25 mg p.o. daily as needed for severe anxiety attacks Continue CBT   Alcohol use disorder in partial  remission Will monitor closely-provided counseling  Tobacco use disorder-improving We will monitor closely-provided counseling for 3 minutes  Noncompliance with treatment plan-provided education.  Patient encouraged to have regular follow-up with her therapist.  Follow-up in clinic in 3 weeks or sooner if needed.   I have spent at least 20  minutes non face to face with patient today.  This note was generated in part or whole with voice recognition software. Voice recognition is usually quite accurate but there are transcription errors that can and very often do occur. I apologize for any typographical errors that were not detected and corrected.    Ursula Alert, MD 09/16/2020, 12:35 PM

## 2020-09-19 NOTE — Progress Notes (Signed)
HEMATOLOGY-ONCOLOGY TELEPHONE VISIT PROGRESS NOTE  I connected with Ashley Cross on 09/20/2020 at 11:45 AM EDT by telephone and verified that I am speaking with the correct person using two identifiers.  I discussed the limitations, risks, security and privacy concerns of performing an evaluation and management service by telephone and the availability of in person appointments.  I also discussed with the patient that there may be a patient responsible charge related to this service. The patient expressed understanding and agreed to proceed.   History of Present Illness: Ashley Cross is a 65 y.o. female with above-mentioned history of left breast cancer treated with lumpectomy, radiation, and who is currently on anti-estrogen therapy with letrozole. Mammogram on 05/18/20 showed no evidence of malignancy bilaterally. She presents via telephone today for annual follow-up. She had an incident when her knees were giving up and EMS was called and she refused to go to the hospital. She recovered spontaneously in a few days (? CVA)  Oncology History  Malignant neoplasm of lower-outer quadrant of left breast of female, estrogen receptor positive ([place])  04/12/2016 Initial Diagnosis   Left breast biopsy/ lumpectomy: IDC grade 1, 0.8 cm with DCIS grade 1, margins negative, ER 100%, PR 100%, HER-2 negative, Ki-67 15%, T1b NX stage I a   05/21/2016 PET scan   Mild focal hypermetabolic in the left breast, no findings of metastatic disease. mild hypermetabolism is along the right costosternal junction favoring degenerative changes posttraumatic changes involving the right inferior scapular right sacrum bilateral pelvis and right lateral third and fourth ribs   05/29/2016 Surgery   1/4 sentinel lymph node positive with extracapsular extension    06/01/2016 Miscellaneous   Mammaprint low-risk: Average 10 year risk of recurrence 10%; Mammaprint index +0.389   07/24/2016 - 09/07/2016 Radiation Therapy    Adjuvant radiation therapy   09/2016 -  Anti-estrogen oral therapy   Anastrozole, changed to Letrozole after 1-2 months (patient unable to tolerate Anastrozole)     Observations/Objective:     Assessment Plan:  Malignant neoplasm of lower-outer quadrant of left breast of female, estrogen receptor positive ([place]) 04/12/2016 Left breast biopsy/ lumpectomy: IDC grade 10.8 cm with DCIS grade 1, margins negative, ER 100%, PR 100%, HER-2 negative, Ki-67 15%   PET/CT scan: Negative for metastatic disease Sentinel lymph node study: 1/4 sentinel nodes positive with extracapsular extension   Mammaprint low-risk: 10 year risk of recurrence 10% Adjuvant radiation therapy 07/24/2016- 09/07/2016   Treatment plan: Adjuvant antiestrogen therapy with anastrozole 1 mg daily 7 years switched to letrozole December 2018   Letrozole toxicities: Very occasional hot flashes.  Denies any major trouble from this.   Abdominal weight: Encouraged her to do abdominal crunches and exercise regularly.   Breast cancer surveillance: 1.  Breast exam  Not done 2. mammogram  05/18/20: Palpable left axillary lump: Left mammogram and U/S: Unremarkable. Needs Bilateral mammograms. breast density category C  3. MRI Lumbar spine: Multi level degenerative changes   Her daughter passed away in 04/08/17 and she is still grieving from that. Lower ext weakness: Transient, she recovered completely  Return to clinic in  1 year for follow-up  I discussed the assessment and treatment plan with the patient. The patient was provided an opportunity to ask questions and all were answered. The patient agreed with the plan and demonstrated an understanding of the instructions. The patient was advised to call back or seek an in-person evaluation if the symptoms worsen or if the condition fails to improve as  anticipated.   Total time spent: 11 mins including non-face to face time and time spent for planning, charting and coordination of  care  Rulon Eisenmenger, MD 09/20/2020    I, Thana Ates, am acting as scribe for Nicholas Lose, MD.  I have reviewed the above documentation for accuracy and completeness, and I agree with the above.

## 2020-09-20 ENCOUNTER — Inpatient Hospital Stay: Payer: PPO | Attending: Hematology and Oncology | Admitting: Hematology and Oncology

## 2020-09-20 DIAGNOSIS — C50512 Malignant neoplasm of lower-outer quadrant of left female breast: Secondary | ICD-10-CM

## 2020-09-20 DIAGNOSIS — Z170 Estrogen receptor positive status [ER+]: Secondary | ICD-10-CM | POA: Diagnosis not present

## 2020-09-20 MED ORDER — LETROZOLE 2.5 MG PO TABS: 2.5000 mg | ORAL_TABLET | Freq: Every day | ORAL | 3 refills | Status: DC

## 2020-09-20 NOTE — Assessment & Plan Note (Signed)
04/12/2016 Left breast biopsy/ lumpectomy: IDC grade 10.8 cm with DCIS grade 1, margins negative, ER 100%, PR 100%, HER-2 negative, Ki-67 15%  PET/CT scan: Negative for metastatic disease Sentinel lymph node study: 1/4 sentinel nodes positive with extracapsular extension  Mammaprint low-risk: 10 year risk of recurrence 10% Adjuvant radiation therapy 07/24/2016- 09/07/2016  Treatment plan: Adjuvant antiestrogen therapy with anastrozole 1 mg daily 7 yearsswitched to letrozole December 2018  Letrozole toxicities:Very occasional hot flashes. Denies any major trouble from this.  Abdominal weight: Encouraged her to do abdominal crunches and exercise regularly.  Breast cancer surveillance: 1.Breast exam 09/20/2020: Benign  2.mammogram 05/18/20: Palpable left axillary lump: Left mammogram and U/S: Unremarkable. Needs Bilateral mammograms. breast density category C 3.MRI Lumbar spine: Multi level degenerative changes  Her daughter passed away in April 13, 2017 and she is still grieving from that.  Return to clinic in1 year for follow-up

## 2020-09-21 ENCOUNTER — Telehealth: Payer: Self-pay | Admitting: Hematology and Oncology

## 2020-09-21 NOTE — Telephone Encounter (Signed)
Scheduled appt per 8/16 los - 1 year f/u - mailed reminder letter with appt date and time

## 2020-09-22 NOTE — Telephone Encounter (Signed)
Pt notified of results

## 2020-09-23 ENCOUNTER — Other Ambulatory Visit (INDEPENDENT_AMBULATORY_CARE_PROVIDER_SITE_OTHER): Payer: Self-pay | Admitting: Vascular Surgery

## 2020-09-23 DIAGNOSIS — I701 Atherosclerosis of renal artery: Secondary | ICD-10-CM

## 2020-09-28 ENCOUNTER — Other Ambulatory Visit: Payer: Self-pay | Admitting: Psychiatry

## 2020-09-28 DIAGNOSIS — F331 Major depressive disorder, recurrent, moderate: Secondary | ICD-10-CM

## 2020-10-03 ENCOUNTER — Encounter (INDEPENDENT_AMBULATORY_CARE_PROVIDER_SITE_OTHER): Payer: Self-pay | Admitting: Vascular Surgery

## 2020-10-03 ENCOUNTER — Other Ambulatory Visit: Payer: Self-pay

## 2020-10-03 ENCOUNTER — Ambulatory Visit (INDEPENDENT_AMBULATORY_CARE_PROVIDER_SITE_OTHER): Payer: PPO

## 2020-10-03 ENCOUNTER — Ambulatory Visit (INDEPENDENT_AMBULATORY_CARE_PROVIDER_SITE_OTHER): Payer: PPO | Admitting: Vascular Surgery

## 2020-10-03 VITALS — BP 143/73 | HR 84 | Resp 15 | Ht 64.0 in | Wt 129.0 lb

## 2020-10-03 DIAGNOSIS — I701 Atherosclerosis of renal artery: Secondary | ICD-10-CM | POA: Diagnosis not present

## 2020-10-03 DIAGNOSIS — I10 Essential (primary) hypertension: Secondary | ICD-10-CM

## 2020-10-03 DIAGNOSIS — E785 Hyperlipidemia, unspecified: Secondary | ICD-10-CM | POA: Diagnosis not present

## 2020-10-03 DIAGNOSIS — J439 Emphysema, unspecified: Secondary | ICD-10-CM | POA: Diagnosis not present

## 2020-10-03 DIAGNOSIS — R0989 Other specified symptoms and signs involving the circulatory and respiratory systems: Secondary | ICD-10-CM

## 2020-10-03 NOTE — Progress Notes (Signed)
MRN : [id number]  Ashley Cross is a 65 y.o. (29-Sep-1955) female who presents with chief complaint of kidney problems.  History of Present Illness:   The patient is seen for evaluation of malignant hypertension which has been very difficult to control. The patient has a long history of hypertension which recently has become somewhat difficult to control utilizing medical therapy. Today her BP is acceptable.  Recent CT scan shows moderate to severe plaque of the left renal artery  The patient does have family history of hypertension.   There is no prior documented abdominal bruit. The patient occasionally has flushing symptoms but denies palpitations. No <50% episodes of syncope.There is no history of headache. There is no history of flash pulmonary edema.  The patient denies a history of renal disease.  The patient denies amaurosis fugax or recent TIA symptoms. There are no recent neurological changes noted. The patient denies claudication symptoms or rest pain symptoms. The patient denies history of DVT, PE or superficial thrombophlebitis. The patient denies recent episodes of angina or shortness of breath.   Duplex ultrasound of the renal arteries demonstrates <50% bilateral stenosis   Current Meds  Medication Sig   albuterol (VENTOLIN HFA) 108 (90 Base) MCG/ACT inhaler Inhale 1-2 puffs into the lungs every 4 (four) hours as needed for wheezing or shortness of breath.   azelastine (ASTELIN) 0.1 % nasal spray    buPROPion (WELLBUTRIN XL) 300 MG 24 hr tablet Take 1 tablet (300 mg total) by mouth daily.   cyclobenzaprine (FLEXERIL) 10 MG tablet Take 10 mg by mouth every 8 (eight) hours as needed.   doxycycline (VIBRA-TABS) 100 MG tablet Take 1 tablet (100 mg total) by mouth 2 (two) times daily. For food   DULoxetine (CYMBALTA) 60 MG capsule TAKE 1 CAPSULE BY MOUTH TWICE DAILY   fluticasone (FLONASE) 50 MCG/ACT nasal spray TAKE 2 SPRAYS INTO BOTH NOSTRILS DAILY   furosemide  (LASIX) 40 MG tablet Take 1 tablet (40 mg total) by mouth daily.   gabapentin (NEURONTIN) 600 MG tablet Take 1,200 mg by mouth 3 (three) times daily.   hydrOXYzine (VISTARIL) 25 MG capsule TAKE ONE CAPSULE EVERY EIGHT HOURS AS NEEDED FOR ANXIETY   ipratropium (ATROVENT) 0.06 % nasal spray PLACE 2 SPRAYS INTO BOTH NOSTRILS 3 TIMES DAILY AS NEEDED   lamoTRIgine (LAMICTAL) 25 MG tablet Take 1 tablet (25 mg total) by mouth as directed. Start taking 1 tablet AM and 2 tablets PM   letrozole (FEMARA) 2.5 MG tablet Take 1 tablet (2.5 mg total) by mouth daily.   metoprolol succinate (TOPROL-XL) 25 MG 24 hr tablet Take 1 tablet (25 mg total) by mouth daily.   montelukast (SINGULAIR) 10 MG tablet Take 1 tablet (10 mg total) by mouth at bedtime.   Multiple Vitamins-Minerals (MULTIVITAMIN) tablet Take 1 tablet by mouth daily.   mupirocin ointment (BACTROBAN) 2 % Apply 1 application topically 2 (two) times daily.   oxymorphone (OPANA) 10 MG tablet Take 10 mg by mouth 2 (two) times daily.   SENEXON-S 8.6-50 MG tablet TAKE 1-2 TABLETS BY MOUTH DAILY AS NEEDED FOR MILD CONSTIPATION.   telmisartan (MICARDIS) 80 MG tablet Take 1 tablet (80 mg total) by mouth daily.   Tiotropium Bromide-Olodaterol (STIOLTO RESPIMAT) 2.5-2.5 MCG/ACT AERS Inhale 2 puffs into the lungs daily.    Past Medical History:  Diagnosis Date   Anxiety    Breast cancer ([place])    Left breast(nipple mass) 2018   Cervical stenosis of spine  Chicken pox    Chronic back pain    Colon polyps    COPD (chronic obstructive pulmonary disease) (HCC)    Cyst of left nipple    Depression    Hypertension    Insomnia    Personal history of radiation therapy     Past Surgical History:  Procedure Laterality Date   BACK SURGERY     x 3 (2010 x 2; 2013 x 1)    BREAST BIOPSY Left    core- neg   BREAST LUMPECTOMY Left 2018   BREAST SURGERY Left    breast biopsy   CERVICAL CONE BIOPSY     CERVICAL FUSION     times 2   COLONOSCOPY WITH  PROPOFOL N/A 04/18/2015   Procedure: COLONOSCOPY WITH PROPOFOL;  Surgeon: [name], MD;  Location: [facility];  Service: Endoscopy;  Laterality: N/A;   ELBOW SURGERY     2014 b/l elbows per pt ulnar nerve    ELBOW SURGERY     x2    EPIDURAL BLOCK INJECTION     Dr. Maryjean Ka    MASS EXCISION Left 04/12/2016   Procedure: EXCISION OF LEFT NIPPLE MASS;  Surgeon: Stark Klein, MD;  Location: [place];  Service: General;  Laterality: Left;   NECK SURGERY     POSTERIOR CERVICAL FUSION/FORAMINOTOMY  03/07/2011   Procedure: POSTERIOR CERVICAL FUSION/FORAMINOTOMY LEVEL 4;  Surgeon: Eustace Moore, MD;  Location: [facility];  Service: Neurosurgery;  Laterality: N/A;  cervical three to thoracic one posterior cervical fusion    SENTINEL NODE BIOPSY Left 05/29/2016   Procedure: LEFT SENTINEL LYMPH NODE BIOPSY;  Surgeon: Stark Klein, MD;  Location: [place];  Service: General;  Laterality: Left;   TONSILLECTOMY      Social History Social History   Tobacco Use   Smoking status: Every Day    Packs/day: 0.25    Years: 30.00    Pack years: 7.50    Types: Cigarettes   Smokeless tobacco: Never   Tobacco comments:    work days - 8 cigarettes; weekends - ~10   Vaping Use   Vaping Use: Every day  Substance Use Topics   Alcohol use: Yes    Comment: social   Drug use: No    Comment: on opana since jan 2018    Family History Family History  Problem Relation Age of Onset   Breast cancer Mother 11   Hypertension Mother    Cancer Mother        breast dx'ed 64    Cancer Father        prostate   Hypertension Father    Drug abuse Daughter    Hypertension Maternal Grandmother    Breast cancer Cousin     Allergies  Allergen Reactions   Anastrozole    Augmentin [Amoxicillin-Pot Clavulanate]     Diarrhea    Norvasc [Amlodipine]     Swelling    Topamax [Topiramate]     Didn't like the way made feel more irritatble/sad    Sulfa Antibiotics Itching and Rash    Sulfonamide Derivatives Itching and Rash     REVIEW OF SYSTEMS (Negative unless checked)  Constitutional: '[]'$ Weight loss  '[]'$ Fever  '[]'$ Chills Cardiac: '[]'$ Chest pain   '[]'$ Chest pressure   '[]'$ Palpitations   '[]'$ Shortness of breath when laying flat   '[]'$ Shortness of breath with exertion. Vascular:  '[]'$ Pain in legs with walking   '[]'$ Pain in legs at rest  '[]'$ History of DVT   '[]'$ Phlebitis   '[]'$   Swelling in legs   '[]'$ Varicose veins   '[]'$ Non-healing ulcers Pulmonary:   '[]'$ Uses home oxygen   '[]'$ Productive cough   '[]'$ Hemoptysis   '[]'$ Wheeze  '[]'$ COPD   '[]'$ Asthma Neurologic:  '[]'$ Dizziness   '[]'$ Seizures   '[]'$ History of stroke   '[]'$ History of TIA  '[]'$ Aphasia   '[]'$ Vissual changes   '[]'$ Weakness or numbness in arm   '[]'$ Weakness or numbness in leg Musculoskeletal:   '[]'$ Joint swelling   '[]'$ Joint pain   '[]'$ Low back pain Hematologic:  '[]'$ Easy bruising  '[]'$ Easy bleeding   '[]'$ Hypercoagulable state   '[]'$ Anemic Gastrointestinal:  '[]'$ Diarrhea   '[]'$ Vomiting  '[]'$ Gastroesophageal reflux/heartburn   '[]'$ Difficulty swallowing. Genitourinary:  '[]'$ Chronic kidney disease   '[]'$ Difficult urination  '[]'$ Frequent urination   '[]'$ Blood in urine Skin:  '[]'$ Rashes   '[]'$ Ulcers  Psychological:  '[]'$ History of anxiety   '[]'$  History of major depression.  Physical Examination  Vitals:   10/03/20 0834  BP: (!) 143/73  Pulse: 84  Resp: 15  Weight: 129 lb (58.5 kg)  Height: '5\' 4"'$  (1.626 m)   Body mass index is 22.14 kg/m. Gen: WD/WN, NAD Head: [place]/AT, No temporalis wasting.  Ear/Nose/Throat: Hearing grossly intact, nares w/o erythema or drainage Eyes: PER, EOMI, sclera nonicteric.  Neck: Supple, no masses.  No bruit or JVD.  Pulmonary:  Good air movement, no audible wheezing, no use of accessory muscles.  Cardiac: RRR, normal S1, S2, no Murmurs. Vascular:    bilateral carotid bruits; no abdominal bruits Vessel Right Left  Radial Palpable Palpable  Carotid Palpable Palpable  PT Palpable Palpable  DP Palpable Palpable  Gastrointestinal: soft, non-distended. No guarding/no  peritoneal signs.  Musculoskeletal: M/S 5/5 throughout.  No visible deformity.  Neurologic: CN 2-12 intact. Pain and light touch intact in extremities.  Symmetrical.  Speech is fluent. Motor exam as listed above. Psychiatric: Judgment intact, Mood & affect appropriate for pt's clinical situation. Dermatologic: No rashes or ulcers noted.  No changes consistent with cellulitis.   CBC Lab Results  Component Value Date   WBC 5.4 04/28/2020   HGB 12.4 04/28/2020   HCT 36.9 04/28/2020   MCV 93.4 04/28/2020   PLT 256.0 04/28/2020    BMET    Component Value Date/Time   NA 136 08/05/2020 1656   K 4.3 08/05/2020 1656   CL 99 08/05/2020 1656   CO2 25 08/05/2020 1656   GLUCOSE 82 08/05/2020 1656   BUN 9 08/05/2020 1656   CREATININE 0.73 08/05/2020 1656   CALCIUM 9.0 08/05/2020 1656   GFRNONAA >60 02/13/2017 0936   GFRAA >60 02/13/2017 0936   CrCl cannot be calculated (Patient's most recent lab result is older than the maximum 21 days allowed.).  COAG Lab Results  Component Value Date   INR 1.07 01/29/2016   INR 0.99 02/28/2011   INR 1.0 10/21/2008    Radiology MR CERVICAL SPINE WO CONTRAST  Result Date: 09/12/2020 CLINICAL DATA:  Post-laminectomy syndrome, cervical region EXAM: MRI CERVICAL SPINE WITHOUT CONTRAST TECHNIQUE: Multiplanar, multisequence MR imaging of the cervical spine was performed. No intravenous contrast was administered. COMPARISON:  September 2019 FINDINGS: Alignment: No significant listhesis. Vertebrae: Postoperative changes are again identified including anterior fusion at C5-C6 with plate and screw fixation and interbody graft and posterior fusion at C3-T1 with rods and screws. The hardware is not well evaluated on this study and there is associated susceptibility artifact. Degenerative marrow edema is present at T1-T2. Cord: Abnormal T2 hyperintensity from approximately C2-C3 to C6-C7 with volume loss consistent with myelomalacia. Posterior Fossa, vertebral  arteries, paraspinal  tissues: Unremarkable apart from postoperative changes. Disc levels: C2-C3: Shallow central disc protrusion with annular fissure. Uncovertebral and facet hypertrophy. No significant canal or foraminal stenosis. C3-C4: Disc bulge with endplate osteophytes and uncovertebral and facet hypertrophy. Minor canal stenosis. Mild foraminal stenosis. C4-C5: Bridging bone across disc space. Endplate spurring. No canal or foraminal stenosis. C5-C6: Fusion across disc space. Endplate spurring. No canal or foraminal stenosis. C6-C7: Bridging bone across the disc space. Endplate spurring. No canal or foraminal stenosis. C7-T1:  Endplate osteophytes.  No canal or foraminal stenosis. T1-T2: Disc bulge. Central disc extrusion extending slightly above and below disc level. Left foraminal protrusion. Increased moderate canal stenosis. Increased mild to moderate foraminal stenosis. T2-T3: Imaged in the sagittal plane only. Disc bulge eccentric to the right. No canal or left foraminal stenosis. Mild to moderate right foraminal stenosis. IMPRESSION: Degenerative and postoperative changes as detailed above. Cervical levels are similar to the prior study. There is increased canal and foraminal stenosis at T1-T2. Abnormal cervical cord signal consistent with myelomalacia. Electronically Signed   By: Macy Mis M.D.   On: 09/12/2020 08:39   MR LUMBAR SPINE WO CONTRAST  Result Date: 09/12/2020 CLINICAL DATA:  Lumbar radiculopathy, pain radiating to lower extremities EXAM: MRI LUMBAR SPINE WITHOUT CONTRAST TECHNIQUE: Multiplanar, multisequence MR imaging of the lumbar spine was performed. No intravenous contrast was administered. COMPARISON:  CT abdomen/pelvis 08/26/2020, lumbar spine radiographs 07/28/2020, lumbar spine 11/09/2012 FINDINGS: Segmentation:  Standard. Alignment: There is S-shaped scoliosis of the thoracolumbar spine with apices at T11 and L4, overall similar to the prior radiographs. There is no  anterior or retrolisthesis. Vertebrae: There is indentation of the superior T11 endplate with mild anterior wedge deformity, unchanged compared to the prior CT abdomen/pelvis. There is mild associated edema in the surrounding marrow. There is also degenerative endplate signal abnormality at T11-T12, L3-L4, and L4-L5. There is multilevel facet arthropathy throughout the lumbar spine, most advanced at L5-S1. There is mild STIR signal abnormality in the posterior elements on the left at T12-L1, on the right at L3-L4, and bilaterally at L5-S1 with more focal edema in the right sacral ala. No fracture line is identified. Conus medullaris and cauda equina: Conus extends to the mid L1 level. Conus and cauda equina appear normal. Paraspinal and other soft tissues: There is mild perifacet soft tissue edema bilaterally at L5-S1. The common bile duct is prominent measuring up to 1.0 cm, similar to the prior CT abdomen/pelvis. No filling defect is seen. A 1.6 cm left adrenal lesion is again seen, unchanged since at least 2017. The soft tissues are otherwise unremarkable. Disc levels: There is multilevel disc desiccation and narrowing, most advanced at L3-L4 and L4-L5. T12-L1: There is a mild disc bulge and bilateral facet arthropathy without significant spinal canal or neural foraminal stenosis. L1-L2: There is a diffuse disc bulge with a small superimposed right paracentral protrusion and bilateral facet arthropathy resulting in mild spinal canal stenosis with mild crowding of the right subarticular zone and mild right and no significant left neural foraminal stenosis. L2-L3: There is a mild diffuse disc bulge and bilateral facet arthropathy resulting in mild spinal canal stenosis and mild bilateral neural foraminal stenosis. L3-L4: There is a diffuse disc bulge, degenerative endplate change, and bilateral facet arthropathy resulting in mild spinal canal stenosis with crowding of the bilateral subarticular zones without  evidence of nerve root impingement, and mild bilateral neural foraminal stenosis. There is possible contact without evidence of displacement of the extraforaminal right L3 nerve root. L4-L5:  There is a diffuse disc bulge, ligamentum flavum thickening, and bilateral facet arthropathy resulting in mild-to-moderate spinal canal stenosis with crowding of the subarticular zones without definite evidence of nerve root impingement and mild to moderate right and moderate left neural foraminal stenosis. L5-S1: There is a mild disc bulge the with a left foraminal component, degenerative endplate change, and bilateral facet arthropathy resulting in mild spinal canal stenosis and mild left worse than right neural foraminal stenosis. IMPRESSION: 1. Multilevel degenerative changes throughout the lumbar spine which result in mild spinal canal stenosis and mild bilateral neural foraminal stenosis at L3-L4 with possible contact of the extraforaminal right L3 nerve root, and mild to moderate spinal canal stenosis and moderate left neural foraminal stenosis at L4-L5. Varying degrees of spinal canal and neural foraminal stenosis at the remaining levels as described above. 2. Multilevel facet arthropathy, most advanced at L5-S1 with associated right worse than left reactive marrow edema in the posterior elements and mild perifacetal soft tissue edema at this level. Reactive marrow edema is also seen in the posterior elements at T12-L1 and L3-L4 as above. 3. Indentation of the superior T11 endplate with mild anterior wedge deformity likely reflecting a prominent Schmorl's node with mild surrounding marrow edema. 4. Unchanged left adrenal lesion going back to at least 2017. Electronically Signed   By: Valetta Mole MD   On: 09/12/2020 09:00     Assessment/Plan 1. Atherosclerosis of renal artery ([place]) Given patient's arterial disease optimal control of the patient's hypertension is important. BP is acceptable today.    The patient's  vital signs and noninvasive studies support the renal artery stenosis is not likely hemodynamically significant at this time.  No invasive studies or intervention is indicated at this time.  We will continue to follow with a duplex ultrasound in 6 months.  The patient will continue the current antihypertensive medications, no changes at this time.  The primary medical service will continue aggressive antihypertensive therapy as per the AHA guidelines.     - VAS US RENAL ARTERY DUPLEX; Future  2. Bilateral carotid bruits Recommend:  Given the patient's asymptomatic subcritical stenosis no further invasive testing or surgery at this time.  Continue antiplatelet therapy as prescribed Continue management of CAD, HTN and Hyperlipidemia Healthy heart diet,  encouraged exercise at least 4 times per week Follow up in 6 months with duplex ultrasound and physical exam.    - VAS US CAROTID; Future  3. Primary hypertension Continue antihypertensive medications as already ordered, these medications have been reviewed and there are no changes at this time.   4. Pulmonary emphysema, unspecified emphysema type ([place]) Continue pulmonary medications and aerosols as already ordered, these medications have been reviewed and there are no changes at this time.    5. Hyperlipidemia, unspecified hyperlipidemia type Continue statin as ordered and reviewed, no changes at this time     Hortencia Pilar, MD  10/03/2020 8:42 AM

## 2020-10-06 ENCOUNTER — Encounter (INDEPENDENT_AMBULATORY_CARE_PROVIDER_SITE_OTHER): Payer: Self-pay | Admitting: Vascular Surgery

## 2020-10-06 DIAGNOSIS — R0989 Other specified symptoms and signs involving the circulatory and respiratory systems: Secondary | ICD-10-CM | POA: Insufficient documentation

## 2020-10-08 ENCOUNTER — Other Ambulatory Visit: Payer: Self-pay | Admitting: Psychiatry

## 2020-10-08 DIAGNOSIS — F3341 Major depressive disorder, recurrent, in partial remission: Secondary | ICD-10-CM

## 2020-10-12 ENCOUNTER — Ambulatory Visit: Payer: PPO | Admitting: Psychiatry

## 2020-10-13 ENCOUNTER — Ambulatory Visit (INDEPENDENT_AMBULATORY_CARE_PROVIDER_SITE_OTHER): Payer: PPO | Admitting: Psychiatry

## 2020-10-13 ENCOUNTER — Encounter: Payer: Self-pay | Admitting: Psychiatry

## 2020-10-13 ENCOUNTER — Other Ambulatory Visit: Payer: Self-pay

## 2020-10-13 VITALS — BP 119/62 | HR 87 | Temp 97.2°F | Wt 131.8 lb

## 2020-10-13 DIAGNOSIS — F339 Major depressive disorder, recurrent, unspecified: Secondary | ICD-10-CM

## 2020-10-13 DIAGNOSIS — Z9111 Patient's noncompliance with dietary regimen: Secondary | ICD-10-CM

## 2020-10-13 DIAGNOSIS — F17200 Nicotine dependence, unspecified, uncomplicated: Secondary | ICD-10-CM | POA: Diagnosis not present

## 2020-10-13 DIAGNOSIS — F1010 Alcohol abuse, uncomplicated: Secondary | ICD-10-CM | POA: Diagnosis not present

## 2020-10-13 DIAGNOSIS — Z91199 Patient's noncompliance with other medical treatment and regimen due to unspecified reason: Secondary | ICD-10-CM

## 2020-10-13 MED ORDER — LAMOTRIGINE 100 MG PO TABS: 50.0000 mg | ORAL_TABLET | Freq: Two times a day (BID) | ORAL | 0 refills | Status: DC

## 2020-10-13 NOTE — Progress Notes (Signed)
[place] MD OP Progress Note  10/13/2020 3:28 PM Ashley Cross  MRN:  [id number]  Chief Complaint:  Chief Complaint   Follow-up; Depression; Anxiety    HPI: Ashley Cross is a 65 year old Caucasian female on disability, lives in [place], has a history of MDD, history of breast cancer, chronic pain, hypertension was evaluated in office today.  Patient today reports that her daughter's death anniversary is coming up soon.  Patient became tearful when she discussed her.  Patient continues to grieve the loss of her daughter who passed away 3 years ago.  Patient reports she continues to struggle with loneliness, however is happy that she does have her animals, her pets.  She reports she knows she should not take them for granted.  She also have support from friends and does have a son who checks in on her.  Patient reports she is currently compliant on Lamictal.  Denies side effects.  Denies any anxiety attacks.  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  She has has been staying home mostly.  She does struggle with financial problems .  Patient reports she continues to drink alcohol, currently drinks 1-2 drinks of wine per day.  Patient recently was evaluated by Dr.Schnier dated 10/03/2020-for malignant hypertension, CT scan showed moderate to severe plaque of the left renal artery.  Duplex ultrasound of the renal arteries demonstrated less than 50% bilateral stenosis-patient was advised to continue current antihypertensives.  Visit Diagnosis:    ICD-10-CM   1. MDD (major depressive disorder), recurrent episode, with atypical features (HCC)  F33.9 lamoTRIgine (LAMICTAL) 100 MG tablet    2. Alcohol use disorder, mild, abuse  F10.10     3. Tobacco use disorder  F17.200     4. Noncompliance with treatment plan  Z91.11       Past Psychiatric History: Reviewed past psychiatric history from progress note on 09/16/2018.  Past trials of Lexapro, Zoloft,  Wellbutrin, Celexa  Past Medical History:  Past Medical History:  Diagnosis Date   Anxiety    Breast cancer ([place])    Left breast(nipple mass) 2018   Cervical stenosis of spine    Chicken pox    Chronic back pain    Colon polyps    COPD (chronic obstructive pulmonary disease) (HCC)    Cyst of left nipple    Depression    Hypertension    Insomnia    Personal history of radiation therapy     Past Surgical History:  Procedure Laterality Date   BACK SURGERY     x 3 (2010 x 2; 2013 x 1)    BREAST BIOPSY Left    core- neg   BREAST LUMPECTOMY Left 2018   BREAST SURGERY Left    breast biopsy   CERVICAL CONE BIOPSY     CERVICAL FUSION     times 2   COLONOSCOPY WITH PROPOFOL N/A 04/18/2015   Procedure: COLONOSCOPY WITH PROPOFOL;  Surgeon: [name], MD;  Location: [facility] ENDOSCOPY;  Service: Endoscopy;  Laterality: N/A;   ELBOW SURGERY     2014 b/l elbows per pt ulnar nerve    ELBOW SURGERY     x2    EPIDURAL BLOCK INJECTION     Dr. Maryjean Ka    MASS EXCISION Left 04/12/2016   Procedure: EXCISION OF LEFT NIPPLE MASS;  Surgeon: Stark Klein, MD;  Location: [facility];  Service: General;  Laterality: Left;   NECK SURGERY     POSTERIOR  CERVICAL FUSION/FORAMINOTOMY  03/07/2011   Procedure: POSTERIOR CERVICAL FUSION/FORAMINOTOMY LEVEL 4;  Surgeon: Eustace Moore, MD;  Location: [facility] NEURO ORS;  Service: Neurosurgery;  Laterality: N/A;  cervical three to thoracic one posterior cervical fusion    SENTINEL NODE BIOPSY Left 05/29/2016   Procedure: LEFT SENTINEL LYMPH NODE BIOPSY;  Surgeon: Stark Klein, MD;  Location: [facility];  Service: General;  Laterality: Left;   TONSILLECTOMY      Family Psychiatric History: Reviewed family psychiatric history from progress note on 09/16/2018  Family History:  Family History  Problem Relation Age of Onset   Breast cancer Mother 49   Hypertension Mother    Cancer Mother        breast dx'ed 71    Cancer Father        prostate    Hypertension Father    Drug abuse Daughter    Hypertension Maternal Grandmother    Breast cancer Cousin     Social History: Reviewed social history from progress note on 09/16/2018 Social History   Socioeconomic History   Marital status: Divorced    Spouse name: Not on file   Number of children: 2   Years of education: Not on file   Highest education level: Not on file  Occupational History   Not on file  Tobacco Use   Smoking status: Every Day    Packs/day: 0.25    Years: 30.00    Pack years: 7.50    Types: Cigarettes   Smokeless tobacco: Never   Tobacco comments:    work days - 6 cigarettes; weekends - ~10 - reported on 10/13/2020  Vaping Use   Vaping Use: Every day  Substance and Sexual Activity   Alcohol use: Yes    Comment: social   Drug use: No    Comment: on opana since jan 2018   Sexual activity: Not on file  Other Topics Concern   Not on file  Social History Narrative   As of 01/25/17 not sexually active of in relationship    Divorced   2 kids son and daughter (though daughter died of suicide in May 09, 2017)   Son lives in [place] now as of 12/2018       Disability    Loves animals has 3 dogs           Social Determinants of Radio broadcast assistant Strain: Low Risk    Difficulty of Paying Living Expenses: Not hard at all  Food Insecurity: No Food Insecurity   Worried About Charity fundraiser in the Last Year: Never true   Arboriculturist in the Last Year: Never true  Transportation Needs: No Transportation Needs   Lack of Transportation (Medical): No   Lack of Transportation (Non-Medical): No  Physical Activity: Not on file  Stress: Stress Concern Present   Feeling of Stress : Rather much  Social Connections: Unknown   Frequency of Communication with Friends and Family: More than three times a week   Frequency of Social Gatherings with Friends and Family: Not on file   Attends Religious Services: Not on file   Active Member of Clubs or  Organizations: Not on file   Attends Archivist Meetings: Not on file   Marital Status: Not on file    Allergies:  Allergies  Allergen Reactions   Anastrozole    Augmentin [Amoxicillin-Pot Clavulanate]     Diarrhea    Norvasc [Amlodipine]     Swelling  Topamax [Topiramate]     Didn't like the way made feel more irritatble/sad    Sulfa Antibiotics Itching and Rash   Sulfonamide Derivatives Itching and Rash    Metabolic Disorder Labs: Lab Results  Component Value Date   HGBA1C 5.4 04/28/2020   No results found for: PROLACTIN Lab Results  Component Value Date   CHOL 186 04/28/2020   TRIG 73.0 04/28/2020   HDL 95.00 04/28/2020   CHOLHDL 2 04/28/2020   VLDL 14.6 04/28/2020   LDLCALC 76 04/28/2020   LDLCALC 85 08/25/2019   Lab Results  Component Value Date   TSH 0.71 08/25/2019   TSH 2.91 04/03/2019    Therapeutic Level Labs: No results found for: LITHIUM No results found for: VALPROATE No components found for:  CBMZ  Current Medications: Current Outpatient Medications  Medication Sig Dispense Refill   albuterol (VENTOLIN HFA) 108 (90 Base) MCG/ACT inhaler Inhale 1-2 puffs into the lungs every 4 (four) hours as needed for wheezing or shortness of breath. 18 g 12   azelastine (ASTELIN) 0.1 % nasal spray      buPROPion (WELLBUTRIN XL) 300 MG 24 hr tablet Take 1 tablet (300 mg total) by mouth daily. 90 tablet 1   cyclobenzaprine (FLEXERIL) 10 MG tablet Take 10 mg by mouth every 8 (eight) hours as needed.     diazepam (VALIUM) 5 MG tablet SMARTSIG:1 Tablet(s) By Mouth     doxycycline (VIBRA-TABS) 100 MG tablet Take 1 tablet (100 mg total) by mouth 2 (two) times daily. For food 20 tablet 0   DULoxetine (CYMBALTA) 60 MG capsule TAKE 1 CAPSULE BY MOUTH TWICE DAILY 60 capsule 1   fluticasone (FLONASE) 50 MCG/ACT nasal spray TAKE 2 SPRAYS INTO BOTH NOSTRILS DAILY 16 g 11   furosemide (LASIX) 40 MG tablet Take 1 tablet (40 mg total) by mouth daily. 90 tablet 3    gabapentin (NEURONTIN) 600 MG tablet Take 1,200 mg by mouth 3 (three) times daily.     hydrOXYzine (VISTARIL) 25 MG capsule TAKE ONE CAPSULE EVERY EIGHT HOURS AS NEEDED FOR ANXIETY 90 capsule 1   ipratropium (ATROVENT) 0.06 % nasal spray PLACE 2 SPRAYS INTO BOTH NOSTRILS 3 TIMES DAILY AS NEEDED 15 mL 12   lamoTRIgine (LAMICTAL) 100 MG tablet Take 0.5 tablets (50 mg total) by mouth 2 (two) times daily. 30 tablet 0   letrozole (FEMARA) 2.5 MG tablet Take 1 tablet (2.5 mg total) by mouth daily. 90 tablet 3   metoprolol succinate (TOPROL-XL) 25 MG 24 hr tablet Take 1 tablet (25 mg total) by mouth daily. 90 tablet 3   MODERNA COVID-19 VACCINE 100 MCG/0.5ML injection      montelukast (SINGULAIR) 10 MG tablet Take 1 tablet (10 mg total) by mouth at bedtime. 90 tablet 3   Multiple Vitamins-Minerals (MULTIVITAMIN) tablet Take 1 tablet by mouth daily. 90 tablet 3   mupirocin ointment (BACTROBAN) 2 % Apply 1 application topically 2 (two) times daily. 30 g 0   oxymorphone (OPANA) 10 MG tablet Take 10 mg by mouth 2 (two) times daily.     SENEXON-S 8.6-50 MG tablet TAKE 1-2 TABLETS BY MOUTH DAILY AS NEEDED FOR MILD CONSTIPATION. 180 tablet 1   telmisartan (MICARDIS) 80 MG tablet Take 1 tablet (80 mg total) by mouth daily. 90 tablet 3   Tiotropium Bromide-Olodaterol (STIOLTO RESPIMAT) 2.5-2.5 MCG/ACT AERS Inhale 2 puffs into the lungs daily. 4 g 11   No current facility-administered medications for this visit.     Musculoskeletal: Strength &  Muscle Tone: within normal limits Gait & Station: normal Patient leans: N/A  Psychiatric Specialty Exam: Review of Systems  Psychiatric/Behavioral:  Positive for dysphoric mood. The patient is nervous/anxious.   All other systems reviewed and are negative.  Blood pressure 119/62, pulse 87, temperature (!) 97.2 F (36.2 C), temperature source Temporal, weight 131 lb 12.8 oz (59.8 kg).Body mass index is 22.62 kg/m.  General Appearance: Casual  Eye Contact:   Fair  Speech:  Clear and Coherent  Volume:  Normal  Mood:  Anxious, grieving  Affect:  Tearful  Thought Process:  Goal Directed and Descriptions of Associations: Intact  Orientation:  Full (Time, Place, and Person)  Thought Content: Logical   Suicidal Thoughts:  No  Homicidal Thoughts:  No  Memory:  Immediate;   Fair Recent;   Fair Remote;   Fair  Judgement:  Fair  Insight:  Fair  Psychomotor Activity:  Normal  Concentration:  Concentration: Fair and Attention Span: Fair  Recall:  [employer] of Knowledge: Fair  Language: Fair  Akathisia:  No  Handed:  Right  AIMS (if indicated): done  Assets:  Communication Skills Desire for Improvement Housing Social Support Transportation Vocational/Educational  ADL's:  Intact  Cognition: WNL  Sleep:  Fair   Screenings: GAD-7    [place] Office Visit from 06/12/2019 in [facility] Office Visit from 09/30/2017 in [facility]  Total GAD-7 Score 18 18      PHQ2-9    [place] Video Visit from 06/29/2020 in [facility] Video Visit from 06/02/2020 in [place] Video Visit from 05/09/2020 in [facility] from 04/12/2020 in [facility] Video Visit from 04/11/2020 in [facility]  PHQ-2 Total Score 0 '2 1 2 2  '$ PHQ-9 Total Score '4 7 12 13 13      '$ Flowsheet Row Video Visit from 09/16/2020 in [facility] Counselor from 06/06/2020 in [facility] from 05/10/2020 in [facility] No Risk No Risk No Risk        Assessment and Plan: [place] Rostek is a 65 year old Caucasian female, divorced, disabled, has a history of depression, chronic pain, hypertension, history of breast cancer was evaluated in office today.  Patient is currently  grieving the loss of her daughter who passed away almost 3 years ago, her anniversary is coming up.  Patient will continue to benefit from medication management for mood swings.  Plan as noted below.  Plan MDD-unstable Increase lamotrigine to 50 mg p.o. twice daily Cymbalta 60 mg p.o. twice daily Wellbutrin XL 300 mg p.o. daily-reduced dosage Hydroxyzine 25 mg p.o. daily as needed for severe anxiety attacks Continue CBT  Alcohol use disorder-mild-unstable Patient continues to drink 1-2 drinks on a regular basis. Provided education.  Provided counseling. We will coordinate care with therapist.  Tobacco use disorder-improving Provided counseling for 2 minutes.  Noncompliance with treatment-provided education.  She is more compliant.  Provided supportive counseling, provided grief counseling.  I have reviewed notes per Dr.Schnier dated 10/03/2020 as noted above.  Follow-up in clinic in 3 weeks.  This note was generated in part or whole with voice recognition software. Voice recognition is usually quite accurate but there are transcription errors that can and very often do occur. I apologize for any typographical errors that were not detected and corrected.        Ursula Alert, MD  10/14/2020, 12:31 PM

## 2020-10-20 DIAGNOSIS — M5416 Radiculopathy, lumbar region: Secondary | ICD-10-CM | POA: Diagnosis not present

## 2020-11-03 ENCOUNTER — Ambulatory Visit: Payer: PPO | Admitting: Psychiatry

## 2020-11-04 ENCOUNTER — Other Ambulatory Visit: Payer: Self-pay | Admitting: Psychiatry

## 2020-11-04 DIAGNOSIS — F3341 Major depressive disorder, recurrent, in partial remission: Secondary | ICD-10-CM

## 2020-11-08 DIAGNOSIS — M5416 Radiculopathy, lumbar region: Secondary | ICD-10-CM | POA: Diagnosis not present

## 2020-11-08 DIAGNOSIS — R569 Unspecified convulsions: Secondary | ICD-10-CM | POA: Insufficient documentation

## 2020-11-08 DIAGNOSIS — M961 Postlaminectomy syndrome, not elsewhere classified: Secondary | ICD-10-CM | POA: Diagnosis not present

## 2020-11-08 DIAGNOSIS — M546 Pain in thoracic spine: Secondary | ICD-10-CM | POA: Diagnosis not present

## 2020-11-10 ENCOUNTER — Other Ambulatory Visit: Payer: Self-pay | Admitting: Psychiatry

## 2020-11-10 DIAGNOSIS — F1010 Alcohol abuse, uncomplicated: Secondary | ICD-10-CM

## 2020-11-15 ENCOUNTER — Telehealth: Payer: Self-pay | Admitting: Neurology

## 2020-11-15 ENCOUNTER — Ambulatory Visit: Payer: PPO | Admitting: Neurology

## 2020-11-15 ENCOUNTER — Encounter: Payer: Self-pay | Admitting: Neurology

## 2020-11-15 VITALS — BP 133/78 | HR 86 | Ht 64.0 in | Wt 135.0 lb

## 2020-11-15 DIAGNOSIS — R296 Repeated falls: Secondary | ICD-10-CM

## 2020-11-15 DIAGNOSIS — R569 Unspecified convulsions: Secondary | ICD-10-CM | POA: Diagnosis not present

## 2020-11-15 MED ORDER — LAMOTRIGINE 25 MG PO TABS: 25.0000 mg | ORAL_TABLET | Freq: Every day | ORAL | 0 refills | Status: DC

## 2020-11-15 NOTE — Progress Notes (Signed)
[facility]  PATIENT: Ashley Cross DOB: [birth year]  REFERRING CLINICIAN: Chyrl Civatte Ardis Rowan* HISTORY FROM: Patient REASON FOR VISIT: Seizure like activity    HISTORICAL  CHIEF COMPLAINT:  Chief Complaint  Patient presents with   New Patient (Initial Visit)    Rm 12, alone, here to discuss seizure like activity last month, and falling 2 weeks ago, states both event she does not remember     HISTORY OF PRESENT ILLNESS:  This is a 65 year old woman with past medical history including anxiety/depression, hypertension, history of breast cancer chronic back pain who is presenting with seizure-like activity.  Patient's reported 2 episodes in the past couple months in which she did state that she does not have much detail/recollection but noted that her vision went away, was not able to see straight, she could not think straight,her knees buckled, she fell,  she called her PMD who then alerted EMS to come and get patient.  Patient stated that she does recall police coming in knocking on her door but does not recall any additional information, she refused to go to the ED.  She said after the event it took about 3 days to get to her normal self.  Again she did not seek medical help with the first event.  Then a couple weeks later, she was helping her neighbor, she had another event where her neighbor described her as being out of character, yelling at her, being angry and irritable, then sitting on the chair and passing out for couple hours.  Again patient stated that he took about a couple days to come back to normal self.  She denies any previous history of seizures denies any seizure risk factor and denies any additional event that are similar.  She also report a couple falls because her knee gives out, denies any injury from the falls.  She reported her psychiatrist has started her on lamotrigine a couple weeks ago.  She is currently taking 100 mg in the morning.   Denies any side effect from the medication.     Handedness: Right   Seizure Type: Unclear, did described periods of vision going out, cannot think straight, been out of character, yelling and confused.  Current frequency: only 2 events   Any injuries from seizures: None   Seizure risk factors: None reported   Previous ASMs: Lamotrigine (use for depression/mood)  Currenty ASMs: Lamotrigine (use for depression/mood)   ASMs side effects: None   Brain Images: CT head 2017: no acute abnormality   Previous EEGs: None    OTHER MEDICAL CONDITIONS: Anxiety, depression, hypertension, history of breast cancer, chronic back pain, COPD and hypertension  REVIEW OF SYSTEMS: Full 14 system review of systems performed and negative with exception of: As noted in the HPI  ALLERGIES: Allergies  Allergen Reactions   Anastrozole    Augmentin [Amoxicillin-Pot Clavulanate]     Diarrhea    Norvasc [Amlodipine]     Swelling    Topamax [Topiramate]     Didn't like the way made feel more irritatble/sad    Sulfa Antibiotics Itching and Rash   Sulfonamide Derivatives Itching and Rash    HOME MEDICATIONS: Outpatient Medications Prior to Visit  Medication Sig Dispense Refill   albuterol (VENTOLIN HFA) 108 (90 Base) MCG/ACT inhaler Inhale 1-2 puffs into the lungs every 4 (four) hours as needed for wheezing or shortness of breath. 18 g 12   azelastine (ASTELIN) 0.1 % nasal spray      buPROPion [facility]  XL) 300 MG 24 hr tablet TAKE 1 TABLET BY MOUTH ONCE DAILY *NOTE DOSEAGE CHANGE* **STOP THE 450MG  DAILY** 90 tablet 1   cyclobenzaprine (FLEXERIL) 10 MG tablet Take 10 mg by mouth every 8 (eight) hours as needed.     diazepam (VALIUM) 5 MG tablet SMARTSIG:1 Tablet(s) By Mouth     doxycycline (VIBRA-TABS) 100 MG tablet Take 1 tablet (100 mg total) by mouth 2 (two) times daily. For food 20 tablet 0   DULoxetine (CYMBALTA) 60 MG capsule TAKE 1 CAPSULE BY MOUTH TWICE DAILY 60 capsule 1   fluticasone  (FLONASE) 50 MCG/ACT nasal spray TAKE 2 SPRAYS INTO BOTH NOSTRILS DAILY 16 g 11   furosemide (LASIX) 40 MG tablet Take 1 tablet (40 mg total) by mouth daily. 90 tablet 3   gabapentin (NEURONTIN) 600 MG tablet Take 1,200 mg by mouth 3 (three) times daily.     hydrOXYzine (VISTARIL) 25 MG capsule TAKE ONE CAPSULE EVERY EIGHT HOURS AS NEEDED FOR ANXIETY 90 capsule 1   ipratropium (ATROVENT) 0.06 % nasal spray PLACE 2 SPRAYS INTO BOTH NOSTRILS 3 TIMES DAILY AS NEEDED 15 mL 12   lamoTRIgine (LAMICTAL) 100 MG tablet Take 0.5 tablets (50 mg total) by mouth 2 (two) times daily. 30 tablet 0   letrozole (FEMARA) 2.5 MG tablet Take 1 tablet (2.5 mg total) by mouth daily. 90 tablet 3   metoprolol succinate (TOPROL-XL) 25 MG 24 hr tablet Take 1 tablet (25 mg total) by mouth daily. 90 tablet 3   MODERNA COVID-19 VACCINE 100 MCG/0.5ML injection      montelukast (SINGULAIR) 10 MG tablet Take 1 tablet (10 mg total) by mouth at bedtime. 90 tablet 3   Multiple Vitamins-Minerals (MULTIVITAMIN) tablet Take 1 tablet by mouth daily. 90 tablet 3   mupirocin ointment (BACTROBAN) 2 % Apply 1 application topically 2 (two) times daily. 30 g 0   oxymorphone (OPANA) 10 MG tablet Take 10 mg by mouth 2 (two) times daily.     SENEXON-S 8.6-50 MG tablet TAKE 1-2 TABLETS BY MOUTH DAILY AS NEEDED FOR MILD CONSTIPATION. 180 tablet 1   telmisartan (MICARDIS) 80 MG tablet Take 1 tablet (80 mg total) by mouth daily. 90 tablet 3   Tiotropium Bromide-Olodaterol (STIOLTO RESPIMAT) 2.5-2.5 MCG/ACT AERS Inhale 2 puffs into the lungs daily. 4 g 11   No facility-administered medications prior to visit.    PAST MEDICAL HISTORY: Past Medical History:  Diagnosis Date   Anxiety    Breast cancer ([place])    Left breast(nipple mass) 2018   Cervical stenosis of spine    Chicken pox    Chronic back pain    Colon polyps    COPD (chronic obstructive pulmonary disease) (HCC)    Cyst of left nipple    Depression    Hypertension    Insomnia     Personal history of radiation therapy     PAST SURGICAL HISTORY: Past Surgical History:  Procedure Laterality Date   BACK SURGERY     x 3 (2010 x 2; 2013 x 1)    BREAST BIOPSY Left    core- neg   BREAST LUMPECTOMY Left 2018   BREAST SURGERY Left    breast biopsy   CERVICAL CONE BIOPSY     CERVICAL FUSION     times 2   COLONOSCOPY WITH PROPOFOL N/A 04/18/2015   Procedure: COLONOSCOPY WITH PROPOFOL;  Surgeon: [name], MD;  Location: [facility] ENDOSCOPY;  Service: Endoscopy;  Laterality: N/A;   ELBOW SURGERY  04/24/12 b/l elbows per pt ulnar nerve    ELBOW SURGERY     x2    EPIDURAL BLOCK INJECTION     Dr. Maryjean Ka    MASS EXCISION Left 04/12/2016   Procedure: EXCISION OF LEFT NIPPLE MASS;  Surgeon: Stark Klein, MD;  Location: [place];  Service: General;  Laterality: Left;   NECK SURGERY     POSTERIOR CERVICAL FUSION/FORAMINOTOMY  03/07/2011   Procedure: POSTERIOR CERVICAL FUSION/FORAMINOTOMY LEVEL 4;  Surgeon: Eustace Moore, MD;  Location: [place] [facility];  Service: Neurosurgery;  Laterality: N/A;  cervical three to thoracic one posterior cervical fusion    SENTINEL NODE BIOPSY Left 05/29/2016   Procedure: LEFT SENTINEL LYMPH NODE BIOPSY;  Surgeon: Stark Klein, MD;  Location: [place];  Service: General;  Laterality: Left;   TONSILLECTOMY      FAMILY HISTORY: Family History  Problem Relation Age of Onset   Breast cancer Mother 45   Hypertension Mother    Cancer Mother        breast dx'ed 81    Cancer Father        prostate   Hypertension Father    Drug abuse Daughter    Hypertension Maternal Grandmother    Breast cancer Cousin     SOCIAL HISTORY: Social History   Socioeconomic History   Marital status: Divorced    Spouse name: Not on file   Number of children: 2   Years of education: Not on file   Highest education level: Not on file  Occupational History   Not on file  Tobacco Use   Smoking status: Every Day    Packs/day: 0.25    Years:  30.00    Pack years: 7.50    Types: Cigarettes   Smokeless tobacco: Never   Tobacco comments:    work days - 6 cigarettes; weekends - ~10 - reported on 10/13/2020  Vaping Use   Vaping Use: Every day  Substance and Sexual Activity   Alcohol use: Yes    Comment: social   Drug use: No    Comment: on opana since jan 2018   Sexual activity: Not on file  Other Topics Concern   Not on file  Social History Narrative   As of 01/25/17 not sexually active of in relationship    Divorced   2 kids son and daughter (though daughter died of suicide in Apr 24, 2017)   Son lives in [place] now as of 12/2018       Disability    [place] has 3 dogs           Social Determinants of Radio broadcast assistant Strain: Low Risk    Difficulty of Paying Living Expenses: Not hard at all  Food Insecurity: No Food Insecurity   Worried About Charity fundraiser in the Last Year: Never true   Arboriculturist in the Last Year: Never true  Transportation Needs: No Transportation Needs   Lack of Transportation (Medical): No   Lack of Transportation (Non-Medical): No  Physical Activity: Not on file  Stress: Stress Concern Present   Feeling of Stress : Rather much  Social Connections: Unknown   Frequency of Communication with Friends and Family: More than three times a week   Frequency of Social Gatherings with Friends and Family: Not on file   Attends Religious Services: Not on file   Active Member of Clubs or Organizations: Not on file   Attends Club or  Organization Meetings: Not on file   Marital Status: Not on file  Intimate Partner Violence: Not At Risk   Fear of Current or Ex-Partner: No   Emotionally Abused: No   Physically Abused: No   Sexually Abused: No     PHYSICAL EXAM  GENERAL EXAM/CONSTITUTIONAL: Vitals:  Vitals:   11/15/20 1259  BP: 133/78  Pulse: 86  Weight: 135 lb (61.2 kg)  Height: 5\' 4"  (1.626 m)   Body mass index is 23.17 kg/m. Wt Readings from Last 3 Encounters:   11/15/20 135 lb (61.2 kg)  10/03/20 129 lb (58.5 kg)  08/05/20 130 lb 9.6 oz (59.2 kg)   Patient is in no distress; well developed, nourished and groomed; neck is supple  EYES: Pupils round and reactive to light, Visual fields full to confrontation, Extraocular movements intacts,  No results found.  MUSCULOSKELETAL: Gait, strength, tone, movements noted in Neurologic exam below  NEUROLOGIC: MENTAL STATUS:  No flowsheet data found. awake, alert, oriented to person, place and time recent and remote memory intact normal attention and concentration language fluent, comprehension intact, naming intact fund of knowledge appropriate  CRANIAL NERVE:  2nd, 3rd, 4th, 6th - pupils equal and reactive to light, visual fields full to confrontation, extraocular muscles intact, no nystagmus 5th - facial sensation symmetric 7th - facial strength symmetric 8th - hearing intact 9th - palate elevates symmetrically, uvula midline 11th - shoulder shrug symmetric 12th - tongue protrusion midline  MOTOR:  normal bulk and tone, full strength in the BUE, BLE  SENSORY:  normal and symmetric to light touch, pinprick, temperature, vibration  COORDINATION:  finger-nose-finger, fine finger movements normal  REFLEXES:  deep tendon reflexes present and symmetric  GAIT/STATION:  normal    DIAGNOSTIC DATA (LABS, IMAGING, TESTING) - I reviewed patient records, labs, notes, testing and imaging myself where available.  Lab Results  Component Value Date   WBC 5.4 04/28/2020   HGB 12.4 04/28/2020   HCT 36.9 04/28/2020   MCV 93.4 04/28/2020   PLT 256.0 04/28/2020      Component Value Date/Time   NA 136 08/05/2020 1656   K 4.3 08/05/2020 1656   CL 99 08/05/2020 1656   CO2 25 08/05/2020 1656   GLUCOSE 82 08/05/2020 1656   BUN 9 08/05/2020 1656   CREATININE 0.73 08/05/2020 1656   CALCIUM 9.0 08/05/2020 1656   PROT 6.5 08/05/2020 1656   ALBUMIN 4.1 04/28/2020 1546   AST 26 08/05/2020 1656    AST 25 02/13/2017 0936   ALT 21 08/05/2020 1656   ALT 22 02/13/2017 0936   ALKPHOS 81 04/28/2020 1546   BILITOT 0.4 08/05/2020 1656   BILITOT 0.7 02/13/2017 0936   GFRNONAA >60 02/13/2017 0936   GFRAA >60 02/13/2017 0936   Lab Results  Component Value Date   CHOL 186 04/28/2020   HDL 95.00 04/28/2020   LDLCALC 76 04/28/2020   TRIG 73.0 04/28/2020   Lab Results  Component Value Date   HGBA1C 5.4 04/28/2020   Lab Results  Component Value Date   VITAMINB12 569 02/27/2019   Lab Results  Component Value Date   TSH 0.71 08/25/2019    Head CT 2017 No acute intracranial abnormality. Mild brain parenchymal atrophy.     ASSESSMENT AND PLAN  65 y.o. year old female  with PMHx including anxiety/depression, hypertension, history of breast cancer, chronic back pain who is presenting with 2 events concerning for seizure-like activity requiring further investigation.  She denies any previous history of seizures and  denies any seizure risk factor.  Currently she is on lamotrigine 100 mg daily for mood/depression.  I will uptitrate her lamotrigine to a goal of 100 mg twice daily to cover for seizures.  I will also obtain a MRI brain with and without contrast and a routine EEG.  We will contact the patient to go over the result otherwise I will see her in 3 months for follow-up.    1. Seizure-like activity ([place])   2. Falls frequently      PLAN: Continue with Lamotrigine, we will increase to a goal of 100 mg BID  Week 1: Take 100 mg in the AM and 25 mg in the PM Week 2: Take 100 mg in the AM and 50 mg in the PM Week 3: Take 100 mg in the AM and 75 mg in the PM Week 4: Take 100 mg in the AM and 100 mg in the PM  Please contact me to send a new prescription   MRI Brain with and without contrast  Routine EEG  Referral to Physical therapy for gait training for multiple falls.  Return to clinic in 3 months    Per [facility] statutes, patients with seizures are not  allowed to drive until they have been seizure-free for six months.  Other recommendations include using caution when using heavy equipment or power tools. Avoid working on ladders or at heights. Take showers instead of baths.  Do not swim alone.  Ensure the water temperature is not too high on the home water heater. Do not go swimming alone. Do not lock yourself in a room alone (i.e. bathroom). When caring for infants or small children, sit down when holding, feeding, or changing them to minimize risk of injury to the child in the event you have a seizure. Maintain good sleep hygiene. Avoid alcohol.  Also recommend adequate sleep, hydration, good diet and minimize stress.   During the Seizure  - First, ensure adequate ventilation and place patients on the floor on their left side  Loosen clothing around the neck and ensure the airway is patent. If the patient is clenching the teeth, do not force the mouth open with any object as this can cause severe damage - Remove all items from the surrounding that can be hazardous. The patient may be oblivious to what's happening and may not even know what he or she is doing. If the patient is confused and wandering, either gently guide him/her away and block access to outside areas - Reassure the individual and be comforting - Call 911. In most cases, the seizure ends before EMS arrives. However, there are cases when seizures may last over 3 to 5 minutes. Or the individual may have developed breathing difficulties or severe injuries. If a pregnant patient or a person with diabetes develops a seizure, it is prudent to call an ambulance. - Finally, if the patient does not regain full consciousness, then call EMS. Most patients will remain confused for about 45 to 90 minutes after a seizure, so you must use judgment in calling for help. - Avoid restraints but make sure the patient is in a bed with padded side rails - Place the individual in a lateral position with the  neck slightly flexed; this will help the saliva drain from the mouth and prevent the tongue from falling backward - Remove all nearby furniture and other hazards from the area - Provide verbal assurance as the individual is regaining consciousness - Provide the patient  with privacy if possible - Call for help and start treatment as ordered by the caregiver   After the Seizure (Postictal Stage)  After a seizure, most patients experience confusion, fatigue, muscle pain and/or a headache. Thus, one should permit the individual to sleep. For the next few days, reassurance is essential. Being calm and helping reorient the person is also of importance.  Most seizures are painless and end spontaneously. Seizures are not harmful to others but can lead to complications such as stress on the lungs, brain and the heart. Individuals with prior lung problems may develop labored breathing and respiratory distress.     Orders Placed This Encounter  Procedures   MR BRAIN W WO CONTRAST   Ambulatory referral to Physical Therapy   EEG adult     Meds ordered this encounter  Medications   lamoTRIgine (LAMICTAL) 25 MG tablet    Sig: Take 1 tablet (25 mg total) by mouth daily.    Dispense:  90 tablet    Refill:  0     Return in about 3 months (around 02/15/2021).    Alric Ran, MD 11/15/2020, 3:25 PM  [facility] [address], [place], [place] [address] [phone]

## 2020-11-15 NOTE — Patient Instructions (Addendum)
Continue with Lamotrigine, we will increase to a goal of 100 mg BID  Week 1: Take 100 mg in the AM and 25 mg in the PM Week 2: Take 100 mg in the AM and 50 mg in the PM Week 3: Take 100 mg in the AM and 75 mg in the PM Week 4: Take 100 mg in the AM and 100 mg in the PM  Please contact me to send a new prescription   MRI Brain with and without contrast  Routine EEG  Referral to Physical therapy for gait training for multiple falls.  Return to clinic in 3 months

## 2020-11-15 NOTE — Telephone Encounter (Signed)
health team order sent to GI, [facility] they will reach out to the patient to schedule.

## 2020-11-16 ENCOUNTER — Ambulatory Visit (INDEPENDENT_AMBULATORY_CARE_PROVIDER_SITE_OTHER): Payer: PPO | Admitting: Psychiatry

## 2020-11-16 ENCOUNTER — Other Ambulatory Visit: Payer: Self-pay

## 2020-11-16 ENCOUNTER — Encounter: Payer: Self-pay | Admitting: Psychiatry

## 2020-11-16 ENCOUNTER — Other Ambulatory Visit
Admission: RE | Admit: 2020-11-16 | Discharge: 2020-11-16 | Disposition: A | Discharge status: Home / Self Care | Payer: PPO | Source: Ambulatory Visit | Attending: Psychiatry | Admitting: Psychiatry

## 2020-11-16 VITALS — BP 115/68 | HR 87 | Temp 97.7°F | Wt 136.0 lb

## 2020-11-16 DIAGNOSIS — F17200 Nicotine dependence, unspecified, uncomplicated: Secondary | ICD-10-CM

## 2020-11-16 DIAGNOSIS — F339 Major depressive disorder, recurrent, unspecified: Secondary | ICD-10-CM | POA: Insufficient documentation

## 2020-11-16 DIAGNOSIS — Z79899 Other long term (current) drug therapy: Secondary | ICD-10-CM | POA: Insufficient documentation

## 2020-11-16 DIAGNOSIS — F1010 Alcohol abuse, uncomplicated: Secondary | ICD-10-CM

## 2020-11-16 LAB — TSH: TSH: 3.036 u[IU]/mL (ref 0.350–4.500)

## 2020-11-16 MED ORDER — LAMOTRIGINE 100 MG PO TABS: 100.0000 mg | ORAL_TABLET | Freq: Every day | ORAL | 1 refills | Status: DC

## 2020-11-16 NOTE — Progress Notes (Signed)
[facility] MD OP Progress Note  11/16/2020 5:08 PM Ashley Cross  MRN:  [id number]  Chief Complaint:  Chief Complaint   Follow-up; Depression; Anxiety    HPI: Ashley Cross is a 65 year old Caucasian female on disability, lives in [place], has a history of MDD, history of breast cancer, chronic pain, hypertension was evaluated in office today.  Patient today appeared to be tearful, ruminating about everything that is going on in her life.  Patient with recent seizure-like spells, had 2 episodes 2 weeks apart recently.  Patient reports these episodes as having blurry vision, not being able to think straight, knees getting buckled, falling.  Patient had EMS coming after the initial event alerted by her PMD.  Patient had neurology visit recently and is currently scheduled for EEG as well as MRI of her brain.  Reviewed notes per neurologist-Dr.Camara dated 11/15/2020  Patient today reports that with everything going on and the fact that she does not have a lot of social support system . She hence has been very nervous.  She reports she struggles with  relationship problems with her brother, her sister-in-law.  She also has not been able to reach out to her son who lives in [place].  She wants to leave it alone for now and wants to find out what is happening with her before sharing it with any of her family.  However it hurts since she does not have anyone to talk to at this time.  She has not been compliant in therapy and agrees to restart psychotherapy sessions.  Patient appeared to be tearful in session today and needed reassurance.  Patient reports she is compliant on medications.  She continues to use alcohol however reports she has been drinking 2 glasses of wine on the weekends only and not more than that.  She reports she has not been mixing alcohol with her medications.  She denies any suicidality however reports there has been times recently when she felt if she could be  invisible.  Patient denies any other concerns today.  Visit Diagnosis:    ICD-10-CM   1. MDD (major depressive disorder), recurrent episode, with atypical features ([place])  F33.9 lamoTRIgine (LAMICTAL) 100 MG tablet    Urine drugs of abuse scrn w alc, routine (Ref Lab)    2. Alcohol use disorder, mild, abuse  F10.10     3. Tobacco use disorder  F17.200     4. High risk medication use  Z79.899 TSH    Urine drugs of abuse scrn w alc, routine (Ref Lab)      Past Psychiatric History: Reviewed past psychiatric history from progress note on 09/16/2018.  Past trials of Lexapro, Zoloft, Wellbutrin, Celexa  Past Medical History:  Past Medical History:  Diagnosis Date   Anxiety    Breast cancer ([place])    Left breast(nipple mass) 2018   Cervical stenosis of spine    Chicken pox    Chronic back pain    Colon polyps    COPD (chronic obstructive pulmonary disease) ([place])    Cyst of left nipple    Depression    Hypertension    Insomnia    Personal history of radiation therapy     Past Surgical History:  Procedure Laterality Date   BACK SURGERY     x 3 (2010 x 2; 2013 x 1)    BREAST BIOPSY Left    core- neg   BREAST LUMPECTOMY Left 2018   BREAST SURGERY Left  breast biopsy   CERVICAL CONE BIOPSY     CERVICAL FUSION     times 2   COLONOSCOPY WITH PROPOFOL N/A 04/18/2015   Procedure: COLONOSCOPY WITH PROPOFOL;  Surgeon: [name], MD;  Location: [facility] ENDOSCOPY;  Service: Endoscopy;  Laterality: N/A;   ELBOW SURGERY     05-06-2012 b/l elbows per pt ulnar nerve    ELBOW SURGERY     x2    EPIDURAL BLOCK INJECTION     Dr. Maryjean Ka    MASS EXCISION Left 04/12/2016   Procedure: EXCISION OF LEFT NIPPLE MASS;  Surgeon: Stark Klein, MD;  Location: [facility];  Service: General;  Laterality: Left;   NECK SURGERY     POSTERIOR CERVICAL FUSION/FORAMINOTOMY  03/07/2011   Procedure: POSTERIOR CERVICAL FUSION/FORAMINOTOMY LEVEL 4;  Surgeon: Eustace Moore, MD;  Location: [facility]  NEURO ORS;  Service: Neurosurgery;  Laterality: N/A;  cervical three to thoracic one posterior cervical fusion    SENTINEL NODE BIOPSY Left 05/29/2016   Procedure: LEFT SENTINEL LYMPH NODE BIOPSY;  Surgeon: Stark Klein, MD;  Location: [facility];  Service: General;  Laterality: Left;   TONSILLECTOMY      Family Psychiatric History: Reviewed family psychiatric history from progress note on 09/16/2018.  Family History:  Family History  Problem Relation Age of Onset   Breast cancer Mother 21   Hypertension Mother    Cancer Mother        breast dx'ed 64    Cancer Father        prostate   Hypertension Father    Drug abuse Daughter    Hypertension Maternal Grandmother    Breast cancer Cousin     Social History: Reviewed social history from progress note on 09/16/2018. Social History   Socioeconomic History   Marital status: Divorced    Spouse name: Not on file   Number of children: 2   Years of education: Not on file   Highest education level: Not on file  Occupational History   Not on file  Tobacco Use   Smoking status: Every Day    Packs/day: 0.25    Years: 30.00    Pack years: 7.50    Types: Cigarettes   Smokeless tobacco: Never   Tobacco comments:    work days - 6 cigarettes; weekends - ~10 - reported on 10/13/2020  Vaping Use   Vaping Use: Every day  Substance and Sexual Activity   Alcohol use: Yes    Comment: social   Drug use: No    Comment: on opana since jan 2018   Sexual activity: Not on file  Other Topics Concern   Not on file  Social History Narrative   As of 01/25/17 not sexually active of in relationship    Divorced   2 kids son and daughter (though daughter died of suicide in 05/06/17)   Son lives in [place] now as of 12/2018       Disability    Loves animals has 3 dogs           Social Determinants of Radio broadcast assistant Strain: Low Risk    Difficulty of Paying Living Expenses: Not hard at all  Food Insecurity: No Food Insecurity   Worried  About Charity fundraiser in the Last Year: Never true   Arboriculturist in the Last Year: Never true  Transportation Needs: No Transportation Needs   Lack of Transportation (Medical): No   Lack  of Transportation (Non-Medical): No  Physical Activity: Not on file  Stress: Stress Concern Present   Feeling of Stress : Rather much  Social Connections: Unknown   Frequency of Communication with Friends and Family: More than three times a week   Frequency of Social Gatherings with Friends and Family: Not on file   Attends Religious Services: Not on file   Active Member of Clubs or Organizations: Not on file   Attends Archivist Meetings: Not on file   Marital Status: Not on file    Allergies:  Allergies  Allergen Reactions   Anastrozole    Augmentin [Amoxicillin-Pot Clavulanate]     Diarrhea    Norvasc [Amlodipine]     Swelling    Topamax [Topiramate]     Didn't like the way made feel more irritatble/sad    Sulfa Antibiotics Itching and Rash   Sulfonamide Derivatives Itching and Rash    Metabolic Disorder Labs: Lab Results  Component Value Date   HGBA1C 5.4 04/28/2020   No results found for: PROLACTIN Lab Results  Component Value Date   CHOL 186 04/28/2020   TRIG 73.0 04/28/2020   HDL 95.00 04/28/2020   CHOLHDL 2 04/28/2020   VLDL 14.6 04/28/2020   LDLCALC 76 04/28/2020   LDLCALC 85 08/25/2019   Lab Results  Component Value Date   TSH 3.036 11/16/2020   TSH 0.71 08/25/2019    Therapeutic Level Labs: No results found for: LITHIUM No results found for: VALPROATE No components found for:  CBMZ  Current Medications: Current Outpatient Medications  Medication Sig Dispense Refill   albuterol (VENTOLIN HFA) 108 (90 Base) MCG/ACT inhaler Inhale 1-2 puffs into the lungs every 4 (four) hours as needed for wheezing or shortness of breath. 18 g 12   azelastine (ASTELIN) 0.1 % nasal spray      buPROPion (WELLBUTRIN XL) 300 MG 24 hr tablet TAKE 1 TABLET BY MOUTH  ONCE DAILY *NOTE DOSEAGE CHANGE* **STOP THE 450MG  DAILY** 90 tablet 1   cyclobenzaprine (FLEXERIL) 10 MG tablet Take 10 mg by mouth every 8 (eight) hours as needed.     diazepam (VALIUM) 5 MG tablet SMARTSIG:1 Tablet(s) By Mouth     doxycycline (VIBRA-TABS) 100 MG tablet Take 1 tablet (100 mg total) by mouth 2 (two) times daily. For food 20 tablet 0   DULoxetine (CYMBALTA) 60 MG capsule TAKE 1 CAPSULE BY MOUTH TWICE DAILY 60 capsule 1   fluticasone (FLONASE) 50 MCG/ACT nasal spray TAKE 2 SPRAYS INTO BOTH NOSTRILS DAILY 16 g 11   furosemide (LASIX) 40 MG tablet Take 1 tablet (40 mg total) by mouth daily. 90 tablet 3   gabapentin (NEURONTIN) 600 MG tablet Take 1,200 mg by mouth 3 (three) times daily.     hydrOXYzine (VISTARIL) 25 MG capsule TAKE ONE CAPSULE EVERY EIGHT HOURS AS NEEDED FOR ANXIETY 90 capsule 1   ipratropium (ATROVENT) 0.06 % nasal spray PLACE 2 SPRAYS INTO BOTH NOSTRILS 3 TIMES DAILY AS NEEDED 15 mL 12   lamoTRIgine (LAMICTAL) 25 MG tablet Take 1 tablet (25 mg total) by mouth daily. 90 tablet 0   letrozole (FEMARA) 2.5 MG tablet Take 1 tablet (2.5 mg total) by mouth daily. 90 tablet 3   metoprolol succinate (TOPROL-XL) 25 MG 24 hr tablet Take 1 tablet (25 mg total) by mouth daily. 90 tablet 3   MODERNA COVID-19 VACCINE 100 MCG/0.5ML injection      montelukast (SINGULAIR) 10 MG tablet Take 1 tablet (10 mg total) by mouth at  bedtime. 90 tablet 3   Multiple Vitamins-Minerals (MULTIVITAMIN) tablet Take 1 tablet by mouth daily. 90 tablet 3   mupirocin ointment (BACTROBAN) 2 % Apply 1 application topically 2 (two) times daily. 30 g 0   oxymorphone (OPANA) 10 MG tablet Take 10 mg by mouth 2 (two) times daily.     SENEXON-S 8.6-50 MG tablet TAKE 1-2 TABLETS BY MOUTH DAILY AS NEEDED FOR MILD CONSTIPATION. 180 tablet 1   telmisartan (MICARDIS) 80 MG tablet Take 1 tablet (80 mg total) by mouth daily. 90 tablet 3   Tiotropium Bromide-Olodaterol (STIOLTO RESPIMAT) 2.5-2.5 MCG/ACT AERS Inhale 2  puffs into the lungs daily. 4 g 11   lamoTRIgine (LAMICTAL) 100 MG tablet Take 1 tablet (100 mg total) by mouth daily. Tale along with 25 mg daily 30 tablet 1   No current facility-administered medications for this visit.     Musculoskeletal: Strength & Muscle Tone: within normal limits Gait & Station: normal Patient leans: N/A  Psychiatric Specialty Exam: Review of Systems  Neurological:        Seizure like spells  Psychiatric/Behavioral:  Positive for dysphoric mood. The patient is nervous/anxious.   All other systems reviewed and are negative.  Blood pressure 115/68, pulse 87, temperature 97.7 F (36.5 C), temperature source Temporal, weight 136 lb (61.7 kg).Body mass index is 23.34 kg/m.  General Appearance: Casual  Eye Contact:  Fair  Speech:  Clear and Coherent  Volume:  Normal  Mood:  Anxious and Depressed  Affect:  Tearful  Thought Process:  Goal Directed and Descriptions of Associations: Intact  Orientation:  Full (Time, Place, and Person)  Thought Content: Rumination   Suicidal Thoughts:  No  Homicidal Thoughts:  No  Memory:  Immediate;   Fair Recent;   Fair Remote;   Fair  Judgement:  Fair  Insight:  Fair  Psychomotor Activity:  Normal  Concentration:  Concentration: Fair and Attention Span: Fair  Recall:  [employer] of Knowledge: Fair  Language: Fair  Akathisia:  No  Handed:  Right  AIMS (if indicated): done  Assets:  Proofreader Vocational/Educational  ADL's:  Intact  Cognition: WNL  Sleep:  Fair   Screenings: GAD-7    [place] Office Visit from 06/12/2019 in [facility] Office Visit from 09/30/2017 in [facility]  Total GAD-7 Score 18 18      PHQ2-9    [place] Visit from 11/16/2020 in [facility] Video Visit from 06/29/2020 in [facility] Video Visit from 06/02/2020 in [place] Video Visit from 05/09/2020 in [facility] from 04/12/2020 in [facility]  PHQ-2 Total Score 6 0 2 1 2   PHQ-9 Total Score 20 4 7 12 13       [place] Visit from 11/16/2020 in [place] Video Visit from 09/16/2020 in [place] Counselor from 06/06/2020 in [place] No Risk No Risk No Risk        Assessment and Plan: Ashley Cross is a 65 year old Caucasian female, divorced, disabled, has a history of depression, chronic pain, hypertension, history of breast cancer was evaluated in office today.  Patient with recent seizure-like spells, had neurology evaluation and has upcoming procedures scheduled.  She continues to have mood swings, anxiety, will benefit from the following plan.  Plan MDD-unstable Lamotrigine recently increased to 125 mg p.o.  daily by her neurologist. Patient advised to increase the lamotrigine every week 25 mg increment per review of notes per neurologist. Lamictal was initially started by writer and since dosage is already increased-will not make any changes today. Provided education to the patient that Lamictal is also a mood stabilizer. Cymbalta 60 mg p.o. twice daily Wellbutrin XL 300 mg p.o. daily-reduced dosage.  We will consider tapering her off of the Wellbutrin if she its true seizures .  Will reevaluate in future session. Hydroxyzine 25 mg p.o. daily as needed for severe anxiety attacks. Continue CBT-patient encouraged to make an appointment with therapist.  Alcohol use disorder mild-improving Patient reports drinking 1-2 drinks on weekends only.  Provided education.  Tobacco use disorder-improving We will monitor closely  High risk medication use-will order urine drug screen, TSH.  Patient to go to [facility] lab.  I have reviewed notes  per neurologist-Dr.Camara -dated 11/15/2020 as noted above.  Follow-up in clinic in 4 to 6 weeks or sooner if needed.  Patient advised to follow-up with therapist in the meantime.  This note was generated in part or whole with voice recognition software. Voice recognition is usually quite accurate but there are transcription errors that can and very often do occur. I apologize for any typographical errors that were not detected and corrected.        Ursula Alert, MD 11/17/2020, 9:48 AM

## 2020-11-17 ENCOUNTER — Emergency Department: Payer: PPO

## 2020-11-17 ENCOUNTER — Emergency Department
Admission: EM | Admit: 2020-11-17 | Discharge: 2020-11-17 | Disposition: A | Discharge status: Home / Self Care | Payer: PPO | Attending: Emergency Medicine | Admitting: Emergency Medicine

## 2020-11-17 ENCOUNTER — Telehealth: Payer: Self-pay

## 2020-11-17 ENCOUNTER — Other Ambulatory Visit: Payer: Self-pay

## 2020-11-17 DIAGNOSIS — H538 Other visual disturbances: Secondary | ICD-10-CM | POA: Insufficient documentation

## 2020-11-17 DIAGNOSIS — R42 Dizziness and giddiness: Secondary | ICD-10-CM | POA: Diagnosis not present

## 2020-11-17 DIAGNOSIS — W19XXXA Unspecified fall, initial encounter: Secondary | ICD-10-CM | POA: Diagnosis not present

## 2020-11-17 DIAGNOSIS — Z5321 Procedure and treatment not carried out due to patient leaving prior to being seen by health care provider: Secondary | ICD-10-CM | POA: Diagnosis not present

## 2020-11-17 DIAGNOSIS — Z043 Encounter for examination and observation following other accident: Secondary | ICD-10-CM | POA: Diagnosis not present

## 2020-11-17 DIAGNOSIS — Z981 Arthrodesis status: Secondary | ICD-10-CM | POA: Diagnosis not present

## 2020-11-17 DIAGNOSIS — R791 Abnormal coagulation profile: Secondary | ICD-10-CM | POA: Insufficient documentation

## 2020-11-17 DIAGNOSIS — R4781 Slurred speech: Secondary | ICD-10-CM | POA: Insufficient documentation

## 2020-11-17 DIAGNOSIS — R4182 Altered mental status, unspecified: Secondary | ICD-10-CM | POA: Diagnosis not present

## 2020-11-17 DIAGNOSIS — R0602 Shortness of breath: Secondary | ICD-10-CM | POA: Diagnosis not present

## 2020-11-17 LAB — CBC
HCT: 37.6 % (ref 36.0–46.0)
Hemoglobin: 12.2 g/dL (ref 12.0–15.0)
MCH: 31.4 pg (ref 26.0–34.0)
MCHC: 32.4 g/dL (ref 30.0–36.0)
MCV: 96.9 fL (ref 80.0–100.0)
Platelets: 252 10*3/uL (ref 150–400)
RBC: 3.88 MIL/uL (ref 3.87–5.11)
RDW: 14 % (ref 11.5–15.5)
WBC: 8.2 10*3/uL (ref 4.0–10.5)
nRBC: 0 % (ref 0.0–0.2)

## 2020-11-17 LAB — DIFFERENTIAL
Abs Immature Granulocytes: 0.03 10*3/uL (ref 0.00–0.07)
Basophils Absolute: 0.1 10*3/uL (ref 0.0–0.1)
Basophils Relative: 1 %
Eosinophils Absolute: 0.1 10*3/uL (ref 0.0–0.5)
Eosinophils Relative: 1 %
Immature Granulocytes: 0 %
Lymphocytes Relative: 17 %
Lymphs Abs: 1.4 10*3/uL (ref 0.7–4.0)
Monocytes Absolute: 0.6 10*3/uL (ref 0.1–1.0)
Monocytes Relative: 7 %
Neutro Abs: 6 10*3/uL (ref 1.7–7.7)
Neutrophils Relative %: 74 %

## 2020-11-17 LAB — COMPREHENSIVE METABOLIC PANEL
ALT: 18 U/L (ref 0–44)
AST: 22 U/L (ref 15–41)
Albumin: 3.9 g/dL (ref 3.5–5.0)
Alkaline Phosphatase: 99 U/L (ref 38–126)
Anion gap: 10 (ref 5–15)
BUN: 10 mg/dL (ref 8–23)
CO2: 28 mmol/L (ref 22–32)
Calcium: 9.5 mg/dL (ref 8.9–10.3)
Chloride: 100 mmol/L (ref 98–111)
Creatinine, Ser: 0.66 mg/dL (ref 0.44–1.00)
GFR, Estimated: >60 mL/min (ref >60)
Glucose, Bld: 98 mg/dL (ref 70–99)
Potassium: 4.6 mmol/L (ref 3.5–5.1)
Sodium: 138 mmol/L (ref 135–145)
Total Bilirubin: 0.7 mg/dL (ref 0.3–1.2)
Total Protein: 7.2 g/dL (ref 6.5–8.1)

## 2020-11-17 LAB — PROTIME-INR
INR: 1 (ref 0.8–1.2)
Prothrombin Time: 12.8 seconds (ref 11.4–15.2)

## 2020-11-17 LAB — APTT: aPTT: 33 seconds (ref 24–36)

## 2020-11-17 MED ORDER — SODIUM CHLORIDE 0.9% FLUSH
3.0000 mL | Freq: Once | INTRAVENOUS | Status: DC
Start: 2020-11-17 — End: 2020-11-17

## 2020-11-17 NOTE — Telephone Encounter (Signed)
Writer spoke to patient, patient appeared to be confused, tearful, has no memory of her visit with writer yesterday evening.  Patient reports she may have had another seizure-like spell this morning.  She is currently being worked up for seizure-like spells, recently had an EEG and is scheduled for MRI.  We will have Ashley Cross CMA take this patient to the emergency department for an evaluation for her AMS

## 2020-11-17 NOTE — ED Notes (Addendum)
Patient stating she does not wish to be seen.  Patient informed that there was a room available for her to see a doctor.  Patient stated she did not want to be seen, wanted to go home.  Patient ambulated out to the parking lot where she fell.  Patient placed in a wheelchair and asked if she was alright and if she wanted to be seen?  Patient stated she was fine, did not want to be seen and admitted at that time to drinking Gin and orange juice today.  Patient calling for a ride home.

## 2020-11-17 NOTE — ED Triage Notes (Addendum)
Pt comes form [facility] with c/o AMS. Pt states she feels dizzy. Pt states some blurry or foggy vision that happens often.  Pt states last normal was yesterday. Pt states she fell today. Pt denies any LOC or hitting head.  Pt states she feels her gait is off and kinda funny.  Pt slurring with words and having some difficulty answering questions. Pt stats she does drink alcohol and mostly weekends. Pt states last drink was today before she left house with gin orange juice.

## 2020-11-17 NOTE — ED Provider Notes (Signed)
Emergency Medicine Provider Triage Evaluation Note  Ashley Cross , a 65 y.o. female  was evaluated in triage.  Pt complains of sent from [facility] for altered mental status.  Patient states she has blurry and foggy vision.  Did have a fall..  Review of Systems  Positive: Altered mental status Negative: No chest pain or shortness of breath  Physical Exam  BP 129/75 (BP Location: Right Arm)   Pulse 88   Temp 98.7 F (37.1 C) (Oral)   Resp 17   SpO2 100%  Gen:   Awake, no distress   Resp:  Normal effort  MSK:   Moves extremities without difficulty  Other:    Medical Decision Making  Medically screening exam initiated at 11:30 AM.  Appropriate orders placed.  Ashley Cross was informed that the remainder of the evaluation will be completed by another provider, this initial triage assessment does not replace that evaluation, and the importance of remaining in the ED until their evaluation is complete.     Versie Starks, PA-C 11/17/20 1131    Carrie Mew, MD 11/17/20 1950

## 2020-11-17 NOTE — Telephone Encounter (Signed)
pt came into the office today the volunteer at the medical mall brought the patient over to our office.  According to him he stated that pt was at [facility] clinic and she was talking out of her head and started crying. so he went over there and brought her over to our office.    Pt was crying incontrollable and states  and did not remember that she was here yesterday to see Dr. Shea Evans.  Pt was asked why she was back again and why she was crying she stated that she found her daughter dead and that she died in [birth year]1916.  She talked about her animals and how she needed to get home. She did not know how or why she went to [facility] clinic and the she stated that she needed to go get her booster shot at the pharmacy.  I notified Dr.Eappen to come speak with patient.

## 2020-11-21 ENCOUNTER — Ambulatory Visit (INDEPENDENT_AMBULATORY_CARE_PROVIDER_SITE_OTHER): Payer: PPO | Admitting: Neurology

## 2020-11-21 DIAGNOSIS — R569 Unspecified convulsions: Secondary | ICD-10-CM | POA: Diagnosis not present

## 2020-11-21 NOTE — Procedures (Signed)
    History:  65 year old woman with fall and seizure like activity.   EEG classification: Normal awake and drowsy  Description of the recording: The background rhythms of this recording consists of a fairly well modulated medium amplitude alpha rhythm of 8-9 Hz that is reactive to eye opening and closure. As the record progresses, the patient appears to remain in the waking state throughout the recording. Photic stimulation was performed, did not show any abnormalities. Hyperventilation was also performed, did not show any abnormalities. Toward the end of the recording, the patient enters the drowsy state with slight symmetric slowing seen. The patient never enters stage II sleep. No abnormal epileptiform discharges seen during this recording. There was no focal slowing. EKG monitor shows no evidence of cardiac rhythm abnormalities with a heart rate of 84.  Impression: This is a normal EEG recording in the waking and drowsy state. No evidence of interictal epileptiform discharges seen. A normal EEG does not exclude a diagnosis of epilepsy.    Alric Ran, MD [facility]

## 2020-11-23 ENCOUNTER — Telehealth: Payer: Self-pay

## 2020-11-23 NOTE — Telephone Encounter (Signed)
Received a notification from [facility] concerning care coordination. Per PCP instructions, form has been mailed to the patient for them to read and call if interested for more information. Copy of form has been sent to scan.

## 2020-11-28 ENCOUNTER — Telehealth: Payer: Self-pay

## 2020-11-28 ENCOUNTER — Other Ambulatory Visit: Payer: Self-pay

## 2020-11-28 ENCOUNTER — Ambulatory Visit
Admission: RE | Admit: 2020-11-28 | Discharge: 2020-11-28 | Disposition: A | Discharge status: Home / Self Care | Payer: PPO | Source: Ambulatory Visit | Attending: Neurology | Admitting: Neurology

## 2020-11-28 DIAGNOSIS — I6782 Cerebral ischemia: Secondary | ICD-10-CM | POA: Diagnosis not present

## 2020-11-28 DIAGNOSIS — G9389 Other specified disorders of brain: Secondary | ICD-10-CM | POA: Diagnosis not present

## 2020-11-28 DIAGNOSIS — R569 Unspecified convulsions: Secondary | ICD-10-CM

## 2020-11-28 MED ORDER — GADOBENATE DIMEGLUMINE 529 MG/ML IV SOLN
11.0000 mL | Freq: Once | INTRAVENOUS | Status: AC | PRN
  Administered 2020-11-28: 11 mL via INTRAVENOUS

## 2020-11-28 NOTE — Chronic Care Management (AMB) (Signed)
  Care [facility]   Note  11/28/2020 Name: Ashley Cross MRN: [id number] DOB: [birth year]  Ashley Cross is a 65 y.o. year old female who is a primary care patient of Ashley Cross, Ashley Glow, Ashley Cross and is actively engaged with the care Cross team. I reached out to Ashley Cross by phone today to assist with re-scheduling a follow up visit with the Pharmacist  Follow up plan: Telephone appointment with care Cross team member scheduled for:11/30/2020  Ashley Cross, Ashley Cross, Ashley Cross  [place], Ashley Cross [address] Direct Dial: [phone] [email or web address] Website: [email or web address]

## 2020-11-28 NOTE — Chronic Care Management (AMB) (Signed)
  Care Management   Note  11/28/2020 Name: Ashley Cross MRN: [id number] DOB: 05-Sep-1955  [name] is a 65 y.o. year old female who is a primary care patient of McLean-Scocuzza, Nino Glow, MD and is actively engaged with the care management team. I reached out to [name] by phone today to assist with scheduling a follow up visit with the Pharmacist  Follow up plan: Unsuccessful telephone outreach attempt made. A HIPAA compliant phone message was left for the patient providing contact information and requesting a return call.  The care management team will reach out to the patient again over the next 4 days.  If patient returns call to provider office, please advise to call [facility]  at [place], [place], [place], [place] [address] Direct Dial: [phone] [email or web address] Website: [email or web address]

## 2020-11-30 ENCOUNTER — Ambulatory Visit (INDEPENDENT_AMBULATORY_CARE_PROVIDER_SITE_OTHER): Payer: PPO | Admitting: Pharmacist

## 2020-11-30 DIAGNOSIS — M8580 Other specified disorders of bone density and structure, unspecified site: Secondary | ICD-10-CM

## 2020-11-30 DIAGNOSIS — J449 Chronic obstructive pulmonary disease, unspecified: Secondary | ICD-10-CM

## 2020-11-30 DIAGNOSIS — I10 Essential (primary) hypertension: Secondary | ICD-10-CM

## 2020-11-30 DIAGNOSIS — I700 Atherosclerosis of aorta: Secondary | ICD-10-CM

## 2020-11-30 LAB — URINE DRUGS OF ABUSE SCREEN W ALC, ROUTINE (REF LAB)
Amphetamines, Urine: NEGATIVE ng/mL
Barbiturate, Ur: NEGATIVE ng/mL
Cannabinoid Quant, Ur: NEGATIVE ng/mL
Cocaine (Metab.): NEGATIVE ng/mL
Methadone Screen, Urine: NEGATIVE ng/mL
Opiate Quant, Ur: NEGATIVE ng/mL
Phencyclidine, Ur: NEGATIVE ng/mL
Propoxyphene, Urine: NEGATIVE ng/mL

## 2020-11-30 LAB — DRUG PROFILE 799031: BENZODIAZEPINES: NEGATIVE

## 2020-11-30 LAB — ETHANOL CONFIRM, URINE: Ethanol, Ur - Confirmation: 0.092 %

## 2020-11-30 NOTE — Patient Instructions (Addendum)
Ms. Foody,   Keep up the great work!  Call [facility] to refill the [place] - [phone]. When you come to see Dr. Olivia Mackie next week, we will get your 1) proof of income and 2) signature on the enclosed application for patient assistance for [place] for 2023.   Take care!  [name], PharmD  Visit Information  PATIENT GOALS:  Goals Addressed               This Visit's Progress     Patient Stated     Medication Monitoring (pt-stated)        Patient Goals/Self-Care Activities Over the next 90 days, patient will:  - take medications as prescribed check blood pressure periodically, document, and provide at future appointments collaborate with provider on medication access solutions Focus on reduction in tobacco cessation         Patient verbalizes understanding of instructions provided today and agrees to view in [place].   Plan: Telephone follow up appointment with care management team member scheduled for:  12 weeks  [name], PharmD, [place], [place] Clinical Pharmacist [employer] at [employer] [phone]

## 2020-11-30 NOTE — Chronic Care Management (AMB) (Signed)
Chronic Care Management Pharmacy Note  11/30/2020 Name:  Ashley Cross MRN:  [id number] DOB:  [birth year]  Subjective: Ashley Cross is an 65 y.o. year old female who is a primary patient of McLean-Scocuzza, Nino Glow, MD.  The CCM team was consulted for assistance with disease management and care coordination needs.    Engaged with patient by telephone for follow up visit for pharmacy case management and/or care coordination services.   Objective:  Medications Reviewed Today     Reviewed by De Hollingshead, RPH-CPP (Pharmacist) on 11/30/20 at 1437  Med List Status: <None>   Medication Order Taking? Sig Documenting Provider Last Dose Status Informant  albuterol (VENTOLIN HFA) 108 (90 Base) MCG/ACT inhaler [id number] Yes Inhale 1-2 puffs into the lungs every 4 (four) hours as needed for wheezing or shortness of breath. McLean-Scocuzza, Nino Glow, MD Taking Active   buPROPion (WELLBUTRIN XL) 300 MG 24 hr tablet [id number] Yes TAKE 1 TABLET BY MOUTH ONCE DAILY *NOTE DOSEAGE CHANGE* **STOP THE 450MG  DAILY** Eappen, Saramma, MD Taking Active   cyclobenzaprine (FLEXERIL) 10 MG tablet [id number] Yes Take 10 mg by mouth every 8 (eight) hours as needed. [provider] Taking Active            Med Note Darnelle Maffucci, Arville Lime   Tue Apr 19, 2020 10:43 AM) Taking 1-2 times daily  DULoxetine (CYMBALTA) 60 MG capsule [id number] Yes TAKE 1 CAPSULE BY MOUTH TWICE DAILY Eappen, Saramma, MD Taking Active   fluticasone (FLONASE) 50 MCG/ACT nasal spray [id number] Yes TAKE 2 SPRAYS INTO BOTH NOSTRILS DAILY McLean-Scocuzza, Nino Glow, MD Taking Active   furosemide (LASIX) 40 MG tablet [id number] Yes Take 1 tablet (40 mg total) by mouth daily. McLean-Scocuzza, Nino Glow, MD Taking Active   gabapentin (NEURONTIN) 600 MG tablet [id number] Yes Take 1,200 mg by mouth 3 (three) times daily. [provider] Taking Active   hydrOXYzine (VISTARIL) 25 MG capsule [id number] Yes TAKE ONE CAPSULE EVERY  EIGHT HOURS AS NEEDED FOR ANXIETY Eappen, Saramma, MD Taking Active   ipratropium (ATROVENT) 0.06 % nasal spray [id number] Yes PLACE 2 SPRAYS INTO BOTH NOSTRILS 3 TIMES DAILY AS NEEDED McLean-Scocuzza, Nino Glow, MD Taking Active   lamoTRIgine (LAMICTAL) 100 MG tablet [id number] Yes Take 1 tablet (100 mg total) by mouth daily. Tale along with 25 mg daily Eappen, Ria Clock, MD Taking Active   lamoTRIgine (LAMICTAL) 25 MG tablet [id number] Yes Take 1 tablet (25 mg total) by mouth daily. Alric Ran, MD Taking Active   letrozole [facility]) 2.5 MG tablet [id number] Yes Take 1 tablet (2.5 mg total) by mouth daily. Nicholas Lose, MD Taking Active   metoprolol succinate (TOPROL-XL) 25 MG 24 hr tablet [id number] Yes Take 1 tablet (25 mg total) by mouth daily. McLean-Scocuzza, Nino Glow, MD Taking Active   montelukast (SINGULAIR) 10 MG tablet [id number] Yes Take 1 tablet (10 mg total) by mouth at bedtime. McLean-Scocuzza, Nino Glow, MD Taking Active   Multiple Vitamins-Minerals (MULTIVITAMIN) tablet [id number] No Take 1 tablet by mouth daily.  Patient not taking: Reported on 11/30/2020   McLean-Scocuzza, Nino Glow, MD Not Taking Active   oxymorphone (OPANA) 10 MG tablet [id number] Yes Take 10 mg by mouth 2 (two) times daily. [provider] Taking Active   SENEXON-S 8.6-50 MG tablet [id number] Yes TAKE 1-2 TABLETS BY MOUTH DAILY AS NEEDED FOR MILD CONSTIPATION. McLean-Scocuzza, Nino Glow, MD Taking Active   telmisartan (MICARDIS) 80 MG tablet [id number] Yes Take 1 tablet (80 mg total) by mouth daily. McLean-Scocuzza,  Nino Glow, MD Taking Active   Tiotropium Bromide-Olodaterol (STIOLTO RESPIMAT) 2.5-2.5 MCG/ACT AERS [id number] No Inhale 2 puffs into the lungs daily.  Patient not taking: Reported on 11/30/2020   McLean-Scocuzza, Nino Glow, MD Not Taking Active              Assessment/Interventions: Review of patient past medical history, allergies, medications, health status, including review of consultants reports,  laboratory and other test data, was performed as part of comprehensive evaluation and provision of chronic care management services.   SDOH:  (Social Determinants of Health) assessments and interventions performed: Yes SDOH Interventions    Flowsheet Row Most Recent Value  SDOH Interventions   Financial Strain Interventions Other (Comment)  [manufacturer assistance]        CCM Care Plan  Review of patient past medical history, allergies, medications, health status, including review of consultants reports, laboratory and other test data, was performed as part of comprehensive evaluation and provision of chronic care management services.   Conditions to be addressed/monitored:  COPD, Depression, and Anxiety  Care Plan : Medication Management  Updates made by De Hollingshead, RPH-CPP since 11/30/2020 12:00 AM     Problem: COPD, Depression/Anxiety, Tobacco Abuse      Long-Range Goal: Disease Progression Prevention   This Visit's Progress: On track  Recent Progress: On track  Priority: High  Note:   Current Barriers:  Unable to independently afford treatment regimen Unable to achieve control of tobacco   Pharmacist Clinical Goal(s):  Over the next 90 days, patient will verbalize ability to afford treatment regimen Over the next 90 days, patient will achieve reduction in tobacco use through collaboration with PharmD and provider.   Interventions: 1:1 collaboration with McLean-Scocuzza, Nino Glow, MD regarding development and update of comprehensive plan of care as evidenced by provider attestation and co-signature Inter-disciplinary care team collaboration (see longitudinal plan of care) Comprehensive medication review performed; medication list updated in electronic medical record  Depression/Anxiety, Insomnia Worsened recently; current regimen: bupropion XL 300 mg, duloxetine 60 mg BID, lamotrigine 125 mg daily hydroxyzine 25 mg PRN, usually QPM;  Encouraged to continue  current regimen at this time along with psych follow up.   Chronic Pain: Moderately well controlled; current regimen: oxymorphone 10 mg BID, gabapentin 1200 mg TID; followed by pain management  [facility], Dr. Davy Pique Using senna + docusate 2 tab daily, sometimes 3 closer to the weekend when she knows she is going to be home.  Previously recommended to continue current regimen at this time along with increased focus in hydration, fiber intake.   Chronic Obstructive Pulmonary Disease with allergies: Controlled; current treatment: Stiolto 2.5/2.5 mcg 2 puffs daily, albuterol HFA PRN- though has always used 1 puff QAM, never tried not using this, montelukast 10 mg daily, fluticasone nasal spray Enrolled in [employer] assistance for Stiolto for 2022. Lost the box and phone number and didn't know how to call to refill. Provided [employer] phone number today [phone]). She will attempt to have them overnight Stiolto to her.  Recommend to continue current regimen at this time  Hypertension: Controlled; current treatment: telmisartan 80 mg daily, furosemide 40 mg daily Previously recommend to continue current regimen at this time along with PCP f/u  Hx breast cancer: Appropriately managed; current regimen: letrozole 2.5 mg daily Recommend to continue current regimen at this time   Patient Goals/Self-Care Activities Over the next 90 days, patient will:  - take medications as prescribed check blood pressure periodically, document, and provide at  future appointments collaborate with provider on medication access solutions Focus on reduction in tobacco cessation  Follow Up Plan: Telephone follow up appointment with care management team member scheduled for: ~ 12 weeks      Plan: Telephone follow up appointment with care management team member scheduled for:  5 weeks  [name], PharmD, [place], [place] Clinical Pharmacist [employer] [facility] [employer] [phone]

## 2020-12-01 ENCOUNTER — Other Ambulatory Visit: Payer: Self-pay | Admitting: Psychiatry

## 2020-12-01 DIAGNOSIS — F3341 Major depressive disorder, recurrent, in partial remission: Secondary | ICD-10-CM

## 2020-12-05 DIAGNOSIS — I10 Essential (primary) hypertension: Secondary | ICD-10-CM | POA: Diagnosis not present

## 2020-12-05 DIAGNOSIS — J449 Chronic obstructive pulmonary disease, unspecified: Secondary | ICD-10-CM | POA: Diagnosis not present

## 2020-12-07 ENCOUNTER — Encounter: Payer: Self-pay | Admitting: Internal Medicine

## 2020-12-07 ENCOUNTER — Other Ambulatory Visit: Payer: Self-pay

## 2020-12-07 ENCOUNTER — Ambulatory Visit (INDEPENDENT_AMBULATORY_CARE_PROVIDER_SITE_OTHER): Payer: PPO | Admitting: Internal Medicine

## 2020-12-07 VITALS — BP 128/68 | HR 83 | Temp 97.0°F | Ht 64.0 in | Wt 130.0 lb

## 2020-12-07 DIAGNOSIS — G629 Polyneuropathy, unspecified: Secondary | ICD-10-CM

## 2020-12-07 DIAGNOSIS — M47816 Spondylosis without myelopathy or radiculopathy, lumbar region: Secondary | ICD-10-CM

## 2020-12-07 DIAGNOSIS — R269 Unspecified abnormalities of gait and mobility: Secondary | ICD-10-CM

## 2020-12-07 DIAGNOSIS — R200 Anesthesia of skin: Secondary | ICD-10-CM | POA: Diagnosis not present

## 2020-12-07 DIAGNOSIS — Z9889 Other specified postprocedural states: Secondary | ICD-10-CM

## 2020-12-07 DIAGNOSIS — M5416 Radiculopathy, lumbar region: Secondary | ICD-10-CM | POA: Diagnosis not present

## 2020-12-07 DIAGNOSIS — M47812 Spondylosis without myelopathy or radiculopathy, cervical region: Secondary | ICD-10-CM

## 2020-12-07 DIAGNOSIS — R296 Repeated falls: Secondary | ICD-10-CM | POA: Diagnosis not present

## 2020-12-07 DIAGNOSIS — M5412 Radiculopathy, cervical region: Secondary | ICD-10-CM

## 2020-12-07 DIAGNOSIS — Z23 Encounter for immunization: Secondary | ICD-10-CM | POA: Diagnosis not present

## 2020-12-07 DIAGNOSIS — M48061 Spinal stenosis, lumbar region without neurogenic claudication: Secondary | ICD-10-CM | POA: Diagnosis not present

## 2020-12-07 DIAGNOSIS — R937 Abnormal findings on diagnostic imaging of other parts of musculoskeletal system: Secondary | ICD-10-CM

## 2020-12-07 DIAGNOSIS — Z789 Other specified health status: Secondary | ICD-10-CM

## 2020-12-07 DIAGNOSIS — F1090 Alcohol use, unspecified, uncomplicated: Secondary | ICD-10-CM

## 2020-12-07 NOTE — Patient Instructions (Signed)
[facility]  [address] [place]  [phone]  It is important to avoid accidents which may result in broken bones.  Here are a few ideas on how to make your home safer so you will be less likely to trip or fall.  Use nonskid mats or non slip strips in your shower or tub, on your bathroom floor and around sinks.  If you know that you have spilled water, wipe it up! In the bathroom, it is important to have properly installed grab bars on the walls or on the edge of the tub.  Towel racks are NOT strong enough for you to hold onto or to pull on for support. Stairs and hallways should have enough light.  Add lamps or night lights if you need ore light. It is good to have handrails on both sides of the stairs if possible.  Always fix broken handrails right away. It is important to see the edges of steps.  Paint the edges of outdoor steps white so you can see them better.  Put colored tape on the edge of inside steps. Throw-rugs are dangerous because they can slide.  Removing the rugs is the best idea, but if they must stay, add adhesive carpet tape to prevent slipping. Do not keep things on stairs or in the halls.  Remove small furniture that blocks the halls as it may cause you to trip.  Keep telephone and electrical cords out of the way where you walk. Always were sturdy, rubber-soled shoes for good support.  Never wear just socks, especially on the stairs.  Socks may cause you to slip or fall.  Do not wear full-length housecoats as you can easily trip on the bottom.  Place the things you use the most on the shelves that are the easiest to reach.  If you use a stepstool, make sure it is in good condition.  If you feel unsteady, DO NOT climb, ask for help. If a health professional advises you to use a cane or walker, do not be ashamed.  These items can keep you from falling and breaking your bones. Preventing Falls and Fractures  Falls can be very serious, especially  for older adults or people with osteoporosis  Falls can be caused by: Tripping or slipping Slow reflexes Balance problems Reduced muscle strength Poor vision or a recent change in prescription Illness and some medications (especially blood pressure pills, diuretics, heart medicines, muscle relaxants and sleep medications) Drinking alcohol  To prevent falls outdoors: Use a can or walker if needed Wear rubber-soled shoes so you don't slip DO NOT buy "shape up" shoes with rocker bottom soles if you have balance problems.  The thick soles and shape make it more difficult to keep your balance. Put kitty litter or salt on icy sidewalks Walk on the grass if the sidewalks are slick Avoid walking on uneven ground whenever possible  T prevent falls indoors: Keep rooms clutter-free, especially hallways, stairs and paths to light switches Remove throw rugs Install night lights, especially to and in the bathroom Turn on lights before going downstairs Keep a flashlight next to your bed Buy a cordless phone to keep with you instead of jumping up to answer the phone Install grab bars in the bathroom near the shower and toilet Install rails on both sides of the stairs.  Make sure the stairs are well lit Wear slippers with non-skid soles.  Do not walk around in stockings or socks  Balance problems  and dizziness are not a normal part of growing older.  If you begin having balance problems or dizziness see your doctor.  Physical Therapy can help you with many balance problems, strengthening hip and leg muscles and with gait training.  To keep your bones healthy make sure you are getting enough calcium and Vitamin D each day.  Ask your doctor or pharmacist about supplements.  Regular weight-bearing exercise like walking, lifting weights or dancing can help strengthen bones and prevent osteoporosis.

## 2020-12-07 NOTE — Progress Notes (Signed)
Chief Complaint  Patient presents with   Follow-up   F/u  1.  Chronic neck pain and S/p back surgery chronic low back pain  4/10 in 2013 Dr. Ancil Boozer NS wall and disc with him Chronic low back lumbar radiculopathy and chronic low back pain x2 shots not helping with Dr. Murray Hodgkins. She feels off balance  She c/o burning in lower legs and toes and heals and numbness in fingers She has been referred to PT today ns referral she did not go 2. C/o numbness in fingers and off balance, shaking episodes  neurology appt 02/16/2021 further w/u  3. Flu shot today  Review of Systems  Constitutional:  Negative for weight loss.  HENT:  Negative for hearing loss.   Eyes:  Negative for blurred vision.  Respiratory:  Negative for shortness of breath.   Cardiovascular:  Negative for chest pain.  Gastrointestinal:  Negative for abdominal pain and blood in stool.  Genitourinary:  Negative for dysuria.  Musculoskeletal:  Positive for back pain, falls, joint pain and neck pain.  Skin:  Negative for rash.  Neurological:  Positive for sensory change. Negative for headaches.  Psychiatric/Behavioral:  Positive for depression.   Past Medical History:  Diagnosis Date   Anxiety    Breast cancer ([place])    Left breast(nipple mass) 2018   Cervical stenosis of spine    Chicken pox    Chronic back pain    Colon polyps    COPD (chronic obstructive pulmonary disease) (H[facility])    Cyst of left nipple    Depression    Hypertension    Insomnia    Personal history of radiation therapy    Past Surgical History:  Procedure Laterality Date   BACK SURGERY     x 3 (2010 x 2; 2013 x 1)  Dr. Ronnald Ramp   BREAST BIOPSY Left    core- neg   BREAST LUMPECTOMY Left 2018   BREAST SURGERY Left    breast biopsy   CERVICAL CONE BIOPSY     CERVICAL FUSION     times 2   COLONOSCOPY WITH PROPOFOL N/A 04/18/2015   Procedure: COLONOSCOPY WITH PROPOFOL;  Surgeon: [name], MD;  Location: [facility] ENDOSCOPY;  Service:  Endoscopy;  Laterality: N/A;   ELBOW SURGERY     2014 b/l elbows per pt ulnar nerve    ELBOW SURGERY     x2    EPIDURAL BLOCK INJECTION     Dr. Maryjean Ka    MASS EXCISION Left 04/12/2016   Procedure: EXCISION OF LEFT NIPPLE MASS;  Surgeon: Stark Klein, MD;  Location: [facility];  Service: General;  Laterality: Left;   NECK SURGERY     POSTERIOR CERVICAL FUSION/FORAMINOTOMY  03/07/2011   Procedure: POSTERIOR CERVICAL FUSION/FORAMINOTOMY LEVEL 4;  Surgeon: Eustace Moore, MD;  Location: [facility] NEURO ORS;  Service: Neurosurgery;  Laterality: N/A;  cervical three to thoracic one posterior cervical fusion    SENTINEL NODE BIOPSY Left 05/29/2016   Procedure: LEFT SENTINEL LYMPH NODE BIOPSY;  Surgeon: Stark Klein, MD;  Location: [place];  Service: General;  Laterality: Left;   TONSILLECTOMY     Family History  Problem Relation Age of Onset   Breast cancer Mother 34   Hypertension Mother    Cancer Mother        breast dx'ed 71    Cancer Father        prostate   Hypertension Father    Drug abuse Daughter  Hypertension Maternal Grandmother    Breast cancer Cousin    Social History   Socioeconomic History   Marital status: Divorced    Spouse name: Not on file   Number of children: 2   Years of education: Not on file   Highest education level: Not on file  Occupational History   Not on file  Tobacco Use   Smoking status: Every Day    Packs/day: 0.25    Years: 30.00    Pack years: 7.50    Types: Cigarettes   Smokeless tobacco: Never   Tobacco comments:    work days - 6 cigarettes; weekends - ~10 - reported on 10/13/2020  Vaping Use   Vaping Use: Every day  Substance and Sexual Activity   Alcohol use: Yes    Comment: weekends and some today   Drug use: No    Comment: on opana since jan 2018   Sexual activity: Not on file  Other Topics Concern   Not on file  Social History Narrative   As of 01/25/17 not sexually active of in relationship    Divorced   2 kids son  and daughter (though daughter died of suicide in 2017-04-27)   Son lives in [place] now as of 12/2018       Disability    [place] has 3 dogs           Social Determinants of Radio broadcast assistant Strain: Medium Risk   Difficulty of Paying Living Expenses: Somewhat hard  Food Insecurity: No Food Insecurity   Worried About Charity fundraiser in the Last Year: Never true   Arboriculturist in the Last Year: Never true  Transportation Needs: No Transportation Needs   Lack of Transportation (Medical): No   Lack of Transportation (Non-Medical): No  Physical Activity: Not on file  Stress: Stress Concern Present   Feeling of Stress : Rather much  Social Connections: Unknown   Frequency of Communication with Friends and Family: More than three times a week   Frequency of Social Gatherings with Friends and Family: Not on file   Attends Religious Services: Not on file   Active Member of Clubs or Organizations: Not on file   Attends Archivist Meetings: Not on file   Marital Status: Not on file  Intimate Partner Violence: Not At Risk   Fear of Current or Ex-Partner: No   Emotionally Abused: No   Physically Abused: No   Sexually Abused: No   Current Meds  Medication Sig   albuterol (VENTOLIN HFA) 108 (90 Base) MCG/ACT inhaler Inhale 1-2 puffs into the lungs every 4 (four) hours as needed for wheezing or shortness of breath.   buPROPion (WELLBUTRIN XL) 300 MG 24 hr tablet TAKE 1 TABLET BY MOUTH ONCE DAILY *NOTE DOSEAGE CHANGE* **STOP THE 450MG  DAILY**   cyclobenzaprine (FLEXERIL) 10 MG tablet Take 10 mg by mouth every 8 (eight) hours as needed.   DULoxetine (CYMBALTA) 60 MG capsule TAKE 1 CAPSULE BY MOUTH TWICE DAILY   fluticasone (FLONASE) 50 MCG/ACT nasal spray TAKE 2 SPRAYS INTO BOTH NOSTRILS DAILY   furosemide (LASIX) 40 MG tablet Take 1 tablet (40 mg total) by mouth daily.   gabapentin (NEURONTIN) 600 MG tablet Take 1,200 mg by mouth 3 (three) times daily.    hydrOXYzine (VISTARIL) 25 MG capsule TAKE ONE CAPSULE EVERY EIGHT HOURS AS NEEDED FOR ANXIETY   ipratropium (ATROVENT) 0.06 % nasal spray PLACE 2 SPRAYS INTO BOTH  NOSTRILS 3 TIMES DAILY AS NEEDED   lamoTRIgine (LAMICTAL) 25 MG tablet Take 1 tablet (25 mg total) by mouth daily.   letrozole (FEMARA) 2.5 MG tablet Take 1 tablet (2.5 mg total) by mouth daily.   metoprolol succinate (TOPROL-XL) 25 MG 24 hr tablet Take 1 tablet (25 mg total) by mouth daily.   montelukast (SINGULAIR) 10 MG tablet Take 1 tablet (10 mg total) by mouth at bedtime.   Multiple Vitamins-Minerals (MULTIVITAMIN) tablet Take 1 tablet by mouth daily.   oxymorphone (OPANA) 10 MG tablet Take 10 mg by mouth 2 (two) times daily.   SENEXON-S 8.6-50 MG tablet TAKE 1-2 TABLETS BY MOUTH DAILY AS NEEDED FOR MILD CONSTIPATION.   telmisartan (MICARDIS) 80 MG tablet Take 1 tablet (80 mg total) by mouth daily.   Tiotropium Bromide-Olodaterol (STIOLTO RESPIMAT) 2.5-2.5 MCG/ACT AERS Inhale 2 puffs into the lungs daily.   [DISCONTINUED] lamoTRIgine (LAMICTAL) 100 MG tablet Take 1 tablet (100 mg total) by mouth daily. Tale along with 25 mg daily   Allergies  Allergen Reactions   Anastrozole    Augmentin [Amoxicillin-Pot Clavulanate]     Diarrhea    Norvasc [Amlodipine]     Swelling    Topamax [Topiramate]     Didn't like the way made feel more irritatble/sad    Sulfa Antibiotics Itching and Rash   Sulfonamide Derivatives Itching and Rash   Recent Results (from the past 2160 hour(s))  Urine drugs of abuse scrn w alc, routine (Ref Lab)     Status: None   Collection Time: 11/16/20  5:12 PM  Result Value Ref Range   Amphetamines, Urine Negative Cutoff=1000 ng/mL    Comment: Amphetamine test includes Amphetamine and Methamphetamine.   Barbiturate, Ur Negative Cutoff=300 ng/mL   Benzodiazepine Quant, Ur See Final Results Cutoff=300 ng/mL   Cannabinoid Quant, Ur Negative Cutoff=50 ng/mL   Cocaine (Metab.) Negative Cutoff=300 ng/mL    Opiate Quant, Ur Negative Cutoff=300 ng/mL    Comment: Opiate test includes Codeine and Morphine only.   Phencyclidine, Ur Negative Cutoff=25 ng/mL   Methadone Screen, Urine Negative Cutoff=300 ng/mL   Propoxyphene, Urine Negative Cutoff=300 ng/mL   Ethanol U, Quan See Final Results Cutoff=0.020 %    Comment: (NOTE) Performed At: [facility] [address] [place], [place] [id number] Avis Epley PhD [id number]   Drug Profile [id number]     Status: None   Collection Time: 11/16/20  5:12 PM  Result Value Ref Range   BENZODIAZEPINES Negative Cutoff=300    Comment: (NOTE) Performed At: [facility] [address] [place], [place] [id number] Avis Epley PhD AT:[id number]   Ethanol Confirm, Urine     Status: Abnormal   Collection Time: 11/16/20  5:12 PM  Result Value Ref Range   Ethanol, Urine FID) (A) Cutoff=0.020    Comment: Positive Confirmation performed by Gas Chromatography (GC    Ethanol, Ur - Confirmation 0.092 Cutoff=0.020 %    Comment: (NOTE) GLUCOSE NEGATIVE Performed At: [facility] [address] [place], [place] [id number] Avis Epley PhD [id number]   TSH     Status: None   Collection Time: 11/16/20  5:18 PM  Result Value Ref Range   TSH 3.036 0.350 - 4.500 uIU/mL    Comment: Performed by a 3rd Generation assay with a functional sensitivity of <=0.01 uIU/mL. Performed at [facility], [address] [place], [place] [address]   Protime-INR     Status: None   Collection Time: 11/17/20 11:29 AM  Result Value Ref Range   Prothrombin Time 12.8 11.4 - 15.2 seconds   INR 1.0 0.8 - 1.2    Comment: (NOTE) INR goal varies based on device and disease states. Performed at [facility], [address] [place], [place] [address]   APTT     Status: None   Collection Time: 11/17/20 11:29 AM  Result Value Ref Range   aPTT 33 24 - 36 seconds    Comment: Performed at [facility], [address] [place], [place] [address]   CBC     Status: None   Collection Time: 11/17/20 11:29 AM  Result Value Ref Range   WBC 8.2 4.0 - 10.5 K/uL   RBC 3.88 3.87 - 5.11 MIL/uL   Hemoglobin 12.2 12.0 - 15.0 g/dL   HCT 37.6 36.0 - 46.0 %   MCV 96.9 80.0 - 100.0 fL   MCH 31.4 26.0 - 34.0 pg   MCHC 32.4 30.0 - 36.0 g/dL   RDW 14.0 11.5 - 15.5 %   Platelets 252 150 - 400 K/uL   nRBC 0.0 0.0 - 0.2 %    Comment: Performed at [facility], [address] [place], [place] [address]  Differential     Status: None   Collection Time: 11/17/20 11:29 AM  Result Value Ref Range   Neutrophils Relative % 74 %   Neutro Abs 6.0 1.7 - 7.7 K/uL   Lymphocytes Relative 17 %   Lymphs Abs 1.4 0.7 - 4.0 K/uL   Monocytes Relative 7 %   Monocytes Absolute 0.6 0.1 - 1.0 K/uL   Eosinophils Relative 1 %   Eosinophils Absolute 0.1 0.0 - 0.5 K/uL   Basophils Relative 1 %   Basophils Absolute 0.1 0.0 - 0.1 K/uL   Immature Granulocytes 0 %   Abs Immature Granulocytes 0.03 0.00 - 0.07 K/uL    Comment: Performed at [facility], [address] [place], [place] [address]  Comprehensive metabolic panel     Status: None   Collection Time: 11/17/20 11:29 AM  Result Value Ref Range   Sodium 138 135 - 145 mmol/L   Potassium 4.6 3.5 - 5.1 mmol/L   Chloride 100 98 - 111 mmol/L   CO2 28 22 - 32 mmol/L   Glucose, Bld 98 70 - 99 mg/dL    Comment: Glucose reference range applies only to samples taken after fasting for at least 8 hours.   BUN 10 8 - 23 mg/dL   Creatinine, Ser 0.66 0.44 - 1.00 mg/dL   Calcium 9.5 8.9 - 10.3 mg/dL   Total Protein 7.2 6.5 - 8.1 g/dL   Albumin 3.9 3.5 - 5.0 g/dL   AST 22 15 - 41 U/L   ALT 18 0 - 44 U/L   Alkaline Phosphatase 99 38 - 126 U/L   Total Bilirubin 0.7 0.3 - 1.2 mg/dL   GFR, Estimated >60 >60 mL/min    Comment: (NOTE) Calculated using the CKD-EPI Creatinine Equation (2021)    Anion gap 10 5 - 15    Comment: Performed at [facility], [address] [place], [place] [address]    Objective  Body mass index is 22.31 kg/m. Wt Readings from Last 3 Encounters:  12/07/20 130 lb (59 kg)  11/15/20 135 lb (61.2 kg)  10/03/20 129 lb (58.5 kg)   Temp Readings from Last 3 Encounters:  12/07/20 (!) 97 F (36.1 C) (Temporal)  11/17/20 98.7 F (37.1 C) (Oral)  08/05/20 98.9 F (  37.2 C) (Oral)   BP Readings from Last 3 Encounters:  12/07/20 128/68  11/17/20 129/75  11/15/20 133/78   Pulse Readings from Last 3 Encounters:  12/07/20 83  11/17/20 88  11/15/20 86    Physical Exam Vitals and nursing note reviewed.  Constitutional:      Appearance: Normal appearance. She is well-developed and well-groomed.  HENT:     Head: Normocephalic and atraumatic.  Eyes:     Conjunctiva/sclera: Conjunctivae normal.     Pupils: Pupils are equal, round, and reactive to light.  Cardiovascular:     Rate and Rhythm: Normal rate and regular rhythm.     Heart sounds: Normal heart sounds. No murmur heard. Pulmonary:     Effort: Pulmonary effort is normal.     Breath sounds: Normal breath sounds.  Abdominal:     General: Abdomen is flat. Bowel sounds are normal.     Tenderness: There is no abdominal tenderness.  Musculoskeletal:        General: No tenderness.  Skin:    General: Skin is warm and dry.  Neurological:     General: No focal deficit present.     Mental Status: She is alert and oriented to person, place, and time. Mental status is at baseline.     Cranial Nerves: Cranial nerves 2-12 are intact.     Gait: Gait is intact.  Psychiatric:        Attention and Perception: Attention and perception normal.        Mood and Affect: Mood and affect normal.        Speech: Speech normal.        Behavior: Behavior normal. Behavior is cooperative.        Thought Content: Thought content normal.        Cognition and Memory: Cognition and memory normal.        Judgment: Judgment normal.    Assessment  Plan  Lumbar radiculopathy with h/o SS/arthritis and abnormal cervical  MRI with radiculopathy and numbness b/l hands- Plan: Ambulatory referral to Physical Therapy Sp 2 inj Dr. Murray Hodgkins not helping  Ashley Cross NS [phone] trying to get appt with Dr. Ronnald Ramp to see what further w/u should be done Consider EMG/N[facility] Neurology appt 02/16/21  H/O cervical spine surgery with now arthritis - Plan: Ambulatory referral to Physical Therapy   Abnormal MRI, cervical spine - Plan: Ambulatory referral to Physical Therapy Abnormal MRI, lumbar spine - Plan: Ambulatory referral to Physical Therapy Abnormal gait - Plan: Ambulatory referral to Physical Therapy  Frequent falls - Plan: Ambulatory referral to Physical Therapy Neurology appt 02/16/21  HM Flu shot utd given today Tdap UTD  pna 23 utd prevnar due covid shot 3/3 rec shingrix vaccine as well for future    Pap 06/12/19 neg neg HPV   breast exam 06/12/19 normal  [place] sch 05/18/20 and mammo with lymph swelling and pain will try to do dexa   mammo with h/o left breast cancer  04/12/16 invasive ductal ca grade 1 0.8 cm  -h/o breast cancer left  -letrozole likely causing sweating/hot flashes she will f/u with h/o in 08/2018  06/08/19 negative mammogram had f/u [name] 09/06/20 Dr. Barry Dienes surgery following as well   dexa 05/01/17 osteopenia rec calcium and vitamin D repeat in 3-5 years    Colonoscopy had 04/18/15 see report tubular adenomas repeat in 5 years [facility]  06/20/20 or 6/22 wants to go back [facility] Dr. Ivor Messier since she had  a polyp removed in a difficult area on he did this -referred [facility] 04/2020 for f/u 06/2020 not seen as of 12/07/20   Hep C neg 01/20/16    Ct ab pelvis 08/26/20  IMPRESSION: 1. No acute abdominal/pelvic findings, mass lesions or adenopathy. 2. Stable left adrenal gland adenoma. 3. Mild central intrahepatic and common bile duct dilatation without cause. Recommend correlation with liver function studies. 4. Aortic atherosclerosis + left renal artery +   Aortic Atherosclerosis  (ICD10-I70.0).     Electronically Signed   By: Marijo Sanes M.D.   On: 08/28/2020 14:54 rec smoking cessation COPD at least 10 cig per day smoking as of 06/12/19 -07/02/19 on Stiolto per catie pharm D was on anoro but became costly and prn albuterol    Saw Dr. Maryjean Ka 07/2018 chronic neck and back pain as of summer 2022 Dr. Shearon Stalls   Provider: Dr. Karlyn Agee  Dr. Maryjean Ka left practice -[facility]

## 2020-12-13 ENCOUNTER — Other Ambulatory Visit: Payer: Self-pay | Admitting: Psychiatry

## 2020-12-13 ENCOUNTER — Other Ambulatory Visit: Payer: Self-pay

## 2020-12-13 ENCOUNTER — Ambulatory Visit (INDEPENDENT_AMBULATORY_CARE_PROVIDER_SITE_OTHER): Payer: PPO | Admitting: Licensed Clinical Social Worker

## 2020-12-13 DIAGNOSIS — F339 Major depressive disorder, recurrent, unspecified: Secondary | ICD-10-CM

## 2020-12-13 NOTE — Progress Notes (Signed)
Virtual Visit via Video Note  I connected with Ashley Cross on 12/13/20 at  4:00 PM EST by a video enabled telemedicine application and verified that I am speaking with the correct person using two identifiers.  Video connection was lost when less than 50% of the duration of the visit was complete, at which time the remainder of the visit was completed via audio only.  Location: Patient: home Provider: remote office [place], [place])   I discussed the limitations of evaluation and management by telemedicine and the availability of in person appointments. The patient expressed understanding and agreed to proceed.  I discussed the assessment and treatment plan with the patient. The patient was provided an opportunity to ask questions and all were answered. The patient agreed with the plan and demonstrated an understanding of the instructions.   The patient was advised to call back or seek an in-person evaluation if the symptoms worsen or if the condition fails to improve as anticipated.  I provided 60 minutes of non-face-to-face time during this encounter.   Ashley Lagace R Rashon Rezek, Ashley Cross  THERAPIST PROGRESS NOTE  Session Time: 4-5p  Participation Level: Active  Behavioral Response: NAAlertAnxious and Depressed  Type of Therapy: Individual Therapy  Treatment Goals addressed:  Problem: Decrease depressive symptoms and improve levels of effective functioning Goal: LTG: Reduce frequency, intensity, and duration of depression symptoms as evidenced by: SSB input needed on appropriate metric Outcome: Progressing Goal: STG: Ashley Cross WILL PARTICIPATE IN AT LEAST 80% OF SCHEDULED INDIVIDUAL PSYCHOTHERAPY SESSIONS Outcome: Progressing Interventions:  Intervention: WORK WITH Ashley Cross TO TRACK SYMPTOMS, TRIGGERS AND/OR SKILL USE THROUGH A MOOD CHART, DIARY CARD, OR JOURNAL Intervention: WORK WITH Ashley Cross TO IDENTIFY THE MAJOR COMPONENTS OF A RECENT EPISODE OF DEPRESSION: PHYSICAL SYMPTOMS,  MAJOR THOUGHTS AND IMAGES, AND MAJOR BEHAVIORS THEY EXPERIENCED Intervention: WORK WITH Ashley Cross TO CREATE A WEEKLY ACTIVITY SCHEDULE Intervention: REVIEW PLEASE SKILLS (TREAT PHYSICAL ILLNESS, BALANCE EATING, AVOID MOOD-ALTERING SUBSTANCES, BALANCE SLEEP AND GET EXERCISE) WITH Ashley Cross Summary: Ashley Cross is a 65 y.o. female who presents with improving symptoms related to depression diagnosis. Pt reports that she has been compliant with medication and feels that meds are managing depression and anxiety symptoms.    Pt denies that she is drinking alcohol--pt states that she had a martini the night prior to her hospital visit recently. Reviewed pts medications with her and cautioned her about drinking with meds and to watch the timing of taking muscle relaxers and pain medication at the same time.  Pt is concerned about memory lapses. "I don't remember anything that happened in that whole week".  Pt has had medical workup with no significant findings.  Allowed pt to explore and express thoughts and feelings associated with recent life situations and external stressors. Explored relationships from the past--ex boyfriends and ex husband. Pt states that she wants to be in a relationship so bad--doesn't understand why she is not in one right now. Discussed chores/projects that pt needs to accomplish in her home. Reviewed ways of getting it done--breaking larger projects into smaller, more manageable tasks.   Pt made statement that she wants to work more and be at home less "I don't want to be there alone".   Pt states that she has some recent changes in VH:  pt will "see" her daughter very briefly in her peripheral vision. Pt also states that she will see "stick people" outside sometimes but they don't do anything or say anything.  Pt states that she has spoken to her  PCP about all of her neurological symptoms--including her neurologist. Offered pt unconditional support. Pts PCP was concerned and  sent EMS to pts home--EMS had capability to oopen the door since pt was nonresponsive to knocking. Pt feels "traumatized" by seeing EMS workers and police officer standing in her home.   Pt continuing to have automatic negative thoughts about self--used CBT to help pt identify the thoughts/assumptions and to put her thoughts on trial.   Continued recommendations are as follows: self care behaviors, positive social engagements, focusing on overall work/home/life balance, and focusing on positive physical and emotional wellness.  .  Suicidal/Homicidal: No  Therapist Response: Pt is continuing to apply interventions learned in session into daily life situations. Pt is currently on track to meet goals utilizing interventions mentioned above. Personal growth and progress noted. Treatment to continue as indicated.   Plan: Return again in 4 weeks.  Diagnosis: Axis I: MDD, recurrent, moderate    Axis II: No diagnosis    Scra[place], Ashley Cross 12/13/2020

## 2020-12-13 NOTE — Plan of Care (Signed)
  Problem: Decrease depressive symptoms and improve levels of effective functioning Goal: LTG: Reduce frequency, intensity, and duration of depression symptoms as evidenced by: SSB input needed on appropriate metric Outcome: Progressing Goal: STG: [place] WILL PARTICIPATE IN AT LEAST 80% OF SCHEDULED INDIVIDUAL PSYCHOTHERAPY SESSIONS Outcome: Progressing Intervention: WORK WITH Caren Griffins TO TRACK SYMPTOMS, TRIGGERS AND/OR SKILL USE THROUGH A MOOD CHART, DIARY CARD, OR JOURNAL Intervention: WORK WITH [place] TO IDENTIFY THE MAJOR COMPONENTS OF A RECENT EPISODE OF DEPRESSION: PHYSICAL SYMPTOMS, MAJOR THOUGHTS AND IMAGES, AND MAJOR BEHAVIORS THEY EXPERIENCED Intervention: WORK WITH Caren Griffins TO CREATE A WEEKLY ACTIVITY SCHEDULE Intervention: REVIEW PLEASE SKILLS (TREAT PHYSICAL ILLNESS, BALANCE EATING, AVOID MOOD-ALTERING SUBSTANCES, BALANCE SLEEP AND GET EXERCISE) WITH Caren Griffins

## 2020-12-20 ENCOUNTER — Encounter: Payer: Self-pay | Admitting: Internal Medicine

## 2020-12-20 DIAGNOSIS — M48061 Spinal stenosis, lumbar region without neurogenic claudication: Secondary | ICD-10-CM | POA: Insufficient documentation

## 2020-12-20 DIAGNOSIS — R296 Repeated falls: Secondary | ICD-10-CM | POA: Insufficient documentation

## 2020-12-20 DIAGNOSIS — R269 Unspecified abnormalities of gait and mobility: Secondary | ICD-10-CM | POA: Insufficient documentation

## 2020-12-20 DIAGNOSIS — M47816 Spondylosis without myelopathy or radiculopathy, lumbar region: Secondary | ICD-10-CM | POA: Insufficient documentation

## 2020-12-20 DIAGNOSIS — R937 Abnormal findings on diagnostic imaging of other parts of musculoskeletal system: Secondary | ICD-10-CM | POA: Insufficient documentation

## 2020-12-20 DIAGNOSIS — Z9889 Other specified postprocedural states: Secondary | ICD-10-CM | POA: Insufficient documentation

## 2020-12-20 DIAGNOSIS — R200 Anesthesia of skin: Secondary | ICD-10-CM | POA: Insufficient documentation

## 2020-12-20 DIAGNOSIS — M5412 Radiculopathy, cervical region: Secondary | ICD-10-CM | POA: Insufficient documentation

## 2020-12-20 DIAGNOSIS — M47812 Spondylosis without myelopathy or radiculopathy, cervical region: Secondary | ICD-10-CM | POA: Insufficient documentation

## 2020-12-22 ENCOUNTER — Other Ambulatory Visit: Payer: Self-pay | Admitting: Neurology

## 2020-12-22 MED ORDER — LAMOTRIGINE 100 MG PO TABS: 100.0000 mg | ORAL_TABLET | Freq: Every day | ORAL | 0 refills | Status: DC

## 2020-12-22 NOTE — Telephone Encounter (Signed)
Pt states Dr April Manson told her he would take over her prescription for,  lamoTRIgine (LAMICTAL) 100 MG tablet pt states Dr April Manson now wants her to take 2 a day.  Pt is asking that a revised prescription be sent to her pharmacy stating the 100 mg twice a day.

## 2020-12-22 NOTE — Telephone Encounter (Addendum)
I spoke to the patient. She has completed her medication titration and is now taking lamotrigine 100mg  BID. She is doing well on the medication. No adverse side effects. New  90-day rx will be sent to Dr. April Manson for approval.

## 2021-01-02 ENCOUNTER — Other Ambulatory Visit: Payer: Self-pay | Admitting: Internal Medicine

## 2021-01-02 DIAGNOSIS — K5900 Constipation, unspecified: Secondary | ICD-10-CM

## 2021-01-03 NOTE — Addendum Note (Signed)
Addended by: Orland Mustard on: 01/03/2021 05:18 PM   Modules accepted: Orders

## 2021-01-04 ENCOUNTER — Other Ambulatory Visit: Payer: Self-pay | Admitting: Neurology

## 2021-01-04 NOTE — Telephone Encounter (Signed)
Pt is asking for a call to discuss recent seizure activity. Pt had another seizure as recent as yesterday on her job.  Pt states she fell and had great difficulty in getting up.  Pt is home and still somewhat shaky, please call.

## 2021-01-04 NOTE — Telephone Encounter (Signed)
Left message requesting a call back from the patient.   We need to get further information about the event (description, length of time it lasted), confirm her medication (include asking about any missed doses) and make sure she is currently not driving.  Records indicate she should be taking lamotrigine 100mg , one tab BID.

## 2021-01-04 NOTE — Telephone Encounter (Signed)
Pt returned call. Please call back when available. 

## 2021-01-05 MED ORDER — LAMOTRIGINE 200 MG PO TABS: 200.0000 mg | ORAL_TABLET | Freq: Every day | ORAL | 5 refills | Status: DC

## 2021-01-05 MED ORDER — LAMOTRIGINE 25 MG PO TABS: ORAL_TABLET | ORAL | 0 refills | Status: DC

## 2021-01-05 NOTE — Addendum Note (Signed)
Addended by: Noberto Retort C on: 01/05/2021 10:06 AM   Modules accepted: Orders

## 2021-01-05 NOTE — Telephone Encounter (Signed)
Reports a witnessed seizure while at work on 01/04/21. She was told she fell to the ground, full body shaky, weakness. Lasted a couple minutes. She felt fatigued for the remainder of the day. She confirmed she takes lamotrigine 100mg  BID. Denies any missed doses. She drove home after this occur.   I reviewed [place] law with her. No driving until six months episode free. She verbalized understanding.  Per vo by Dr. April Manson, she should titrate up the lamotrigine dosage to 200mg  BID. She already has 100mg  tablet BID. She will be provided with a supply of 25mg  for the titration period.  She will eventually will just take lamotrigine 200mg  tablets, one BID.

## 2021-01-11 ENCOUNTER — Telehealth (INDEPENDENT_AMBULATORY_CARE_PROVIDER_SITE_OTHER): Payer: PPO | Admitting: Psychiatry

## 2021-01-11 ENCOUNTER — Other Ambulatory Visit: Payer: Self-pay

## 2021-01-11 ENCOUNTER — Encounter: Payer: Self-pay | Admitting: Psychiatry

## 2021-01-11 ENCOUNTER — Telehealth (HOSPITAL_COMMUNITY): Payer: Self-pay | Admitting: Licensed Clinical Social Worker

## 2021-01-11 ENCOUNTER — Telehealth: Payer: Self-pay | Admitting: Pharmacy Technician

## 2021-01-11 DIAGNOSIS — F1020 Alcohol dependence, uncomplicated: Secondary | ICD-10-CM | POA: Diagnosis not present

## 2021-01-11 DIAGNOSIS — Z596 Low income: Secondary | ICD-10-CM

## 2021-01-11 DIAGNOSIS — F17200 Nicotine dependence, unspecified, uncomplicated: Secondary | ICD-10-CM | POA: Diagnosis not present

## 2021-01-11 DIAGNOSIS — F339 Major depressive disorder, recurrent, unspecified: Secondary | ICD-10-CM | POA: Diagnosis not present

## 2021-01-11 MED ORDER — DULOXETINE HCL 60 MG PO CPEP: 60.0000 mg | ORAL_CAPSULE | Freq: Two times a day (BID) | ORAL | 1 refills | Status: DC

## 2021-01-11 NOTE — Progress Notes (Signed)
Virtual Visit via Video Note  I connected with Ashley Cross on 01/11/21 at 11:00 AM EST by a video enabled telemedicine application and verified that I am speaking with the correct person using two identifiers.  Location Provider Location : [facility] Patient Location : Home  Participants: Patient , Provider    I discussed the limitations of evaluation and management by telemedicine and the availability of in person appointments. The patient expressed understanding and agreed to proceed.     I discussed the assessment and treatment plan with the patient. The patient was provided an opportunity to ask questions and all were answered. The patient agreed with the plan and demonstrated an understanding of the instructions.   The patient was advised to call back or seek an in-person evaluation if the symptoms worsen or if the condition fails to improve as anticipated.  [place] MD OP Progress Note  01/11/2021 4:44 PM Ileta Ofarrell  MRN:  [id number]  Chief Complaint:  Chief Complaint   Follow-up; Anxiety; Depression    HPI: Ashley Cross is a 65 year old Caucasian female on disability, lives in [place], has a history of MDD, history of breast cancer, chronic pain, hypertension was evaluated by telemedicine today.  Patient's last appointment with writer was on 11/16/2020.  A day after the appointment patient presented to the office confused, tearful.  Patient at that time reported no memory of being at this office the previous day.  Since patient appeared to have slurred speech and appeared to look confused she was taken to the emergency department that day.  Patient however while in the emergency department reported having a drink of gin prior to her visit to the ED.  Patient also left the emergency department at the end of the day without completing her evaluation.  Patient today returns reporting that she continues to have no memory of being at this office in October.  She  however does remember her visit to the emergency department and reports that she had a drink the previous night and not the day of her ED visit.  Patient today reports she continues to drink 2 glasses ( big) of wine every night.  Patient also reports drinking gin some days.  Patient completed AUDIT in session today.  Scored high on the same.  Patient continues to struggle with mood symptoms however discussed with patient since she needs help with her alcoholism will not make medication changes today.  Patient is agreeable to being referred for CD IOP program today.  She denies suicidality, homicidality or perceptual disturbances.  Patient is also interested in referral to [place] therapy which was discussed previously.  Patient denies any other concerns today.  Visit Diagnosis:    ICD-10-CM   1. MDD (major depressive disorder), recurrent episode, with atypical features (HCC)  F33.9 DULoxetine (CYMBALTA) 60 MG capsule   moderate    2. Alcohol use disorder, moderate, dependence (HCC)  F10.20     3. Tobacco use disorder  F17.200       Past Psychiatric History: Reviewed past psychiatric history from progress note on 09/16/2018.  Past trials of Lexapro, Zoloft, Wellbutrin, Celexa  Past Medical History:  Past Medical History:  Diagnosis Date   Anxiety    Breast cancer ([place])    Left breast(nipple mass) 2018   Cervical stenosis of spine    Chicken pox    Chronic back pain    Colon polyps    COPD (chronic obstructive pulmonary disease) (HCC)    Cyst of  left nipple    Depression    Hypertension    Insomnia    Personal history of radiation therapy     Past Surgical History:  Procedure Laterality Date   BACK SURGERY     x 3 (May 13, 2008 x 2; 05/14/2011 x 1)  Dr. Ronnald Ramp   BREAST BIOPSY Left    core- neg   BREAST LUMPECTOMY Left May 13, 2016   BREAST SURGERY Left    breast biopsy   CERVICAL CONE BIOPSY     CERVICAL FUSION     times 2   COLONOSCOPY WITH PROPOFOL N/A 04/18/2015   Procedure: COLONOSCOPY  WITH PROPOFOL;  Surgeon: [name], MD;  Location[facility] ENDOSCOPY;  Service: Endoscopy;  Laterality: N/A;   ELBOW SURGERY     05-13-2012 b/l elbows per pt ulnar nerve    ELBOW SURGERY     x2    EPIDURAL BLOCK INJECTION     Dr. Maryjean Ka    MASS EXCISION Left 04/12/2016   Procedure: EXCISION OF LEFT NIPPLE MASS;  Surgeon: Stark Klein, MD;  Location: [facility];  Service: General;  Laterality: Left;   NECK SURGERY     POSTERIOR CERVICAL FUSION/FORAMINOTOMY  03/07/2011   Procedure: POSTERIOR CERVICAL FUSION/FORAMINOTOMY LEVEL 4;  Surgeon: Eustace Moore, MD;  Location: [facility] NEURO ORS;  Service: Neurosurgery;  Laterality: N/A;  cervical three to thoracic one posterior cervical fusion    SENTINEL NODE BIOPSY Left 05/29/2016   Procedure: LEFT SENTINEL LYMPH NODE BIOPSY;  Surgeon: Stark Klein, MD;  Location: [facility];  Service: General;  Laterality: Left;   TONSILLECTOMY      Family Psychiatric History: Reviewed family psychiatric history from progress note on 09/16/2018.  Family History:  Family History  Problem Relation Age of Onset   Breast cancer Mother 46   Hypertension Mother    Cancer Mother        breast dx'ed 75    Cancer Father        prostate   Hypertension Father    Drug abuse Daughter    Hypertension Maternal Grandmother    Breast cancer Cousin     Social History: Reviewed social history from progress note on 09/16/2018. Social History   Socioeconomic History   Marital status: Divorced    Spouse name: Not on file   Number of children: 2   Years of education: Not on file   Highest education level: Not on file  Occupational History   Not on file  Tobacco Use   Smoking status: Every Day    Packs/day: 0.25    Years: 30.00    Pack years: 7.50    Types: Cigarettes   Smokeless tobacco: Never   Tobacco comments:    work days - 6 cigarettes; weekends - ~10 - reported on 10/13/2020  Vaping Use   Vaping Use: Every day  Substance and Sexual Activity    Alcohol use: Yes    Comment: weekends and some today   Drug use: No    Comment: on opana since jan 2018   Sexual activity: Not on file  Other Topics Concern   Not on file  Social History Narrative   As of 01/25/17 not sexually active of in relationship    Divorced   2 kids son and daughter (though daughter died of suicide in 05/13/2017)   Son lives in [place] now as of 12/2018       Eddy has 3 dogs  Social Determinants of Health   Financial Resource Strain: Medium Risk   Difficulty of Paying Living Expenses: Somewhat hard  Food Insecurity: No Food Insecurity   Worried About Charity fundraiser in the Last Year: Never true   Ran Out of Food in the Last Year: Never true  Transportation Needs: No Transportation Needs   Lack of Transportation (Medical): No   Lack of Transportation (Non-Medical): No  Physical Activity: Not on file  Stress: Stress Concern Present   Feeling of Stress : Rather much  Social Connections: Unknown   Frequency of Communication with Friends and Family: More than three times a week   Frequency of Social Gatherings with Friends and Family: Not on file   Attends Religious Services: Not on file   Active Member of Clubs or Organizations: Not on file   Attends Archivist Meetings: Not on file   Marital Status: Not on file    Allergies:  Allergies  Allergen Reactions   Anastrozole    Augmentin [Amoxicillin-Pot Clavulanate]     Diarrhea    Norvasc [Amlodipine]     Swelling    Topamax [Topiramate]     Didn't like the way made feel more irritatble/sad    Sulfa Antibiotics Itching and Rash   Sulfonamide Derivatives Itching and Rash    Metabolic Disorder Labs: Lab Results  Component Value Date   HGBA1C 5.4 04/28/2020   No results found for: PROLACTIN Lab Results  Component Value Date   CHOL 186 04/28/2020   TRIG 73.0 04/28/2020   HDL 95.00 04/28/2020   CHOLHDL 2 04/28/2020   VLDL 14.6 04/28/2020    LDLCALC 76 04/28/2020   LDLCALC 85 08/25/2019   Lab Results  Component Value Date   TSH 3.036 11/16/2020   TSH 0.71 08/25/2019    Therapeutic Level Labs: No results found for: LITHIUM No results found for: VALPROATE No components found for:  CBMZ  Current Medications: Current Outpatient Medications  Medication Sig Dispense Refill   albuterol (VENTOLIN HFA) 108 (90 Base) MCG/ACT inhaler Inhale 1-2 puffs into the lungs every 4 (four) hours as needed for wheezing or shortness of breath. 18 g 12   buPROPion (WELLBUTRIN XL) 300 MG 24 hr tablet TAKE 1 TABLET BY MOUTH ONCE DAILY *NOTE DOSEAGE CHANGE* **STOP THE 450MG  DAILY** 90 tablet 1   cyclobenzaprine (FLEXERIL) 10 MG tablet Take 10 mg by mouth every 8 (eight) hours as needed.     DULoxetine (CYMBALTA) 60 MG capsule Take 1 capsule (60 mg total) by mouth 2 (two) times daily. 60 capsule 1   fluticasone (FLONASE) 50 MCG/ACT nasal spray TAKE 2 SPRAYS INTO BOTH NOSTRILS DAILY 16 g 11   furosemide (LASIX) 40 MG tablet Take 1 tablet (40 mg total) by mouth daily. 90 tablet 3   gabapentin (NEURONTIN) 600 MG tablet Take 1,200 mg by mouth 3 (three) times daily.     hydrOXYzine (VISTARIL) 25 MG capsule TAKE ONE CAPSULE EVERY EIGHT HOURS AS NEEDED FOR ANXIETY 90 capsule 1   ipratropium (ATROVENT) 0.06 % nasal spray PLACE 2 SPRAYS INTO BOTH NOSTRILS 3 TIMES DAILY AS NEEDED 15 mL 12   lamoTRIgine (LAMICTAL) 100 MG tablet Take 1 tablet (100 mg total) by mouth daily. 180 tablet 0   lamoTRIgine (LAMICTAL) 200 MG tablet Take 1 tablet (200 mg total) by mouth daily. 60 tablet 5   lamoTRIgine (LAMICTAL) 25 MG tablet Titration: In addition to the 100mg  tabs BID. Take one 25mg  tablet BID x 1 week, then  two 25mg  tabs BID x 1 week, then three 25mg  tabs BID x 1 week, then transition to lamotrigine 200mg , one tab BID (separate rx on file). 84 tablet 0   letrozole (FEMARA) 2.5 MG tablet Take 1 tablet (2.5 mg total) by mouth daily. 90 tablet 3   metoprolol succinate  (TOPROL-XL) 25 MG 24 hr tablet Take 1 tablet (25 mg total) by mouth daily. 90 tablet 3   montelukast (SINGULAIR) 10 MG tablet Take 1 tablet (10 mg total) by mouth at bedtime. 90 tablet 3   Multiple Vitamins-Minerals (MULTIVITAMIN) tablet Take 1 tablet by mouth daily. 90 tablet 3   oxymorphone (OPANA) 10 MG tablet Take 10 mg by mouth 2 (two) times daily.     SENEXON-S 8.6-50 MG tablet TAKE 1-2 TABLETS BY MOUTH DAILY AS NEEDED FOR MILD CONSTIPATION. 180 tablet 1   telmisartan (MICARDIS) 80 MG tablet Take 1 tablet (80 mg total) by mouth daily. 90 tablet 3   Tiotropium Bromide-Olodaterol (STIOLTO RESPIMAT) 2.5-2.5 MCG/ACT AERS Inhale 2 puffs into the lungs daily. 4 g 11   No current facility-administered medications for this visit.     Musculoskeletal: Strength & Muscle Tone:  UTA Gait & Station:  Seated Patient leans: N/A  Psychiatric Specialty Exam: Review of Systems  Psychiatric/Behavioral:  Positive for dysphoric mood. The patient is nervous/anxious.   All other systems reviewed and are negative.  There were no vitals taken for this visit.There is no height or weight on file to calculate BMI.  General Appearance: Casual  Eye Contact:  Fair  Speech:  Clear and Coherent  Volume:  Normal  Mood:  Anxious and Depressed  Affect:  Congruent  Thought Process:  Goal Directed and Descriptions of Associations: Intact  Orientation:  Full (Time, Place, and Person)  Thought Content: Logical   Suicidal Thoughts:  No  Homicidal Thoughts:  No  Memory:  Immediate;   Fair Recent;   Fair Remote;   Fair  Judgement:  Fair  Insight:  Fair  Psychomotor Activity:  Normal  Concentration:  Concentration: Fair and Attention Span: Fair  Recall:  [employer] of Knowledge: Fair  Language: Fair  Akathisia:  No  Handed:  Right  AIMS (if indicated): done, 0  Assets:  Communication Skills Desire for Improvement Housing Social Support  ADL's:  Intact  Cognition: WNL  Sleep:  Fair    Screenings: AUDIT    Flowsheet Row Video Visit from 01/11/2021 in [facility]  Alcohol Use Disorder Identification Test Final Score (AUDIT) 11      GAD-7    Flowsheet Row Office Visit from 06/12/2019 in [place] Office Visit from 09/30/2017 in [facility]  Total GAD-7 Score 18 18      PHQ2-9    [place] Visit from 11/16/2020 in [facility] Video Visit from 06/29/2020 in [facility] Video Visit from 06/02/2020 in [facility] Video Visit from 05/09/2020 in [facility] from 04/12/2020 in [place]  PHQ-2 Total Score 6 0 2 1 2   PHQ-9 Total Score 20 4 7 12 13       Flowsheet Row Counselor from 12/13/2020 in [place] Visit from 11/16/2020 in [facility] Video Visit from 09/16/2020 in [facility] No Risk No Risk No Risk        Assessment and Plan: Ariane Ditullio is a  65 year old Caucasian female, divorced, disabled, has a history of depression, chronic pain, hypertension, history of breast cancer was evaluated by telemedicine today.  Patient with recent seizure-like spells, completed neurological evaluation, had recent AMS and was evaluated in the emergency department, likely alcohol induced.  Patient continues to drink alcohol moderately, every day.  Patient will benefit from alcohol treatment which was discussed with patient.  Discussed plan as noted below.  Plan MDD-unstable Patient will benefit from alcohol treatment at this time since she continues to drink alcohol and is mixing alcohol with her medications.  She has not been able to cut back much. Refer for CD IOP-I have sent communication to [facility] as  prescribed by her provider. Cymbalta 60 mg p.o. twice daily Referral for [place].  I have sent a referral to Adrienne Mocha [place] again today.   Alcohol use disorder moderate-unstable Will refer for CD IOP Patient had previously reported drinking 1-2 drinks on weekends only however had recent AMS, likely alcohol induced and was more forthcoming about her alcohol use.  Currently drinks 2 big glasses of wine every night, Gin at times. Patient to also continue CBT.  Tobacco use disorder-improving Will monitor closely.  Provided counseling.  Discussed with patient further medication treatment for her depression cannot be done since she is mixing medications with alcohol and her mood symptoms likely substance induced as well.  Patient to complete alcohol abuse treatment program and make a follow-up appointment with Probation officer.  Patient agrees with plan.  We will coordinate care with her primary care provider.  I have also communicated with her therapist Ms. Christina Hussami  This note was generated in part or whole with voice recognition software. Voice recognition is usually quite accurate but there are transcription errors that can and very often do occur. I apologize for any typographical errors that were not detected and corrected.      Ursula Alert, MD 01/12/2021, 8:14 AM

## 2021-01-11 NOTE — Progress Notes (Signed)
[facility])                                            [place] Team    01/11/2021  Zadie Deemer 11-20-1955 [id number]  FOR 2023 RE ENROLLMENT                                      Medication Assistance Referral  Referral From: [facility] Embedded RPh [name] T.   Medication/Company: [employer] / BI Patient application portion:  Mailed Provider application portion: Interoffice Mailed to Dr. Terese Door Provider address/fax verified via: Office website  Received both patient and provider portion(s) of patient assistance application(s) for [employer]. Faxed completed application and required documents into BI.    Ludell Zacarias P. Olufemi Mofield, [place]  [phone]

## 2021-01-26 ENCOUNTER — Ambulatory Visit: Payer: PPO | Admitting: Licensed Clinical Social Worker

## 2021-02-08 DIAGNOSIS — M5416 Radiculopathy, lumbar region: Secondary | ICD-10-CM | POA: Diagnosis not present

## 2021-02-08 DIAGNOSIS — G894 Chronic pain syndrome: Secondary | ICD-10-CM | POA: Diagnosis not present

## 2021-02-08 DIAGNOSIS — M961 Postlaminectomy syndrome, not elsewhere classified: Secondary | ICD-10-CM | POA: Diagnosis not present

## 2021-02-08 DIAGNOSIS — M546 Pain in thoracic spine: Secondary | ICD-10-CM | POA: Diagnosis not present

## 2021-02-16 ENCOUNTER — Other Ambulatory Visit: Payer: Self-pay

## 2021-02-16 ENCOUNTER — Telehealth: Payer: Self-pay | Admitting: Pharmacy Technician

## 2021-02-16 ENCOUNTER — Emergency Department: Payer: PPO

## 2021-02-16 ENCOUNTER — Inpatient Hospital Stay
Admission: EM | Admit: 2021-02-16 | Discharge: 2021-02-18 | DRG: 682 | Disposition: A | Discharge status: Home / Self Care | Payer: PPO | Attending: Internal Medicine | Admitting: Internal Medicine

## 2021-02-16 ENCOUNTER — Ambulatory Visit: Payer: PPO | Admitting: Neurology

## 2021-02-16 DIAGNOSIS — Z86010 Personal history of colonic polyps: Secondary | ICD-10-CM

## 2021-02-16 DIAGNOSIS — W19XXXA Unspecified fall, initial encounter: Secondary | ICD-10-CM | POA: Diagnosis not present

## 2021-02-16 DIAGNOSIS — Z803 Family history of malignant neoplasm of breast: Secondary | ICD-10-CM

## 2021-02-16 DIAGNOSIS — Z789 Other specified health status: Secondary | ICD-10-CM | POA: Diagnosis present

## 2021-02-16 DIAGNOSIS — Z79899 Other long term (current) drug therapy: Secondary | ICD-10-CM

## 2021-02-16 DIAGNOSIS — Z8619 Personal history of other infectious and parasitic diseases: Secondary | ICD-10-CM

## 2021-02-16 DIAGNOSIS — R52 Pain, unspecified: Secondary | ICD-10-CM

## 2021-02-16 DIAGNOSIS — Z79811 Long term (current) use of aromatase inhibitors: Secondary | ICD-10-CM

## 2021-02-16 DIAGNOSIS — G9341 Metabolic encephalopathy: Secondary | ICD-10-CM | POA: Diagnosis present

## 2021-02-16 DIAGNOSIS — M549 Dorsalgia, unspecified: Secondary | ICD-10-CM | POA: Diagnosis present

## 2021-02-16 DIAGNOSIS — R945 Abnormal results of liver function studies: Secondary | ICD-10-CM | POA: Diagnosis not present

## 2021-02-16 DIAGNOSIS — K7010 Alcoholic hepatitis without ascites: Secondary | ICD-10-CM | POA: Diagnosis present

## 2021-02-16 DIAGNOSIS — M6282 Rhabdomyolysis: Secondary | ICD-10-CM | POA: Diagnosis not present

## 2021-02-16 DIAGNOSIS — S50311A Abrasion of right elbow, initial encounter: Secondary | ICD-10-CM | POA: Diagnosis present

## 2021-02-16 DIAGNOSIS — G8929 Other chronic pain: Secondary | ICD-10-CM | POA: Diagnosis present

## 2021-02-16 DIAGNOSIS — R569 Unspecified convulsions: Secondary | ICD-10-CM

## 2021-02-16 DIAGNOSIS — R4182 Altered mental status, unspecified: Secondary | ICD-10-CM | POA: Diagnosis not present

## 2021-02-16 DIAGNOSIS — F339 Major depressive disorder, recurrent, unspecified: Secondary | ICD-10-CM | POA: Diagnosis present

## 2021-02-16 DIAGNOSIS — Z880 Allergy status to penicillin: Secondary | ICD-10-CM

## 2021-02-16 DIAGNOSIS — S4991XA Unspecified injury of right shoulder and upper arm, initial encounter: Secondary | ICD-10-CM | POA: Diagnosis not present

## 2021-02-16 DIAGNOSIS — G894 Chronic pain syndrome: Secondary | ICD-10-CM | POA: Diagnosis not present

## 2021-02-16 DIAGNOSIS — N179 Acute kidney failure, unspecified: Principal | ICD-10-CM | POA: Diagnosis present

## 2021-02-16 DIAGNOSIS — E876 Hypokalemia: Secondary | ICD-10-CM | POA: Diagnosis not present

## 2021-02-16 DIAGNOSIS — F419 Anxiety disorder, unspecified: Secondary | ICD-10-CM | POA: Diagnosis present

## 2021-02-16 DIAGNOSIS — Z853 Personal history of malignant neoplasm of breast: Secondary | ICD-10-CM

## 2021-02-16 DIAGNOSIS — F1020 Alcohol dependence, uncomplicated: Secondary | ICD-10-CM | POA: Diagnosis not present

## 2021-02-16 DIAGNOSIS — R7989 Other specified abnormal findings of blood chemistry: Secondary | ICD-10-CM | POA: Diagnosis not present

## 2021-02-16 DIAGNOSIS — Z20822 Contact with and (suspected) exposure to covid-19: Secondary | ICD-10-CM | POA: Diagnosis not present

## 2021-02-16 DIAGNOSIS — I10 Essential (primary) hypertension: Secondary | ICD-10-CM | POA: Diagnosis present

## 2021-02-16 DIAGNOSIS — R269 Unspecified abnormalities of gait and mobility: Secondary | ICD-10-CM

## 2021-02-16 DIAGNOSIS — F22 Delusional disorders: Secondary | ICD-10-CM | POA: Diagnosis not present

## 2021-02-16 DIAGNOSIS — X58XXXA Exposure to other specified factors, initial encounter: Secondary | ICD-10-CM | POA: Diagnosis present

## 2021-02-16 DIAGNOSIS — M25512 Pain in left shoulder: Secondary | ICD-10-CM | POA: Diagnosis present

## 2021-02-16 DIAGNOSIS — E878 Other disorders of electrolyte and fluid balance, not elsewhere classified: Secondary | ICD-10-CM | POA: Diagnosis present

## 2021-02-16 DIAGNOSIS — R937 Abnormal findings on diagnostic imaging of other parts of musculoskeletal system: Secondary | ICD-10-CM | POA: Diagnosis present

## 2021-02-16 DIAGNOSIS — M4802 Spinal stenosis, cervical region: Secondary | ICD-10-CM | POA: Diagnosis not present

## 2021-02-16 DIAGNOSIS — E860 Dehydration: Secondary | ICD-10-CM | POA: Diagnosis present

## 2021-02-16 DIAGNOSIS — Z596 Low income: Secondary | ICD-10-CM

## 2021-02-16 DIAGNOSIS — Z923 Personal history of irradiation: Secondary | ICD-10-CM

## 2021-02-16 DIAGNOSIS — F5104 Psychophysiologic insomnia: Secondary | ICD-10-CM | POA: Diagnosis not present

## 2021-02-16 DIAGNOSIS — R030 Elevated blood-pressure reading, without diagnosis of hypertension: Secondary | ICD-10-CM | POA: Diagnosis present

## 2021-02-16 DIAGNOSIS — Z888 Allergy status to other drugs, medicaments and biological substances status: Secondary | ICD-10-CM

## 2021-02-16 DIAGNOSIS — R410 Disorientation, unspecified: Secondary | ICD-10-CM

## 2021-02-16 DIAGNOSIS — Z8249 Family history of ischemic heart disease and other diseases of the circulatory system: Secondary | ICD-10-CM

## 2021-02-16 DIAGNOSIS — Z79891 Long term (current) use of opiate analgesic: Secondary | ICD-10-CM

## 2021-02-16 DIAGNOSIS — D3502 Benign neoplasm of left adrenal gland: Secondary | ICD-10-CM | POA: Diagnosis not present

## 2021-02-16 DIAGNOSIS — J439 Emphysema, unspecified: Secondary | ICD-10-CM | POA: Diagnosis not present

## 2021-02-16 DIAGNOSIS — G629 Polyneuropathy, unspecified: Secondary | ICD-10-CM | POA: Diagnosis present

## 2021-02-16 DIAGNOSIS — F3342 Major depressive disorder, recurrent, in full remission: Secondary | ICD-10-CM | POA: Diagnosis present

## 2021-02-16 DIAGNOSIS — F17210 Nicotine dependence, cigarettes, uncomplicated: Secondary | ICD-10-CM | POA: Diagnosis present

## 2021-02-16 DIAGNOSIS — K828 Other specified diseases of gallbladder: Secondary | ICD-10-CM | POA: Diagnosis not present

## 2021-02-16 DIAGNOSIS — F1010 Alcohol abuse, uncomplicated: Secondary | ICD-10-CM | POA: Diagnosis not present

## 2021-02-16 DIAGNOSIS — Z9181 History of falling: Secondary | ICD-10-CM

## 2021-02-16 DIAGNOSIS — S5001XA Contusion of right elbow, initial encounter: Secondary | ICD-10-CM | POA: Diagnosis present

## 2021-02-16 DIAGNOSIS — Z882 Allergy status to sulfonamides status: Secondary | ICD-10-CM

## 2021-02-16 DIAGNOSIS — F32A Depression, unspecified: Secondary | ICD-10-CM | POA: Diagnosis present

## 2021-02-16 DIAGNOSIS — T796XXA Traumatic ischemia of muscle, initial encounter: Secondary | ICD-10-CM | POA: Diagnosis present

## 2021-02-16 DIAGNOSIS — M47812 Spondylosis without myelopathy or radiculopathy, cervical region: Secondary | ICD-10-CM | POA: Diagnosis not present

## 2021-02-16 DIAGNOSIS — E871 Hypo-osmolality and hyponatremia: Secondary | ICD-10-CM | POA: Diagnosis not present

## 2021-02-16 DIAGNOSIS — I6529 Occlusion and stenosis of unspecified carotid artery: Secondary | ICD-10-CM | POA: Diagnosis not present

## 2021-02-16 DIAGNOSIS — K862 Cyst of pancreas: Secondary | ICD-10-CM | POA: Diagnosis not present

## 2021-02-16 DIAGNOSIS — S0990XA Unspecified injury of head, initial encounter: Secondary | ICD-10-CM | POA: Diagnosis not present

## 2021-02-16 DIAGNOSIS — R5383 Other fatigue: Secondary | ICD-10-CM | POA: Diagnosis present

## 2021-02-16 DIAGNOSIS — R296 Repeated falls: Secondary | ICD-10-CM

## 2021-02-16 DIAGNOSIS — K838 Other specified diseases of biliary tract: Secondary | ICD-10-CM | POA: Diagnosis not present

## 2021-02-16 LAB — COMPREHENSIVE METABOLIC PANEL
ALT: 204 U/L — ABNORMAL HIGH (ref 0–44)
AST: 415 U/L — ABNORMAL HIGH (ref 15–41)
Albumin: 4.1 g/dL (ref 3.5–5.0)
Alkaline Phosphatase: 111 U/L (ref 38–126)
Anion gap: 15 (ref 5–15)
BUN: 70 mg/dL — ABNORMAL HIGH (ref 8–23)
CO2: 22 mmol/L (ref 22–32)
Calcium: 9.1 mg/dL (ref 8.9–10.3)
Chloride: 97 mmol/L — ABNORMAL LOW (ref 98–111)
Creatinine, Ser: 1.34 mg/dL — ABNORMAL HIGH (ref 0.44–1.00)
GFR, Estimated: 44 mL/min — ABNORMAL LOW (ref >60)
Glucose, Bld: 122 mg/dL — ABNORMAL HIGH (ref 70–99)
Potassium: 4.9 mmol/L (ref 3.5–5.1)
Sodium: 134 mmol/L — ABNORMAL LOW (ref 135–145)
Total Bilirubin: 2 mg/dL — ABNORMAL HIGH (ref 0.3–1.2)
Total Protein: 7.7 g/dL (ref 6.5–8.1)

## 2021-02-16 LAB — CBC
HCT: 36 % (ref 36.0–46.0)
Hemoglobin: 11.9 g/dL — ABNORMAL LOW (ref 12.0–15.0)
MCH: 30.7 pg (ref 26.0–34.0)
MCHC: 33.1 g/dL (ref 30.0–36.0)
MCV: 93 fL (ref 80.0–100.0)
Platelets: 246 10*3/uL (ref 150–400)
RBC: 3.87 MIL/uL (ref 3.87–5.11)
RDW: 14.6 % (ref 11.5–15.5)
WBC: 8.2 10*3/uL (ref 4.0–10.5)
nRBC: 0.2 % (ref 0.0–0.2)

## 2021-02-16 LAB — SALICYLATE LEVEL: Salicylate Lvl: <7 mg/dL — ABNORMAL LOW (ref 7.0–30.0)

## 2021-02-16 LAB — RESP PANEL BY RT-PCR (FLU A&B, COVID) ARPGX2
Influenza A by PCR: NEGATIVE
Influenza B by PCR: NEGATIVE
SARS Coronavirus 2 by RT PCR: NEGATIVE

## 2021-02-16 LAB — ETHANOL: Alcohol, Ethyl (B): <10 mg/dL (ref <10)

## 2021-02-16 LAB — ACETAMINOPHEN LEVEL: Acetaminophen (Tylenol), Serum: <10 ug/mL — ABNORMAL LOW (ref 10–30)

## 2021-02-16 MED ORDER — SODIUM CHLORIDE 0.9 % IV BOLUS
1000.0000 mL | Freq: Once | INTRAVENOUS | Status: AC
  Administered 2021-02-17: 1000 mL via INTRAVENOUS

## 2021-02-16 NOTE — ED Triage Notes (Signed)
Pt presents to ER with [employer] under IVC papers.  Per IVC papers, wellness check was done on pt today and police found pt locked in a room with 3 dogs who had defecated and urinated on floor in room.  Pt reportedly has a spinal issue and is prescribed morphine which she has been taking and drinking alcohol with.  Pt states "other people locked me in the room." Pt states she has been hearing voices.  Pt denies SI/HI at this time.

## 2021-02-16 NOTE — ED Notes (Signed)
IVC/Pending placement 

## 2021-02-16 NOTE — ED Notes (Addendum)
Pt belongings   Pearline Cables sweater  [place] t shirt  Black pants  [employer]  White socks  Blue underwear Yellow colored ring 2 yellow colored bracelets  One multi-colored bracelets -- unable to take off bracelet.  Not cut off d/t sentimental value  yellow colored earrings  2 Yellow colored necklace  One pair rx eyeglasses  Black cell phone  Pack of cigarettes  Lighter

## 2021-02-16 NOTE — Progress Notes (Signed)
[facility])                                            [place] Team    02/16/2021  [place] Cross [birth year] [id number]  Care coordination call placed to BI in regard to [facility] application.  Spoke to [place] who informs patient is APPROVED 02/05/21-02/04/22. She informs patient will need to call in for a refill about 2 weeks before running out of medication just as she did in 2022.  [name], [place]  [phone]

## 2021-02-16 NOTE — ED Provider Notes (Signed)
[facility] Provider Note    Event Date/Time   First MD Initiated Contact with Patient 02/16/21 2213     (approximate)  History   Chief Complaint: Psychiatric Evaluation  HPI  [facility] Cross is a 66 y.o. female with a past medical history of anxiety, COPD, alcohol abuse, chronic pain, presents to the emergency department for psychiatric evaluation.  Per IVC patient had not been heard from by family in approximately 3 days.  Police went to do a welfare check and found the patient locked in the bedroom with several dogs and has been urinating and defecating in the room.  Patient states she has been drinking as well.  Per IVC report patient takes morphine for chronic pain but has been taking this medication and drinking heavy quantities of alcohol.  When asked why the patient was locked in the room she told me that she did not know how to get out of the room.  Per IVC she had told police that she was kidnapped and placed in the room and locked in the room.  Patient's only medical complaint is pain in her right arm where she states she fell.  Good range of motion in the right arm.  Does have a scab to her right elbow but appears several days old at least.  Physical Exam   Triage Vital Signs: ED Triage Vitals [02/16/21 2057]  Enc Vitals Group     BP (!) 153/141     Pulse Rate 90     Resp 18     Temp 97.9 F (36.6 C)     Temp Source Oral     SpO2 95 %     Weight      Height      Head Circumference      Peak Flow      Pain Score      Pain Loc      Pain Edu?      Excl. in [place]?     Most recent vital signs: Vitals:   02/16/21 2057 02/16/21 2213  BP: (!) 153/141 120/61  Pulse: 90 100  Resp: 18 18  Temp: 97.9 F (36.6 C)   SpO2: 95% 100%    General: Awake, no distress.  Slightly slurred speech. CV:  Good peripheral perfusion.  Regular rate and rhythm around 100 bpm. Resp:  Normal effort.  Equal breath sounds bilaterally.  Abd:  No distention.   Soft, nontender.  No rebound or guarding. Other:  Patient does have pain she states in the right humerus, great range of motion of the right shoulder and elbow.  Neuro vastly intact distally.  Patient does have scabbing to her right elbow that appears at least several days old.   ED Results / Procedures / Treatments    RADIOLOGY  CT scan of the head is pending. X-ray of the right humerus pending. Ultrasound of the right upper quadrant pending.   MEDICATIONS ORDERED IN ED: Medications - No data to display   IMPRESSION / MDM / [place] / ED COURSE  I reviewed the triage vital signs and the nursing notes.  Patient presents emergency department for psychiatric evaluation found by police after a wellness check to be locked in a room with several animals that have been urinating and defecating in the room.  Patient does admit to alcohol use she is not sure when she last drink as she did not know that she was in the room for 3  days.  Given the patient's pain to the right humerus we will obtain an x-ray as a precaution although good range of motion no high suspicion for fracture.  Patient's lab work has resulted showing a fairly normal CBC, negative alcohol level, but I chemistry showing creatinine 1.3 elevated LFTs likely due to alcohol use however increased from historical values.  We will obtain a right upper quadrant ultrasound as a precaution.  We will obtain x-ray of the right humerus.  Given the acute renal dysfunction we will also IV hydrate while awaiting further results.  We will have psychiatry evaluate the patient.  We will maintain the IVC until psychiatry can adequately evaluate.  Given the patient's recent falls will also obtain a CT scan of the head.  Imaging is pending.  CK is pending.  Patient receiving IV fluids.  Patient care signed out to oncoming provider.  FINAL CLINICAL IMPRESSION(S) / ED DIAGNOSES   Dehydration Falls Alcohol abuse    Note:  This document  was prepared using Dragon voice recognition software and may include unintentional dictation errors.   Harvest Dark, MD 02/16/21 2255

## 2021-02-16 NOTE — ED Notes (Signed)
Pt to CT and Xray with CT Tech, Security, and [place], [employer]

## 2021-02-17 ENCOUNTER — Emergency Department: Payer: PPO

## 2021-02-17 DIAGNOSIS — M25512 Pain in left shoulder: Secondary | ICD-10-CM | POA: Diagnosis present

## 2021-02-17 DIAGNOSIS — F22 Delusional disorders: Secondary | ICD-10-CM | POA: Diagnosis present

## 2021-02-17 DIAGNOSIS — R4182 Altered mental status, unspecified: Secondary | ICD-10-CM | POA: Diagnosis present

## 2021-02-17 DIAGNOSIS — K862 Cyst of pancreas: Secondary | ICD-10-CM | POA: Diagnosis not present

## 2021-02-17 DIAGNOSIS — E871 Hypo-osmolality and hyponatremia: Secondary | ICD-10-CM | POA: Diagnosis present

## 2021-02-17 DIAGNOSIS — N179 Acute kidney failure, unspecified: Secondary | ICD-10-CM | POA: Diagnosis present

## 2021-02-17 DIAGNOSIS — R7989 Other specified abnormal findings of blood chemistry: Secondary | ICD-10-CM

## 2021-02-17 DIAGNOSIS — Z20822 Contact with and (suspected) exposure to covid-19: Secondary | ICD-10-CM | POA: Diagnosis present

## 2021-02-17 DIAGNOSIS — M6282 Rhabdomyolysis: Secondary | ICD-10-CM | POA: Diagnosis not present

## 2021-02-17 DIAGNOSIS — T796XXA Traumatic ischemia of muscle, initial encounter: Secondary | ICD-10-CM

## 2021-02-17 DIAGNOSIS — X58XXXA Exposure to other specified factors, initial encounter: Secondary | ICD-10-CM | POA: Diagnosis present

## 2021-02-17 DIAGNOSIS — E876 Hypokalemia: Secondary | ICD-10-CM | POA: Diagnosis present

## 2021-02-17 DIAGNOSIS — I10 Essential (primary) hypertension: Secondary | ICD-10-CM | POA: Diagnosis present

## 2021-02-17 DIAGNOSIS — J439 Emphysema, unspecified: Secondary | ICD-10-CM | POA: Diagnosis present

## 2021-02-17 DIAGNOSIS — E860 Dehydration: Secondary | ICD-10-CM | POA: Diagnosis present

## 2021-02-17 DIAGNOSIS — G629 Polyneuropathy, unspecified: Secondary | ICD-10-CM | POA: Diagnosis present

## 2021-02-17 DIAGNOSIS — D3502 Benign neoplasm of left adrenal gland: Secondary | ICD-10-CM | POA: Diagnosis not present

## 2021-02-17 DIAGNOSIS — E878 Other disorders of electrolyte and fluid balance, not elsewhere classified: Secondary | ICD-10-CM | POA: Diagnosis present

## 2021-02-17 DIAGNOSIS — G894 Chronic pain syndrome: Secondary | ICD-10-CM | POA: Diagnosis present

## 2021-02-17 DIAGNOSIS — F3342 Major depressive disorder, recurrent, in full remission: Secondary | ICD-10-CM | POA: Diagnosis present

## 2021-02-17 DIAGNOSIS — W19XXXA Unspecified fall, initial encounter: Secondary | ICD-10-CM

## 2021-02-17 DIAGNOSIS — M4802 Spinal stenosis, cervical region: Secondary | ICD-10-CM | POA: Diagnosis present

## 2021-02-17 DIAGNOSIS — K838 Other specified diseases of biliary tract: Secondary | ICD-10-CM | POA: Diagnosis not present

## 2021-02-17 DIAGNOSIS — F17210 Nicotine dependence, cigarettes, uncomplicated: Secondary | ICD-10-CM | POA: Diagnosis present

## 2021-02-17 DIAGNOSIS — F1010 Alcohol abuse, uncomplicated: Secondary | ICD-10-CM

## 2021-02-17 DIAGNOSIS — G9341 Metabolic encephalopathy: Secondary | ICD-10-CM | POA: Diagnosis present

## 2021-02-17 DIAGNOSIS — K7010 Alcoholic hepatitis without ascites: Secondary | ICD-10-CM | POA: Diagnosis present

## 2021-02-17 DIAGNOSIS — F5104 Psychophysiologic insomnia: Secondary | ICD-10-CM | POA: Diagnosis present

## 2021-02-17 DIAGNOSIS — M47812 Spondylosis without myelopathy or radiculopathy, cervical region: Secondary | ICD-10-CM | POA: Diagnosis present

## 2021-02-17 DIAGNOSIS — M549 Dorsalgia, unspecified: Secondary | ICD-10-CM | POA: Diagnosis present

## 2021-02-17 DIAGNOSIS — F1020 Alcohol dependence, uncomplicated: Secondary | ICD-10-CM | POA: Diagnosis present

## 2021-02-17 DIAGNOSIS — K828 Other specified diseases of gallbladder: Secondary | ICD-10-CM | POA: Diagnosis not present

## 2021-02-17 DIAGNOSIS — F419 Anxiety disorder, unspecified: Secondary | ICD-10-CM | POA: Diagnosis present

## 2021-02-17 LAB — HIV ANTIBODY (ROUTINE TESTING W REFLEX): HIV Screen 4th Generation wRfx: NONREACTIVE

## 2021-02-17 LAB — URINALYSIS, COMPLETE (UACMP) WITH MICROSCOPIC
Bilirubin Urine: NEGATIVE
Glucose, UA: NEGATIVE mg/dL
Ketones, ur: 20 mg/dL — AB
Leukocytes,Ua: NEGATIVE
Nitrite: NEGATIVE
Protein, ur: 30 mg/dL — AB
Specific Gravity, Urine: 1.015 (ref 1.005–1.030)
pH: 5 (ref 5.0–8.0)

## 2021-02-17 LAB — HEPATITIS PANEL, ACUTE
HCV Ab: NONREACTIVE
Hep A IgM: NONREACTIVE
Hep B C IgM: NONREACTIVE
Hepatitis B Surface Ag: NONREACTIVE

## 2021-02-17 LAB — CK
Total CK: 10744 U/L — ABNORMAL HIGH (ref 38–234)
Total CK: 5705 U/L — ABNORMAL HIGH (ref 38–234)

## 2021-02-17 LAB — URINE DRUG SCREEN, QUALITATIVE (ARMC ONLY)
Amphetamines, Ur Screen: NOT DETECTED
Barbiturates, Ur Screen: NOT DETECTED
Benzodiazepine, Ur Scrn: NOT DETECTED
Cannabinoid 50 Ng, Ur ~~LOC~~: NOT DETECTED
Cocaine Metabolite,Ur ~~LOC~~: NOT DETECTED
MDMA (Ecstasy)Ur Screen: NOT DETECTED
Methadone Scn, Ur: NOT DETECTED
Opiate, Ur Screen: POSITIVE — AB
Phencyclidine (PCP) Ur S: NOT DETECTED
Tricyclic, Ur Screen: POSITIVE — AB

## 2021-02-17 LAB — MAGNESIUM: Magnesium: 2.6 mg/dL — ABNORMAL HIGH (ref 1.7–2.4)

## 2021-02-17 LAB — AMMONIA: Ammonia: 32 umol/L (ref 9–35)

## 2021-02-17 LAB — TSH: TSH: 0.618 u[IU]/mL (ref 0.350–4.500)

## 2021-02-17 LAB — PHOSPHORUS: Phosphorus: 1.4 mg/dL — ABNORMAL LOW (ref 2.5–4.6)

## 2021-02-17 MED ORDER — ADULT MULTIVITAMIN W/MINERALS CH
1.0000 | ORAL_TABLET | Freq: Every day | ORAL | Status: DC
  Administered 2021-02-17 – 2021-02-18 (×2): 1 via ORAL
  Filled 2021-02-17 (×2): qty 1

## 2021-02-17 MED ORDER — GABAPENTIN 600 MG PO TABS
1200.0000 mg | ORAL_TABLET | Freq: Three times a day (TID) | ORAL | Status: DC
  Administered 2021-02-17 – 2021-02-18 (×4): 1200 mg via ORAL
  Filled 2021-02-17 (×4): qty 2

## 2021-02-17 MED ORDER — IPRATROPIUM BROMIDE 0.06 % NA SOLN
2.0000 | Freq: Three times a day (TID) | NASAL | Status: DC
  Administered 2021-02-17 – 2021-02-18 (×3): 2 via NASAL
  Filled 2021-02-17: qty 15

## 2021-02-17 MED ORDER — LETROZOLE 2.5 MG PO TABS
2.5000 mg | ORAL_TABLET | Freq: Every day | ORAL | Status: DC
  Administered 2021-02-18: 2.5 mg via ORAL
  Filled 2021-02-17 (×2): qty 1

## 2021-02-17 MED ORDER — THIAMINE HCL 100 MG PO TABS
100.0000 mg | ORAL_TABLET | Freq: Every day | ORAL | Status: DC
  Administered 2021-02-17 – 2021-02-18 (×2): 100 mg via ORAL
  Filled 2021-02-17 (×2): qty 1

## 2021-02-17 MED ORDER — BUPROPION HCL ER (XL) 150 MG PO TB24
150.0000 mg | ORAL_TABLET | Freq: Every day | ORAL | Status: DC
  Administered 2021-02-18: 150 mg via ORAL
  Filled 2021-02-17: qty 1

## 2021-02-17 MED ORDER — BUPROPION HCL ER (XL) 150 MG PO TB24: 300.0000 mg | ORAL_TABLET | Freq: Every day | ORAL | Status: DC

## 2021-02-17 MED ORDER — ALBUTEROL SULFATE (2.5 MG/3ML) 0.083% IN NEBU: 3.0000 mL | INHALATION_SOLUTION | RESPIRATORY_TRACT | Status: DC | PRN

## 2021-02-17 MED ORDER — DULOXETINE HCL 60 MG PO CPEP
60.0000 mg | ORAL_CAPSULE | Freq: Two times a day (BID) | ORAL | Status: DC
  Administered 2021-02-17 (×2): 60 mg via ORAL
  Filled 2021-02-17 (×3): qty 1

## 2021-02-17 MED ORDER — SODIUM CHLORIDE 0.9 % IV BOLUS (SEPSIS)
1000.0000 mL | Freq: Once | INTRAVENOUS | Status: AC
  Administered 2021-02-17: 1000 mL via INTRAVENOUS

## 2021-02-17 MED ORDER — ENOXAPARIN SODIUM 40 MG/0.4ML IJ SOSY
40.0000 mg | PREFILLED_SYRINGE | INTRAMUSCULAR | Status: DC
  Administered 2021-02-18: 40 mg via SUBCUTANEOUS
  Filled 2021-02-17: qty 0.4

## 2021-02-17 MED ORDER — HYDROMORPHONE HCL 2 MG PO TABS
4.0000 mg | ORAL_TABLET | Freq: Two times a day (BID) | ORAL | Status: DC
  Administered 2021-02-17 – 2021-02-18 (×3): 4 mg via ORAL
  Filled 2021-02-17 (×3): qty 2

## 2021-02-17 MED ORDER — THIAMINE HCL 100 MG/ML IJ SOLN
100.0000 mg | Freq: Once | INTRAMUSCULAR | Status: AC
Start: 2021-02-17 — End: 2021-02-17
  Administered 2021-02-17: 100 mg via INTRAVENOUS
  Filled 2021-02-17: qty 2

## 2021-02-17 MED ORDER — ONDANSETRON HCL 4 MG PO TABS: 4.0000 mg | ORAL_TABLET | Freq: Four times a day (QID) | ORAL | Status: DC | PRN

## 2021-02-17 MED ORDER — TRAZODONE HCL 50 MG PO TABS: 25.0000 mg | ORAL_TABLET | Freq: Every evening | ORAL | Status: DC | PRN

## 2021-02-17 MED ORDER — ONDANSETRON HCL 4 MG/2ML IJ SOLN: 4.0000 mg | Freq: Four times a day (QID) | INTRAMUSCULAR | Status: DC | PRN

## 2021-02-17 MED ORDER — SENNOSIDES-DOCUSATE SODIUM 8.6-50 MG PO TABS: 1.0000 | ORAL_TABLET | Freq: Every evening | ORAL | Status: DC | PRN

## 2021-02-17 MED ORDER — THERA VITAL M PO TABS
1.0000 | ORAL_TABLET | Freq: Every day | ORAL | Status: DC
Start: 2021-02-17 — End: 2021-02-17

## 2021-02-17 MED ORDER — LAMOTRIGINE 25 MG PO TABS: 25.0000 mg | ORAL_TABLET | ORAL | Status: DC

## 2021-02-17 MED ORDER — ACETAMINOPHEN 325 MG PO TABS: 650.0000 mg | ORAL_TABLET | Freq: Four times a day (QID) | ORAL | Status: DC | PRN

## 2021-02-17 MED ORDER — LORAZEPAM 2 MG/ML IJ SOLN: 1.0000 mg | INTRAMUSCULAR | Status: DC | PRN

## 2021-02-17 MED ORDER — LORAZEPAM 2 MG/ML IJ SOLN: 1.0000 mg | Freq: Once | INTRAMUSCULAR | Status: DC | PRN

## 2021-02-17 MED ORDER — SODIUM CHLORIDE 0.9 % IV SOLN: INTRAVENOUS | Status: DC

## 2021-02-17 MED ORDER — ARFORMOTEROL TARTRATE 15 MCG/2ML IN NEBU
15.0000 ug | INHALATION_SOLUTION | Freq: Two times a day (BID) | RESPIRATORY_TRACT | Status: DC
  Administered 2021-02-17 – 2021-02-18 (×3): 15 ug via RESPIRATORY_TRACT
  Filled 2021-02-17 (×5): qty 2

## 2021-02-17 MED ORDER — METOPROLOL SUCCINATE ER 25 MG PO TB24
25.0000 mg | ORAL_TABLET | Freq: Every day | ORAL | Status: DC
  Administered 2021-02-17 – 2021-02-18 (×2): 25 mg via ORAL
  Filled 2021-02-17 (×2): qty 1

## 2021-02-17 MED ORDER — OXYMORPHONE HCL 10 MG PO TABS: 10.0000 mg | ORAL_TABLET | Freq: Two times a day (BID) | ORAL | Status: DC

## 2021-02-17 MED ORDER — LAMOTRIGINE 100 MG PO TABS: 100.0000 mg | ORAL_TABLET | Freq: Every day | ORAL | Status: DC

## 2021-02-17 MED ORDER — HYDROXYZINE PAMOATE 25 MG PO CAPS
25.0000 mg | ORAL_CAPSULE | Freq: Three times a day (TID) | ORAL | Status: DC | PRN
  Filled 2021-02-17: qty 1

## 2021-02-17 MED ORDER — MONTELUKAST SODIUM 10 MG PO TABS
10.0000 mg | ORAL_TABLET | Freq: Every day | ORAL | Status: DC
  Administered 2021-02-17: 10 mg via ORAL
  Filled 2021-02-17: qty 1

## 2021-02-17 MED ORDER — MAGNESIUM HYDROXIDE 400 MG/5ML PO SUSP: 30.0000 mL | Freq: Every day | ORAL | Status: DC | PRN

## 2021-02-17 MED ORDER — LAMOTRIGINE 25 MG PO TABS
200.0000 mg | ORAL_TABLET | Freq: Every day | ORAL | Status: DC
  Administered 2021-02-17 – 2021-02-18 (×2): 200 mg via ORAL
  Filled 2021-02-17: qty 2
  Filled 2021-02-17: qty 8

## 2021-02-17 MED ORDER — UMECLIDINIUM BROMIDE 62.5 MCG/ACT IN AEPB
1.0000 | INHALATION_SPRAY | Freq: Every day | RESPIRATORY_TRACT | Status: DC
  Administered 2021-02-18: 1 via RESPIRATORY_TRACT
  Filled 2021-02-17: qty 7

## 2021-02-17 MED ORDER — FLUTICASONE PROPIONATE 50 MCG/ACT NA SUSP
2.0000 | Freq: Every day | NASAL | Status: DC
  Administered 2021-02-18: 2 via NASAL
  Filled 2021-02-17: qty 16

## 2021-02-17 MED ORDER — ACETAMINOPHEN 650 MG RE SUPP: 650.0000 mg | Freq: Four times a day (QID) | RECTAL | Status: DC | PRN

## 2021-02-17 MED ORDER — FOLIC ACID 1 MG PO TABS
1.0000 mg | ORAL_TABLET | Freq: Every day | ORAL | Status: DC
  Administered 2021-02-17 – 2021-02-18 (×2): 1 mg via ORAL
  Filled 2021-02-17 (×2): qty 1

## 2021-02-17 MED ORDER — ENSURE ENLIVE PO LIQD
237.0000 mL | Freq: Two times a day (BID) | ORAL | Status: DC
  Administered 2021-02-17 – 2021-02-18 (×3): 237 mL via ORAL

## 2021-02-17 MED ORDER — CYCLOBENZAPRINE HCL 10 MG PO TABS
10.0000 mg | ORAL_TABLET | Freq: Three times a day (TID) | ORAL | Status: DC | PRN
  Administered 2021-02-17: 10 mg via ORAL
  Filled 2021-02-17: qty 1

## 2021-02-17 NOTE — ED Notes (Signed)
Pt transported to MRI at this time with MRI tech, security, and [place], [place]

## 2021-02-17 NOTE — Consult Note (Signed)
[place] Psychiatry Consult   Reason for Consult: Psychiatric Evaluation Referring Physician: Dr. Kerman Passey Patient Identification: Ashley Cross MRN:  [id number] Principal Diagnosis: Acute renal failure due to traumatic rhabdomyolysis [facility]) Diagnosis:  Principal Problem:   Acute renal failure due to traumatic rhabdomyolysis [facility]) Active Problems:   MDD (major depressive disorder), recurrent episode, with atypical features ([place])   Alcohol use   Chronic pain syndrome   Chronic insomnia   Fatigue   HTN (hypertension)   Anxiety and depression   Alcohol use disorder, mild, abuse   Chronic left shoulder pain   Elevated blood-pressure reading, without diagnosis of hypertension   Seizure-like activity (HCC)   Abnormal MRI, lumbar spine   Arthritis of neck   Abnormal gait   Frequent falls   Total Time spent with patient: 1 hour  Subjective: "I taught someone locked me up in my room with my dogs and cats." Ashley Cross is a 66 y.o. female patient presented to [facility] ED via law enforcement under involuntary commitment  status (IVC). Per IVC papers, wellness check was done on pt today and police found pt locked in a room with 3 dogs who had defecated and urinated on floor in room.  Pt reportedly has a spinal issue and is prescribed morphine which she has been taking and drinking alcohol with.  Pt states "other people locked me in the room." Pt states she has been hearing voices. The patient was seen face-to-face by this provider; the chart was reviewed and consulted with Dr. Leonides Schanz on 02/17/2021 due to the patient's care. It was discussed with both providers that the patient does meet the criteria to be admitted to the psychiatric inpatient unit.  On evaluation, the patient is alert and oriented x 2-3, anxious, emotional but cooperative, and mood-congruent with affect. The patient does not appear to be responding to internal or external stimuli. Neither is the patient  presenting with any delusional thinking. The patient currently denies auditory or visual hallucinations, but she admits at home, she was experiencing auditory hallucinations. The patient denies any suicidal, homicidal, or self-harm ideations. The patient is not presenting with any psychotic or paranoid behaviors.   HPI: Per Dr. Kerman Passey, Ashley Cross is a 66 y.o. female with a past medical history of anxiety, COPD, alcohol abuse, chronic pain, presents to the emergency department for psychiatric evaluation.  Per IVC patient had not been heard from by family in approximately 3 days.  Police went to do a welfare check and found the patient locked in the bedroom with several dogs and has been urinating and defecating in the room.  Patient states she has been drinking as well.  Per IVC report patient takes morphine for chronic pain but has been taking this medication and drinking heavy quantities of alcohol.  When asked why the patient was locked in the room she told me that she did not know how to get out of the room.  Per IVC she had told police that she was kidnapped and placed in the room and locked in the room.  Patient's only medical complaint is pain in her right arm where she states she fell.  Good range of motion in the right arm.  Does have a scab to her right elbow but appears several days old at least.  Past Psychiatric History:   Risk to Self:   Risk to Others:   Prior Inpatient Therapy:   Prior Outpatient Therapy:    Past Medical History:  Past Medical  History:  Diagnosis Date   Anxiety    Breast cancer ([place])    Left breast(nipple mass) 2018   Cervical stenosis of spine    Chicken pox    Chronic back pain    Colon polyps    COPD (chronic obstructive pulmonary disease) (HCC)    Cyst of left nipple    Depression    Hypertension    Insomnia    Personal history of radiation therapy     Past Surgical History:  Procedure Laterality Date   BACK SURGERY     x 3 (2010 x 2;  2013 x 1)  Dr. Ronnald Ramp   BREAST BIOPSY Left    core- neg   BREAST LUMPECTOMY Left 2018   BREAST SURGERY Left    breast biopsy   CERVICAL CONE BIOPSY     CERVICAL FUSION     times 2   COLONOSCOPY WITH PROPOFOL N/A 04/18/2015   Procedure: COLONOSCOPY WITH PROPOFOL;  Surgeon: [name], MD;  Location: [facility] ENDOSCOPY;  Service: Endoscopy;  Laterality: N/A;   ELBOW SURGERY     2014 b/l elbows per pt ulnar nerve    ELBOW SURGERY     x2    EPIDURAL BLOCK INJECTION     Dr. Maryjean Ka    MASS EXCISION Left 04/12/2016   Procedure: EXCISION OF LEFT NIPPLE MASS;  Surgeon: Stark Klein, MD;  Location: [place];  Service: General;  Laterality: Left;   NECK SURGERY     POSTERIOR CERVICAL FUSION/FORAMINOTOMY  03/07/2011   Procedure: POSTERIOR CERVICAL FUSION/FORAMINOTOMY LEVEL 4;  Surgeon: Eustace Moore, MD;  Location: [place] NEURO ORS;  Service: Neurosurgery;  Laterality: N/A;  cervical three to thoracic one posterior cervical fusion    SENTINEL NODE BIOPSY Left 05/29/2016   Procedure: LEFT SENTINEL LYMPH NODE BIOPSY;  Surgeon: Stark Klein, MD;  Location: [place];  Service: General;  Laterality: Left;   TONSILLECTOMY     Family History:  Family History  Problem Relation Age of Onset   Breast cancer Mother 64   Hypertension Mother    Cancer Mother        breast dx'ed 41    Cancer Father        prostate   Hypertension Father    Drug abuse Daughter    Hypertension Maternal Grandmother    Breast cancer Cousin    Family Psychiatric  History:  Social History:  Social History   Substance and Sexual Activity  Alcohol Use Yes   Comment: weekends and some today     Social History   Substance and Sexual Activity  Drug Use No   Comment: on opana since jan 2018    Social History   Socioeconomic History   Marital status: Divorced    Spouse name: Not on file   Number of children: 2   Years of education: Not on file   Highest education level: Not on file  Occupational  History   Not on file  Tobacco Use   Smoking status: Every Day    Packs/day: 0.25    Years: 30.00    Pack years: 7.50    Types: Cigarettes   Smokeless tobacco: Never   Tobacco comments:    work days - 6 cigarettes; weekends - ~10 - reported on 10/13/2020  Vaping Use   Vaping Use: Every day  Substance and Sexual Activity   Alcohol use: Yes    Comment: weekends and some today   Drug use: No  Comment: on opana since jan 2018   Sexual activity: Not on file  Other Topics Concern   Not on file  Social History Narrative   As of 01/25/17 not sexually active of in relationship    Divorced   2 kids son and daughter (though daughter died of suicide in April 28, 2017)   Son lives in [place] now as of 12/2018       Disability    Loves animals has 3 dogs           Social Determinants of Radio broadcast assistant Strain: Medium Risk   Difficulty of Paying Living Expenses: Somewhat hard  Food Insecurity: No Food Insecurity   Worried About Charity fundraiser in the Last Year: Never true   Arboriculturist in the Last Year: Never true  Transportation Needs: No Transportation Needs   Lack of Transportation (Medical): No   Lack of Transportation (Non-Medical): No  Physical Activity: Not on file  Stress: Stress Concern Present   Feeling of Stress : Rather much  Social Connections: Unknown   Frequency of Communication with Friends and Family: More than three times a week   Frequency of Social Gatherings with Friends and Family: Not on file   Attends Religious Services: Not on file   Active Member of Clubs or Organizations: Not on file   Attends Archivist Meetings: Not on file   Marital Status: Not on file   Additional Social History:    Allergies:   Allergies  Allergen Reactions   Anastrozole    Augmentin [Amoxicillin-Pot Clavulanate]     Diarrhea    Norvasc [Amlodipine]     Swelling    Topamax [Topiramate]     Didn't like the way made feel more irritatble/sad     Sulfa Antibiotics Itching and Rash   Sulfonamide Derivatives Itching and Rash    Labs:  Results for orders placed or performed during the hospital encounter of 02/16/21 (from the past 48 hour(s))  Comprehensive metabolic panel     Status: Abnormal   Collection Time: 02/16/21  9:00 PM  Result Value Ref Range   Sodium 134 (L) 135 - 145 mmol/L   Potassium 4.9 3.5 - 5.1 mmol/L   Chloride 97 (L) 98 - 111 mmol/L   CO2 22 22 - 32 mmol/L   Glucose, Bld 122 (H) 70 - 99 mg/dL    Comment: Glucose reference range applies only to samples taken after fasting for at least 8 hours.   BUN 70 (H) 8 - 23 mg/dL   Creatinine, Ser 1.34 (H) 0.44 - 1.00 mg/dL   Calcium 9.1 8.9 - 10.3 mg/dL   Total Protein 7.7 6.5 - 8.1 g/dL   Albumin 4.1 3.5 - 5.0 g/dL   AST 415 (H) 15 - 41 U/L   ALT 204 (H) 0 - 44 U/L   Alkaline Phosphatase 111 38 - 126 U/L   Total Bilirubin 2.0 (H) 0.3 - 1.2 mg/dL   GFR, Estimated 44 (L) >60 mL/min    Comment: (NOTE) Calculated using the CKD-EPI Creatinine Equation 04-29-19)    Anion gap 15 5 - 15    Comment: Performed at [facility], [address] [place], [place] [address]  Ethanol     Status: None   Collection Time: 02/16/21  9:00 PM  Result Value Ref Range   Alcohol, Ethyl (B) <10 <10 mg/dL    Comment: (NOTE) Lowest detectable limit for serum alcohol is 10 mg/dL.  For medical purposes only. Performed at [facility], [address] [place], [place] [address]   Salicylate level     Status: Abnormal   Collection Time: 02/16/21  9:00 PM  Result Value Ref Range   Salicylate Lvl <0.2 (L) 7.0 - 30.0 mg/dL    Comment: Performed at [facility], [address] [place], [place] [address]  Acetaminophen level     Status: Abnormal   Collection Time: 02/16/21  9:00 PM  Result Value Ref Range   Acetaminophen (Tylenol), Serum <10 (L) 10 - 30 ug/mL    Comment: (NOTE) Therapeutic concentrations vary significantly. A range of 10-30 ug/mL  may be an  effective concentration for many patients. However, some  are best treated at concentrations outside of this range. Acetaminophen concentrations >150 ug/mL at 4 hours after ingestion  and >50 ug/mL at 12 hours after ingestion are often associated with  toxic reactions.  Performed at [facility], [address] [place], [place] [address]   cbc     Status: Abnormal   Collection Time: 02/16/21  9:00 PM  Result Value Ref Range   WBC 8.2 4.0 - 10.5 K/uL   RBC 3.87 3.87 - 5.11 MIL/uL   Hemoglobin 11.9 (L) 12.0 - 15.0 g/dL   HCT 36.0 36.0 - 46.0 %   MCV 93.0 80.0 - 100.0 fL   MCH 30.7 26.0 - 34.0 pg   MCHC 33.1 30.0 - 36.0 g/dL   RDW 14.6 11.5 - 15.5 %   Platelets 246 150 - 400 K/uL   nRBC 0.2 0.0 - 0.2 %    Comment: Performed at [facility], [address] [place], [place] [address]  Urine Drug Screen, Qualitative     Status: Abnormal   Collection Time: 02/16/21  9:00 PM  Result Value Ref Range   Tricyclic, Ur Screen POSITIVE (A) NONE DETECTED   Amphetamines, Ur Screen NONE DETECTED NONE DETECTED   MDMA (Ecstasy)Ur Screen NONE DETECTED NONE DETECTED   Cocaine Metabolite,Ur [place] NONE DETECTED NONE DETECTED   Opiate, Ur Screen POSITIVE (A) NONE DETECTED   Phencyclidine (PCP) Ur S NONE DETECTED NONE DETECTED   Cannabinoid 50 Ng, Ur [place] NONE DETECTED NONE DETECTED   Barbiturates, Ur Screen NONE DETECTED NONE DETECTED   Benzodiazepine, Ur Scrn NONE DETECTED NONE DETECTED   Methadone Scn, Ur NONE DETECTED NONE DETECTED    Comment: (NOTE) Tricyclics + metabolites, urine    Cutoff 1000 ng/mL Amphetamines + metabolites, urine  Cutoff 1000 ng/mL MDMA (Ecstasy), urine              Cutoff 500 ng/mL Cocaine Metabolite, urine          Cutoff 300 ng/mL Opiate + metabolites, urine        Cutoff 300 ng/mL Phencyclidine (PCP), urine         Cutoff 25 ng/mL Cannabinoid, urine                 Cutoff 50 ng/mL Barbiturates + metabolites, urine  Cutoff 200 ng/mL Benzodiazepine,  urine              Cutoff 200 ng/mL Methadone, urine                   Cutoff 300 ng/mL  The urine drug screen provides only a preliminary, unconfirmed analytical test result and should not be used for non-medical purposes. Clinical consideration and professional judgment should be applied to any positive drug screen result due  to possible interfering substances. A more specific alternate chemical method must be used in order to obtain a confirmed analytical result. Gas chromatography / mass spectrometry (GC/MS) is the preferred confirm atory method. Performed at [facility], [address] [place], [place] [address]   CK     Status: Abnormal   Collection Time: 02/16/21  9:00 PM  Result Value Ref Range   Total CK 10,744 (H) 38 - 234 U/L    Comment: RESULT CONFIRMED BY MANUAL DILUTION RH Performed at [facility], [address] [place], [place] [address]   Urinalysis, Complete w Microscopic     Status: Abnormal   Collection Time: 02/16/21  9:00 PM  Result Value Ref Range   Color, Urine YELLOW (A) YELLOW   APPearance HAZY (A) CLEAR   Specific Gravity, Urine 1.015 1.005 - 1.030   pH 5.0 5.0 - 8.0   Glucose, UA NEGATIVE NEGATIVE mg/dL   Hgb urine dipstick MODERATE (A) NEGATIVE   Bilirubin Urine NEGATIVE NEGATIVE   Ketones, ur 20 (A) NEGATIVE mg/dL   Protein, ur 30 (A) NEGATIVE mg/dL   Nitrite NEGATIVE NEGATIVE   Leukocytes,Ua NEGATIVE NEGATIVE   RBC / HPF 0-5 0 - 5 RBC/hpf   WBC, UA 0-5 0 - 5 WBC/hpf   Bacteria, UA RARE (A) NONE SEEN   Squamous Epithelial / LPF 0-5 0 - 5   Mucus PRESENT    Budding Yeast PRESENT    Hyaline Casts, UA PRESENT     Comment: Performed at [facility], [address] [place], [place] [address]  TSH     Status: None   Collection Time: 02/16/21  9:00 PM  Result Value Ref Range   TSH 0.618 0.350 - 4.500 uIU/mL    Comment: Performed by a 3rd Generation assay with a functional sensitivity of <=0.01 uIU/mL. Performed  at [facility], [address] [place], [place] [address]   Resp Panel by RT-PCR (Flu A&B, Covid) Nasopharyngeal Swab     Status: None   Collection Time: 02/16/21  9:01 PM   Specimen: Nasopharyngeal Swab; Nasopharyngeal(NP) swabs in vial transport medium  Result Value Ref Range   SARS Coronavirus 2 by RT PCR NEGATIVE NEGATIVE    Comment: (NOTE) SARS-CoV-2 target nucleic acids are NOT DETECTED.  The SARS-CoV-2 RNA is generally detectable in upper respiratory specimens during the acute phase of infection. The lowest concentration of SARS-CoV-2 viral copies this assay can detect is 138 copies/mL. A negative result does not preclude SARS-Cov-2 infection and should not be used as the sole basis for treatment or other patient management decisions. A negative result may occur with  improper specimen collection/handling, submission of specimen other than nasopharyngeal swab, presence of viral mutation(s) within the areas targeted by this assay, and inadequate number of viral copies(<138 copies/mL). A negative result must be combined with clinical observations, patient history, and epidemiological information. The expected result is Negative.  Fact Sheet for Patients:  EntrepreneurPulse.com.au  Fact Sheet for Healthcare Providers:  IncredibleEmployment.be  This test is no t yet approved or cleared by the [place] FDA and  has been authorized for detection and/or diagnosis of SARS-CoV-2 by FDA under an Emergency Use Authorization (EUA). This EUA will remain  in effect (meaning this test can be used) for the duration of the COVID-19 declaration under Section 564(b)(1) of the Act, 21 U.S.C.section 360bbb-3(b)(1), unless the authorization is terminated  or revoked sooner.       Influenza A by PCR NEGATIVE  NEGATIVE   Influenza B by PCR NEGATIVE NEGATIVE    Comment: (NOTE) The Xpert Xpress SARS-CoV-2/FLU/RSV plus assay is intended as an  aid in the diagnosis of influenza from Nasopharyngeal swab specimens and should not be used as a sole basis for treatment. Nasal washings and aspirates are unacceptable for Xpert Xpress SARS-CoV-2/FLU/RSV testing.  Fact Sheet for Patients: EntrepreneurPulse.com.au  Fact Sheet for Healthcare Providers: IncredibleEmployment.be  This test is not yet approved or cleared by the [place] FDA and has been authorized for detection and/or diagnosis of SARS-CoV-2 by FDA under an Emergency Use Authorization (EUA). This EUA will remain in effect (meaning this test can be used) for the duration of the COVID-19 declaration under Section 564(b)(1) of the Act, 21 U.S.C. section 360bbb-3(b)(1), unless the authorization is terminated or revoked.  Performed at [facility], [address] [place], [place] [address]   Ammonia     Status: None   Collection Time: 02/17/21 12:14 AM  Result Value Ref Range   Ammonia 32 9 - 35 umol/L    Comment: Performed at [facility], [address] [place], [place] [address]    Current Facility-Administered Medications  Medication Dose Route Frequency Provider Last Rate Last Admin   0.9 %  sodium chloride infusion   Intravenous Continuous Ward, Delice Bison, DO 125 mL/hr at 02/17/21 0329 New Bag at 02/17/21 0329   LORazepam (ATIVAN) injection 1 mg  1 mg Intravenous Once PRN Ward, Delice Bison, DO       Current Outpatient Medications  Medication Sig Dispense Refill   albuterol (VENTOLIN HFA) 108 (90 Base) MCG/ACT inhaler Inhale 1-2 puffs into the lungs every 4 (four) hours as needed for wheezing or shortness of breath. 18 g 12   buPROPion (WELLBUTRIN XL) 300 MG 24 hr tablet TAKE 1 TABLET BY MOUTH ONCE DAILY *NOTE DOSEAGE CHANGE* **STOP THE 450MG  DAILY** 90 tablet 1   cyclobenzaprine (FLEXERIL) 10 MG tablet Take 10 mg by mouth every 8 (eight) hours as needed.     DULoxetine (CYMBALTA) 60 MG capsule Take 1  capsule (60 mg total) by mouth 2 (two) times daily. 60 capsule 1   fluticasone (FLONASE) 50 MCG/ACT nasal spray TAKE 2 SPRAYS INTO BOTH NOSTRILS DAILY 16 g 11   furosemide (LASIX) 40 MG tablet Take 1 tablet (40 mg total) by mouth daily. 90 tablet 3   gabapentin (NEURONTIN) 600 MG tablet Take 1,200 mg by mouth 3 (three) times daily.     hydrOXYzine (VISTARIL) 25 MG capsule TAKE ONE CAPSULE EVERY EIGHT HOURS AS NEEDED FOR ANXIETY 90 capsule 1   ipratropium (ATROVENT) 0.06 % nasal spray PLACE 2 SPRAYS INTO BOTH NOSTRILS 3 TIMES DAILY AS NEEDED 15 mL 12   lamoTRIgine (LAMICTAL) 100 MG tablet Take 1 tablet (100 mg total) by mouth daily. 180 tablet 0   lamoTRIgine (LAMICTAL) 200 MG tablet Take 1 tablet (200 mg total) by mouth daily. 60 tablet 5   lamoTRIgine (LAMICTAL) 25 MG tablet Titration: In addition to the 100mg  tabs BID. Take one 25mg  tablet BID x 1 week, then two 25mg  tabs BID x 1 week, then three 25mg  tabs BID x 1 week, then transition to lamotrigine 200mg , one tab BID (separate rx on file). 84 tablet 0   letrozole (FEMARA) 2.5 MG tablet Take 1 tablet (2.5 mg total) by mouth daily. 90 tablet 3   metoprolol succinate (TOPROL-XL) 25 MG 24 hr tablet Take 1 tablet (25 mg total) by mouth daily. 90 tablet 3  montelukast (SINGULAIR) 10 MG tablet Take 1 tablet (10 mg total) by mouth at bedtime. 90 tablet 3   Multiple Vitamins-Minerals (MULTIVITAMIN) tablet Take 1 tablet by mouth daily. 90 tablet 3   oxymorphone (OPANA) 10 MG tablet Take 10 mg by mouth 2 (two) times daily.     SENEXON-S 8.6-50 MG tablet TAKE 1-2 TABLETS BY MOUTH DAILY AS NEEDED FOR MILD CONSTIPATION. 180 tablet 1   telmisartan (MICARDIS) 80 MG tablet Take 1 tablet (80 mg total) by mouth daily. 90 tablet 3   Tiotropium Bromide-Olodaterol (STIOLTO RESPIMAT) 2.5-2.5 MCG/ACT AERS Inhale 2 puffs into the lungs daily. 4 g 11    Musculoskeletal: Strength & Muscle Tone: decreased Gait & Station: unsteady Patient leans: N/A  Psychiatric  Specialty Exam:  Presentation  General Appearance: Bizarre; Disheveled  Eye Contact:Fair  Speech:Garbled; Slurred  Speech Volume:Decreased  Handedness:Right   Mood and Affect  Mood:Anxious; Depressed  Affect:Blunt; Inappropriate; Depressed; Congruent   Thought Process  Thought Processes:Goal Directed  Descriptions of Associations:Circumstantial  Orientation:Partial  Thought Content:Rumination  History of Schizophrenia/Schizoaffective disorder:No data recorded Duration of Psychotic Symptoms:No data recorded Hallucinations:Hallucinations: None  Ideas of Reference:None  Suicidal Thoughts:Suicidal Thoughts: No  Homicidal Thoughts:Homicidal Thoughts: No   Sensorium  Memory:Immediate Fair; Recent Fair; Remote Poor  Judgment:Poor  Insight:Poor   Executive Functions  Concentration:Fair  Attention Span:Fair  [place]   Psychomotor Activity  Psychomotor Activity:Psychomotor Activity: Decreased   Assets  Assets:Communication Skills; Desire for Improvement; Resilience; Social Support   Sleep  Sleep:Sleep: Poor   Physical Exam: Physical Exam Vitals and nursing note reviewed.  Constitutional:      Appearance: Normal appearance. She is normal weight.  HENT:     Head: Normocephalic and atraumatic.     Nose: Nose normal.     Mouth/Throat:     Mouth: Mucous membranes are dry.  Cardiovascular:     Rate and Rhythm: Normal rate.     Pulses: Normal pulses.  Pulmonary:     Effort: Pulmonary effort is normal.  Musculoskeletal:        General: Normal range of motion.     Cervical back: Normal range of motion and neck supple.  Neurological:     Mental Status: She is alert.  Psychiatric:        Attention and Perception: She perceives auditory hallucinations.        Mood and Affect: Mood is anxious and depressed. Affect is blunt and tearful.        Speech: Speech is delayed.        Behavior: Behavior is  withdrawn.        Thought Content: Thought content is paranoid.        Cognition and Memory: Cognition and memory normal.        Judgment: Judgment is inappropriate.   Review of Systems  Psychiatric/Behavioral:  Positive for memory loss and substance abuse. The patient is nervous/anxious and has insomnia.   All other systems reviewed and are negative. Blood pressure 120/61, pulse 100, temperature 97.9 F (36.6 C), temperature source Oral, resp. rate 18, SpO2 100 %. There is no height or weight on file to calculate BMI.  Treatment Plan Summary: Plan Patient does meet criteria for geriatric-psychiatric inpatient admission  Disposition: Recommend psychiatric Inpatient admission when medically cleared. Supportive therapy provided about ongoing stressors.  Caroline Sauger, NP 02/17/2021 4:27 AM

## 2021-02-17 NOTE — ED Notes (Signed)
Pt urinated in [place], no sample was collected. Dr. Leonides Schanz states that pt may receive IV fluids and produce more urine before sample needed

## 2021-02-17 NOTE — Evaluation (Addendum)
Physical Therapy Evaluation Patient Details Name: Ashley Cross MRN: [id number] DOB: Nov 25, 1955 Today's Date: 02/17/2021  History of Present Illness  66 y.o. Caucasian female with medical history significant for anxiety, COPD, depression, hypertension and chronic back pain, who presented to emergency room with acute onset of altered mental status with confusion. History of alcohol abuse, on chronic opioids. Noted to have right elbow abrasion and contusion.   Clinical Impression  Pt received napping, awakens easily and is agreeable to PT. Pt lives alone in a single level home with 3 dogs and 3 STE. She does not use an AD at baseline. She is Independent with all mobility, ADLs and IADLs. 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 Fall frequently at home due to tripping ("I have a hard time picking up my feet.").  Pt required MIN A to sit up to EOB. STS and ambulation performed with CGA, light steadying provided multiple times. She had tendency to drift to the right as she was making wide left turns around corners in the hallways. She also required steadying upon initial standing and sitting up to EOB. Weakness noted in bilateral ankles, presume from neuropathy. Pt states this weakness is baseline. PT would recommend OPPT to address balance deficits and frequent falling. Would benefit from skilled PT to address above deficits and promote optimal return to PLOF.      Recommendations for follow up therapy are one component of a multi-disciplinary discharge planning process, led by the attending physician.  Recommendations may be updated based on patient status, additional functional criteria and insurance authorization.  Follow Up Recommendations Outpatient PT    Assistance Recommended at Discharge PRN  Patient can return home with the following       Equipment Recommendations None recommended by PT  Recommendations for Other Services       Functional Status Assessment Patient  has had a recent decline in their functional status and demonstrates the ability to make significant improvements in function in a reasonable and predictable amount of time.     Precautions / Restrictions Precautions Precautions: Fall Restrictions Weight Bearing Restrictions: No      Mobility  Bed Mobility Overal bed mobility: Needs Assistance Bed Mobility: Supine to Sit     Supine to sit: Min assist     General bed mobility comments: HHA to pull trunk to upright    Transfers Overall transfer level: Needs assistance Equipment used: None Transfers: Sit to/from Stand Sit to Stand: Min guard           General transfer comment: From psych bed in ER. Initial steadying provided.    Ambulation/Gait Ambulation/Gait assistance: Min guard Gait Distance (Feet): 600 Feet Assistive device: None Gait Pattern/deviations: Step-through pattern;Wide base of support;Drifts right/left       General Gait Details: Good ambulation endurance with improved balance as ambulation progressed. Pt did drift while making wide turns around corners (90*). Pt in socks, states she walks better with shoes on. Occasional steadying required.  Stairs            Wheelchair Mobility    Modified Rankin (Stroke Patients Only)       Balance Overall balance assessment: Needs assistance Sitting-balance support: No upper extremity supported;Feet supported Sitting balance-Leahy Scale: Fair Sitting balance - Comments: Difficulty obtaining balance initially upon sitting up. Pt sitting EOB eating meal at end of session, balancing meal on lap.   Standing balance support: No upper extremity supported;During functional activity Standing balance-Leahy Scale: Fair Standing balance comment:  No LOB however light steadying was required. CGA at all times during ambulation for safety.                             Pertinent Vitals/Pain Pain Assessment: No/denies pain    Home Living  Family/patient expects to be discharged to:: Private residence Living Arrangements: Alone Available Help at Discharge: Friend(s);Available PRN/intermittently Type of Home: House Home Access: Stairs to enter Entrance Stairs-Rails: None Entrance Stairs-Number of Steps: 3   Home Layout: One level Home Equipment: Cane - single point Additional Comments: does not use AD at baseline    Prior Function Prior Level of Function : Independent/Modified Independent;Driving;History of Falls (last six months);Working/employed             Mobility Comments: Patient drives; she is employed as a Scientist, forensic. Reports 6+ falls in the last 6 months due to difficulty picking up her feet. ADLs Comments: Independent with ADLs and IADLs.     Hand Dominance        Extremity/Trunk Assessment   Upper Extremity Assessment Upper Extremity Assessment: Overall WFL for tasks assessed    Lower Extremity Assessment Lower Extremity Assessment: Overall WFL for tasks assessed;Generalized weakness (Weak bilateral ankle DF - neuropathy.)       Communication   Communication: No difficulties  Cognition Arousal/Alertness: Awake/alert Behavior During Therapy: WFL for tasks assessed/performed Overall Cognitive Status: Within Functional Limits for tasks assessed                                 General Comments: Pleasant and oriented.        General Comments      Exercises     Assessment/Plan    PT Assessment Patient needs continued PT services  PT Problem List Decreased strength;Decreased safety awareness;Decreased balance       PT Treatment Interventions Therapeutic exercise;Gait training;Balance training    PT Goals (Current goals can be found in the Care Plan section)  Acute Rehab PT Goals Patient Stated Goal: to decrease falls PT Goal Formulation: With patient Time For Goal Achievement: 03/03/21 Potential to Achieve Goals: Fair    Frequency Min 2X/week      Co-evaluation               AM-PAC PT "6 Clicks" Mobility  Outcome Measure Help needed turning from your back to your side while in a flat bed without using bedrails?: None Help needed moving from lying on your back to sitting on the side of a flat bed without using bedrails?: A Little Help needed moving to and from a bed to a chair (including a wheelchair)?: A Little Help needed standing up from a chair using your arms (e.g., wheelchair or bedside chair)?: A Little Help needed to walk in hospital room?: A Little Help needed climbing 3-5 steps with a railing? : A Little 6 Click Score: 19    End of Session Equipment Utilized During Treatment: Gait belt Activity Tolerance: Patient tolerated treatment well Patient left: in bed;with call bell/phone within reach Nurse Communication: Mobility status PT Visit Diagnosis: Unsteadiness on feet (R26.81);Muscle weakness (generalized) (M62.81);Difficulty in walking, not elsewhere classified (R26.2)    Time: 7782-4235 PT Time Calculation (min) (ACUTE ONLY): 17 min   Charges:   PT Evaluation $PT Eval Moderate Complexity: 1 Mod PT Treatments $Therapeutic Exercise: 8-22 mins        Patrina Levering  PT, DPT 02/17/21 4:27 PM [phone]

## 2021-02-17 NOTE — H&P (Signed)
[place]   PATIENT NAME: Ashley Cross    MR#:  [id number]  DATE OF BIRTH:  Jun 16, 1955  DATE OF ADMISSION:  02/16/2021  PRIMARY CARE PHYSICIAN: McLean-Scocuzza, Nino Glow, MD   Patient is coming from: Home  REQUESTING/REFERRING PHYSICIAN:Ward , Cyril Mourning, DO  CHIEF COMPLAINT:   Chief Complaint  Patient presents with   Psychiatric Evaluation    HISTORY OF PRESENT ILLNESS:  Ashley Cross is a 66 y.o. Caucasian female with medical history significant for anxiety, COPD, depression, hypertension and chronic back pain, who presented to emergency room with acute onset of altered mental status with confusion.  She was last seen normal on Thursday.  She is on Opana for chronic pain.  She has history of alcohol abuse and most likely has been drinking alcohol last night.  The patient was found by the police went to do a welfare check in her bed locked rooms with several dogs with feces and urine as she has been urinating and defecating in the room.  She has obviously been drinking alcohol as well.  She was IVC'ed. she told the police that she was kidnapped and placed in the room and locked in there.  She was noted to have right elbow abrasion and contusion.  She denies any chest pain or dyspnea or cough or wheezing.  No fever or chills.  She denies any dysuria, urinary frequency or urgency or flank pain.  ED Course: When she came to the ER, blood pressure was 153/141 and later on BP was within normal.  Vital signs were otherwise within normal.  Labs revealed mild hyponatremia and hypochloremia, BUN of 70, creatinine of 1.34 compared to previous normal levels on 08/05/2020.  AST was 415 and ALT 204 from previous normal levels in July of 2022 with total bili of 2.  CK was 10,744. CBC showed hemoglobin of 11.9 hematocrit 36.  Influenza antigens and COVID-19 PCR came back negative EKG as reviewed by me : Pending Imaging: Right humerus x-ray came back negative for fracture.  2 view chest ray  showed no acute Cardiopulmonary disease. Abdominal ultrasound was negative for gallstones and showed mild intrahepatic biliary dilatation with dilated common bile duct of 11 mm similar to CT from 08/26/2020.  MRCP revealed the following: 1. There is some very mild central intrahepatic biliary ductal dilatation and mild dilatation of the common bile duct. This is similar to prior CT of the abdomen 08/26/2020. No obstructing choledocholithiasis or definite pancreatic head mass identified on today's noncontrast examination. This may simply be a benign finding in this patient. However, if there is strong clinical concern and laboratory evidence of biliary tract obstruction, further evaluation with repeat abdominal MRI with and without IV gadolinium should be considered to exclude the possibility of a subtle ampullary lesion. 2. Small left adrenal adenoma. 3. 5 mm cystic lesion in the pancreatic body, likely tiny pancreatic pseudocysts, area of side branch ductal ectasia, or less likely a small side branch IPMN (intraductal papillary mucinous neoplasm). Recommend follow up pre and post contrast MRI/MRCP or pancreatic protocol CT in 2 years.  The patient was given 2 L bolus of IV normal saline, 100 mg IV thiamine injectionon Eliquis IV fluids.  She will be admitted to a medical telemetry bed for further evaluation and management.  PAST MEDICAL HISTORY:   Past Medical History:  Diagnosis Date   Anxiety    Breast cancer ([place])    Left breast(nipple mass) 2018   Cervical stenosis of  spine    Chicken pox    Chronic back pain    Colon polyps    COPD (chronic obstructive pulmonary disease) (HCC)    Cyst of left nipple    Depression    Hypertension    Insomnia    Personal history of radiation therapy     PAST SURGICAL HISTORY:   Past Surgical History:  Procedure Laterality Date   BACK SURGERY     x 3 (2010 x 2; 2013 x 1)  Dr. Ronnald Ramp   BREAST BIOPSY Left    core- neg   BREAST  LUMPECTOMY Left 2018   BREAST SURGERY Left    breast biopsy   CERVICAL CONE BIOPSY     CERVICAL FUSION     times 2   COLONOSCOPY WITH PROPOFOL N/A 04/18/2015   Procedure: COLONOSCOPY WITH PROPOFOL;  Surgeon: [name], MD;  Location: [facility];  Service: Endoscopy;  Laterality: N/A;   ELBOW SURGERY     2014 b/l elbows per pt ulnar nerve    ELBOW SURGERY     x2    EPIDURAL BLOCK INJECTION     Dr. Maryjean Ka    MASS EXCISION Left 04/12/2016   Procedure: EXCISION OF LEFT NIPPLE MASS;  Surgeon: Stark Klein, MD;  Location: [facility];  Service: General;  Laterality: Left;   NECK SURGERY     POSTERIOR CERVICAL FUSION/FORAMINOTOMY  03/07/2011   Procedure: POSTERIOR CERVICAL FUSION/FORAMINOTOMY LEVEL 4;  Surgeon: Eustace Moore, MD;  Location: [facility] [facility];  Service: Neurosurgery;  Laterality: N/A;  cervical three to thoracic one posterior cervical fusion    SENTINEL NODE BIOPSY Left 05/29/2016   Procedure: LEFT SENTINEL LYMPH NODE BIOPSY;  Surgeon: Stark Klein, MD;  Location: [facility] OR;  Service: General;  Laterality: Left;   TONSILLECTOMY      SOCIAL HISTORY:   Social History   Tobacco Use   Smoking status: Every Day    Packs/day: 0.25    Years: 30.00    Pack years: 7.50    Types: Cigarettes   Smokeless tobacco: Never   Tobacco comments:    work days - 6 cigarettes; weekends - ~10 - reported on 10/13/2020  Substance Use Topics   Alcohol use: Yes    Comment: weekends and some today    FAMILY HISTORY:   Family History  Problem Relation Age of Onset   Breast cancer Mother 45   Hypertension Mother    Cancer Mother        breast dx'ed 68    Cancer Father        prostate   Hypertension Father    Drug abuse Daughter    Hypertension Maternal Grandmother    Breast cancer Cousin     DRUG ALLERGIES:   Allergies  Allergen Reactions   Anastrozole    Augmentin [Amoxicillin-Pot Clavulanate]     Diarrhea    Norvasc [Amlodipine]     Swelling     Topamax [Topiramate]     Didn't like the way made feel more irritatble/sad    Sulfa Antibiotics Itching and Rash   Sulfonamide Derivatives Itching and Rash    REVIEW OF SYSTEMS:   ROS As per history of present illness. All pertinent systems were reviewed above. Constitutional, HEENT, cardiovascular, respiratory, GI, GU, musculoskeletal, neuro, psychiatric, endocrine, integumentary and hematologic systems were reviewed and are otherwise negative/unremarkable except for positive findings mentioned above in the HPI.   MEDICATIONS AT HOME:   Prior to Admission medications  Medication Sig Start Date End Date Taking? Authorizing Provider  albuterol (VENTOLIN HFA) 108 (90 Base) MCG/ACT inhaler Inhale 1-2 puffs into the lungs every 4 (four) hours as needed for wheezing or shortness of breath. 05/02/20   McLean-Scocuzza, Nino Glow, MD  buPROPion (WELLBUTRIN XL) 300 MG 24 hr tablet TAKE 1 TABLET BY MOUTH ONCE DAILY *NOTE DOSEAGE CHANGE* **STOP THE 450MG  DAILY** 11/10/20   Ursula Alert, MD  cyclobenzaprine (FLEXERIL) 10 MG tablet Take 10 mg by mouth every 8 (eight) hours as needed. 04/05/20   [provider]  DULoxetine (CYMBALTA) 60 MG capsule Take 1 capsule (60 mg total) by mouth 2 (two) times daily. 01/11/21   Ursula Alert, MD  fluticasone (FLONASE) 50 MCG/ACT nasal spray TAKE 2 SPRAYS INTO BOTH NOSTRILS DAILY 03/07/20   McLean-Scocuzza, Nino Glow, MD  furosemide (LASIX) 40 MG tablet Take 1 tablet (40 mg total) by mouth daily. 05/02/20   McLean-Scocuzza, Nino Glow, MD  gabapentin (NEURONTIN) 600 MG tablet Take 1,200 mg by mouth 3 (three) times daily.    [provider]  hydrOXYzine (VISTARIL) 25 MG capsule TAKE ONE CAPSULE EVERY EIGHT HOURS AS NEEDED FOR ANXIETY 09/28/20   Ursula Alert, MD  ipratropium (ATROVENT) 0.06 % nasal spray PLACE 2 SPRAYS INTO BOTH NOSTRILS 3 TIMES DAILY AS NEEDED 05/04/20   McLean-Scocuzza, Nino Glow, MD  lamoTRIgine (LAMICTAL) 100 MG tablet Take 1 tablet (100 mg  total) by mouth daily. 12/22/20   Alric Ran, MD  lamoTRIgine (LAMICTAL) 200 MG tablet Take 1 tablet (200 mg total) by mouth daily. 01/05/21   Alric Ran, MD  lamoTRIgine (LAMICTAL) 25 MG tablet Titration: In addition to the 100mg  tabs BID. Take one 25mg  tablet BID x 1 week, then two 25mg  tabs BID x 1 week, then three 25mg  tabs BID x 1 week, then transition to lamotrigine 200mg , one tab BID (separate rx on file). 01/05/21   Alric Ran, MD  letrozole (FEMARA) 2.5 MG tablet Take 1 tablet (2.5 mg total) by mouth daily. 09/20/20   Nicholas Lose, MD  metoprolol succinate (TOPROL-XL) 25 MG 24 hr tablet Take 1 tablet (25 mg total) by mouth daily. 05/02/20   McLean-Scocuzza, Nino Glow, MD  montelukast (SINGULAIR) 10 MG tablet Take 1 tablet (10 mg total) by mouth at bedtime. 05/02/20   McLean-Scocuzza, Nino Glow, MD  Multiple Vitamins-Minerals (MULTIVITAMIN) tablet Take 1 tablet by mouth daily. 07/29/18   McLean-Scocuzza, Nino Glow, MD  oxymorphone (OPANA) 10 MG tablet Take 10 mg by mouth 2 (two) times daily.    [provider]  SENEXON-S 8.6-50 MG tablet TAKE 1-2 TABLETS BY MOUTH DAILY AS NEEDED FOR MILD CONSTIPATION. 01/03/21   McLean-Scocuzza, Nino Glow, MD  telmisartan (MICARDIS) 80 MG tablet Take 1 tablet (80 mg total) by mouth daily. 05/02/20   McLean-Scocuzza, Nino Glow, MD  Tiotropium Bromide-Olodaterol (STIOLTO RESPIMAT) 2.5-2.5 MCG/ACT AERS Inhale 2 puffs into the lungs daily. 05/02/20   McLean-Scocuzza, Nino Glow, MD      VITAL SIGNS:  Blood pressure 120/61, pulse 100, temperature 97.9 F (36.6 C), temperature source Oral, resp. rate 18, SpO2 100 %.  PHYSICAL EXAMINATION:  Physical Exam  GENERAL:  66 y.o.-year-old Caucasian female patient lying in the bed with no acute distress.  EYES: Pupils equal, round, reactive to light and accommodation. No scleral icterus. Extraocular muscles intact.  HEENT: Head atraumatic, normocephalic. Oropharynx and nasopharynx clear.  NECK:  Supple, no jugular  venous distention. No thyroid enlargement, no tenderness.  LUNGS: Normal breath sounds bilaterally, no wheezing,  rales,rhonchi or crepitation. No use of accessory muscles of respiration.  CARDIOVASCULAR: Regular rate and rhythm, S1, S2 normal. No murmurs, rubs, or gallops.  ABDOMEN: Soft, nondistended, nontender. Bowel sounds present. No organomegaly or mass.  EXTREMITIES: No pedal edema, cyanosis, or clubbing.  NEUROLOGIC: Cranial nerves II through XII are intact. Muscle strength 5/5 in all extremities. Sensation intact. Gait not checked.  PSYCHIATRIC: The patient is alert and oriented x 3.  Normal affect and good eye contact. SKIN: Right elbow skin with abrasion and contusion.  LABORATORY PANEL:   CBC Recent Labs  Lab 02/16/21 2100  WBC 8.2  HGB 11.9*  HCT 36.0  PLT 246   ------------------------------------------------------------------------------------------------------------------  Chemistries  Recent Labs  Lab 02/16/21 2100  NA 134*  K 4.9  CL 97*  CO2 22  GLUCOSE 122*  BUN 70*  CREATININE 1.34*  CALCIUM 9.1  AST 415*  ALT 204*  ALKPHOS 111  BILITOT 2.0*   ------------------------------------------------------------------------------------------------------------------  Cardiac Enzymes No results for input(s): TROPONINI in the last 168 hours. ------------------------------------------------------------------------------------------------------------------  RADIOLOGY:  CT HEAD WO CONTRAST (5MM)  Result Date: 02/16/2021 CLINICAL DATA:  Head trauma EXAM: CT HEAD WITHOUT CONTRAST TECHNIQUE: Contiguous axial images were obtained from the base of the skull through the vertex without intravenous contrast. RADIATION DOSE REDUCTION: This exam was performed according to the departmental dose-optimization program which includes automated exposure control, adjustment of the mA and/or kV according to patient size and/or use of iterative reconstruction technique. COMPARISON:   CT brain 11/17/2020 FINDINGS: Brain: No evidence of acute infarction, hemorrhage, hydrocephalus, extra-axial collection or mass lesion/mass effect. Vascular: No hyperdense vessel or unexpected calcification. Carotid vascular calcification Skull: Normal. Negative for fracture or focal lesion. Sinuses/Orbits: No acute finding. Other: None IMPRESSION: Negative non contrasted CT appearance of the brain for age Electronically Signed   By: Donavan Foil M.D.   On: 02/16/2021 23:04   MR ABDOMEN MRCP WO CONTRAST  Result Date: 02/17/2021 CLINICAL DATA:  66 year old female with history of right upper quadrant abdominal pain. Nondiagnostic ultrasound examination. EXAM: MRI ABDOMEN WITHOUT CONTRAST  (INCLUDING MRCP) TECHNIQUE: Multiplanar multisequence MR imaging of the abdomen was performed. Heavily T2-weighted images of the biliary and pancreatic ducts were obtained, and three-dimensional MRCP images were rendered by post processing. COMPARISON:  No prior abdominal MRI. Abdominal ultrasound 02/16/2021. CT the abdomen and pelvis 08/26/2020. FINDINGS: Comment: Today's study is limited for detection and characterization of visceral and/or vascular lesions by lack of IV gadolinium. Lower chest: Unremarkable. Hepatobiliary: No definite suspicious cystic or solid hepatic lesions are confidently identified on today's noncontrast examination. Very mild prominence of the central intrahepatic bile ducts, similar to prior CT the abdomen and pelvis 08/26/2020. Common bile duct measures up to 10 mm in the porta hepatis. There is no filling defect in the common bile duct to suggest the presence of choledocholithiasis. Gallbladder is moderately distended. Gallbladder wall thickness is normal. No pericholecystic fluid or surrounding inflammatory changes. Pancreas: No definite solid pancreatic mass noted on today's noncontrast examination. In the anterior aspect of the pancreatic body (axial image 21 of series 4) there is a tiny 5 mm T1  hypointense, T2 hyperintense lesion. No pancreatic ductal dilatation noted on MRCP images. Spleen:  Unremarkable. Adrenals/Urinary Tract: Bilateral kidneys and the right adrenal gland are normal in appearance. No hydroureteronephrosis in the visualized portions of the abdomen. Left adrenal gland is enlarged measuring approximally 2.0 x 1.6 cm, and there is diffuse loss of signal intensity within the gland on out of phase dual  echo images, indicative of a benign adenoma. Stomach/Bowel: Visualized portions are unremarkable. Vascular/Lymphatic: No aneurysm identified in the visualized abdominal vasculature. No lymphadenopathy noted in the abdomen. Other: No significant volume of ascites noted in the visualized portions of the peritoneal cavity. Musculoskeletal: No suspicious appearing osseous lesions are noted in the visualized portions of the skeleton. IMPRESSION: 1. There is some very mild central intrahepatic biliary ductal dilatation and mild dilatation of the common bile duct. This is similar to prior CT of the abdomen 08/26/2020. No obstructing choledocholithiasis or definite pancreatic head mass identified on today's noncontrast examination. This may simply be a benign finding in this patient. However, if there is strong clinical concern and laboratory evidence of biliary tract obstruction, further evaluation with repeat abdominal MRI with and without IV gadolinium should be considered to exclude the possibility of a subtle ampullary lesion. 2. Small left adrenal adenoma. 3. 5 mm cystic lesion in the pancreatic body, likely tiny pancreatic pseudocysts, area of side branch ductal ectasia, or less likely a small side branch IPMN (intraductal papillary mucinous neoplasm). Recommend follow up pre and post contrast MRI/MRCP or pancreatic protocol CT in 2 years. This recommendation follows ACR consensus guidelines: Management of Incidental Pancreatic Cysts: A White Paper of the ACR Incidental Findings Committee. [place] [id number];[id number]. Electronically Signed   By: Vinnie Langton M.D.   On: 02/17/2021 05:14   MR 3D Recon At Scanner  Result Date: 02/17/2021 CLINICAL DATA:  66 year old female with history of right upper quadrant abdominal pain. Nondiagnostic ultrasound examination. EXAM: MRI ABDOMEN WITHOUT CONTRAST  (INCLUDING MRCP) TECHNIQUE: Multiplanar multisequence MR imaging of the abdomen was performed. Heavily T2-weighted images of the biliary and pancreatic ducts were obtained, and three-dimensional MRCP images were rendered by post processing. COMPARISON:  No prior abdominal MRI. Abdominal ultrasound 02/16/2021. CT the abdomen and pelvis 08/26/2020. FINDINGS: Comment: Today's study is limited for detection and characterization of visceral and/or vascular lesions by lack of IV gadolinium. Lower chest: Unremarkable. Hepatobiliary: No definite suspicious cystic or solid hepatic lesions are confidently identified on today's noncontrast examination. Very mild prominence of the central intrahepatic bile ducts, similar to prior CT the abdomen and pelvis 08/26/2020. Common bile duct measures up to 10 mm in the porta hepatis. There is no filling defect in the common bile duct to suggest the presence of choledocholithiasis. Gallbladder is moderately distended. Gallbladder wall thickness is normal. No pericholecystic fluid or surrounding inflammatory changes. Pancreas: No definite solid pancreatic mass noted on today's noncontrast examination. In the anterior aspect of the pancreatic body (axial image 21 of series 4) there is a tiny 5 mm T1 hypointense, T2 hyperintense lesion. No pancreatic ductal dilatation noted on MRCP images. Spleen:  Unremarkable. Adrenals/Urinary Tract: Bilateral kidneys and the right adrenal gland are normal in appearance. No hydroureteronephrosis in the visualized portions of the abdomen. Left adrenal gland is enlarged measuring approximally 2.0 x 1.6 cm, and there is diffuse loss of signal  intensity within the gland on out of phase dual echo images, indicative of a benign adenoma. Stomach/Bowel: Visualized portions are unremarkable. Vascular/Lymphatic: No aneurysm identified in the visualized abdominal vasculature. No lymphadenopathy noted in the abdomen. Other: No significant volume of ascites noted in the visualized portions of the peritoneal cavity. Musculoskeletal: No suspicious appearing osseous lesions are noted in the visualized portions of the skeleton. IMPRESSION: 1. There is some very mild central intrahepatic biliary ductal dilatation and mild dilatation of the common bile duct. This is similar to prior CT of  the abdomen 08/26/2020. No obstructing choledocholithiasis or definite pancreatic head mass identified on today's noncontrast examination. This may simply be a benign finding in this patient. However, if there is strong clinical concern and laboratory evidence of biliary tract obstruction, further evaluation with repeat abdominal MRI with and without IV gadolinium should be considered to exclude the possibility of a subtle ampullary lesion. 2. Small left adrenal adenoma. 3. 5 mm cystic lesion in the pancreatic body, likely tiny pancreatic pseudocysts, area of side branch ductal ectasia, or less likely a small side branch IPMN (intraductal papillary mucinous neoplasm). Recommend follow up pre and post contrast MRI/MRCP or pancreatic protocol CT in 2 years. This recommendation follows ACR consensus guidelines: Management of Incidental Pancreatic Cysts: A White Paper of the ACR Incidental Findings Committee. [place] 0347;[id number]. Electronically Signed   By: Vinnie Langton M.D.   On: 02/17/2021 05:14   DG Humerus Right  Result Date: 02/16/2021 CLINICAL DATA:  Fall and trauma to the right upper extremity. EXAM: RIGHT HUMERUS - 2+ VIEW COMPARISON:  Right upper extremity radiograph dated 12/30/2007. FINDINGS: There is no evidence of fracture or other focal bone lesions. Soft  tissues are unremarkable. IMPRESSION: Negative. Electronically Signed   By: Anner Crete M.D.   On: 02/16/2021 23:14   US ABDOMEN LIMITED RUQ (LIVER/GB)  Result Date: 02/16/2021 CLINICAL DATA:  Elevated LFTs EXAM: ULTRASOUND ABDOMEN LIMITED RIGHT UPPER QUADRANT COMPARISON:  CT 08/26/2020 FINDINGS: Gallbladder: No gallstones or wall thickening visualized. No sonographic Murphy sign noted by sonographer. Common bile duct: Diameter: Dilated up to 11 mm. Liver: No focal hepatic abnormality. Echogenicity within normal limits. Possible mild intra hepatic biliary dilatation. Portal vein is patent on color Doppler imaging with normal direction of blood flow towards the liver. Other: None. IMPRESSION: 1. Negative for gallstones. 2. Mild intra hepatic biliary dilatation with dilated common bile duct, similar findings on CT from 08/26/2020. Correlation with MRCP could be considered Electronically Signed   By: Donavan Foil M.D.   On: 02/16/2021 23:52      IMPRESSION AND PLAN:  Principal Problem:   Acute renal failure due to traumatic rhabdomyolysis ([place]) Active Problems:   MDD (major depressive disorder), recurrent episode, with atypical features (HCC)   Alcohol use   Chronic pain syndrome   Chronic insomnia   Fatigue   HTN (hypertension)   Anxiety and depression   Alcohol use disorder, mild, abuse   Chronic left shoulder pain   Elevated blood-pressure reading, without diagnosis of hypertension   Seizure-like activity (HCC)   Abnormal MRI, lumbar spine   Arthritis of neck   Abnormal gait   Frequent falls  1.  Altered mental status with heavy alcohol abuse and likely falls with contusions and abrasions more noticeable on the right elbow and subsequent acute rhabdomyolysis. - The patient will be admitted to a medical telemetry bed. - We will continue hydration with IV normal saline and follow serial CKs. - PT consult to be obtained to assess for ambulation. - The patient was IVC. - Psychiatry  will be following. - The patient was evaluated by psychiatry in the ER before admission.  2.  Acute kidney injury, likely prerenal due to volume depletion and acute rhabdomyolysis. - She will be hydrated with IV normal saline and renal functions will be followed.  3.  Elevated LFTs likely secondary to alcoholic hepatitis. - We will follow LFTs with hydration.  4.  Suspected alcohol abuse. - Her alcohol level was less than 10.  She  will be monitored for alcohol withdrawal.  Will place on as needed IV Ativan. - She will be on thiamine, multivitamins and folic acid.  5.  Depression. - We will continue Wellbutrin XL and Cymbalta.  6.  Peripheral neuropathy. - We will continue Neurontin.  7.  Anxiety and major depression. - We will continue her Lamictal.  8.  Emphysema. - We will continue her Stiolto.  DVT prophylaxis: Lovenox. Code Status: full code. Family Communication:  The plan of care was discussed in details with the patient (and family). I answered all questions. The patient agreed to proceed with the above mentioned plan. Further management will depend upon hospital course. Disposition Plan: Back to previous home environment Consults called: Psychiatry consult All the records are reviewed and case discussed with ED provider.  Status is: Inpatient   At the time of the admission, it appears that the appropriate admission status for this patient is inpatient.  This is judged to be reasonable and necessary in order to provide the required intensity of service to ensure the patient's safety given the presenting symptoms, physical exam findings and initial radiographic and laboratory data in the context of comorbid conditions.  The patient requires inpatient status due to high intensity of service, high risk of further deterioration and high frequency of surveillance required.  I certify that at the time of admission, it is my clinical judgment that the patient will require  inpatient hospital care extending more than 2 midnights.                            Dispo: The patient is from: Home              Anticipated d/c is to: Home              Patient currently is not medically stable to d/c.              Difficult to place patient: No     Christel Mormon M.D on 02/17/2021 at 5:35 AM  [facility]   From 7 PM-7 AM, contact night-coverage [email or web address]  CC: Primary care physician; McLean-Scocuzza, Nino Glow, MD

## 2021-02-17 NOTE — BH Assessment (Signed)
Writer spoke with patient to complete updated reassessment. Patient was coherent and oriented. She was able to provide appropriate answers to the questions. When another patient began to have behavioral problems, she voiced her concern for another patient that was moved forwarded down the hall. Throughout the conversation, she denied SI/HI and AV/H. Events prior to the ER she states she doesn't remember to well, but since she's been in the ER, she has remembered and was able to share them.

## 2021-02-17 NOTE — Progress Notes (Signed)
Patient ID: Ashley Cross, female   DOB: 06/16/1955, 66 y.o.   MRN: [id number] patient seen in the emergency room. No family at bedside. Came in after she was found locked up in the room with her dogs by police. Patient has history of alcohol abuse. She says she has been depressed and anxious since one of her daughters passed away. Follows with a psychiatrist. More clear today. Tearful at times. She did speak with her daughter Ashley Cross on the phone earlier. Received IV fluids for rhabdomyolysis. Labs improving. DC IV fluids. Patient encourage oral fluid intake. Will add some ensure. Social worker for discharge planning. Physical therapy to see patient.  Was seen by psychiatry nurse practitioner and IVC has been rescinded. Per psychiatry note patient does not meet any inpatient psychiatry admission.

## 2021-02-17 NOTE — Consult Note (Signed)
[place] [facility] Consult   Reason for Consult: Psychiatric Evaluation Referring Physician: Dr. Kerman Passey Patient Identification: Ashley Cross MRN:  [id number] Principal Diagnosis: Acute renal failure due to traumatic rhabdomyolysis [facility]) Diagnosis:  Principal Problem:   Acute renal failure due to traumatic rhabdomyolysis [facility]) Active Problems:   Alcohol use disorder, mild, abuse   MDD (major depressive disorder), recurrent, in full remission ([place])   Alcohol use   Chronic pain syndrome   Chronic insomnia   HTN (hypertension)   Chronic left shoulder pain   Abnormal MRI, lumbar spine   Arthritis of neck   Abnormal gait   Frequent falls   Total Time spent with patient: 30 minutes  Client clear and coherent today.  She denies suicidal/homicidal ideations, hallucinations, paranoia, and withdrawal symptoms.  "I don't get depressed much anymore.  I'm renting from a really sweet couple."  Reports 2 drinks 3 times a week of wine an occasionally liquor.  Denies withdrawal symptoms when not drinking.  She does smoke cigarettes, no other drug use.  After IV fluids, she appears to have cleared.  She reports seeing Dr Bunnie Philips in [facility] for spinal stenosis.    In relation to last night, "I was really sleepy and these people picked me up and took me to a rest home, no one was there.  Not dure if it was a dream.  Then, I couldn't get out of my room because I never shut the door.  I panicked and freaked out.  I was pretty wound tight."    Per Caroline Sauger PMHNP on admission to the ED: Ashley Cross is a 66 y.o. female patient presented to [facility] ED via law enforcement under involuntary commitment  status (IVC). Per IVC papers, wellness check was done on pt today and police found pt locked in a room with 3 dogs who had defecated and urinated on floor in room.  Pt reportedly has a spinal issue and is prescribed morphine which she has been taking and drinking  alcohol with.  Pt states "other people locked me in the room." Pt states she has been hearing voices. The patient was seen face-to-face by this provider; the chart was reviewed and consulted with Dr. Leonides Schanz on 02/17/2021 due to the patient's care. It was discussed with both providers that the patient does meet the criteria to be admitted to the psychiatric inpatient unit.  On evaluation, the patient is alert and oriented x 2-3, anxious, emotional but cooperative, and mood-congruent with affect. The patient does not appear to be responding to internal or external stimuli. Neither is the patient presenting with any delusional thinking. The patient currently denies auditory or visual hallucinations, but she admits at home, she was experiencing auditory hallucinations. The patient denies any suicidal, homicidal, or self-harm ideations. The patient is not presenting with any psychotic or paranoid behaviors.   HPI: Per Dr. Kerman Passey, Ashley Cross is a 66 y.o. female with a past medical history of anxiety, COPD, alcohol abuse, chronic pain, presents to the emergency department for psychiatric evaluation.  Per IVC patient had not been heard from by family in approximately 3 days.  Police went to do a welfare check and found the patient locked in the bedroom with several dogs and has been urinating and defecating in the room.  Patient states she has been drinking as well.  Per IVC report patient takes morphine for chronic pain but has been taking this medication and drinking heavy quantities of alcohol.  When asked  why the patient was locked in the room she told me that she did not know how to get out of the room.  Per IVC she had told police that she was kidnapped and placed in the room and locked in the room.  Patient's only medical complaint is pain in her right arm where she states she fell.  Good range of motion in the right arm.  Does have a scab to her right elbow but appears several days old at  least.  Past Psychiatric History: depression, anxiety  Risk to Self:  none Risk to Others:  none Prior Inpatient Therapy:  none Prior Outpatient Therapy:  Dr Shea Evans in the past  Past Medical History:  Past Medical History:  Diagnosis Date   Anxiety    Breast cancer ([place])    Left breast(nipple mass) 2018   Cervical stenosis of spine    Chicken pox    Chronic back pain    Colon polyps    COPD (chronic obstructive pulmonary disease) ([place])    Cyst of left nipple    Depression    Hypertension    Insomnia    Personal history of radiation therapy     Past Surgical History:  Procedure Laterality Date   BACK SURGERY     x 3 (2010 x 2; 2013 x 1)  Dr. Ronnald Ramp   BREAST BIOPSY Left    core- neg   BREAST LUMPECTOMY Left 2018   BREAST SURGERY Left    breast biopsy   CERVICAL CONE BIOPSY     CERVICAL FUSION     times 2   COLONOSCOPY WITH PROPOFOL N/A 04/18/2015   Procedure: COLONOSCOPY WITH PROPOFOL;  Surgeon: [name], MD;  Location: [facility] END[facility]COPY;  Service: Endoscopy;  Laterality: N/A;   ELBOW SURGERY     2014 b/l elbows per pt ulnar nerve    ELBOW SURGERY     x2    EPIDURAL BLOCK INJECTION     Dr. Maryjean Ka    MASS EXCISION Left 04/12/2016   Procedure: EXCISION OF LEFT NIPPLE MASS;  Surgeon: Stark Klein, MD;  Location: [place];  Service: General;  Laterality: Left;   NECK SURGERY     POSTERIOR CERVICAL FUSION/FORAMINOTOMY  03/07/2011   Procedure: POSTERIOR CERVICAL FUSION/FORAMINOTOMY LEVEL 4;  Surgeon: Eustace Moore, MD;  Location: [place] [facility];  Service: Neurosurgery;  Laterality: N/A;  cervical three to thoracic one posterior cervical fusion    SENTINEL NODE BIOPSY Left 05/29/2016   Procedure: LEFT SENTINEL LYMPH NODE BIOPSY;  Surgeon: Stark Klein, MD;  Location: [place];  Service: General;  Laterality: Left;   TONSILLECTOMY     Family History:  Family History  Problem Relation Age of Onset   Breast cancer Mother 86   Hypertension Mother     Cancer Mother        breast dx'ed 77    Cancer Father        prostate   Hypertension Father    Drug abuse Daughter    Hypertension Maternal Grandmother    Breast cancer Cousin    Family Psychiatric  History:  Social History:  Social History   Substance and Sexual Activity  Alcohol Use Yes   Comment: weekends and some today     Social History   Substance and Sexual Activity  Drug Use No   Comment: on opana since jan 2018    Social History   Socioeconomic History   Marital status: Divorced  Spouse name: Not on file   Number of children: 2   Years of education: Not on file   Highest education level: Not on file  Occupational History   Not on file  Tobacco Use   Smoking status: Every Day    Packs/day: 0.25    Years: 30.00    Pack years: 7.50    Types: Cigarettes   Smokeless tobacco: Never   Tobacco comments:    work days - 6 cigarettes; weekends - ~10 - reported on 10/13/2020  Vaping Use   Vaping Use: Every day  Substance and Sexual Activity   Alcohol use: Yes    Comment: weekends and some today   Drug use: No    Comment: on opana since jan 2018   Sexual activity: Not on file  Other Topics Concern   Not on file  Social History Narrative   As of 01/25/17 not sexually active of in relationship    Divorced   2 kids son and daughter (though daughter died of suicide in 04/30/17)   Son lives in [place] now as of 12/2018       Disability    Loves animals has 3 dogs           Social Determinants of Radio broadcast assistant Strain: Medium Risk   Difficulty of Paying Living Expenses: Somewhat hard  Food Insecurity: No Food Insecurity   Worried About Charity fundraiser in the Last Year: Never true   Arboriculturist in the Last Year: Never true  Transportation Needs: No Transportation Needs   Lack of Transportation (Medical): No   Lack of Transportation (Non-Medical): No  Physical Activity: Not on file  Stress: Stress Concern Present   Feeling of Stress :  Rather much  Social Connections: Unknown   Frequency of Communication with Friends and Family: More than three times a week   Frequency of Social Gatherings with Friends and Family: Not on file   Attends Religious Services: Not on file   Active Member of Clubs or Organizations: Not on file   Attends Archivist Meetings: Not on file   Marital Status: Not on file   Additional Social History:    Allergies:   Allergies  Allergen Reactions   Anastrozole    Augmentin [Amoxicillin-Pot Clavulanate]     Diarrhea    Norvasc [Amlodipine]     Swelling    Topamax [Topiramate]     Didn't like the way made feel more irritatble/sad    Sulfa Antibiotics Itching and Rash   Sulfonamide Derivatives Itching and Rash    Labs:  Results for orders placed or performed during the hospital encounter of 02/16/21 (from the past 48 hour(s))  Comprehensive metabolic panel     Status: Abnormal   Collection Time: 02/16/21  9:00 PM  Result Value Ref Range   Sodium 134 (L) 135 - 145 mmol/L   Potassium 4.9 3.5 - 5.1 mmol/L   Chloride 97 (L) 98 - 111 mmol/L   CO2 22 22 - 32 mmol/L   Glucose, Bld 122 (H) 70 - 99 mg/dL    Comment: Glucose reference range applies only to samples taken after fasting for at least 8 hours.   BUN 70 (H) 8 - 23 mg/dL   Creatinine, Ser 1.34 (H) 0.44 - 1.00 mg/dL   Calcium 9.1 8.9 - 10.3 mg/dL   Total Protein 7.7 6.5 - 8.1 g/dL   Albumin 4.1 3.5 - 5.0 g/dL  AST 415 (H) 15 - 41 U/L   ALT 204 (H) 0 - 44 U/L   Alkaline Phosphatase 111 38 - 126 U/L   Total Bilirubin 2.0 (H) 0.3 - 1.2 mg/dL   GFR, Estimated 44 (L) >60 mL/min    Comment: (NOTE) Calculated using the CKD-EPI Creatinine Equation (2021)    Anion gap 15 5 - 15    Comment: Performed at [facility], [address] [place], [place] [address]  Ethanol     Status: None   Collection Time: 02/16/21  9:00 PM  Result Value Ref Range   Alcohol, Ethyl (B) <10 <10 mg/dL    Comment: (NOTE) Lowest  detectable limit for serum alcohol is 10 mg/dL.  For medical purposes only. Performed at [facility], [address] [place], [place] [address]   Salicylate level     Status: Abnormal   Collection Time: 02/16/21  9:00 PM  Result Value Ref Range   Salicylate Lvl <0.9 (L) 7.0 - 30.0 mg/dL    Comment: Performed at [facility], [address] [place], [place] [address]  Acetaminophen level     Status: Abnormal   Collection Time: 02/16/21  9:00 PM  Result Value Ref Range   Acetaminophen (Tylenol), Serum <10 (L) 10 - 30 ug/mL    Comment: (NOTE) Therapeutic concentrations vary significantly. A range of 10-30 ug/mL  may be an effective concentration for many patients. However, some  are best treated at concentrations outside of this range. Acetaminophen concentrations >150 ug/mL at 4 hours after ingestion  and >50 ug/mL at 12 hours after ingestion are often associated with  toxic reactions.  Performed at [facility], [address] [place], [place] [address]   cbc     Status: Abnormal   Collection Time: 02/16/21  9:00 PM  Result Value Ref Range   WBC 8.2 4.0 - 10.5 K/uL   RBC 3.87 3.87 - 5.11 MIL/uL   Hemoglobin 11.9 (L) 12.0 - 15.0 g/dL   HCT 36.0 36.0 - 46.0 %   MCV 93.0 80.0 - 100.0 fL   MCH 30.7 26.0 - 34.0 pg   MCHC 33.1 30.0 - 36.0 g/dL   RDW 14.6 11.5 - 15.5 %   Platelets 246 150 - 400 K/uL   nRBC 0.2 0.0 - 0.2 %    Comment: Performed at [facility], [address] [place], [place] [address]  Urine Drug Screen, Qualitative     Status: Abnormal   Collection Time: 02/16/21  9:00 PM  Result Value Ref Range   Tricyclic, Ur Screen POSITIVE (A) NONE DETECTED   Amphetamines, Ur Screen NONE DETECTED NONE DETECTED   MDMA (Ecstasy)Ur Screen NONE DETECTED NONE DETECTED   Cocaine Metabolite,Ur [place] NONE DETECTED NONE DETECTED   Opiate, Ur Screen POSITIVE (A) NONE DETECTED   Phencyclidine (PCP) Ur S NONE DETECTED NONE DETECTED    Cannabinoid 50 Ng, Ur Ryan NONE DETECTED NONE DETECTED   Barbiturates, Ur Screen NONE DETECTED NONE DETECTED   Benzodiazepine, Ur Scrn NONE DETECTED NONE DETECTED   Methadone Scn, Ur NONE DETECTED NONE DETECTED    Comment: (NOTE) Tricyclics + metabolites, urine    Cutoff 1000 ng/mL Amphetamines + metabolites, urine  Cutoff 1000 ng/mL MDMA (Ecstasy), urine              Cutoff 500 ng/mL Cocaine Metabolite, urine          Cutoff 300 ng/mL Opiate + metabolites, urine  Cutoff 300 ng/mL Phencyclidine (PCP), urine         Cutoff 25 ng/mL Cannabinoid, urine                 Cutoff 50 ng/mL Barbiturates + metabolites, urine  Cutoff 200 ng/mL Benzodiazepine, urine              Cutoff 200 ng/mL Methadone, urine                   Cutoff 300 ng/mL  The urine drug screen provides only a preliminary, unconfirmed analytical test result and should not be used for non-medical purposes. Clinical consideration and professional judgment should be applied to any positive drug screen result due to possible interfering substances. A more specific alternate chemical method must be used in order to obtain a confirmed analytical result. Gas chromatography / mass spectrometry (GC/MS) is the preferred confirm atory method. Performed at [facility], [address] [place], [place] [address]   CK     Status: Abnormal   Collection Time: 02/16/21  9:00 PM  Result Value Ref Range   Total CK 10,744 (H) 38 - 234 U/L    Comment: RESULT CONFIRMED BY MANUAL DILUTION RH Performed at [facility], [address] [place], [place] [address]   Urinalysis, Complete w Microscopic     Status: Abnormal   Collection Time: 02/16/21  9:00 PM  Result Value Ref Range   Color, Urine YELLOW (A) YELLOW   APPearance HAZY (A) CLEAR   Specific Gravity, Urine 1.015 1.005 - 1.030   pH 5.0 5.0 - 8.0   Glucose, UA NEGATIVE NEGATIVE mg/dL   Hgb urine dipstick MODERATE (A) NEGATIVE   Bilirubin Urine NEGATIVE  NEGATIVE   Ketones, ur 20 (A) NEGATIVE mg/dL   Protein, ur 30 (A) NEGATIVE mg/dL   Nitrite NEGATIVE NEGATIVE   Leukocytes,Ua NEGATIVE NEGATIVE   RBC / HPF 0-5 0 - 5 RBC/hpf   WBC, UA 0-5 0 - 5 WBC/hpf   Bacteria, UA RARE (A) NONE SEEN   Squamous Epithelial / LPF 0-5 0 - 5   Mucus PRESENT    Budding Yeast PRESENT    Hyaline Casts, UA PRESENT     Comment: Performed at [facility], [address] [place], [place] [address]  TSH     Status: None   Collection Time: 02/16/21  9:00 PM  Result Value Ref Range   TSH 0.618 0.350 - 4.500 uIU/mL    Comment: Performed by a 3rd Generation assay with a functional sensitivity of <=0.01 uIU/mL. Performed at [facility], [address] [place], [place] [address]   Resp Panel by RT-PCR (Flu A&B, Covid) Nasopharyngeal Swab     Status: None   Collection Time: 02/16/21  9:01 PM   Specimen: Nasopharyngeal Swab; Nasopharyngeal(NP) swabs in vial transport medium  Result Value Ref Range   SARS Coronavirus 2 by RT PCR NEGATIVE NEGATIVE    Comment: (NOTE) SARS-CoV-2 target nucleic acids are NOT DETECTED.  The SARS-CoV-2 RNA is generally detectable in upper respiratory specimens during the acute phase of infection. The lowest concentration of SARS-CoV-2 viral copies this assay can detect is 138 copies/mL. A negative result does not preclude SARS-Cov-2 infection and should not be used as the sole basis for treatment or other patient management decisions. A negative result may occur with  improper specimen collection/handling, submission of specimen other than nasopharyngeal swab, presence of viral mutation(s) within the areas targeted by this assay, and  inadequate number of viral copies(<138 copies/mL). A negative result must be combined with clinical observations, patient history, and epidemiological information. The expected result is Negative.  Fact Sheet for Patients:  EntrepreneurPulse.com.au  Fact Sheet  for Healthcare Providers:  IncredibleEmployment.be  This test is no t yet approved or cleared by the [place] FDA and  has been authorized for detection and/or diagnosis of SARS-CoV-2 by FDA under an Emergency Use Authorization (EUA). This EUA will remain  in effect (meaning this test can be used) for the duration of the COVID-19 declaration under Section 564(b)(1) of the Act, 21 U.S.C.section 360bbb-3(b)(1), unless the authorization is terminated  or revoked sooner.       Influenza A by PCR NEGATIVE NEGATIVE   Influenza B by PCR NEGATIVE NEGATIVE    Comment: (NOTE) The Xpert Xpress SARS-CoV-2/FLU/RSV plus assay is intended as an aid in the diagnosis of influenza from Nasopharyngeal swab specimens and should not be used as a sole basis for treatment. Nasal washings and aspirates are unacceptable for Xpert Xpress SARS-CoV-2/FLU/RSV testing.  Fact Sheet for Patients: EntrepreneurPulse.com.au  Fact Sheet for Healthcare Providers: IncredibleEmployment.be  This test is not yet approved or cleared by the [place] FDA and has been authorized for detection and/or diagnosis of SARS-CoV-2 by FDA under an Emergency Use Authorization (EUA). This EUA will remain in effect (meaning this test can be used) for the duration of the COVID-19 declaration under Section 564(b)(1) of the Act, 21 U.S.C. section 360bbb-3(b)(1), unless the authorization is terminated or revoked.  Performed at [facility], [address] [place], [place] [address]   Ammonia     Status: None   Collection Time: 02/17/21 12:14 AM  Result Value Ref Range   Ammonia 32 9 - 35 umol/L    Comment: Performed at [facility], [address] [place], [place] [address]  CK     Status: Abnormal   Collection Time: 02/17/21  5:49 AM  Result Value Ref Range   Total CK 5,705 (H) 38 - 234 U/L    Comment: RESULT CONFIRMED BY MANUAL DILUTION  RH/SCS Performed at [facility], [address] [place],  [address]     Current Facility-Administered Medications  Medication Dose Route Frequency Provider Last Rate Last Admin   0.9 %  sodium chloride infusion   Intravenous Continuous Ward, Delice Bison, DO   Stopped at 02/17/21 0627   0.9 %  sodium chloride infusion   Intravenous Continuous Mansy, Arvella Merles, MD 125 mL/hr at 02/17/21 0627 New Bag at 02/17/21 0627   acetaminophen (TYLENOL) tablet 650 mg  650 mg Oral Q6H PRN Mansy, Jan A, MD       Or   acetaminophen (TYLENOL) suppository 650 mg  650 mg Rectal Q6H PRN Mansy, Jan A, MD       albuterol (PROVENTIL) (2.5 MG/3ML) 0.083% nebulizer solution 3 mL  3 mL Inhalation Q4H PRN Mansy, Jan A, MD       arformoterol (BROVANA) nebulizer solution 15 mcg  15 mcg Nebulization BID Mansy, Jan A, MD       And   umeclidinium bromide (INCRUSE ELLIPTA) 62.5 MCG/ACT 1 puff  1 puff Inhalation Daily Mansy, Jan A, MD       buPROPion (WELLBUTRIN XL) 24 hr tablet 150 mg  150 mg Oral Daily Patrecia Pour, NP       cyclobenzaprine (FLEXERIL) tablet 10 mg  10 mg Oral Q8H PRN Mansy, Arvella Merles, MD  DULoxetine (CYMBALTA) DR capsule 60 mg  60 mg Oral BID Mansy, Jan A, MD       enoxaparin (LOVENOX) injection 40 mg  40 mg Subcutaneous Q24H Mansy, Jan A, MD       fluticasone [facility]) 50 MCG/ACT nasal spray 2 spray  2 spray Each Nare Daily Mansy, Jan A, MD       folic acid (FOLVITE) tablet 1 mg  1 mg Oral Daily Mansy, Jan A, MD       gabapentin (NEURONTIN) tablet 1,200 mg  1,200 mg Oral TID Mansy, Jan A, MD       hydrOXYzine (VISTARIL) capsule 25 mg  25 mg Oral TID PRN Mansy, Jan A, MD       ipratropium (ATROVENT) 0.06 % nasal spray 2 spray  2 spray Each Nare TID Mansy, Jan A, MD       lamoTRIgine (LAMICTAL) tablet 200 mg  200 mg Oral Daily Mansy, Jan A, MD       lamoTRIgine (LAMICTAL) tablet 25 mg  25 mg Oral UD Mansy, Jan A, MD       letrozole [facility]) tablet 2.5 mg  2.5 mg Oral Daily Mansy, Jan A, MD        LORazepam (ATIVAN) injection 1 mg  1 mg Intravenous Once PRN Ward, Kristen N, DO       LORazepam (ATIVAN) injection 1 mg  1 mg Intravenous Q1H PRN Mansy, Jan A, MD       magnesium hydroxide (MILK OF MAGNESIA) suspension 30 mL  30 mL Oral Daily PRN Mansy, Jan A, MD       metoprolol succinate (TOPROL-XL) 24 hr tablet 25 mg  25 mg Oral Daily Mansy, Jan A, MD       montelukast (SINGULAIR) tablet 10 mg  10 mg Oral QHS Mansy, Jan A, MD       multivitamin with minerals tablet 1 tablet  1 tablet Oral Daily Mansy, Jan A, MD       ondansetron [facility]) tablet 4 mg  4 mg Oral Q6H PRN Mansy, Jan A, MD       Or   ondansetron [facility]) injection 4 mg  4 mg Intravenous Q6H PRN Mansy, Jan A, MD       oxymorphone (OPANA) tablet 10 mg  10 mg Oral BID Mansy, Jan A, MD       senna-docusate (Senokot-S) tablet 1 tablet  1 tablet Oral QHS PRN Mansy, Jan A, MD       thiamine tablet 100 mg  100 mg Oral Daily Mansy, Jan A, MD       traZODone (DESYREL) tablet 25 mg  25 mg Oral QHS PRN Mansy, Arvella Merles, MD       Current Outpatient Medications  Medication Sig Dispense Refill   albuterol (VENTOLIN HFA) 108 (90 Base) MCG/ACT inhaler Inhale 1-2 puffs into the lungs every 4 (four) hours as needed for wheezing or shortness of breath. 18 g 12   buPROPion (WELLBUTRIN XL) 300 MG 24 hr tablet TAKE 1 TABLET BY MOUTH ONCE DAILY *NOTE DOSEAGE CHANGE* **STOP THE 450MG  DAILY** 90 tablet 1   cyclobenzaprine (FLEXERIL) 10 MG tablet Take 10 mg by mouth every 8 (eight) hours as needed.     DULoxetine (CYMBALTA) 60 MG capsule Take 1 capsule (60 mg total) by mouth 2 (two) times daily. 60 capsule 1   fluticasone (FLONASE) 50 MCG/ACT nasal spray TAKE 2 SPRAYS INTO BOTH NOSTRILS DAILY 16 g 11   furosemide (LASIX) 40 MG  tablet Take 1 tablet (40 mg total) by mouth daily. 90 tablet 3   gabapentin (NEURONTIN) 600 MG tablet Take 1,200 mg by mouth 3 (three) times daily.     hydrOXYzine (VISTARIL) 25 MG capsule TAKE ONE CAPSULE EVERY EIGHT HOURS AS  NEEDED FOR ANXIETY 90 capsule 1   ipratropium (ATROVENT) 0.06 % nasal spray PLACE 2 SPRAYS INTO BOTH NOSTRILS 3 TIMES DAILY AS NEEDED 15 mL 12   lamoTRIgine (LAMICTAL) 200 MG tablet Take 1 tablet (200 mg total) by mouth daily. 60 tablet 5   letrozole (FEMARA) 2.5 MG tablet Take 1 tablet (2.5 mg total) by mouth daily. 90 tablet 3   metoprolol succinate (TOPROL-XL) 25 MG 24 hr tablet Take 1 tablet (25 mg total) by mouth daily. 90 tablet 3   montelukast (SINGULAIR) 10 MG tablet Take 1 tablet (10 mg total) by mouth at bedtime. 90 tablet 3   morphine (MSIR) 15 MG tablet Take 1 tablet by mouth every 4 (four) hours as needed. Every 4-6 hours prn     Multiple Vitamins-Minerals (MULTIVITAMIN) tablet Take 1 tablet by mouth daily. 90 tablet 3   oxyCODONE-acetaminophen (PERCOCET) 10-325 MG tablet Take 1 tablet by mouth every 8 (eight) hours as needed.     oxymorphone (OPANA) 10 MG tablet Take 10 mg by mouth 2 (two) times daily.     SENEXON-S 8.6-50 MG tablet TAKE 1-2 TABLETS BY MOUTH DAILY AS NEEDED FOR MILD CONSTIPATION. 180 tablet 1   telmisartan (MICARDIS) 80 MG tablet Take 1 tablet (80 mg total) by mouth daily. 90 tablet 3   Tiotropium Bromide-Olodaterol (STIOLTO RESPIMAT) 2.5-2.5 MCG/ACT AERS Inhale 2 puffs into the lungs daily. 4 g 11   lamoTRIgine (LAMICTAL) 100 MG tablet Take 1 tablet (100 mg total) by mouth daily. (Patient not taking: Reported on 02/17/2021) 180 tablet 0   lamoTRIgine (LAMICTAL) 25 MG tablet Titration: In addition to the 100mg  tabs BID. Take one 25mg  tablet BID x 1 week, then two 25mg  tabs BID x 1 week, then three 25mg  tabs BID x 1 week, then transition to lamotrigine 200mg , one tab BID (separate rx on file). (Patient not taking: Reported on 02/17/2021) 84 tablet 0    Musculoskeletal: Strength & Muscle Tone: decreased Gait & Station: unsteady Patient leans: N/A  Psychiatric Specialty Exam: Physical Exam Vitals and nursing note reviewed.  Constitutional:      Appearance: Normal  appearance.  HENT:     Head: Normocephalic and atraumatic.     Nose: Nose normal.  Pulmonary:     Effort: Pulmonary effort is normal.  Musculoskeletal:        General: Normal range of motion.     Cervical back: Normal range of motion.  Neurological:     General: No focal deficit present.     Mental Status: She is alert and oriented to person, place, and time.  Psychiatric:        Attention and Perception: Attention and perception normal.        Mood and Affect: Mood is anxious.        Speech: Speech normal.        Behavior: Behavior normal. Behavior is cooperative.        Thought Content: Thought content normal.        Cognition and Memory: Cognition and memory normal.        Judgment: Judgment normal.    Review of Systems  Psychiatric/Behavioral:  Positive for substance abuse. The patient is nervous/anxious and has insomnia.  All other systems reviewed and are negative.  Blood pressure 135/76, pulse 100, temperature 97.9 F (36.6 C), temperature source Oral, resp. rate 17, SpO2 96 %.There is no height or weight on file to calculate BMI.  General Appearance: Casual  Eye Contact:  Good  Speech:  Normal Rate  Volume:  Normal  Mood:  Anxious  Affect:  Congruent  Thought Process:  Coherent and Descriptions of Associations: Intact  Orientation:  Full (Time, Place, and Person)  Thought Content:  WDL and Logical  Suicidal Thoughts:  No  Homicidal Thoughts:  No  Memory:  Immediate;   Fair Recent;   Fair Remote;   Fair  Judgement:  Fair  Insight:  Fair  Psychomotor Activity:  Decreased  Concentration:  Concentration: Fair and Attention Span: Fair  Recall:  [employer] of Knowledge:  Fair  Language:  Good  Akathisia:  No  Handed:  Right  AIMS (if indicated):     Assets:  Housing Leisure Time Physical Health Resilience Social Support  ADL's:  Intact  Cognition:  WNL  Sleep:         Physical Exam: Physical Exam Vitals and nursing note reviewed.  Constitutional:       Appearance: Normal appearance.  HENT:     Head: Normocephalic and atraumatic.     Nose: Nose normal.  Pulmonary:     Effort: Pulmonary effort is normal.  Musculoskeletal:        General: Normal range of motion.     Cervical back: Normal range of motion.  Neurological:     General: No focal deficit present.     Mental Status: She is alert and oriented to person, place, and time.  Psychiatric:        Attention and Perception: Attention and perception normal.        Mood and Affect: Mood is anxious.        Speech: Speech normal.        Behavior: Behavior normal. Behavior is cooperative.        Thought Content: Thought content normal.        Cognition and Memory: Cognition and memory normal.        Judgment: Judgment normal.   Review of Systems  Psychiatric/Behavioral:  Positive for substance abuse. The patient is nervous/anxious and has insomnia.   All other systems reviewed and are negative. Blood pressure 135/76, pulse 100, temperature 97.9 F (36.6 C), temperature source Oral, resp. rate 17, SpO2 96 %. There is no height or weight on file to calculate BMI.  Treatment Plan Summary: Major depressive disorder, remission: -Wellbutrin decreased from 300 mg daily to 150 mg related to her Cymbalta and decrease risk of serotonin syndrome -Continue Cymbalta 60 mg daily  Disposition: psychiatrically stable, does not meet criteria for inpatient psychiatric admission  Waylan Boga, NP 02/17/2021 9:50 AM

## 2021-02-17 NOTE — ED Notes (Signed)
On initial round after report Pt is resting quietly in hallway without any s/s of distress.  Will continue to monitor throughout shift as ordered for any changes in behaviors and for continued safety.

## 2021-02-17 NOTE — Progress Notes (Signed)
°   02/17/21 2039  Clinical Encounter Type  Visited With Patient  Visit Type Initial  Referral From Nurse  Spiritual Encounters  Spiritual Needs Prayer;Emotional   Patient needed someone to talk to as she shared important and painful life events. Chaplain listened and engaged appropriately and ended time in prayer.Patient was comforted and encouraged as a result of the visit.
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
## 2021-02-17 NOTE — ED Notes (Signed)
Informed RN bed assigned 

## 2021-02-17 NOTE — ED Provider Notes (Signed)
Assumed care of patient at shift change.  Patient here for altered mental status.  Patient awake, alert and able to answer some questions although not appropriately.  Does not appear to be responding to internal stimuli.  History of chronic narcotic use and alcohol abuse.  Work-up thus far has shown elevated creatinine with creatinine of 1.34 and CK level greater than 10,000.  Urine does not appear infected.  TSH, alcohol level, ammonia, Tylenol, salicylate levels normal.  CT head unremarkable for any acute abnormality.  She did have elevated liver function test which appear new compared to previous and may be related to alcohol abuse as her AST is greater than ALT.  Right upper quadrant ultrasound did show ductal dilatation but this seems stable from 08/26/2020.  Given elevated liver function test however that are new and patient being altered and a poor historian, MRCP was obtained.  Discussed with Dr. Rolm Baptise with radiology who states he does not see any obstruction, choledocholithiasis.  Official read will be done by radiologist in the morning.  Will discuss with hospitalist for admission for altered mental status and nontraumatic rhabdomyolysis.  She is getting IV fluids.  Psychiatry has also seen patient and recommended inpatient psychiatric treatment once medically cleared.  She is under full IVC at this time.   4:05 AM  Consulted and discussed patient's case with hospitalist, Dr. Sidney Ace.  I have recommended admission and consulting physician agrees and will place admission orders.  Patient (and family if present) agree with this plan.   I reviewed all nursing notes, vitals, pertinent previous records.  All labs, EKGs, imaging ordered have been independently reviewed and interpreted by myself.    Suede Greenawalt, Delice Bison, DO 02/17/21 [phone]
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
## 2021-02-17 NOTE — ED Notes (Signed)
Pt up, ambulated to restroom unassisted

## 2021-02-18 LAB — COMPREHENSIVE METABOLIC PANEL
ALT: 137 U/L — ABNORMAL HIGH (ref 0–44)
AST: 169 U/L — ABNORMAL HIGH (ref 15–41)
Albumin: 3 g/dL — ABNORMAL LOW (ref 3.5–5.0)
Alkaline Phosphatase: 91 U/L (ref 38–126)
Anion gap: 5 (ref 5–15)
BUN: 15 mg/dL (ref 8–23)
CO2: 26 mmol/L (ref 22–32)
Calcium: 8.8 mg/dL — ABNORMAL LOW (ref 8.9–10.3)
Chloride: 106 mmol/L (ref 98–111)
Creatinine, Ser: 0.56 mg/dL (ref 0.44–1.00)
GFR, Estimated: >60 mL/min (ref >60)
Glucose, Bld: 106 mg/dL — ABNORMAL HIGH (ref 70–99)
Potassium: 3.6 mmol/L (ref 3.5–5.1)
Sodium: 137 mmol/L (ref 135–145)
Total Bilirubin: 1.2 mg/dL (ref 0.3–1.2)
Total Protein: 5.9 g/dL — ABNORMAL LOW (ref 6.5–8.1)

## 2021-02-18 LAB — PHOSPHORUS: Phosphorus: 3.1 mg/dL (ref 2.5–4.6)

## 2021-02-18 MED ORDER — FUROSEMIDE 40 MG PO TABS: 40.0000 mg | ORAL_TABLET | Freq: Every day | ORAL | 3 refills | Status: DC | PRN

## 2021-02-18 MED ORDER — DULOXETINE HCL 60 MG PO CPEP
60.0000 mg | ORAL_CAPSULE | Freq: Every day | ORAL | Status: DC
  Administered 2021-02-18: 60 mg via ORAL
  Filled 2021-02-18: qty 1

## 2021-02-18 MED ORDER — BUPROPION HCL ER (XL) 150 MG PO TB24: 150.0000 mg | ORAL_TABLET | Freq: Every day | ORAL | 0 refills | Status: DC

## 2021-02-18 MED ORDER — SODIUM PHOSPHATES 45 MMOLE/15ML IV SOLN
30.0000 mmol | Freq: Once | INTRAVENOUS | Status: AC
  Administered 2021-02-18: 30 mmol via INTRAVENOUS
  Filled 2021-02-18: qty 10

## 2021-02-18 MED ORDER — K PHOS MONO-SOD PHOS DI & MONO 155-852-130 MG PO TABS
500.0000 mg | ORAL_TABLET | Freq: Three times a day (TID) | ORAL | Status: DC
  Administered 2021-02-18: 500 mg via ORAL
  Filled 2021-02-18 (×3): qty 2

## 2021-02-18 MED ORDER — DULOXETINE HCL 60 MG PO CPEP: 60.0000 mg | ORAL_CAPSULE | Freq: Every day | ORAL | 0 refills | Status: DC

## 2021-02-18 MED ORDER — K PHOS MONO-SOD PHOS DI & MONO 155-852-130 MG PO TABS: 500.0000 mg | ORAL_TABLET | Freq: Three times a day (TID) | ORAL | 0 refills | Status: AC

## 2021-02-18 MED ORDER — ENSURE ENLIVE PO LIQD: 237.0000 mL | Freq: Two times a day (BID) | ORAL | 12 refills | Status: DC

## 2021-02-18 NOTE — Progress Notes (Addendum)
[facility] NAME: Airlie Blumenberg    MR#:  [id number]  DATE OF BIRTH:  1955-05-05  SUBJECTIVE:  patient overall feels better. No family at bedside. No signs of alcohol withdrawal. Did discuss about cessation from alcohol and smoking. Tells me her neighbor helps her out at home. Ambulated with physical therapy yesterday. REVIEW OF SYSTEMS:   Review of Systems  Constitutional:  Negative for chills, fever and weight loss.  HENT:  Negative for ear discharge, ear pain and nosebleeds.   Eyes:  Negative for blurred vision, pain and discharge.  Respiratory:  Negative for sputum production, shortness of breath, wheezing and stridor.   Cardiovascular:  Negative for chest pain, palpitations, orthopnea and PND.  Gastrointestinal:  Negative for abdominal pain, diarrhea, nausea and vomiting.  Genitourinary:  Negative for frequency and urgency.  Musculoskeletal:  Negative for back pain and joint pain.  Neurological:  Positive for weakness. Negative for sensory change, speech change and focal weakness.  Psychiatric/Behavioral:  Negative for depression and hallucinations. The patient is not nervous/anxious.   Tolerating Diet: yes Tolerating PT: outpatient PT  DRUG ALLERGIES:   Allergies  Allergen Reactions   Anastrozole    Augmentin [Amoxicillin-Pot Clavulanate]     Diarrhea    Norvasc [Amlodipine]     Swelling    Topamax [Topiramate]     Didn't like the way made feel more irritatble/sad    Sulfa Antibiotics Itching and Rash   Sulfonamide Derivatives Itching and Rash    VITALS:  Blood pressure (!) 152/73, pulse 97, temperature 98.9 F (37.2 C), resp. rate 16, height 5\' 4"  (1.626 m), weight 61.8 kg, SpO2 97 %.  PHYSICAL EXAMINATION:   Physical Exam  GENERAL:  66 y.o.-year-old patient lying in the bed with no acute distress.  HEENT: Head atraumatic, normocephalic. Oropharynx and nasopharynx clear.  LUNGS: Normal breath sounds  bilaterally, no wheezing, rales, rhonchi. No use of accessory muscles of respiration.  CARDIOVASCULAR: S1, S2 normal. No murmurs, rubs, or gallops.  ABDOMEN: Soft, nontender, nondistended. Bowel sounds present. No organomegaly or mass.  EXTREMITIES: No cyanosis, clubbing or edema b/l.    NEUROLOGIC: nonfocal PSYCHIATRIC:  patient is alert and oriented x 3.  SKIN: No obvious rash, lesion, or ulcer.   LABORATORY PANEL:  CBC Recent Labs  Lab 02/16/21 2100  WBC 8.2  HGB 11.9*  HCT 36.0  PLT 246    Chemistries  Recent Labs  Lab 02/17/21 0549 02/18/21 0432  NA  --  137  K  --  3.6  CL  --  106  CO2  --  26  GLUCOSE  --  106*  BUN  --  15  CREATININE  --  0.56  CALCIUM  --  8.8*  MG 2.6*  --   AST  --  169*  ALT  --  137*  ALKPHOS  --  91  BILITOT  --  1.2   Cardiac Enzymes No results for input(s): TROPONINI in the last 168 hours. RADIOLOGY:  CT HEAD WO CONTRAST (5MM)  Result Date: 02/16/2021 CLINICAL DATA:  Head trauma EXAM: CT HEAD WITHOUT CONTRAST TECHNIQUE: Contiguous axial images were obtained from the base of the skull through the vertex without intravenous contrast. RADIATION DOSE REDUCTION: This exam was performed according to the departmental dose-optimization program which includes automated exposure control, adjustment of the mA and/or kV according to patient size and/or use of iterative reconstruction technique. COMPARISON:  CT brain 11/17/2020 FINDINGS: Brain:  No evidence of acute infarction, hemorrhage, hydrocephalus, extra-axial collection or mass lesion/mass effect. Vascular: No hyperdense vessel or unexpected calcification. Carotid vascular calcification Skull: Normal. Negative for fracture or focal lesion. Sinuses/Orbits: No acute finding. Other: None IMPRESSION: Negative non contrasted CT appearance of the brain for age Electronically Signed   By: Donavan Foil M.D.   On: 02/16/2021 23:04   MR ABDOMEN MRCP WO CONTRAST  Result Date: 02/17/2021 CLINICAL DATA:   66 year old female with history of right upper quadrant abdominal pain. Nondiagnostic ultrasound examination. EXAM: MRI ABDOMEN WITHOUT CONTRAST  (INCLUDING MRCP) TECHNIQUE: Multiplanar multisequence MR imaging of the abdomen was performed. Heavily T2-weighted images of the biliary and pancreatic ducts were obtained, and three-dimensional MRCP images were rendered by post processing. COMPARISON:  No prior abdominal MRI. Abdominal ultrasound 02/16/2021. CT the abdomen and pelvis 08/26/2020. FINDINGS: Comment: Today's study is limited for detection and characterization of visceral and/or vascular lesions by lack of IV gadolinium. Lower chest: Unremarkable. Hepatobiliary: No definite suspicious cystic or solid hepatic lesions are confidently identified on today's noncontrast examination. Very mild prominence of the central intrahepatic bile ducts, similar to prior CT the abdomen and pelvis 08/26/2020. Common bile duct measures up to 10 mm in the porta hepatis. There is no filling defect in the common bile duct to suggest the presence of choledocholithiasis. Gallbladder is moderately distended. Gallbladder wall thickness is normal. No pericholecystic fluid or surrounding inflammatory changes. Pancreas: No definite solid pancreatic mass noted on today's noncontrast examination. In the anterior aspect of the pancreatic body (axial image 21 of series 4) there is a tiny 5 mm T1 hypointense, T2 hyperintense lesion. No pancreatic ductal dilatation noted on MRCP images. Spleen:  Unremarkable. Adrenals/Urinary Tract: Bilateral kidneys and the right adrenal gland are normal in appearance. No hydroureteronephrosis in the visualized portions of the abdomen. Left adrenal gland is enlarged measuring approximally 2.0 x 1.6 cm, and there is diffuse loss of signal intensity within the gland on out of phase dual echo images, indicative of a benign adenoma. Stomach/Bowel: Visualized portions are unremarkable. Vascular/Lymphatic: No  aneurysm identified in the visualized abdominal vasculature. No lymphadenopathy noted in the abdomen. Other: No significant volume of ascites noted in the visualized portions of the peritoneal cavity. Musculoskeletal: No suspicious appearing osseous lesions are noted in the visualized portions of the skeleton. IMPRESSION: 1. There is some very mild central intrahepatic biliary ductal dilatation and mild dilatation of the common bile duct. This is similar to prior CT of the abdomen 08/26/2020. No obstructing choledocholithiasis or definite pancreatic head mass identified on today's noncontrast examination. This may simply be a benign finding in this patient. However, if there is strong clinical concern and laboratory evidence of biliary tract obstruction, further evaluation with repeat abdominal MRI with and without IV gadolinium should be considered to exclude the possibility of a subtle ampullary lesion. 2. Small left adrenal adenoma. 3. 5 mm cystic lesion in the pancreatic body, likely tiny pancreatic pseudocysts, area of side branch ductal ectasia, or less likely a small side branch IPMN (intraductal papillary mucinous neoplasm). Recommend follow up pre and post contrast MRI/MRCP or pancreatic protocol CT in 2 years. This recommendation follows ACR consensus guidelines: Management of Incidental Pancreatic Cysts: A White Paper of the ACR Incidental Findings Committee. [place] 7846;96:295-284. Electronically Signed   By: Vinnie Langton M.D.   On: 02/17/2021 05:14   MR 3D Recon At Scanner  Result Date: 02/17/2021 CLINICAL DATA:  66 year old female with history of right upper quadrant  abdominal pain. Nondiagnostic ultrasound examination. EXAM: MRI ABDOMEN WITHOUT CONTRAST  (INCLUDING MRCP) TECHNIQUE: Multiplanar multisequence MR imaging of the abdomen was performed. Heavily T2-weighted images of the biliary and pancreatic ducts were obtained, and three-dimensional MRCP images were rendered by post  processing. COMPARISON:  No prior abdominal MRI. Abdominal ultrasound 02/16/2021. CT the abdomen and pelvis 08/26/2020. FINDINGS: Comment: Today's study is limited for detection and characterization of visceral and/or vascular lesions by lack of IV gadolinium. Lower chest: Unremarkable. Hepatobiliary: No definite suspicious cystic or solid hepatic lesions are confidently identified on today's noncontrast examination. Very mild prominence of the central intrahepatic bile ducts, similar to prior CT the abdomen and pelvis 08/26/2020. Common bile duct measures up to 10 mm in the porta hepatis. There is no filling defect in the common bile duct to suggest the presence of choledocholithiasis. Gallbladder is moderately distended. Gallbladder wall thickness is normal. No pericholecystic fluid or surrounding inflammatory changes. Pancreas: No definite solid pancreatic mass noted on today's noncontrast examination. In the anterior aspect of the pancreatic body (axial image 21 of series 4) there is a tiny 5 mm T1 hypointense, T2 hyperintense lesion. No pancreatic ductal dilatation noted on MRCP images. Spleen:  Unremarkable. Adrenals/Urinary Tract: Bilateral kidneys and the right adrenal gland are normal in appearance. No hydroureteronephrosis in the visualized portions of the abdomen. Left adrenal gland is enlarged measuring approximally 2.0 x 1.6 cm, and there is diffuse loss of signal intensity within the gland on out of phase dual echo images, indicative of a benign adenoma. Stomach/Bowel: Visualized portions are unremarkable. Vascular/Lymphatic: No aneurysm identified in the visualized abdominal vasculature. No lymphadenopathy noted in the abdomen. Other: No significant volume of ascites noted in the visualized portions of the peritoneal cavity. Musculoskeletal: No suspicious appearing osseous lesions are noted in the visualized portions of the skeleton. IMPRESSION: 1. There is some very mild central intrahepatic biliary  ductal dilatation and mild dilatation of the common bile duct. This is similar to prior CT of the abdomen 08/26/2020. No obstructing choledocholithiasis or definite pancreatic head mass identified on today's noncontrast examination. This may simply be a benign finding in this patient. However, if there is strong clinical concern and laboratory evidence of biliary tract obstruction, further evaluation with repeat abdominal MRI with and without IV gadolinium should be considered to exclude the possibility of a subtle ampullary lesion. 2. Small left adrenal adenoma. 3. 5 mm cystic lesion in the pancreatic body, likely tiny pancreatic pseudocysts, area of side branch ductal ectasia, or less likely a small side branch IPMN (intraductal papillary mucinous neoplasm). Recommend follow up pre and post contrast MRI/MRCP or pancreatic protocol CT in 2 years. This recommendation follows ACR consensus guidelines: Management of Incidental Pancreatic Cysts: A White Paper of the ACR Incidental Findings Committee. [place] 3536;14:431-540. Electronically Signed   By: Vinnie Langton M.D.   On: 02/17/2021 05:14   DG Humerus Right  Result Date: 02/16/2021 CLINICAL DATA:  Fall and trauma to the right upper extremity. EXAM: RIGHT HUMERUS - 2+ VIEW COMPARISON:  Right upper extremity radiograph dated 12/30/2007. FINDINGS: There is no evidence of fracture or other focal bone lesions. Soft tissues are unremarkable. IMPRESSION: Negative. Electronically Signed   By: Anner Crete M.D.   On: 02/16/2021 23:14   US ABDOMEN LIMITED RUQ (LIVER/GB)  Result Date: 02/16/2021 CLINICAL DATA:  Elevated LFTs EXAM: ULTRASOUND ABDOMEN LIMITED RIGHT UPPER QUADRANT COMPARISON:  CT 08/26/2020 FINDINGS: Gallbladder: No gallstones or wall thickening visualized. No sonographic Murphy sign noted  by sonographer. Common bile duct: Diameter: Dilated up to 11 mm. Liver: No focal hepatic abnormality. Echogenicity within normal limits. Possible mild  intra hepatic biliary dilatation. Portal vein is patent on color Doppler imaging with normal direction of blood flow towards the liver. Other: None. IMPRESSION: 1. Negative for gallstones. 2. Mild intra hepatic biliary dilatation with dilated common bile duct, similar findings on CT from 08/26/2020. Correlation with MRCP could be considered Electronically Signed   By: Donavan Foil M.D.   On: 02/16/2021 23:52   ASSESSMENT AND PLAN:  Carisa Backhaus is a 66 y.o. Caucasian female with medical history significant for anxiety, COPD, depression, hypertension and chronic back pain, who presented to emergency room with acute onset of altered mental status with confusion.  She was last seen normal on Thursday.  She is on Opana for chronic pain.  She has history of alcohol abuse and most likely has been drinking alcohol last night.  The patient was found by the police went to do a welfare check in her bed locked rooms with several dogs with feces and urine as she has been urinating and defecating in the room.  Acute metabolic encephalopathy with alcohol abuse and fall with abrasions noted on elbows acute rhabdomyolysis/acute renal failure -- patient received IV hydration. CK levels trending down. Tolerating PO fluids. -- Overall mentation much clearer after IV hydration. -- No signs symptoms suggestive of withdrawal.  Chronic alcoholism with depression and anxiety acute alcoholic hepatitis -- patient initially was under IVC. Seen by psychiatry and IVC has been rescinded. -- Psych nurse practitioner recommends continue Wellbutrin XL 150 mg daily and Cymbalta 60 mg daily. Dose adjustment made. -- Patient advised to follow-up with outpatient psychiatry -- patient is not on Lamictal. Discontinue -- she is also to receive community resources for chronic alcoholism. -- LFTs improving.  Chronic peripheral neuropathy -- continue Neurontin  Hypophosphatemia/hypokalemia -- potassium replete did -- will  give oral and IV phosphorus  ongoing tobacco abuse with history of COPD -- no respiratory distress. Patient advised smoking cessation  generalized weakness -- seen by physical therapy recommends outpatient PT   Family communication : brother mark Rickert Consults : CODE STATUS: full DVT Prophylaxis : Level of care: Telemetry Medical Status is: Inpatient  Remains inpatient appropriate because: replacing Phosphorus. If repeat labs later this afternoon stable then will discharge patient home.         TOTAL TIME TAKING CARE OF THIS PATIENT: 25 minutes.  >50% time spent on counselling and coordination of care  Note: This dictation was prepared with Dragon dictation along with smaller phrase technology. Any transcriptional errors that result from this process are unintentional.  Fritzi Mandes M.D    [facility]   CC: Primary care physician; McLean-Scocuzza, Nino Glow, MD Patient ID: Raiyah Speakman, female   DOB: 1955-09-01, 66 y.o.   MRN: [id number]

## 2021-02-18 NOTE — Progress Notes (Signed)
PHARMACY CONSULT NOTE  Pharmacy Consult for Electrolyte Monitoring and Replacement   Recent Labs: Potassium (mmol/L)  Date Value  02/18/2021 3.6   Magnesium (mg/dL)  Date Value  02/17/2021 2.6 (H)   Calcium (mg/dL)  Date Value  02/18/2021 8.8 (L)   Albumin (g/dL)  Date Value  02/18/2021 3.0 (L)   Phosphorus (mg/dL)  Date Value  02/17/2021 1.4 (L)   Sodium (mmol/L)  Date Value  02/18/2021 137   Corrected Ca: 9.6 mg/dL  Assessment: 66 y.o. female with PMH of anxiety, COPD, depression, HTN, chronic pain on Opana who presented to emergency room with acute onset of AMS. Pharmacy is asked to follow and replace electrolytes.   Goal of Therapy:  Electrolytes WNL  Plan:  500 mg Kphos neutral (each dose contains contains 16 mmol phosphorous and 2.2.mEq potassium) x 2 30 mmol IV sodium phosphate x 1 Recheck electrolytes in am  Dallie Piles ,PharmD Clinical Pharmacist 02/18/2021 8:20 AM

## 2021-02-18 NOTE — TOC Initial Note (Addendum)
Transition of Care [facility]) - Initial/Assessment Note    Patient Details  Name: Ashley Cross MRN: [id number] Date of Birth: May 12, 1955  [facility] CM/SW Contact:    Magnus Ivan, LCSW Phone Number: 02/18/2021, 12:09 PM  Clinical Narrative:                Met with patient at bedside to discuss DC planning. Patient lives alone and drives herself to appointments. PCP is Dr. Terese Door. Pharmacy is [facility].  Patient is agreeable to PT recommendation for OPPT. Prefers [facility]. CSW faxed referral to [facility] on [address].  TOC was also consulted to [facility] resources. Provided list to patient at bedside. Patient denies additional needs prior to DC.  4:30- Notified by Charge RN that patient does not have transportation for OPPT and is now requesting [place]. Also called daughter DJ per RN's request. DJ reported they have no agency preference.  Referral made to [facility] with [place]. Asked MD for [facility] orders.  Expected Discharge Plan: OP Rehab Barriers to Discharge: Continued Medical Work up   Patient Goals and CMS Choice Patient states their goals for this hospitalization and ongoing recovery are:: OPPT CMS Medicare.gov Compare Post Acute Care list provided to:: Patient Choice offered to / list presented to : Patient  Expected Discharge Plan and Services Expected Discharge Plan: OP Rehab       Living arrangements for the past 2 months: Single Family Home                                      Prior Living Arrangements/Services Living arrangements for the past 2 months: Single Family Home   Patient language and need for interpreter reviewed:: Yes Do you feel safe going back to the place where you live?: Yes      Need for Family Participation in Patient Care: Yes (Comment) Care giver support system in place?: Yes (comment)   Criminal Activity/Legal Involvement Pertinent to Current Situation/Hospitalization: No - Comment as  needed  Activities of Daily Living Home Assistive Devices/Equipment: Eyeglasses ADL Screening (condition at time of admission) Patient's cognitive ability adequate to safely complete daily activities?: Yes Is the patient deaf or have difficulty hearing?: No Does the patient have difficulty seeing, even when wearing glasses/contacts?: No Does the patient have difficulty concentrating, remembering, or making decisions?: No Patient able to express need for assistance with ADLs?: Yes Does the patient have difficulty dressing or bathing?: Yes Independently performs ADLs?: Yes (appropriate for developmental age) Does the patient have difficulty walking or climbing stairs?: Yes Weakness of Legs: Both Weakness of Arms/Hands: Both  Permission Sought/Granted Permission sought to share information with : Chartered certified accountant granted to share information with : Yes, Verbal Permission Granted     Permission granted to share info w AGENCY: OPPT        Emotional Assessment       Orientation: : Oriented to Self, Oriented to Place, Oriented to  Time, Oriented to Situation Alcohol / Substance Use: Not Applicable Psych Involvement: No (comment)  Admission diagnosis:  Dehydration [E86.0] Confusion [R41.0] Pain [R52] Elevated LFTs [R79.89] Fall, initial encounter [W19.XXXA] Non-traumatic rhabdomyolysis [M62.82] Acute renal failure due to traumatic rhabdomyolysis ([place]) [N17.9, T79.6XXA] Patient Active Problem List   Diagnosis Date Noted   Acute renal failure due to traumatic rhabdomyolysis ([place]) 02/17/2021   Spinal stenosis of lumbar region 12/20/2020  H/O cervical spine surgery 12/20/2020   Abnormal MRI, cervical spine 12/20/2020   Abnormal MRI, lumbar spine 12/20/2020   Arthritis of neck 12/20/2020   Arthritis, lumbar spine 12/20/2020   Abnormal gait 12/20/2020   Frequent falls 12/20/2020   Cervical radiculopathy 12/20/2020   Numbness of fingers of both hands  12/20/2020   Carotid bruit present 10/06/2020   Noncompliance with treatment plan 09/16/2020   Polyp of colon 09/04/2020   Intrahepatic bile duct dilation 09/04/2020   Adrenal adenoma, left 09/04/2020   Atherosclerosis of renal artery ([place]) 09/04/2020   Elevated brain natriuretic peptide (BNP) level 08/09/2020   MDD (major depressive disorder), recurrent, in full remission ([place]) 07/28/2020   Thoracic spine pain 07/28/2020   MDD (major depressive disorder), recurrent, in partial remission ([place]) 06/02/2020   Cervical spondylosis 05/24/2020   Aortic atherosclerosis ([place]) 05/02/2020   Tubular adenoma of colon 04/28/2020   High risk medication use 09/15/2019   At risk for long QT syndrome 01/13/2019   Chronic left shoulder pain 12/31/2018   Cervical postlaminectomy syndrome 10/20/2018   Alcohol use disorder, mild, abuse 09/16/2018   Pulmonary emphysema ([place]) 01/22/2018   HTN (hypertension) 05/06/2017   HLD (hyperlipidemia) 05/02/2017   Osteopenia 05/01/2017   Vitamin D deficiency 01/27/2017   Chronic pain syndrome 01/27/2017   Chronic insomnia 01/27/2017   Tobacco use disorder 01/27/2017   Malignant neoplasm of lower-outer quadrant of left breast of female, estrogen receptor positive ([place]) 04/20/2016   Alcohol use 02/07/2016   Cervical post-laminectomy syndrome 09/06/2015   Lumbar radiculopathy 10/31/2012   NEUROPATHY 12/31/2006   PCP:  McLean-Scocuzza, Nino Glow, MD Pharmacy:   [place], [place] - [address] [place] [address] Phone: [phone] Fax: [phone]     Social Determinants of Health (SDOH) Interventions    Readmission Risk Interventions Readmission Risk Prevention Plan 02/18/2021  Transportation Screening Complete  PCP or Specialist Appt within 3-5 Days Complete  HRI or Home Care Consult Complete  Social Work Consult for [place] Planning/Counseling Complete  Palliative Care Screening Not Applicable   Medication Review Press photographer) Complete  Some recent data might be hidden

## 2021-02-18 NOTE — Progress Notes (Signed)
Occupational Therapy * Physical Therapy * Speech Therapy        DATE 02/18/21 PATIENT NAME Ashley Cross PATIENT MRN [id number] DIAGNOSIS/DIAGNOSIS CODE N17.9, T79.6XXA DATE OF DISCHARGE 02/18/21 PRIMARY CARE PHYSICIAN PHONE/FAX [phone]      Dear Provider Name: Snyder        Fax: [phone]  I certify that I have examined this patient and that occupational/physical/speech therapy is necessary on an outpatient basis.    The patient has expressed interest in completing their recommended course of therapy at your location.  Once a formal order from the patient's primary care physician has been obtained, please contact him/her to schedule an appointment for evaluation at your earliest convenience.   [ X ]  Physical Therapy Evaluate and Treat      The patient's primary care physician (listed above) must furnish and be responsible for a formal order such that the recommended services may be furnished while under the primary physician's care, and that the plan of care will be established and reviewed every 30 days (or more often if condition necessitates).

## 2021-02-18 NOTE — Discharge Instructions (Signed)
Advised smoking cessation Abstain from drinking alcohol

## 2021-02-19 NOTE — Discharge Summary (Signed)
[facility] [place] NAME: Ashley Cross    MR#:  [id number]  DATE OF BIRTH:  1955-08-23  DATE OF ADMISSION:  02/16/2021 ADMITTING PHYSICIAN: Christel Mormon, MD  DATE OF DISCHARGE: 02/18/2021  PRIMARY CARE PHYSICIAN: McLean-Scocuzza, Nino Glow, MD    ADMISSION DIAGNOSIS:  Dehydration [E86.0] Confusion [R41.0] Pain [R52] Elevated LFTs [R79.89] Fall, initial encounter [W19.XXXA] Non-traumatic rhabdomyolysis [M62.82] Acute renal failure due to traumatic rhabdomyolysis ([place]) [N17.9, T79.6XXA]  DISCHARGE DIAGNOSIS:  Acute renal failure with Acute Rhabdomyolysis Alcoholic Hepatitis Electrolyte abnormality SECONDARY DIAGNOSIS:   Past Medical History:  Diagnosis Date   Anxiety    Breast cancer ([place])    Left breast(nipple mass) 2018   Cervical stenosis of spine    Chicken pox    Chronic back pain    Colon polyps    COPD (chronic obstructive pulmonary disease) ([place])    Cyst of left nipple    Depression    Hypertension    Insomnia    Personal history of radiation therapy     HOSPITAL COURSE:   Ashley Cross is a 66 y.o. Caucasian female with medical history significant for anxiety, COPD, depression, hypertension and chronic back pain, who presented to emergency room with acute onset of altered mental status with confusion.  She was last seen normal on Thursday.  She is on Opana for chronic pain.  She has history of alcohol abuse and most likely has been drinking alcohol last night.  The patient was found by the police went to do a welfare check in her bed locked rooms with several dogs with feces and urine as she has been urinating and defecating in the room.   Acute metabolic encephalopathy with alcohol abuse and fall with abrasions noted on elbows acute rhabdomyolysis/acute renal failure -- patient received IV hydration. CK levels trending down. Tolerating PO fluids. -- Overall mentation much clearer after IV hydration. -- No  signs symptoms suggestive of withdrawal.   Chronic alcoholism with depression and anxiety acute alcoholic hepatitis -- patient initially was under IVC. Seen by psychiatry and IVC has been rescinded. -- Psych nurse practitioner recommends continue Wellbutrin XL 150 mg daily and Cymbalta 60 mg daily. Dose adjustment made. -- Patient advised to follow-up with outpatient psychiatry -- patient is not on Lamictal. Discontinue -- she is also to receive community resources for chronic alcoholism. -- LFTs improving.   Chronic peripheral neuropathy -- continue Neurontin   Hypophosphatemia/hypokalemia -- potassium replete did -- will give oral and IV phosphorus--level 3.1   ongoing tobacco abuse with history of COPD -- no respiratory distress. Patient advised smoking cessation   generalized weakness -- seen by physical therapy recommends outpatient PT     Family communication : brother Ashley Cross Consults : CODE STATUS: full DVT Prophylaxis : Level of care: Telemetry Medical Status is: Inpatient   Overall nearing baseline ok to d/c home. Per pt neighbor will come to pick her up         CONSULTS OBTAINED:    DRUG ALLERGIES:   Allergies  Allergen Reactions   Anastrozole    Augmentin [Amoxicillin-Pot Clavulanate]     Diarrhea    Norvasc [Amlodipine]     Swelling    Topamax [Topiramate]     Didn't like the way made feel more irritatble/sad    Sulfa Antibiotics Itching and Rash   Sulfonamide Derivatives Itching and Rash    DISCHARGE MEDICATIONS:   Allergies as of 02/18/2021  Reactions   Anastrozole    Augmentin [amoxicillin-pot Clavulanate]    Diarrhea   Norvasc [amlodipine]    Swelling   Topamax [topiramate]    Didn't like the way made feel more irritatble/sad    Sulfa Antibiotics Itching, Rash   Sulfonamide Derivatives Itching, Rash        Medication List     STOP taking these medications    oxyCODONE-acetaminophen 10-325 MG tablet Commonly  known as: PERCOCET       TAKE these medications    albuterol 108 (90 Base) MCG/ACT inhaler Commonly known as: VENTOLIN HFA Inhale 1-2 puffs into the lungs every 4 (four) hours as needed for wheezing or shortness of breath.   buPROPion 150 MG 24 hr tablet Commonly known as: WELLBUTRIN XL Take 1 tablet (150 mg total) by mouth daily. What changed:  medication strength See the new instructions.   cyclobenzaprine 10 MG tablet Commonly known as: FLEXERIL Take 10 mg by mouth every 8 (eight) hours as needed.   DULoxetine 60 MG capsule Commonly known as: CYMBALTA Take 1 capsule (60 mg total) by mouth daily. What changed: when to take this   feeding supplement Liqd Take 237 mLs by mouth 2 (two) times daily between meals.   fluticasone 50 MCG/ACT nasal spray Commonly known as: FLONASE TAKE 2 SPRAYS INTO BOTH NOSTRILS DAILY   furosemide 40 MG tablet Commonly known as: LASIX Take 1 tablet (40 mg total) by mouth daily as needed. For edema What changed:  when to take this reasons to take this additional instructions   gabapentin 600 MG tablet Commonly known as: NEURONTIN Take 1,200 mg by mouth 3 (three) times daily.   hydrOXYzine 25 MG capsule Commonly known as: VISTARIL TAKE ONE CAPSULE EVERY EIGHT HOURS AS NEEDED FOR ANXIETY   ipratropium 0.06 % nasal spray Commonly known as: ATROVENT PLACE 2 SPRAYS INTO BOTH NOSTRILS 3 TIMES DAILY AS NEEDED   lamoTRIgine 200 MG tablet Commonly known as: LAMICTAL Take 1 tablet (200 mg total) by mouth daily. What changed: Another medication with the same name was removed. Continue taking this medication, and follow the directions you see here.   letrozole 2.5 MG tablet Commonly known as: FEMARA Take 1 tablet (2.5 mg total) by mouth daily.   metoprolol succinate 25 MG 24 hr tablet Commonly known as: TOPROL-XL Take 1 tablet (25 mg total) by mouth daily.   montelukast 10 MG tablet Commonly known as: SINGULAIR Take 1 tablet (10 mg  total) by mouth at bedtime.   morphine 15 MG tablet Commonly known as: MSIR Take 1 tablet by mouth every 4 (four) hours as needed. Every 4-6 hours prn   multivitamin tablet Take 1 tablet by mouth daily.   oxymorphone 10 MG tablet Commonly known as: OPANA Take 10 mg by mouth 2 (two) times daily.   phosphorus 155-852-130 MG tablet Commonly known as: K PHOS NEUTRAL Take 2 tablets (500 mg total) by mouth 3 (three) times daily for 2 days.   Senexon-S 8.6-50 MG tablet Generic drug: senna-docusate TAKE 1-2 TABLETS BY MOUTH DAILY AS NEEDED FOR MILD CONSTIPATION.   Stiolto Respimat 2.5-2.5 MCG/ACT Aers Generic drug: Tiotropium Bromide-Olodaterol Inhale 2 puffs into the lungs daily.   telmisartan 80 MG tablet Commonly known as: MICARDIS Take 1 tablet (80 mg total) by mouth daily.        If you experience worsening of your admission symptoms, develop shortness of breath, life threatening emergency, suicidal or homicidal thoughts you must seek medical attention immediately  by calling 911 or calling your MD immediately  if symptoms less severe.  You Must read complete instructions/literature along with all the possible adverse reactions/side effects for all the Medicines you take and that have been prescribed to you. Take any new Medicines after you have completely understood and accept all the possible adverse reactions/side effects.   Please note  You were cared for by a hospitalist during your hospital stay. If you have any questions about your discharge medications or the care you received while you were in the hospital after you are discharged, you can call the unit and asked to speak with the hospitalist on call if the hospitalist that took care of you is not available. Once you are discharged, your primary care physician will handle any further medical issues. Please note that NO REFILLS for any discharge medications will be authorized once you are discharged, as it is imperative  that you return to your primary care physician (or establish a relationship with a primary care physician if you do not have one) for your aftercare needs so that they can reassess your need for medications and monitor your lab values.  DATA REVIEW:   CBC  Recent Labs  Lab 02/16/21 2100  WBC 8.2  HGB 11.9*  HCT 36.0  PLT 246    Chemistries  Recent Labs  Lab 02/17/21 0549 02/18/21 0432  NA  --  137  K  --  3.6  CL  --  106  CO2  --  26  GLUCOSE  --  106*  BUN  --  15  CREATININE  --  0.56  CALCIUM  --  8.8*  MG 2.6*  --   AST  --  169*  ALT  --  137*  ALKPHOS  --  31  BILITOT  --  1.2    Microbiology Results   Recent Results (from the past 240 hour(s))  Resp Panel by RT-PCR (Flu A&B, Covid) Nasopharyngeal Swab     Status: None   Collection Time: 02/16/21  9:01 PM   Specimen: Nasopharyngeal Swab; Nasopharyngeal(NP) swabs in vial transport medium  Result Value Ref Range Status   SARS Coronavirus 2 by RT PCR NEGATIVE NEGATIVE Final    Comment: (NOTE) SARS-CoV-2 target nucleic acids are NOT DETECTED.  The SARS-CoV-2 RNA is generally detectable in upper respiratory specimens during the acute phase of infection. The lowest concentration of SARS-CoV-2 viral copies this assay can detect is 138 copies/mL. A negative result does not preclude SARS-Cov-2 infection and should not be used as the sole basis for treatment or other patient management decisions. A negative result may occur with  improper specimen collection/handling, submission of specimen other than nasopharyngeal swab, presence of viral mutation(s) within the areas targeted by this assay, and inadequate number of viral copies(<138 copies/mL). A negative result must be combined with clinical observations, patient history, and epidemiological information. The expected result is Negative.  Fact Sheet for Patients:  EntrepreneurPulse.com.au  Fact Sheet for Healthcare Providers:   IncredibleEmployment.be  This test is no t yet approved or cleared by the [place] FDA and  has been authorized for detection and/or diagnosis of SARS-CoV-2 by FDA under an Emergency Use Authorization (EUA). This EUA will remain  in effect (meaning this test can be used) for the duration of the COVID-19 declaration under Section 564(b)(1) of the Act, 21 U.S.C.section 360bbb-3(b)(1), unless the authorization is terminated  or revoked sooner.       Influenza A by PCR NEGATIVE  NEGATIVE Final   Influenza B by PCR NEGATIVE NEGATIVE Final    Comment: (NOTE) The Xpert Xpress SARS-CoV-2/FLU/RSV plus assay is intended as an aid in the diagnosis of influenza from Nasopharyngeal swab specimens and should not be used as a sole basis for treatment. Nasal washings and aspirates are unacceptable for Xpert Xpress SARS-CoV-2/FLU/RSV testing.  Fact Sheet for Patients: EntrepreneurPulse.com.au  Fact Sheet for Healthcare Providers: IncredibleEmployment.be  This test is not yet approved or cleared by the [place] FDA and has been authorized for detection and/or diagnosis of SARS-CoV-2 by FDA under an Emergency Use Authorization (EUA). This EUA will remain in effect (meaning this test can be used) for the duration of the COVID-19 declaration under Section 564(b)(1) of the Act, 21 U.S.C. section 360bbb-3(b)(1), unless the authorization is terminated or revoked.  Performed at [name], [address] [place],  [address]     RADIOLOGY:  No results found.   CODE STATUS:  Code Status History     Date Active Date Inactive Code Status Order ID Comments User Context   02/17/2021 0531 02/19/2021 0104 Full Code [id number]  Christel Mormon, MD ED   02/16/2021 2220 02/17/2021 0531 Full Code [id number]  Harvest Dark, MD ED   01/30/2016 0401 02/07/2016 1831 Full Code [id number]  Stark Klein, MD Inpatient         TOTAL TIME TAKING CARE OF THIS PATIENT: 40 minutes.    Fritzi Mandes M.D  [facility]    CC: Primary care physician; McLean-Scocuzza, Nino Glow, MD

## 2021-02-21 ENCOUNTER — Telehealth: Payer: Self-pay

## 2021-02-21 NOTE — Telephone Encounter (Incomplete Revision)
Transition Care Management Unsuccessful Follow-up Telephone Call  Date of discharge and from where:  TCM DC [facility] 02-18-21 Dx: dehydration  Attempts:  1st Attempt  Reason for unsuccessful TCM follow-up call:  Left voice message  Transition Care Management Unsuccessful Follow-up Telephone Call  Date of discharge and from where:  TCM DC [facility] 02-18-21 Dx: dehydration  Attempts:  2nd Attempt  Reason for unsuccessful TCM follow-up call:  Left voice message

## 2021-02-21 NOTE — Telephone Encounter (Addendum)
Transition Care Management Unsuccessful Follow-up Telephone Call  Date of discharge and from where:  TCM DC [facility] 02-18-21 Dx: dehydration  Attempts:  1st Attempt  Reason for unsuccessful TCM follow-up call:  Left voice message  Transition Care Management Unsuccessful Follow-up Telephone Call  Date of discharge and from where:  TCM DC [facility] 02-18-21 Dx: dehydration  Attempts:  2nd Attempt  Reason for unsuccessful TCM follow-up call:  Left voice message  Transition Care Management Unsuccessful Follow-up Telephone Call  Date of discharge and from where: TCM DC [facility] 02-18-21 Dx: dehydration   Attempts:  3rd Attempt  Reason for unsuccessful TCM follow-up call:  Left voice message

## 2021-02-22 ENCOUNTER — Ambulatory Visit (INDEPENDENT_AMBULATORY_CARE_PROVIDER_SITE_OTHER): Payer: PPO | Admitting: Pharmacist

## 2021-02-22 ENCOUNTER — Telehealth: Payer: Self-pay | Admitting: Pharmacist

## 2021-02-22 DIAGNOSIS — G894 Chronic pain syndrome: Secondary | ICD-10-CM

## 2021-02-22 DIAGNOSIS — I700 Atherosclerosis of aorta: Secondary | ICD-10-CM

## 2021-02-22 DIAGNOSIS — E785 Hyperlipidemia, unspecified: Secondary | ICD-10-CM

## 2021-02-22 DIAGNOSIS — M48061 Spinal stenosis, lumbar region without neurogenic claudication: Secondary | ICD-10-CM

## 2021-02-22 DIAGNOSIS — I10 Essential (primary) hypertension: Secondary | ICD-10-CM

## 2021-02-22 DIAGNOSIS — J449 Chronic obstructive pulmonary disease, unspecified: Secondary | ICD-10-CM

## 2021-02-22 NOTE — Patient Instructions (Signed)
Visit Information  Following are the goals we discussed today:  Patient Goals/Self-Care Activities Over the next 90 days, patient will:  - take medications as prescribed check blood pressure periodically, document, and provide at future appointments collaborate with provider on medication access solutions Focus on reduction in tobacco cessation        Plan: Telephone follow up appointment with care management team member scheduled for:  6 weeks   [name], PharmD, [place], CPP Clinical Pharmacist [place] at [facility] [phone]     Please call the care guide team at [phone] if you need to cancel or reschedule your appointment.   The patient verbalized understanding of instructions, educational materials, and care plan provided today and declined offer to receive copy of patient instructions, educational materials, and care plan.

## 2021-02-22 NOTE — Telephone Encounter (Signed)
Called patient for CCM visit. Patient did not answer on the first try, but did answer on second try.   Speech is slightly off today, in comparison to our previous conversation. Not fully slurred, but less succinct than usual.   Reports she had an episode "like a seizure" last night that she could not get up. She reports she started shaking, and could not stand up. Legs felt like mush. Tried to stand, fell back down. Reports she scraped up her elbows and knees. Reports blood in her shirt when she took it off last night. Reports her adoptive daughter was there and helped her up. Reports the entire episode may have lasted 3 hours. Denies loss of consciousness, and denies loss of consciousness during most of these episodes except once either last week   Reports she has had several episodes of shaking since she came home from the hospital, but no other episodes like last night. Reports her adoptive daughter is not still at the house right now, because she's at work. She reports that her son and adoptive daughter, [name], are working on getting in home care for patient, physical therapy, including an emergency line. Will collaborate w/ RN CM to for support with this.  Reviewed meds, thought patient did not feel safe getting up to grab pill bottles to review, so she reviewed by memory. She is unable to tell me if she is taking morphine or oxycodone/acetaminophen. Both have been filled in the past ~ 45 days.   She at first told me that she did not use alcohol, but later restated that she has not used alcohol since she was in the hospital. She notes that her daughter removed all alcohol from her house.   Discussed with PCP. She advised emergency evaluation given reported seizure-like episodes. Patient declined, confirmed that she has neurology follow up next week. Will collaborate w/ Care Guide team to ensure transportation is arranged. Advised patient to seek emergent care if worsening episodes and/or loss of  consciousness  Routing to PCP for FYI, RN CM and LSCW for support

## 2021-02-22 NOTE — Chronic Care Management (AMB) (Signed)
Chronic Care Management CCM Pharmacy Note  02/22/2021 Name:  Ashley Cross MRN:  [id number] DOB:  March 11, 1955  Summary: - S/p hospital admission. Reviewed medications today  Recommendations/Changes made from today's visit: - RN CM and LCSW for care management support. Care Guide referral for transportation support to neurology and primary care appointments next week  Subjective: Ashley Cross is an 66 y.o. year old female who is a primary patient of McLean-Scocuzza, Nino Glow, MD.  The CCM team was consulted for assistance with disease management and care coordination needs.    Engaged with patient by telephone for follow up visit for pharmacy case management and/or care coordination services.   Objective:  Medications Reviewed Today     Reviewed by De Hollingshead, RPH-CPP (Pharmacist) on 02/22/21 at 61  Med List Status: <None>   Medication Order Taking? Sig Documenting Provider Last Dose Status Informant  albuterol (VENTOLIN HFA) 108 (90 Base) MCG/ACT inhaler [id number] No Inhale 1-2 puffs into the lungs every 4 (four) hours as needed for wheezing or shortness of breath. McLean-Scocuzza, Nino Glow, MD 02/16/2021 Active Self  buPROPion (WELLBUTRIN XL) 150 MG 24 hr tablet [id number] No Take 1 tablet (150 mg total) by mouth daily. Fritzi Mandes, MD Taking Active   cyclobenzaprine (FLEXERIL) 10 MG tablet [id number] No Take 10 mg by mouth every 8 (eight) hours as needed. [provider] Taking Active Self           Med Note ([place]   Tue Apr 19, 2020 10:43 AM) Taking 1-2 times daily  DULoxetine (CYMBALTA) 60 MG capsule [id number] No Take 1 capsule (60 mg total) by mouth daily. Fritzi Mandes, MD Taking Active   feeding supplement (ENSURE ENLIVE / ENSURE PLUS) LIQD [id number]  Take 237 mLs by mouth 2 (two) times daily between meals. Fritzi Mandes, MD  Active   fluticasone [facility]) 50 MCG/ACT nasal spray [id number] No TAKE 2 SPRAYS INTO BOTH NOSTRILS DAILY  McLean-Scocuzza, Nino Glow, MD Taking Active Self  furosemide (LASIX) 40 MG tablet [id number] No Take 1 tablet (40 mg total) by mouth daily as needed. For edema Fritzi Mandes, MD Taking Active   gabapentin (NEURONTIN) 600 MG tablet [id number] No Take 1,200 mg by mouth 3 (three) times daily. [provider] Taking Active Self  hydrOXYzine (VISTARIL) 25 MG capsule [id number] No TAKE ONE CAPSULE EVERY EIGHT HOURS AS NEEDED FOR ANXIETY Ursula Alert, MD Taking Active Self           Med Note Darnelle Maffucci, Arville Lime   Wed Feb 22, 2021  2:36 PM) QPM  ipratropium (ATROVENT) 0.06 % nasal spray [id number] No PLACE 2 SPRAYS INTO BOTH NOSTRILS 3 TIMES DAILY AS NEEDED McLean-Scocuzza, Nino Glow, MD Taking Active Self  lamoTRIgine (LAMICTAL) 200 MG tablet [id number] No Take 1 tablet (200 mg total) by mouth daily. Alric Ran, MD Taking Active Self  letrozole [facility]) 2.5 MG tablet [id number] No Take 1 tablet (2.5 mg total) by mouth daily. Nicholas Lose, MD Taking Active Self  metoprolol succinate (TOPROL-XL) 25 MG 24 hr tablet [id number] No Take 1 tablet (25 mg total) by mouth daily. McLean-Scocuzza, Nino Glow, MD Taking Active Self  montelukast (SINGULAIR) 10 MG tablet [id number] No Take 1 tablet (10 mg total) by mouth at bedtime. McLean-Scocuzza, Nino Glow, MD Taking Active Self  morphine (MSIR) 15 MG tablet [id number] No Take 1 tablet by mouth every 4 (four) hours as needed. Every 4-6 hours prn [provider] Taking Active Self  Med Note Darnelle Maffucci, Arville Lime   Wed Feb 22, 2021  2:41 PM) 1 QAM  Multiple Vitamins-Minerals (MULTIVITAMIN) tablet [id number] No Take 1 tablet by mouth daily. McLean-Scocuzza, Nino Glow, MD 02/16/2021 Active Self  oxymorphone (OPANA) 10 MG tablet [id number] No Take 10 mg by mouth 2 (two) times daily.  Patient not taking: Reported on 02/22/2021   [provider] Not Taking Active Self  SENEXON-S 8.6-50 MG tablet [id number] No TAKE 1-2 TABLETS BY MOUTH DAILY AS NEEDED FOR  MILD CONSTIPATION. McLean-Scocuzza, Nino Glow, MD 02/16/2021 Active Self  telmisartan (MICARDIS) 80 MG tablet [id number] No Take 1 tablet (80 mg total) by mouth daily. McLean-Scocuzza, Nino Glow, MD Taking Active Self  Tiotropium Bromide-Olodaterol (STIOLTO RESPIMAT) 2.5-2.5 MCG/ACT AERS [id number] No Inhale 2 puffs into the lungs daily. McLean-Scocuzza, Nino Glow, MD Taking Active Self            Pertinent Labs:   Lab Results  Component Value Date   HGBA1C 5.4 04/28/2020   Lab Results  Component Value Date   CHOL 186 04/28/2020   HDL 95.00 04/28/2020   LDLCALC 76 04/28/2020   TRIG 73.0 04/28/2020   CHOLHDL 2 04/28/2020   Lab Results  Component Value Date   CREATININE 0.56 02/18/2021   BUN 15 02/18/2021   NA 137 02/18/2021   K 3.6 02/18/2021   CL 106 02/18/2021   CO2 26 02/18/2021    SDOH:  (Social Determinants of Health) assessments and interventions performed:  SDOH Interventions    Flowsheet Row Most Recent Value  SDOH Interventions   Financial Strain Interventions --  [manufacturer assistance]       CCM Care Plan  Review of patient past medical history, allergies, medications, health status, including review of consultants reports, laboratory and other test data, was performed as part of comprehensive evaluation and provision of chronic care management services.   Care Plan : Medication Management  Updates made by De Hollingshead, RPH-CPP since 02/22/2021 12:00 AM     Problem: COPD, Depression/Anxiety, Tobacco Abuse      Long-Range Goal: Disease Progression Prevention   Recent Progress: On track  Priority: High  Note:   Current Barriers:  Unable to independently afford treatment regimen Unable to achieve control of tobacco   Pharmacist Clinical Goal(s):  Over the next 90 days, patient will verbalize ability to afford treatment regimen Over the next 90 days, patient will achieve reduction in tobacco use through collaboration with PharmD and provider.    Interventions: 1:1 collaboration with McLean-Scocuzza, Nino Glow, MD regarding development and update of comprehensive plan of care as evidenced by provider attestation and co-signature Inter-disciplinary care team collaboration (see longitudinal plan of care) Comprehensive medication review performed; medication list updated in electronic medical record  Health Maintenance   Yearly influenza vaccination: up to date Td/Tdap vaccination: up to date Pneumonia vaccination: due COVID vaccinations: due Shingrix vaccinations: due Colonoscopy: up to date  Depression/Anxiety, Insomnia Worsened recently; current regimen: bupropion XL 150 mg, duloxetine 60 mg BID, lamotrigine 200 mg daily hydroxyzine 25 mg PRN, usually QPM;  Admitted 1/12-1/14 for altered mental status, concern for worsening mood.  See phone note for today related to evaluation for mood, neurologic condition Encouraged to continue current regimen at this time along with psych follow up. Will collaborate w/ RN CM and LCSW for support   Chronic Pain: Moderately well controlled; current regimen: oxymorphone 10 mg BID - reports the pharmacy could not get Opana so she was changed to morphine IR 15 mg  Q4H PRN pain, though there is also a recent fill for oxycodone/acetaminophen 10/325 mg, gabapentin 1200 mg TID; cyclobenzaprine 10 mg QAM PRN; followed by pain management  [facility], Dr. Meriam Sprague Using senna + docusate 2 tab daily, sometimes 3 closer to the weekend when she knows she is going to be home. Previously recommended to continue current regimen at this time along with increased focus in hydration, fiber intake.  Reviewed importance of medication adherence. Suggested writing down what times she takes her PRN medications. She declined, noting that she does not have any problems managing her medications   Chronic Obstructive Pulmonary Disease with allergies: Controlled; current treatment: Stiolto 2.5/2.5 mcg 2  puffs daily, albuterol HFA PRN, montelukast 10 mg daily, fluticasone nasal spray Enrolled in [employer] assistance for Stiolto for 2023 Recommend to continue current regimen at this time  Hypertension: Controlled; current treatment: telmisartan 80 mg daily, metoprolol succinate 25 mg daily, furosemide 40 mg daily PRN Previously recommend to continue current regimen at this time along with PCP f/u  Hx breast cancer: Appropriately managed; current regimen: letrozole 2.5 mg daily Recommend to continue current regimen at this time   Patient Goals/Self-Care Activities Over the next 90 days, patient will:  - take medications as prescribed check blood pressure periodically, document, and provide at future appointments collaborate with provider on medication access solutions Focus on reduction in tobacco cessation      Plan: Telephone follow up appointment with care management team member scheduled for:  6 weeks  [name], PharmD, [place], [place] Clinical Pharmacist [employer] [facility] [employer] [phone]

## 2021-02-22 NOTE — Telephone Encounter (Signed)
Bio daughter died suicide ~2 years ago only son I know about  She needs good outpatient therapy and psychiatry she has one and she needs to reach out to them ASAP and mention about alcohol or if social work can call and speak to someone at her psych clinic I think shes hiding fact she is doing alcohol and drinking more than she states

## 2021-02-23 ENCOUNTER — Telehealth: Payer: Self-pay

## 2021-02-23 NOTE — Telephone Encounter (Signed)
° °  Telephone encounter was:  Successful.  02/23/2021 Name: Ashley Cross MRN: [id number] DOB: May 16, 1955  Ashley Cross is a 66 y.o. year old female who is a primary care patient of McLean-Scocuzza, Nino Glow, MD . The community resource team was consulted for assistance with Transportation Needs   Care guide performed the following interventions: Spoke with patient's adoptive daughter Foster Simpson she was able to give me the information I needed to complete a transportation request for [facility] transportation.    Follow Up Plan:   I will update the patient and her daughter when I hear from [facility] transportation.  Wm Fruchter, AAS Paralegal, [place] [facility]  [address], [place] [address] ??[email or web address]   ?? [phone]   [email or web address]

## 2021-02-23 NOTE — Telephone Encounter (Signed)
° °  Telephone encounter was:  Successful.  02/23/2021 Name: Ashley Cross MRN: [id number] DOB: 01/04/1956  Ashley Cross is a 66 y.o. year old female who is a primary care patient of McLean-Scocuzza, Nino Glow, MD . The community resource team was consulted for assistance with Transportation Needs   Care guide performed the following interventions: Received email confirmation from [facility] transportation request. Patient will be called 1-2 days prior to appointment.  Spoke with patient's daughter Ashley Cross informed her that the transportation request for 02/28/21 appointment has been approved and the patient will be contacted 1-2 days prior to the appointment.  For future appointments she will need to call the [place] line [phone] per [facility].   Follow Up Plan:  No further follow up planned at this time. The patient has been provided with needed resources.  Mariesa Grieder, AAS Paralegal, [place] [facility]  [address], [place] [address] ??[email or web address]   ?? [phone]   [email or web address]

## 2021-02-23 NOTE — Telephone Encounter (Signed)
° °  Telephone encounter was:  Unsuccessful.  02/23/2021 Name: Ashley Cross MRN: [id number] DOB: Oct 11, 1955  Unsuccessful outbound call made today to assist with:  Transportation Needs   Outreach Attempt:  2nd Attempt  A HIPAA compliant voice message was left requesting a return call.  Instructed patient to call back at [phone].  Pranit Owensby, AAS Paralegal, [place] [facility]  [address], [place] [address] ??[email or web address]   ?? [phone]   [email or web address]

## 2021-02-23 NOTE — Telephone Encounter (Signed)
° °  Telephone encounter was:  Unsuccessful.  02/23/2021 Name: Ashley Cross MRN: [id number] DOB: [birth year]  Unsuccessful outbound call made today to assist with:  Transportation Needs   Outreach Attempt:  1st Attempt  A HIPAA compliant voice message was left requesting a return call.  Instructed patient to call back at [phone].  Christiana Gurevich, AAS Paralegal, [place] [facility]  [address], [place] [address] ??[email or web address]   ?? [phone]   [email or web address]

## 2021-02-23 NOTE — Telephone Encounter (Signed)
° °  Telephone encounter was:  Successful.  02/23/2021 Name: Ashley Cross MRN: [id number] DOB: [birth year]  Ashley Cross is a 66 y.o. year old female who is a primary care patient of McLean-Scocuzza, Nino Glow, MD . The community resource team was consulted for assistance with Transportation Needs   Care guide performed the following interventions: Spoke with [place] transportation they have a waitlist clients will not be able to call to schedule transportation until early March.  Follow Up Plan:   Unable to schedule transportation at this time due to wait list.  Keerthana Vanrossum, AAS Paralegal, [place] [facility]  [address], [place] [address] ??[email or web address]   ?? [phone]   [email or web address]

## 2021-02-24 ENCOUNTER — Ambulatory Visit: Payer: PPO | Admitting: *Deleted

## 2021-02-24 DIAGNOSIS — R269 Unspecified abnormalities of gait and mobility: Secondary | ICD-10-CM

## 2021-02-24 DIAGNOSIS — I10 Essential (primary) hypertension: Secondary | ICD-10-CM

## 2021-02-24 DIAGNOSIS — R296 Repeated falls: Secondary | ICD-10-CM

## 2021-02-24 NOTE — Chronic Care Management (AMB) (Signed)
°  Care Management   Follow Up Note   02/24/2021 Name: Ashley Cross MRN: [id number] DOB: 21-Nov-1955   Referred by: McLean-Scocuzza, Nino Glow, MD Reason for referral : Care Coordination [facility])   Successful telephone outreach to patient's adoptive daughter  [name].  She has not spoken with any [name].  RNCM spoke with [facility] at [place] and they have been trying to contact patient.  Provided them daughters number and requested they contact daughter.  Daughter updated.  RNCM introduced self and role.  Declines RNCM outreaches at this time but states they may be interested in the future.Would like [place] Nurse added to [facility] to provide hands on in home education.  Will request home health nursing from PCP.  Follow Up Plan: The care management team will reach out to the patient again over the next 14 days.   Hubert Azure RN, MSN RN Care Management Coordinator [place] [phone] [email or web address]

## 2021-02-24 NOTE — Patient Instructions (Signed)
Visit Information ? ?Thank you for allowing me to share the care management and care coordination services that are available to you as part of your health plan and services through your primary care provider and medical home. Please reach out to me at [phone]   if the care management/care coordination team may be of assistance to you in the future.  ? ?Bookert Guzzi RN, MSN ?RN Care Management Coordinator ?[facility] ?[phone] ?[email or web address] ? ?

## 2021-02-27 NOTE — Telephone Encounter (Signed)
Pt has been scheduled 03/08/2021

## 2021-02-28 ENCOUNTER — Encounter: Payer: Self-pay | Admitting: Neurology

## 2021-02-28 ENCOUNTER — Ambulatory Visit: Payer: PPO | Admitting: Neurology

## 2021-02-28 VITALS — BP 157/80 | HR 99 | Ht 64.0 in | Wt 136.0 lb

## 2021-02-28 DIAGNOSIS — R296 Repeated falls: Secondary | ICD-10-CM | POA: Diagnosis not present

## 2021-02-28 NOTE — Progress Notes (Signed)
[facility]  PATIENT: Ashley Cross DOB: 1956/02/03  REFERRING CLINICIAN: McLean-Scocuzza, Olivia Mackie * HISTORY FROM: Patient REASON FOR VISIT: Seizure like activity    HISTORICAL  CHIEF COMPLAINT:  Chief Complaint  Patient presents with   Follow-up    Rm 12, alone ,pt has diver today  Reports sx 2-3 weeks ago , unknown causes, witness by daughter,  states Doing well on lamotrigine 100 mg twice daily  Pt is waiting for 1st Physical therapy appt     INTERVAL HISTORY 02/28/2021:  Patient presents today for follow-up, at last visit plan was to obtain a brain MRI which did not show any acute intracranial abnormality and also to obtain a EEG which was normal.  Since last visit she had two visits to the ED, 1 for a fall and the second one after family did a welfare check because they could not get a hold of her for the past 3 days and she was found locked in her room with her 3 dogs.  She was intoxicated with alcohol.  She was admitted to the behavioral facility and discharged the next day.  Patient reports since discharge she has not drink alcohol.  She reported a long history of alcohol abuse.  Reports that she is trying to quit.  She has not seen psychiatry since discharge from the hospital. In terms of her seizure-like activity, she denies any additional typical events (periods of vision going out, cannot think straight, been out of character, yelling and confused)    HISTORY OF PRESENT ILLNESS:  This is a 66 year old woman with past medical history including anxiety/depression, hypertension, history of breast cancer chronic back pain who is presenting with seizure-like activity.  Patient's reported 2 episodes in the past couple months in which she did state that she does not have much detail/recollection but noted that her vision went away, was not able to see straight, she could not think straight,her knees buckled, she fell,  she called her PMD who then alerted EMS  to come and get patient.  Patient stated that she does recall police coming in knocking on her door but does not recall any additional information, she refused to go to the ED.  She said after the event it took about 3 days to get to her normal self.  Again she did not seek medical help with the first event.  Then a couple weeks later, she was helping her neighbor, she had another event where her neighbor described her as being out of character, yelling at her, being angry and irritable, then sitting on the chair and passing out for couple hours.  Again patient stated that he took about a couple days to come back to normal self.  She denies any previous history of seizures denies any seizure risk factor and denies any additional event that are similar.  She also report a couple falls because her knee gives out, denies any injury from the falls.  She reported her psychiatrist has started her on lamotrigine a couple weeks ago.  She is currently taking 100 mg in the morning.  Denies any side effect from the medication.     Handedness: Right   Seizure Type: Unclear, did described periods of vision going out, cannot think straight, been out of character, yelling and confused.  Current frequency: only 2 events   Any injuries from seizures: None   Seizure risk factors: None reported   Previous ASMs: Lamotrigine (use for depression/mood)  Currenty ASMs: Lamotrigine (use  for depression/mood)   ASMs side effects: None   Brain Images: CT head 2017: no acute abnormality   Previous EEGs: None    OTHER MEDICAL CONDITIONS: Anxiety, depression, hypertension, history of breast cancer, chronic back pain, COPD and hypertension  REVIEW OF SYSTEMS: Full 14 system review of systems performed and negative with exception of: As noted in the HPI  ALLERGIES: Allergies  Allergen Reactions   Anastrozole    Augmentin [Amoxicillin-Pot Clavulanate]     Diarrhea    Norvasc [Amlodipine]     Swelling    Topamax  [Topiramate]     Didn't like the way made feel more irritatble/sad    Sulfa Antibiotics Itching and Rash   Sulfonamide Derivatives Itching and Rash    HOME MEDICATIONS: Outpatient Medications Prior to Visit  Medication Sig Dispense Refill   albuterol (VENTOLIN HFA) 108 (90 Base) MCG/ACT inhaler Inhale 1-2 puffs into the lungs every 4 (four) hours as needed for wheezing or shortness of breath. 18 g 12   buPROPion (WELLBUTRIN XL) 150 MG 24 hr tablet Take 1 tablet (150 mg total) by mouth daily. 30 tablet 0   cyclobenzaprine (FLEXERIL) 10 MG tablet Take 10 mg by mouth every 8 (eight) hours as needed.     DULoxetine (CYMBALTA) 60 MG capsule Take 1 capsule (60 mg total) by mouth daily. 30 capsule 0   feeding supplement (ENSURE ENLIVE / ENSURE PLUS) LIQD Take 237 mLs by mouth 2 (two) times daily between meals. 237 mL 12   fluticasone (FLONASE) 50 MCG/ACT nasal spray TAKE 2 SPRAYS INTO BOTH NOSTRILS DAILY 16 g 11   furosemide (LASIX) 40 MG tablet Take 1 tablet (40 mg total) by mouth daily as needed. For edema 90 tablet 3   gabapentin (NEURONTIN) 600 MG tablet Take 1,200 mg by mouth 3 (three) times daily.     hydrOXYzine (VISTARIL) 25 MG capsule TAKE ONE CAPSULE EVERY EIGHT HOURS AS NEEDED FOR ANXIETY 90 capsule 1   ipratropium (ATROVENT) 0.06 % nasal spray PLACE 2 SPRAYS INTO BOTH NOSTRILS 3 TIMES DAILY AS NEEDED 15 mL 12   lamoTRIgine (LAMICTAL) 200 MG tablet Take 1 tablet (200 mg total) by mouth daily. 60 tablet 5   letrozole (FEMARA) 2.5 MG tablet Take 1 tablet (2.5 mg total) by mouth daily. 90 tablet 3   metoprolol succinate (TOPROL-XL) 25 MG 24 hr tablet Take 1 tablet (25 mg total) by mouth daily. 90 tablet 3   montelukast (SINGULAIR) 10 MG tablet Take 1 tablet (10 mg total) by mouth at bedtime. 90 tablet 3   morphine (MSIR) 15 MG tablet Take 1 tablet by mouth every 4 (four) hours as needed. Every 4-6 hours prn     Multiple Vitamins-Minerals (MULTIVITAMIN) tablet Take 1 tablet by mouth daily. 90  tablet 3   oxymorphone (OPANA) 10 MG tablet Take 10 mg by mouth 2 (two) times daily.     SENEXON-S 8.6-50 MG tablet TAKE 1-2 TABLETS BY MOUTH DAILY AS NEEDED FOR MILD CONSTIPATION. 180 tablet 1   telmisartan (MICARDIS) 80 MG tablet Take 1 tablet (80 mg total) by mouth daily. 90 tablet 3   Tiotropium Bromide-Olodaterol (STIOLTO RESPIMAT) 2.5-2.5 MCG/ACT AERS Inhale 2 puffs into the lungs daily. 4 g 11   No facility-administered medications prior to visit.    PAST MEDICAL HISTORY: Past Medical History:  Diagnosis Date   Anxiety    Breast cancer ([place])    Left breast(nipple mass) 2018   Cervical stenosis of spine    Chicken  pox    Chronic back pain    Colon polyps    COPD (chronic obstructive pulmonary disease) (HCC)    Cyst of left nipple    Depression    Hypertension    Insomnia    Personal history of radiation therapy     PAST SURGICAL HISTORY: Past Surgical History:  Procedure Laterality Date   BACK SURGERY     x 3 (04-24-08 x 2; 04-25-2011 x 1)  Dr. Ronnald Ramp   BREAST BIOPSY Left    core- neg   BREAST LUMPECTOMY Left 2016-04-24   BREAST SURGERY Left    breast biopsy   CERVICAL CONE BIOPSY     CERVICAL FUSION     times 2   COLONOSCOPY WITH PROPOFOL N/A 04/18/2015   Procedure: COLONOSCOPY WITH PROPOFOL;  Surgeon: [name], MD;  Location: [facility];  Service: Endoscopy;  Laterality: N/A;   ELBOW SURGERY     Apr 24, 2012 b/l elbows per pt ulnar nerve    ELBOW SURGERY     x2    EPIDURAL BLOCK INJECTION     Dr. Maryjean Ka    MASS EXCISION Left 04/12/2016   Procedure: EXCISION OF LEFT NIPPLE MASS;  Surgeon: Stark Klein, MD;  Location: [address];  Service: General;  Laterality: Left;   NECK SURGERY     POSTERIOR CERVICAL FUSION/FORAMINOTOMY  03/07/2011   Procedure: POSTERIOR CERVICAL FUSION/FORAMINOTOMY LEVEL 4;  Surgeon: Eustace Moore, MD;  Location: [facility];  Service: Neurosurgery;  Laterality: N/A;  cervical three to thoracic one posterior cervical fusion     SENTINEL NODE BIOPSY Left 05/29/2016   Procedure: LEFT SENTINEL LYMPH NODE BIOPSY;  Surgeon: Stark Klein, MD;  Location: [facility];  Service: General;  Laterality: Left;   TONSILLECTOMY      FAMILY HISTORY: Family History  Problem Relation Age of Onset   Breast cancer Mother 90   Hypertension Mother    Cancer Mother        breast dx'ed 67    Cancer Father        prostate   Hypertension Father    Drug abuse Daughter    Hypertension Maternal Grandmother    Breast cancer Cousin     SOCIAL HISTORY: Social History   Socioeconomic History   Marital status: Divorced    Spouse name: Not on file   Number of children: 2   Years of education: Not on file   Highest education level: Not on file  Occupational History   Not on file  Tobacco Use   Smoking status: Every Day    Packs/day: 0.25    Years: 30.00    Pack years: 7.50    Types: Cigarettes   Smokeless tobacco: Never   Tobacco comments:    work days - 6 cigarettes; weekends - ~10 - reported on 10/13/2020  Vaping Use   Vaping Use: Every day  Substance and Sexual Activity   Alcohol use: Yes    Comment: weekends and some today   Drug use: No    Comment: on opana since jan 2018   Sexual activity: Not on file  Other Topics Concern   Not on file  Social History Narrative   As of 01/25/17 not sexually active of in relationship    Divorced   2 kids son and daughter (though daughter died of suicide in 2017-04-24)   Son lives in [place] now as of 12/2018       Disability    Loves  animals has 3 dogs           Social Determinants of Radio broadcast assistant Strain: Low Risk    Difficulty of Paying Living Expenses: Not very hard  Food Insecurity: No Food Insecurity   Worried About Charity fundraiser in the Last Year: Never true   Ran Out of Food in the Last Year: Never true  Transportation Needs: No Transportation Needs   Lack of Transportation (Medical): No   Lack of Transportation (Non-Medical): No  Physical Activity:  Not on file  Stress: Stress Concern Present   Feeling of Stress : Rather much  Social Connections: Unknown   Frequency of Communication with Friends and Family: More than three times a week   Frequency of Social Gatherings with Friends and Family: Not on file   Attends Religious Services: Not on Electrical engineer or Organizations: Not on file   Attends Archivist Meetings: Not on file   Marital Status: Not on file  Intimate Partner Violence: Not At Risk   Fear of Current or Ex-Partner: No   Emotionally Abused: No   Physically Abused: No   Sexually Abused: No     PHYSICAL EXAM  GENERAL EXAM/CONSTITUTIONAL: Vitals:  Vitals:   02/28/21 1103  BP: (!) 157/80  Pulse: 99  Weight: 136 lb (61.7 kg)  Height: 5\' 4"  (1.626 m)    Body mass index is 23.34 kg/m. Wt Readings from Last 3 Encounters:  02/28/21 136 lb (61.7 kg)  02/18/21 136 lb 3.9 oz (61.8 kg)  12/07/20 130 lb (59 kg)   Patient is in no distress; well developed, nourished and groomed; neck is supple  EYES: Pupils round and reactive to light, Visual fields full to confrontation, Extraocular movements intacts,  No results found.  MUSCULOSKELETAL: Gait, strength, tone, movements noted in Neurologic exam below  NEUROLOGIC: MENTAL STATUS:  No flowsheet data found. awake, alert, oriented to person, place and time recent and remote memory intact normal attention and concentration language fluent, comprehension intact, naming intact fund of knowledge appropriate  CRANIAL NERVE:  2nd, 3rd, 4th, 6th - pupils equal and reactive to light, visual fields full to confrontation, extraocular muscles intact, no nystagmus 5th - facial sensation symmetric 7th - facial strength symmetric 8th - hearing intact 9th - palate elevates symmetrically, uvula midline 11th - shoulder shrug symmetric 12th - tongue protrusion midline  MOTOR:  normal bulk and tone, full strength in the BUE, BLE  SENSORY:  normal  and symmetric to light touch, pinprick, temperature, vibration  COORDINATION:  finger-nose-finger, fine finger movements normal  REFLEXES:  deep tendon reflexes present and symmetric  GAIT/STATION:  normal    DIAGNOSTIC DATA (LABS, IMAGING, TESTING) - I reviewed patient records, labs, notes, testing and imaging myself where available.  Lab Results  Component Value Date   WBC 8.2 02/16/2021   HGB 11.9 (L) 02/16/2021   HCT 36.0 02/16/2021   MCV 93.0 02/16/2021   PLT 246 02/16/2021      Component Value Date/Time   NA 137 02/18/2021 0432   K 3.6 02/18/2021 0432   CL 106 02/18/2021 0432   CO2 26 02/18/2021 0432   GLUCOSE 106 (H) 02/18/2021 0432   BUN 15 02/18/2021 0432   CREATININE 0.56 02/18/2021 0432   CREATININE 0.73 08/05/2020 1656   CALCIUM 8.8 (L) 02/18/2021 0432   PROT 5.9 (L) 02/18/2021 0432   ALBUMIN 3.0 (L) 02/18/2021 0432   AST 169 (H) 02/18/2021 1610  AST 25 02/13/2017 0936   ALT 137 (H) 02/18/2021 0432   ALT 22 02/13/2017 0936   ALKPHOS 91 02/18/2021 0432   BILITOT 1.2 02/18/2021 0432   BILITOT 0.7 02/13/2017 0936   GFRNONAA >60 02/18/2021 0432   GFRNONAA >60 02/13/2017 0936   GFRAA >60 02/13/2017 0936   Lab Results  Component Value Date   CHOL 186 04/28/2020   HDL 95.00 04/28/2020   LDLCALC 76 04/28/2020   TRIG 73.0 04/28/2020   Lab Results  Component Value Date   HGBA1C 5.4 04/28/2020   Lab Results  Component Value Date   KDTOIZTI45 809 02/27/2019   Lab Results  Component Value Date   TSH 0.618 02/16/2021    Head CT 2017 No acute intracranial abnormality. Mild brain parenchymal atrophy.   MRI Brain 11/28/2020 1. No metastatic disease or acute intracranial abnormality. 2. Mild for age signal changes in the cerebral white matter and pons, nonspecific but most commonly due to chronic small vessel disease.   EEG 11/21/2020 This is a normal EEG recording in the waking and drowsy state. No evidence of interictal epileptiform  discharges seen. A normal EEG does not exclude a diagnosis of epilepsy.    ASSESSMENT AND PLAN  66 y.o. year old female  with PMHx including anxiety/depression, hypertension, history of breast cancer, chronic back pain, chronic alcoholism who is presenting for follow up after 2 events concerning for seizure requiring further investigation.  Her brain MRI and EEG were within normal limits.  She is currently on lamotrigine 200 mg at bedtime and denies any seizure-like activity.  However, she was recently admitted to the hospital after being found in her room intoxicated with alcohol with her dogs after family has not heard from her for 3 days. Since discharge, patient reports not having any alcohol and she is actively trying to quit.  I have informed her that these events were less likely seizures and since her EEG and MRI brain is normal there is no need to further pursue it. Recommend follow up with psychiatry for further management of her depression.  Ok to resume driving and advise against alcohol use if she is planning to drive.     1. Falls frequently     Patient Instructions  Follow up with your primary care doctor  Okay to resume driving. Patient described only 2 episodes of vision going out, cannot think straight and yelling and confused. Work up including EEG and MRI within normal limits. Patient denies any additional episodes, it has been more than 3 months since last episode, therefore it is ok to drive.  Return if worse     Per [facility] statutes, patients with seizures are not allowed to drive until they have been seizure-free for six months.  Other recommendations include using caution when using heavy equipment or power tools. Avoid working on ladders or at heights. Take showers instead of baths.  Do not swim alone.  Ensure the water temperature is not too high on the home water heater. Do not go swimming alone. Do not lock yourself in a room alone (i.e. bathroom). When  caring for infants or small children, sit down when holding, feeding, or changing them to minimize risk of injury to the child in the event you have a seizure. Maintain good sleep hygiene. Avoid alcohol.  Also recommend adequate sleep, hydration, good diet and minimize stress.   During the Seizure  - First, ensure adequate ventilation and place patients on the floor on their  left side  Loosen clothing around the neck and ensure the airway is patent. If the patient is clenching the teeth, do not force the mouth open with any object as this can cause severe damage - Remove all items from the surrounding that can be hazardous. The patient may be oblivious to what's happening and may not even know what he or she is doing. If the patient is confused and wandering, either gently guide him/her away and block access to outside areas - Reassure the individual and be comforting - Call 911. In most cases, the seizure ends before EMS arrives. However, there are cases when seizures may last over 3 to 5 minutes. Or the individual may have developed breathing difficulties or severe injuries. If a pregnant patient or a person with diabetes develops a seizure, it is prudent to call an ambulance. - Finally, if the patient does not regain full consciousness, then call EMS. Most patients will remain confused for about 45 to 90 minutes after a seizure, so you must use judgment in calling for help. - Avoid restraints but make sure the patient is in a bed with padded side rails - Place the individual in a lateral position with the neck slightly flexed; this will help the saliva drain from the mouth and prevent the tongue from falling backward - Remove all nearby furniture and other hazards from the area - Provide verbal assurance as the individual is regaining consciousness - Provide the patient with privacy if possible - Call for help and start treatment as ordered by the caregiver   After the Seizure (Postictal  Stage)  After a seizure, most patients experience confusion, fatigue, muscle pain and/or a headache. Thus, one should permit the individual to sleep. For the next few days, reassurance is essential. Being calm and helping reorient the person is also of importance.  Most seizures are painless and end spontaneously. Seizures are not harmful to others but can lead to complications such as stress on the lungs, brain and the heart. Individuals with prior lung problems may develop labored breathing and respiratory distress.     No orders of the defined types were placed in this encounter.    No orders of the defined types were placed in this encounter.    Return if symptoms worsen or fail to improve.    Alric Ran, MD 02/28/2021, 4:40 PM  [facility] [address] [place], [place] [address] [phone]

## 2021-02-28 NOTE — Patient Instructions (Addendum)
Follow up with your primary care doctor  Okay to resume driving. Patient described only 2 episodes of vision going out, cannot think straight and yelling and confused. Work up including EEG and MRI within normal limits. Patient denies any additional episodes, it has been more than 3 months since last episode, therefore it is ok to drive.  Return if worse

## 2021-03-01 ENCOUNTER — Ambulatory Visit: Payer: PPO | Admitting: Internal Medicine

## 2021-03-02 ENCOUNTER — Telehealth: Payer: Self-pay | Admitting: Internal Medicine

## 2021-03-02 NOTE — Telephone Encounter (Signed)
Pt daughter want to discuss pt health situation before her appt tomorrow

## 2021-03-02 NOTE — Addendum Note (Signed)
Addended by: Orland Mustard on: 03/02/2021 05:38 PM   Modules accepted: Orders

## 2021-03-03 ENCOUNTER — Ambulatory Visit: Payer: PPO | Admitting: *Deleted

## 2021-03-03 ENCOUNTER — Telehealth: Payer: Self-pay | Admitting: Internal Medicine

## 2021-03-03 ENCOUNTER — Ambulatory Visit (INDEPENDENT_AMBULATORY_CARE_PROVIDER_SITE_OTHER): Payer: PPO | Admitting: Internal Medicine

## 2021-03-03 ENCOUNTER — Other Ambulatory Visit: Payer: Self-pay

## 2021-03-03 ENCOUNTER — Encounter: Payer: Self-pay | Admitting: Internal Medicine

## 2021-03-03 VITALS — BP 162/76 | HR 76 | Temp 98.5°F | Ht 64.0 in | Wt 131.2 lb

## 2021-03-03 DIAGNOSIS — F3342 Major depressive disorder, recurrent, in full remission: Secondary | ICD-10-CM | POA: Diagnosis not present

## 2021-03-03 DIAGNOSIS — F419 Anxiety disorder, unspecified: Secondary | ICD-10-CM

## 2021-03-03 DIAGNOSIS — R748 Abnormal levels of other serum enzymes: Secondary | ICD-10-CM | POA: Diagnosis not present

## 2021-03-03 DIAGNOSIS — I10 Essential (primary) hypertension: Secondary | ICD-10-CM | POA: Diagnosis not present

## 2021-03-03 DIAGNOSIS — Z1231 Encounter for screening mammogram for malignant neoplasm of breast: Secondary | ICD-10-CM | POA: Diagnosis not present

## 2021-03-03 DIAGNOSIS — R269 Unspecified abnormalities of gait and mobility: Secondary | ICD-10-CM

## 2021-03-03 DIAGNOSIS — Z853 Personal history of malignant neoplasm of breast: Secondary | ICD-10-CM

## 2021-03-03 DIAGNOSIS — F3341 Major depressive disorder, recurrent, in partial remission: Secondary | ICD-10-CM

## 2021-03-03 DIAGNOSIS — D692 Other nonthrombocytopenic purpura: Secondary | ICD-10-CM | POA: Diagnosis not present

## 2021-03-03 DIAGNOSIS — E559 Vitamin D deficiency, unspecified: Secondary | ICD-10-CM | POA: Diagnosis not present

## 2021-03-03 DIAGNOSIS — R296 Repeated falls: Secondary | ICD-10-CM | POA: Diagnosis not present

## 2021-03-03 DIAGNOSIS — M549 Dorsalgia, unspecified: Secondary | ICD-10-CM | POA: Diagnosis not present

## 2021-03-03 DIAGNOSIS — Z9012 Acquired absence of left breast and nipple: Secondary | ICD-10-CM | POA: Diagnosis not present

## 2021-03-03 DIAGNOSIS — G8928 Other chronic postprocedural pain: Secondary | ICD-10-CM | POA: Diagnosis not present

## 2021-03-03 DIAGNOSIS — J449 Chronic obstructive pulmonary disease, unspecified: Secondary | ICD-10-CM | POA: Diagnosis not present

## 2021-03-03 DIAGNOSIS — R2689 Other abnormalities of gait and mobility: Secondary | ICD-10-CM

## 2021-03-03 DIAGNOSIS — R600 Localized edema: Secondary | ICD-10-CM

## 2021-03-03 DIAGNOSIS — F17200 Nicotine dependence, unspecified, uncomplicated: Secondary | ICD-10-CM | POA: Diagnosis not present

## 2021-03-03 DIAGNOSIS — Z923 Personal history of irradiation: Secondary | ICD-10-CM | POA: Diagnosis not present

## 2021-03-03 DIAGNOSIS — Z7951 Long term (current) use of inhaled steroids: Secondary | ICD-10-CM | POA: Diagnosis not present

## 2021-03-03 DIAGNOSIS — M7989 Other specified soft tissue disorders: Secondary | ICD-10-CM

## 2021-03-03 DIAGNOSIS — F1010 Alcohol abuse, uncomplicated: Secondary | ICD-10-CM

## 2021-03-03 DIAGNOSIS — M48061 Spinal stenosis, lumbar region without neurogenic claudication: Secondary | ICD-10-CM

## 2021-03-03 LAB — VITAMIN D 25 HYDROXY (VIT D DEFICIENCY, FRACTURES): VITD: 25.38 ng/mL — ABNORMAL LOW (ref 30.00–100.00)

## 2021-03-03 LAB — CK: Total CK: 59 U/L (ref 7–177)

## 2021-03-03 LAB — COMPREHENSIVE METABOLIC PANEL
ALT: 18 U/L (ref 0–35)
AST: 17 U/L (ref 0–37)
Albumin: 3.9 g/dL (ref 3.5–5.2)
Alkaline Phosphatase: 98 U/L (ref 39–117)
BUN: 9 mg/dL (ref 6–23)
CO2: 30 mEq/L (ref 19–32)
Calcium: 9.6 mg/dL (ref 8.4–10.5)
Chloride: 102 mEq/L (ref 96–112)
Creatinine, Ser: 0.79 mg/dL (ref 0.40–1.20)
GFR: 78.88 mL/min (ref >60.00)
Glucose, Bld: 109 mg/dL — ABNORMAL HIGH (ref 70–99)
Potassium: 4.4 mEq/L (ref 3.5–5.1)
Sodium: 139 mEq/L (ref 135–145)
Total Bilirubin: 0.4 mg/dL (ref 0.2–1.2)
Total Protein: 7 g/dL (ref 6.0–8.3)

## 2021-03-03 LAB — PHOSPHORUS: Phosphorus: 3.5 mg/dL (ref 2.3–4.6)

## 2021-03-03 LAB — MAGNESIUM: Magnesium: 1.9 mg/dL (ref 1.5–2.5)

## 2021-03-03 MED ORDER — NEBIVOLOL HCL 5 MG PO TABS: 5.0000 mg | ORAL_TABLET | Freq: Every day | ORAL | 3 refills | Status: DC

## 2021-03-03 NOTE — Telephone Encounter (Signed)
Patient daughter [name] would like for nurse give her call @ [phone] concerning her medical state info, DJ is not on the [facility] but she just want to provide info and update

## 2021-03-03 NOTE — Telephone Encounter (Signed)
Patient has been seen today 03/03/21. Message was not seen before Patient came in. Everything discussed with Patient at time of her visit

## 2021-03-03 NOTE — Chronic Care Management (AMB) (Signed)
°  Care Management   Follow Up Note   03/03/2021 Name: Ashley Cross MRN: [id number] DOB: [birth year]   Referred by: McLean-Scocuzza, Nino Glow, MD Reason for referral : Care Coordination [facility])   Successful  telepone outreach to daughter, Ashley Cross (patient did not answer).  Daughter stating patient continues to have periods of uncontrollable shaking, knows it's not seizures but at times patient very hard to respond (last episode 4 days ago).  Home Health Nurse from [facility] is arranged to come out for assessment today.  Daughter states se has not heard from [facility] for [place].  Encouraged daughter to request HHPT be arranged from [email or web address].at this afternoons assessment and to notify this RNCM if [facility] not able to add HHPT.  Encouraged daughter to continue to continue to contact PCP concerning patient driving.  Follow Up Plan: The care management team will reach out to the patient again over the next 20 days.   Hubert Azure RN, MSN RN Care Management Coordinator [place] [phone] [email or web address]

## 2021-03-03 NOTE — Progress Notes (Addendum)
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 on he did this -referred [facility] 04/2020 for f/u 06/2020 not seen as of 12/07/20   Hep C neg 01/20/16    Ct ab pelvis 08/26/20  IMPRESSION: 1. No acute abdominal/pelvic findings, mass lesions or adenopathy. 2. Stable left adrenal gland adenoma. 3. Mild central intrahepatic and common bile duct dilatation without cause.  Recommend correlation with liver function studies. 4. Aortic atherosclerosis + left renal artery +   Aortic Atherosclerosis (ICD10-I70.0).  Provider: Dr. Olivia Mackie McLean-Scocuzza-Internal Medicine

## 2021-03-03 NOTE — Patient Instructions (Addendum)
Call for therapy or psychiatry  [facility] counseling and psychiatry [facility]  [place] [address] [phone]    [facility] [place]  [address]  [place] [address]  [phone]  [facility]   Stop Metoprolol and start bystolic 5 mg tomorrow and monitor bp goal <130/<80 too low <90/<60     Dr. Nicolasa Ducking psychiatry   Phone Fax E-mail Address  [phone] Not available [place] [address]     Specialties     Psychiatry

## 2021-03-06 NOTE — Telephone Encounter (Signed)
error 

## 2021-03-07 DIAGNOSIS — J449 Chronic obstructive pulmonary disease, unspecified: Secondary | ICD-10-CM

## 2021-03-07 DIAGNOSIS — E785 Hyperlipidemia, unspecified: Secondary | ICD-10-CM | POA: Diagnosis not present

## 2021-03-07 DIAGNOSIS — I10 Essential (primary) hypertension: Secondary | ICD-10-CM

## 2021-03-07 NOTE — Addendum Note (Signed)
Addended by: Orland Mustard on: 03/07/2021 04:19 PM   Modules accepted: Orders

## 2021-03-07 NOTE — Telephone Encounter (Signed)
-----   Message from Ashley Jacobs sent at 03/03/2021  3:33 PM EST ----- Regarding: FW: [place] referral Pt called back and was given the number to [facility].  ----- Message ----- From: Ashley Jacobs Sent: 03/03/2021   3:24 PM EST To: Nino Glow McLean-Scocuzza, MD, # Subject: [facility] referral                                    Good afternoon!  I called [facility] to see if they will take pt ins. They saw pt prior. A referral from the Hospital was sent into [facility] they have been trying to reach pt since 02/20/2021 to sch. Pt has not returned the call. I left pt a vm to call me and I called pt brother whose on her DPR no answer.  Thank you!

## 2021-03-08 ENCOUNTER — Telehealth: Payer: Self-pay

## 2021-03-08 ENCOUNTER — Telehealth: Payer: Self-pay | Admitting: *Deleted

## 2021-03-08 ENCOUNTER — Telehealth: Payer: PPO

## 2021-03-08 ENCOUNTER — Ambulatory Visit (INDEPENDENT_AMBULATORY_CARE_PROVIDER_SITE_OTHER): Payer: PPO | Admitting: *Deleted

## 2021-03-08 DIAGNOSIS — F1010 Alcohol abuse, uncomplicated: Secondary | ICD-10-CM

## 2021-03-08 DIAGNOSIS — F419 Anxiety disorder, unspecified: Secondary | ICD-10-CM

## 2021-03-08 DIAGNOSIS — F3341 Major depressive disorder, recurrent, in partial remission: Secondary | ICD-10-CM

## 2021-03-08 NOTE — Telephone Encounter (Signed)
-----   Message from [name], MD sent at 03/03/2021  4:11 PM EST ----- Liver enzymes normal avoid alcohol please  Vitamin D low rec D3 4000 IU daily over the counter Muscle enzymes normal  Phosphorus normal  Magnesium normal

## 2021-03-08 NOTE — Chronic Care Management (AMB) (Signed)
° °  03/08/2021  Ashley Cross 03/02/1955 [id number]  Phone call to patient's daughter DJ. Patient's daughter not available to complete initial assessment today. Brief questions answered regarding [facility] and mental health follow up. Will plan to contact patient's daughter next week to complete full assessment.  [address] [place] [phone]

## 2021-03-08 NOTE — Telephone Encounter (Signed)
°  Care Management   Follow Up Note   03/08/2021 Name: Ashley Cross MRN: [id number] DOB: 27-Dec-1955   Referred by: McLean-Scocuzza, Nino Glow, MD Reason for referral : Care Coordination (Confirm [facility])   An unsuccessful telephone outreach was attempted today. The patient was referred to the case management team for assistance with care management and care coordination.   Follow Up Plan: The care management team will reach out to the patient again over the next 10 days.   Hubert Azure RN, MSN RN Care Management Coordinator [place] [phone] [email or web address]

## 2021-03-08 NOTE — Telephone Encounter (Signed)
LM for labs

## 2021-03-10 ENCOUNTER — Telehealth: Payer: Self-pay | Admitting: Internal Medicine

## 2021-03-10 ENCOUNTER — Ambulatory Visit: Payer: PPO | Admitting: *Deleted

## 2021-03-10 ENCOUNTER — Ambulatory Visit: Payer: PPO | Admitting: Internal Medicine

## 2021-03-10 DIAGNOSIS — J449 Chronic obstructive pulmonary disease, unspecified: Secondary | ICD-10-CM

## 2021-03-10 DIAGNOSIS — I10 Essential (primary) hypertension: Secondary | ICD-10-CM

## 2021-03-10 DIAGNOSIS — R296 Repeated falls: Secondary | ICD-10-CM

## 2021-03-10 NOTE — Chronic Care Management (AMB) (Signed)
°  Care Management   Follow Up Note   03/10/2021 Name: Ashley Cross MRN: [id number] DOB: May 19, 1955   Referred by: McLean-Scocuzza, Nino Glow, MD Reason for referral : Care Coordination [facility])   Successful outreach to [facility], spoke with Ashley Cross..   States they have tried to reach patient and daughter without success.  States they even sent staff to patient home and no one answered the door.  States she will make one more attempt.  Outreached to adopted daughter Ashley Cross.  Updated on information from [place].  Encouraged her to reach out to home health agency.  States she will call.  Outreached to patient.  Answered and verified HIPAA.  States DJ is not quiet here adopted daughter but has been assisting with coordinating her care since hospitalization.  Discussed home health trying to reach her; states she does not think she needs home health at this time.  States she hopes to start back working (helping a friend.) next week.  Provided patient with number to [facility] [phone] and requested she call and speak with them  Also discussed no show with PCP this morning and directed her to call office and reschedule missed appointment.  Follow Up Plan: The care management team will reach out to the patient again over the next 10 days. To verify if she has spoken with [facility].  Hubert Azure RN, MSN RN Care Management Coordinator [place] [phone] [email or web address]

## 2021-03-10 NOTE — Telephone Encounter (Signed)
Patient no-showed today's appointment; appointment was for 03/10/21, provider notified for review of record. Letter sent for patient to call in and re-schedule.

## 2021-03-14 ENCOUNTER — Telehealth: Payer: Self-pay | Admitting: Internal Medicine

## 2021-03-14 NOTE — Telephone Encounter (Signed)
Any called from [facility] stated the patient  has reach  her prior activity level , Alvis Lemmings is no longer going to see patient for home health , Amy did discuss outpatient physical therapy

## 2021-03-15 NOTE — Telephone Encounter (Signed)
Does pt want to do outpatient PT?

## 2021-03-15 NOTE — Telephone Encounter (Signed)
For your information  

## 2021-03-17 ENCOUNTER — Telehealth: Payer: Self-pay | Admitting: *Deleted

## 2021-03-17 ENCOUNTER — Telehealth: Payer: PPO | Admitting: *Deleted

## 2021-03-17 ENCOUNTER — Telehealth: Payer: PPO

## 2021-03-17 ENCOUNTER — Telehealth: Payer: Self-pay | Admitting: Internal Medicine

## 2021-03-17 NOTE — Telephone Encounter (Signed)
See 03/14/21 telephone encounter

## 2021-03-17 NOTE — Telephone Encounter (Signed)
°  Care Management   Follow Up Note   03/17/2021 Name: Merrell Rettinger MRN: [id number] DOB: Oct 10, 1955   Referred by: McLean-Scocuzza, Nino Glow, MD Reason for referral : Care Coordination [facility])   An unsuccessful telephone outreach was attempted today. The patient was referred to the case management team for assistance with care management and care coordination.   Follow Up Plan: The care management team will reach out to the patient again over the next 10 days.   Hubert Azure RN, MSN RN Care Management Coordinator [place] [phone] [email or web address]

## 2021-03-17 NOTE — Telephone Encounter (Signed)
Patient states her feet and ankles are still swollen. States she also has round, itchy, dry skin rashes on her legs as well. States the veins in her ankles look bad as well.   Patient scheduled to seen vein and vascular provider 03/27/21.   Please advise on swelling

## 2021-03-17 NOTE — Telephone Encounter (Signed)
Patient called in stating she received a call from [name]. Unable to identify reason for call please give pt a call back.

## 2021-03-17 NOTE — Telephone Encounter (Signed)
°  Care Management   Follow Up Note   03/17/2021 Name: Ashley Cross MRN: [id number] DOB: 10/21/1955   Referred by: McLean-Scocuzza, Nino Glow, MD Reason for referral : Care Coordination   An unsuccessful telephone outreach was attempted today. The patient was referred to the case management team for assistance with care management and care coordination.   Follow Up Plan: Telephone follow up appointment with care management team member scheduled for: 03/22/21  [employer], [place] [phone]

## 2021-03-17 NOTE — Telephone Encounter (Signed)
Left message to return call 

## 2021-03-17 NOTE — Telephone Encounter (Signed)
Patient informed and verbalized understanding.   Patient is agreeable to referral

## 2021-03-20 ENCOUNTER — Telehealth: Payer: Self-pay | Admitting: Internal Medicine

## 2021-03-20 MED ORDER — TRIAMCINOLONE ACETONIDE 0.1 % EX CREA: 1.0000 "application " | TOPICAL_CREAM | Freq: Two times a day (BID) | CUTANEOUS | 0 refills | Status: DC

## 2021-03-20 NOTE — Telephone Encounter (Signed)
Is she taking her lasix 40?  Sent in Triamcinolone for leg itching  Ordering stat US legs to rule out DVT Trying to get in sooner with vascular

## 2021-03-20 NOTE — Addendum Note (Signed)
Addended by: Orland Mustard on: 03/20/2021 08:36 AM   Modules accepted: Orders

## 2021-03-20 NOTE — Telephone Encounter (Signed)
Can you all see sooner for b/l leg edema ? Ive ordered stat US r/o DVT and TMC cream for likely stasis derm changes  Apt w vasc 03/27/21

## 2021-03-20 NOTE — Addendum Note (Signed)
Addended by: Orland Mustard on: 03/20/2021 08:33 AM   Modules accepted: Orders

## 2021-03-21 ENCOUNTER — Ambulatory Visit
Admission: RE | Admit: 2021-03-21 | Discharge: 2021-03-21 | Disposition: A | Discharge status: Home / Self Care | Payer: PPO | Source: Ambulatory Visit | Attending: Internal Medicine | Admitting: Internal Medicine

## 2021-03-21 ENCOUNTER — Other Ambulatory Visit: Payer: Self-pay

## 2021-03-21 DIAGNOSIS — R600 Localized edema: Secondary | ICD-10-CM | POA: Insufficient documentation

## 2021-03-21 NOTE — Telephone Encounter (Signed)
LMTCB

## 2021-03-21 NOTE — Telephone Encounter (Signed)
It will be difficult to bring her in sooner as she also has scheduled ultrasounds to follow for her chronic renal stenosis and concern for carotid bruits.  As long as no DVTs are present and she doesn't have open wounds/weeping, I think it may be in the best interest of the patient to be able to keep her previously scheduled ultrasounds and follow up on all issues at that time.  In the interim if there is no DVT present, I would urge her to utilize compression stockings as well elevation of her lower extremities.

## 2021-03-22 ENCOUNTER — Telehealth: Payer: Self-pay | Admitting: *Deleted

## 2021-03-22 ENCOUNTER — Ambulatory Visit: Payer: PPO | Admitting: *Deleted

## 2021-03-22 ENCOUNTER — Telehealth: Payer: Self-pay | Admitting: Internal Medicine

## 2021-03-22 ENCOUNTER — Telehealth: Payer: PPO | Admitting: *Deleted

## 2021-03-22 DIAGNOSIS — R296 Repeated falls: Secondary | ICD-10-CM

## 2021-03-22 DIAGNOSIS — I10 Essential (primary) hypertension: Secondary | ICD-10-CM

## 2021-03-22 DIAGNOSIS — J449 Chronic obstructive pulmonary disease, unspecified: Secondary | ICD-10-CM

## 2021-03-22 NOTE — Telephone Encounter (Addendum)
°  Care Management   Follow Up Note   03/22/2021 Name: Ashley Cross MRN: [id number] DOB: Jun 25, 1955   Referred by: McLean-Scocuzza, Nino Glow, MD Reason for referral : Care Coordination   A second unsuccessful telephone outreach was attempted today. The patient was referred to the case management team for assistance with care management and care coordination.   Follow Up Plan: Telephone follow up appointment with care management team member scheduled for: 03/29/21  [employer], [place] [phone]

## 2021-03-22 NOTE — Patient Instructions (Signed)
Visit Information ? ?Thank you for allowing me to share the care management and care coordination services that are available to you as part of your health plan and services through your primary care provider and medical home. Please reach out to me at [phone]   if the care management/care coordination team may be of assistance to you in the future.  ? ?Erlin Gardella RN, MSN ?RN Care Management Coordinator ?[facility] ?[phone] ?[email or web address] ? ?

## 2021-03-22 NOTE — Chronic Care Management (AMB) (Signed)
°  Care Management   Follow Up Note   03/22/2021 Name: Hend Mccarrell MRN: [id number] DOB: 08/10/1955   Referred by: McLean-Scocuzza, Nino Glow, MD Reason for referral : Care Coordination (RNCM case closure)   A second unsuccessful telephone outreach was attempted today. The patient was referred to the case management team for assistance with care management and care coordination.  Unable to reach patient or daughter. On previous calls, RNCM was ableto link dauhter and patient with home health agency [facility].  Per chart review Alvis Lemmings has seen patient and assessed patient no longer needing home health (telephone call note on 2/7).  Daughter and patient previously declined RNCM outreaches, so will close RNCM case at this time.  Follow Up Plan: The patient's daughter Sarina Ill has been provided with contact information for the care management team and has been advised to call with any health related questions or concerns.   Hubert Azure RN, MSN RN Care Management Coordinator [place] [phone] [email or web address]

## 2021-03-22 NOTE — Telephone Encounter (Signed)
Patient was called with results. Nothing further needed

## 2021-03-22 NOTE — Telephone Encounter (Signed)
Patient called stating she was returning Trisha's call.

## 2021-03-23 NOTE — Telephone Encounter (Signed)
Dr. Olivia Mackie I scheduled patient appointment with you for her question on her phosphorus and for the other questions she has.

## 2021-03-23 NOTE — Telephone Encounter (Signed)
Phosphorus normal 03/03/21 she can stop  I do not manage her chronic pain and yes she needs to call the pain clinic [place] [facility] in [place]

## 2021-03-24 NOTE — Telephone Encounter (Signed)
Patient informed and verbalized understanding

## 2021-03-24 NOTE — Telephone Encounter (Signed)
She can do miralax 1-2 caps daily in 8 ounces of liquid + colace  Its likely chronic pain meds  She can also can discuss constipation with pain clinic they have medications they can prescribe her due to constipation with narcotics

## 2021-03-26 DIAGNOSIS — I6529 Occlusion and stenosis of unspecified carotid artery: Secondary | ICD-10-CM | POA: Insufficient documentation

## 2021-03-26 NOTE — Progress Notes (Unsigned)
MRN : [id number]  Ashley Cross is a 66 y.o. (1955/12/17) female who presents with chief complaint of check the kidney blood flow.  History of Present Illness:  The patient is seen for evaluation of malignant hypertension which has been very difficult to control. The patient has a long history of hypertension which recently has become somewhat difficult to control utilizing medical therapy. Today her BP is acceptable.   Recent CT scan shows moderate to severe plaque of the left renal artery   The patient does have family history of hypertension.    There is no prior documented abdominal bruit. The patient occasionally has flushing symptoms but denies palpitations. No <50% episodes of syncope.There is no history of headache. There is no history of flash pulmonary edema.   The patient denies a history of renal disease.   The patient is also seen for follow up evaluation of carotid stenosis. The carotid stenosis followed by ultrasound.   The patient denies amaurosis fugax. There is no recent history of TIA symptoms or focal motor deficits. There is no prior documented CVA.  The patient is taking enteric-coated aspirin 81 mg daily.  There is no history of migraine headaches. There is no history of seizures.  The patient denies claudication symptoms or rest pain symptoms. The patient denies history of DVT, PE or superficial thrombophlebitis. The patient denies recent episodes of angina or shortness of breath.    Duplex ultrasound of the renal arteries demonstrates <50% bilateral stenosis   Carotid Duplex done today shows ***.     No outpatient medications have been marked as taking for the 03/27/21 encounter (Appointment) with [name], Dolores Lory, MD.    Past Medical History:  Diagnosis Date   Anxiety    Breast cancer ([place])    Left breast(nipple mass) 2018   Cervical stenosis of spine    Chicken pox    Chronic back pain    Colon polyps    COPD (chronic obstructive  pulmonary disease) ([place])    Cyst of left nipple    Depression    Hypertension    Insomnia    Personal history of radiation therapy     Past Surgical History:  Procedure Laterality Date   BACK SURGERY     x 3 (2010 x 2; 2013 x 1)  Dr. Ronnald Ramp   BREAST BIOPSY Left    core- neg   BREAST LUMPECTOMY Left 2018   BREAST SURGERY Left    breast biopsy   CERVICAL CONE BIOPSY     CERVICAL FUSION     times 2   COLONOSCOPY WITH PROPOFOL N/A 04/18/2015   Procedure: COLONOSCOPY WITH PROPOFOL;  Surgeon: [name], MD;  Location: [facility] ENDOSCOPY;  Service: Endoscopy;  Laterality: N/A;   ELBOW SURGERY     2014 b/l elbows per pt ulnar nerve    ELBOW SURGERY     x2    EPIDURAL BLOCK INJECTION     Dr. Maryjean Ka    MASS EXCISION Left 04/12/2016   Procedure: EXCISION OF LEFT NIPPLE MASS;  Surgeon: Stark Klein, MD;  Location: [facility];  Service: General;  Laterality: Left;   NECK SURGERY     POSTERIOR CERVICAL FUSION/FORAMINOTOMY  03/07/2011   Procedure: POSTERIOR CERVICAL FUSION/FORAMINOTOMY LEVEL 4;  Surgeon: Eustace Moore, MD;  Location: [facility] NEURO ORS;  Service: Neurosurgery;  Laterality: N/A;  cervical three to thoracic one posterior cervical fusion    SENTINEL NODE BIOPSY Left 05/29/2016   Procedure:  LEFT SENTINEL LYMPH NODE BIOPSY;  Surgeon: Stark Klein, MD;  Location: [facility];  Service: General;  Laterality: Left;   TONSILLECTOMY      Social History Social History   Tobacco Use   Smoking status: Every Day    Packs/day: 0.25    Years: 30.00    Pack years: 7.50    Types: Cigarettes   Smokeless tobacco: Never   Tobacco comments:    work days - 6 cigarettes; weekends - ~10 - reported on 10/13/2020  Vaping Use   Vaping Use: Every day  Substance Use Topics   Alcohol use: Yes    Comment: weekends and some today   Drug use: No    Comment: on opana since jan 2018    Family History Family History  Problem Relation Age of Onset   Breast cancer Mother 80    Hypertension Mother    Cancer Mother        breast dx'ed 53    Cancer Father        prostate   Hypertension Father    Drug abuse Daughter    Hypertension Maternal Grandmother    Breast cancer Cousin     Allergies  Allergen Reactions   Anastrozole    Augmentin [Amoxicillin-Pot Clavulanate]     Diarrhea    Norvasc [Amlodipine]     Swelling    Topamax [Topiramate]     Didn't like the way made feel more irritatble/sad    Sulfa Antibiotics Itching and Rash   Sulfonamide Derivatives Itching and Rash     REVIEW OF SYSTEMS (Negative unless checked)  Constitutional: [] Weight loss  [] Fever  [] Chills Cardiac: [] Chest pain   [] Chest pressure   [] Palpitations   [] Shortness of breath when laying flat   [] Shortness of breath with exertion. Vascular:  [] Pain in legs with walking   [] Pain in legs at rest  [] History of DVT   [] Phlebitis   [] Swelling in legs   [] Varicose veins   [] Non-healing ulcers Pulmonary:   [] Uses home oxygen   [] Productive cough   [] Hemoptysis   [] Wheeze  [] COPD   [] Asthma Neurologic:  [] Dizziness   [] Seizures   [] History of stroke   [] History of TIA  [] Aphasia   [] Vissual changes   [] Weakness or numbness in arm   [] Weakness or numbness in leg Musculoskeletal:   [] Joint swelling   [] Joint pain   [] Low back pain Hematologic:  [] Easy bruising  [] Easy bleeding   [] Hypercoagulable state   [] Anemic Gastrointestinal:  [] Diarrhea   [] Vomiting  [] Gastroesophageal reflux/heartburn   [] Difficulty swallowing. Genitourinary:  [] Chronic kidney disease   [] Difficult urination  [] Frequent urination   [] Blood in urine Skin:  [] Rashes   [] Ulcers  Psychological:  [] History of anxiety   []  History of major depression.  Physical Examination  There were no vitals filed for this visit. There is no height or weight on file to calculate BMI. Gen: WD/WN, NAD Head: [place]/AT, No temporalis wasting.  Ear/Nose/Throat: Hearing grossly intact, nares w/o erythema or drainage Eyes: PER, EOMI, sclera  nonicteric.  Neck: Supple, no masses.  No bruit or JVD.  Pulmonary:  Good air movement, no audible wheezing, no use of accessory muscles.  Cardiac: RRR, normal S1, S2, no Murmurs. Vascular:  *** Vessel Right Left  Radial Palpable Palpable  Carotid Palpable Palpable  PT Palpable Palpable  DP Palpable Palpable  Gastrointestinal: soft, non-distended. No guarding/no peritoneal signs.  Musculoskeletal: M/S 5/5 throughout.  No visible deformity.  Neurologic: CN 2-12 intact. Pain  and light touch intact in extremities.  Symmetrical.  Speech is fluent. Motor exam as listed above. Psychiatric: Judgment intact, Mood & affect appropriate for pt's clinical situation. Dermatologic: No rashes or ulcers noted.  No changes consistent with cellulitis.   CBC Lab Results  Component Value Date   WBC 8.2 02/16/2021   HGB 11.9 (L) 02/16/2021   HCT 36.0 02/16/2021   MCV 93.0 02/16/2021   PLT 246 02/16/2021    BMET    Component Value Date/Time   NA 139 03/03/2021 1004   K 4.4 03/03/2021 1004   CL 102 03/03/2021 1004   CO2 30 03/03/2021 1004   GLUCOSE 109 (H) 03/03/2021 1004   BUN 9 03/03/2021 1004   CREATININE 0.79 03/03/2021 1004   CREATININE 0.73 08/05/2020 1656   CALCIUM 9.6 03/03/2021 1004   GFRNONAA >60 02/18/2021 0432   GFRNONAA >60 02/13/2017 0936   GFRAA >60 02/13/2017 0936   CrCl cannot be calculated (Patient's most recent lab result is older than the maximum 21 days allowed.).  COAG Lab Results  Component Value Date   INR 1.0 11/17/2020   INR 1.07 01/29/2016   INR 0.99 02/28/2011    Radiology US Venous Img Lower Bilateral  Result Date: 03/21/2021 CLINICAL DATA:  Bilateral leg edema EXAM: BILATERAL LOWER EXTREMITY VENOUS DOPPLER ULTRASOUND TECHNIQUE: Gray-scale sonography with compression, as well as color and duplex ultrasound, were performed to evaluate the deep venous system(s) from the level of the common femoral vein through the popliteal and proximal calf veins.  COMPARISON:  None. FINDINGS: VENOUS Normal compressibility of the bilateral common femoral, superficial femoral, and popliteal veins, as well as the visualized calf veins. Visualized portions of bilateral profunda femoral vein and great saphenous vein unremarkable. No filling defects to suggest DVT on grayscale or color Doppler imaging. Doppler waveforms show normal direction of venous flow, normal respiratory plasticity and response to augmentation. OTHER None. Limitations: none IMPRESSION: Negative. Electronically Signed   By: Merilyn Baba M.D.   On: 03/21/2021 15:48     Assessment/Plan There are no diagnoses linked to this encounter.   Hortencia Pilar, MD  03/26/2021 5:08 PM

## 2021-03-27 ENCOUNTER — Encounter (INDEPENDENT_AMBULATORY_CARE_PROVIDER_SITE_OTHER): Payer: PPO

## 2021-03-27 ENCOUNTER — Ambulatory Visit (INDEPENDENT_AMBULATORY_CARE_PROVIDER_SITE_OTHER): Payer: PPO | Admitting: Vascular Surgery

## 2021-03-27 DIAGNOSIS — I6523 Occlusion and stenosis of bilateral carotid arteries: Secondary | ICD-10-CM

## 2021-03-27 DIAGNOSIS — I10 Essential (primary) hypertension: Secondary | ICD-10-CM

## 2021-03-27 DIAGNOSIS — I701 Atherosclerosis of renal artery: Secondary | ICD-10-CM

## 2021-03-27 DIAGNOSIS — E785 Hyperlipidemia, unspecified: Secondary | ICD-10-CM

## 2021-03-27 DIAGNOSIS — J439 Emphysema, unspecified: Secondary | ICD-10-CM

## 2021-03-29 ENCOUNTER — Telehealth: Payer: Self-pay | Admitting: *Deleted

## 2021-03-29 ENCOUNTER — Ambulatory Visit: Payer: PPO | Admitting: *Deleted

## 2021-03-29 DIAGNOSIS — F3341 Major depressive disorder, recurrent, in partial remission: Secondary | ICD-10-CM

## 2021-03-29 DIAGNOSIS — F419 Anxiety disorder, unspecified: Secondary | ICD-10-CM

## 2021-03-29 DIAGNOSIS — F1010 Alcohol abuse, uncomplicated: Secondary | ICD-10-CM

## 2021-03-29 NOTE — Telephone Encounter (Addendum)
°  Care Management   Follow Up Note   03/29/2021 Name: Ashley Cross MRN: [id number] DOB: [birth year]   Referred by: McLean-Scocuzza, Nino Glow, MD Reason for referral : Care Coordination   Third unsuccessful telephone outreach was attempted today. The patient was referred to the case management team for assistance with care management and care coordination. The patient's primary care provider has been notified of our unsuccessful attempts to make or maintain contact with the patient. The care management team is pleased to engage with this patient at any time in the future should he/she be interested in assistance from the care management team.   Follow Up Plan: We have been unable to make contact with the patient for follow up. The care management team is available to follow up with the patient after provider conversation with the patient regarding recommendation for care management engagement and subsequent re-referral to the care management team.   [facility], [place] [phone]

## 2021-03-30 ENCOUNTER — Other Ambulatory Visit: Payer: Self-pay | Admitting: Internal Medicine

## 2021-03-30 DIAGNOSIS — R0981 Nasal congestion: Secondary | ICD-10-CM

## 2021-03-30 DIAGNOSIS — J309 Allergic rhinitis, unspecified: Secondary | ICD-10-CM

## 2021-03-30 DIAGNOSIS — R0982 Postnasal drip: Secondary | ICD-10-CM

## 2021-03-30 NOTE — Chronic Care Management (AMB) (Signed)
Chronic Care Management    Clinical Social Work Note  03/30/2021 Name: Ashley Cross MRN: [id number] DOB: 13-Feb-1955  Ashley Cross is a 66 y.o. year old female who is a primary care patient of McLean-Scocuzza, Nino Glow, MD. The CCM team was consulted to assist the patient with chronic disease management and/or care coordination needs related to: Mental Health Counseling and Resources.   Engaged with patient by telephone for follow up visit in response to provider referral for social work chronic care management and care coordination services.   Consent to Services:  The patient was given the following information about Chronic Care Management services today, agreed to services, and gave verbal consent: 1. CCM service includes personalized support from designated clinical staff supervised by the primary care provider, including individualized plan of care and coordination with other care providers 2. 24/7 contact phone numbers for assistance for urgent and routine care needs. 3. Service will only be billed when office clinical staff spend 20 minutes or more in a month to coordinate care. 4. Only one practitioner may furnish and bill the service in a calendar month. 5.The patient may stop CCM services at any time (effective at the end of the month) by phone call to the office staff. 6. The patient will be responsible for cost sharing (co-pay) of up to 20% of the service fee (after annual deductible is met). Patient agreed to services and consent obtained.  Patient agreed to services and consent obtained.   Assessment: Review of patient past medical history, allergies, medications, and health status, including review of relevant consultants reports was performed today as part of a comprehensive evaluation and provision of chronic care management and care coordination services.     SDOH (Social Determinants of Health) assessments and interventions performed:    Advanced Directives Status:  Not addressed in this encounter.  CCM Care Plan  Allergies  Allergen Reactions   Anastrozole    Augmentin [Amoxicillin-Pot Clavulanate]     Diarrhea    Norvasc [Amlodipine]     Swelling    Topamax [Topiramate]     Didn't like the way made feel more irritatble/sad    Sulfa Antibiotics Itching and Rash   Sulfonamide Derivatives Itching and Rash    Outpatient Encounter Medications as of 03/29/2021  Medication Sig Note   albuterol (VENTOLIN HFA) 108 (90 Base) MCG/ACT inhaler Inhale 1-2 puffs into the lungs every 4 (four) hours as needed for wheezing or shortness of breath.    buPROPion (WELLBUTRIN XL) 150 MG 24 hr tablet Take 1 tablet (150 mg total) by mouth daily.    cyclobenzaprine (FLEXERIL) 10 MG tablet Take 10 mg by mouth every 8 (eight) hours as needed. 04/19/2020: Taking 1-2 times daily   DULoxetine (CYMBALTA) 60 MG capsule Take 1 capsule (60 mg total) by mouth daily.    feeding supplement (ENSURE ENLIVE / ENSURE PLUS) LIQD Take 237 mLs by mouth 2 (two) times daily between meals.    fluticasone (FLONASE) 50 MCG/ACT nasal spray TAKE 2 SPRAYS INTO BOTH NOSTRILS DAILY    furosemide (LASIX) 40 MG tablet Take 1 tablet (40 mg total) by mouth daily as needed. For edema    gabapentin (NEURONTIN) 600 MG tablet Take 1,200 mg by mouth 3 (three) times daily.    hydrOXYzine (VISTARIL) 25 MG capsule TAKE ONE CAPSULE EVERY EIGHT HOURS AS NEEDED FOR ANXIETY 02/22/2021: QPM   ipratropium (ATROVENT) 0.06 % nasal spray PLACE 2 SPRAYS INTO BOTH NOSTRILS 3 TIMES DAILY AS NEEDED  lamoTRIgine (LAMICTAL) 200 MG tablet Take 1 tablet (200 mg total) by mouth daily.    letrozole (FEMARA) 2.5 MG tablet Take 1 tablet (2.5 mg total) by mouth daily.    montelukast (SINGULAIR) 10 MG tablet Take 1 tablet (10 mg total) by mouth at bedtime.    morphine (MSIR) 15 MG tablet Take 1 tablet by mouth every 4 (four) hours as needed. Every 4-6 hours prn 02/22/2021: 1 QAM   Multiple Vitamins-Minerals (MULTIVITAMIN) tablet  Take 1 tablet by mouth daily.    nebivolol (BYSTOLIC) 5 MG tablet Take 1 tablet (5 mg total) by mouth daily.    oxymorphone (OPANA) 10 MG tablet Take 10 mg by mouth 2 (two) times daily. (Patient not taking: Reported on 03/03/2021)    SENEXON-S 8.6-50 MG tablet TAKE 1-2 TABLETS BY MOUTH DAILY AS NEEDED FOR MILD CONSTIPATION.    telmisartan (MICARDIS) 80 MG tablet Take 1 tablet (80 mg total) by mouth daily.    Tiotropium Bromide-Olodaterol (STIOLTO RESPIMAT) 2.5-2.5 MCG/ACT AERS Inhale 2 puffs into the lungs daily.    triamcinolone cream (KENALOG) 0.1 % Apply 1 application topically 2 (two) times daily. Prn legs itching    No facility-administered encounter medications on file as of 03/29/2021.    Patient Active Problem List   Diagnosis Date Noted   Carotid stenosis 03/26/2021   Acute renal failure due to traumatic rhabdomyolysis ([place]) 02/17/2021   Spinal stenosis of lumbar region 12/20/2020   H/O cervical spine surgery 12/20/2020   Abnormal MRI, cervical spine 12/20/2020   Abnormal MRI, lumbar spine 12/20/2020   Arthritis of neck 12/20/2020   Arthritis, lumbar spine 12/20/2020   Abnormal gait 12/20/2020   Frequent falls 12/20/2020   Cervical radiculopathy 12/20/2020   Numbness of fingers of both hands 12/20/2020   Carotid bruit present 10/06/2020   Noncompliance with treatment plan 09/16/2020   Polyp of colon 09/04/2020   Intrahepatic bile duct dilation 09/04/2020   Adrenal adenoma, left 09/04/2020   Atherosclerosis of renal artery ([place]) 09/04/2020   Elevated brain natriuretic peptide (BNP) level 08/09/2020   MDD (major depressive disorder), recurrent, in full remission ([place]) 07/28/2020   Thoracic spine pain 07/28/2020   MDD (major depressive disorder), recurrent, in partial remission ([place]) 06/02/2020   Cervical spondylosis 05/24/2020   Aortic atherosclerosis ([place]) 05/02/2020   Tubular adenoma of colon 04/28/2020   High risk medication use 09/15/2019   At risk for long QT syndrome  01/13/2019   Chronic left shoulder pain 12/31/2018   Cervical postlaminectomy syndrome 10/20/2018   Alcohol use disorder, mild, abuse 09/16/2018   Pulmonary emphysema ([place]) 01/22/2018   HTN (hypertension) 05/06/2017   HLD (hyperlipidemia) 05/02/2017   Osteopenia 05/01/2017   Vitamin D deficiency 01/27/2017   Chronic pain syndrome 01/27/2017   Chronic insomnia 01/27/2017   Tobacco use disorder 01/27/2017   Malignant neoplasm of lower-outer quadrant of left breast of female, estrogen receptor positive ([place]) 04/20/2016   Alcohol use 02/07/2016   Cervical post-laminectomy syndrome 09/06/2015   Lumbar radiculopathy 10/31/2012   Neuropathy 12/31/2006    Conditions to be addressed/monitored: Depression; Mental Health Concerns   Care Plan : Medication Management  Updates made by Vern Claude, LCSW since 03/30/2021 12:00 AM     Problem: CHL AMB "PATIENT-SPECIFIC PROBLEM"   Note:   CARE PLAN ENTRY (see longitudinal plan of care for additional care plan information)  Current Barriers:  Knowledge deficits related to accessing mental health provider in patient with Depression  Patient has a history of experiencing symptoms  of  depression which seem to be exacerbated by the loss of her daughter.     Patient needs Support, Education, and Care Coordination in order to meet unmet mental health needs  Mental Health Concerns   Clinical Social Work Goal(s):  Over the next 30 days, patient will work with SW monthly by telephone or in person to ensure management of  symptoms of depression until connected for ongoing counseling resources of needed  Patient will implement clinical interventions discussed today to decreases symptoms of depression and increase knowledge and/or ability of: self-management skills. Interventions:  Assessed patient's understanding, education, previous treatment and care coordination needs  Patient interviewed and appropriate assessments performed: PHQ 2 Provided basic  mental health support, education and interventions  Patient discussed improvement in mood since she began providing in home assistance to a friend of her - helping her to feel more motivated to move forward Coping strategies for feeling overwhelmed discussed including the breaking down of large tasks to smaller more manageable ones Followed up on participation in  long term counseling-Patient states that she is no longer seeing a therapist or psychiatrist due to improvement in mood Patient denies drinking excessively, declined need for resources in this area Patient did discuss loss of her daughter, grief response improving Collaborated with appropriate clinical care team members regarding patient needs via chart Reviewed mental health medications with patient prescribed by PCP and discussed compliance Other interventions include: Depression screen reviewed  Active listening / Reflection utilized  Emotional Support Provided Participation in counseling encouraged    Patient Self Care Activities & Deficits:  Patient is unable to independently navigate community resource options without care coordination support Patient is able to implement clinical interventions discussed today and is motivated for treatment  Patient will select one of the agencies from the list provided and call to schedule an appointment  Performs ADL's independently Performs IADL's independently [facility]  Initial goal documentation       Follow Up Plan: SW will follow up with patient by phone over the next 30 business days      [employer], [place] [phone]

## 2021-03-30 NOTE — Patient Instructions (Addendum)
Visit Information  Thank you for taking time to visit with me today. Please don't hesitate to contact me if I can be of assistance to you before our next scheduled telephone appointment.  Following are the goals we discussed today:  (Copy and paste patient goals from clinical care plan here)  Our next appointment is by telephone on 04/26/21 at 4pm  Please call the care guide team at [phone] if you need to cancel or reschedule your appointment.   If you are experiencing a Mental Health or [place] or need someone to talk to, please call the Suicide and Crisis Lifeline: 988   Following is a copy of your full plan of care:  Care Plan : Medication Management  Updates made by Vern Claude, LCSW since 03/30/2021 12:00 AM     Problem: CHL AMB "PATIENT-SPECIFIC PROBLEM"   Note:   CARE PLAN ENTRY (see longitudinal plan of care for additional care plan information)  Current Barriers:  Knowledge deficits related to accessing mental health provider in patient with Depression  Patient has a history of experiencing symptoms of  depression which seem to be exacerbated by the loss of her daughter.     Patient needs Support, Education, and Care Coordination in order to meet unmet mental health needs  Mental Health Concerns   Clinical Social Work Goal(s):  Over the next 30 days, patient will work with SW monthly by telephone or in person to ensure management of  symptoms of depression until connected for ongoing counseling resources of needed  Patient will implement clinical interventions discussed today to decreases symptoms of depression and increase knowledge and/or ability of: self-management skills. Interventions:  Assessed patient's understanding, education, previous treatment and care coordination needs  Patient interviewed and appropriate assessments performed: PHQ 2 Provided basic mental health support, education and interventions  Patient discussed improvement in  mood since she began providing in home assistance to a friend of her - helping her to feel more motivated to move forward Coping strategies for feeling overwhelmed discussed including the breaking down of large tasks to smaller more manageable ones Followed up on participation in  long term counseling-Patient states that she is no longer seeing a therapist or psychiatrist due to improvement in mood Patient denies drinking excessively, declined need for resources in this area Patient did discuss loss of her daughter, grief response improving Collaborated with appropriate clinical care team members regarding patient needs evidenced by provider attestation and co-signature. Reviewed mental health medications with patient prescribed by PCP and discussed compliance Other interventions include: Depression screen reviewed  Active listening / Reflection utilized  Emotional Support Provided Participation in counseling encouraged    Patient Self Care Activities & Deficits:  Patient is unable to independently navigate community resource options without care coordination support Patient is able to implement clinical interventions discussed today and is motivated for treatment  Patient will select one of the agencies from the list provided and call to schedule an appointment  Performs ADL's independently Performs IADL's independently [facility]  Initial goal documentation      Ms. Bhargava was given information about Care Management services by the embedded care coordination team including:  Care Management services include personalized support from designated clinical staff supervised by her physician, including individualized plan of care and coordination with other care providers 24/7 contact phone numbers for assistance for urgent and routine care needs. The patient may stop CCM services at any time (effective at the end of  the month) by phone call to the office  staff.  Patient agreed to services and verbal consent obtained.   Patient verbalizes understanding of instructions and care plan provided today and agrees to view in [place]. Active MyChart status confirmed with patient.    Telephone follow up appointment with care management team member scheduled for: 04/26/21  Elliot Gurney, [place] [phone]

## 2021-04-04 ENCOUNTER — Other Ambulatory Visit: Payer: Self-pay

## 2021-04-04 ENCOUNTER — Ambulatory Visit (INDEPENDENT_AMBULATORY_CARE_PROVIDER_SITE_OTHER): Payer: PPO | Admitting: Internal Medicine

## 2021-04-04 ENCOUNTER — Encounter: Payer: Self-pay | Admitting: Internal Medicine

## 2021-04-04 VITALS — BP 124/66 | HR 88 | Temp 98.5°F | Ht 64.0 in | Wt 130.0 lb

## 2021-04-04 DIAGNOSIS — Z1211 Encounter for screening for malignant neoplasm of colon: Secondary | ICD-10-CM | POA: Diagnosis not present

## 2021-04-04 DIAGNOSIS — F3341 Major depressive disorder, recurrent, in partial remission: Secondary | ICD-10-CM

## 2021-04-04 DIAGNOSIS — G894 Chronic pain syndrome: Secondary | ICD-10-CM

## 2021-04-04 DIAGNOSIS — I10 Essential (primary) hypertension: Secondary | ICD-10-CM

## 2021-04-04 DIAGNOSIS — K5900 Constipation, unspecified: Secondary | ICD-10-CM | POA: Diagnosis not present

## 2021-04-04 DIAGNOSIS — J449 Chronic obstructive pulmonary disease, unspecified: Secondary | ICD-10-CM

## 2021-04-04 DIAGNOSIS — K635 Polyp of colon: Secondary | ICD-10-CM | POA: Diagnosis not present

## 2021-04-04 MED ORDER — POLYETHYLENE GLYCOL 3350 17 G PO PACK: 17.0000 g | PACK | Freq: Every day | ORAL | 11 refills | Status: DC | PRN

## 2021-04-04 NOTE — Progress Notes (Signed)
Chief Complaint  Patient presents with   Results   F/u  1. 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 Low phos given list of foods high in phos and also disc etoh can cause this  3. Chronic pain neck shoulder back hands and arms pain 7/10 opana pharmacy having trouble getting so on morphine IR now Q4 hours prn per pain clinic disc with pt to disc MS contin 12 hour morphine instead and she is having constipation on narcotics disc senna-S taking up to 3 pills qd or can do miralax 1-2 caps qd + colace otc   4.h/o colon polyp due to f/[facility] repeat colonoscopy  5. Htn improved on bystolic 5 mg telmisartan 80 m gand lasix 40 mg qd    Review of Systems  Constitutional:  Negative for weight loss.  HENT:  Negative for hearing loss.   Eyes:  Negative for blurred vision.  Respiratory:  Negative for shortness of breath.   Cardiovascular:  Negative for chest pain.  Gastrointestinal:  Negative for abdominal pain and blood in stool.  Musculoskeletal:  Negative for back pain.  Skin:  Negative for rash.  Neurological:  Negative for headaches.  Psychiatric/Behavioral:  Negative for depression.   Past Medical History:  Diagnosis Date   Anxiety    Breast cancer ([place])    Left breast(nipple mass) 2018   Cervical stenosis of spine    Chicken pox    Chronic back pain    Colon polyps    COPD (chronic obstructive pulmonary disease) (HCC)    Cyst of left nipple    Depression    Hypertension    Insomnia    Personal history of radiation therapy    Past Surgical History:  Procedure Laterality Date   BACK SURGERY     x 3 (2010 x 2; 2013 x 1)  Dr. Ronnald Ramp   BREAST BIOPSY Left    core- neg   BREAST LUMPECTOMY Left 2018   BREAST SURGERY Left    breast biopsy   CERVICAL CONE BIOPSY     CERVICAL FUSION     times 2   COLONOSCOPY WITH PROPOFOL N/A 04/18/2015   Procedure: COLONOSCOPY WITH PROPOFOL;  Surgeon:  [name], MD;  [facility];  Service: Endoscopy;  Laterality: N/A;   ELBOW SURGERY     2014 b/l elbows per pt ulnar nerve    ELBOW SURGERY     x2    EPIDURAL BLOCK INJECTION     Dr. Maryjean Ka    MASS EXCISION Left 04/12/2016   Procedure: EXCISION OF LEFT NIPPLE MASS;  Surgeon: Stark Klein, MD;  Location: [place];  Service: General;  Laterality: Left;   NECK SURGERY     POSTERIOR CERVICAL FUSION/FORAMINOTOMY  03/07/2011   Procedure: POSTERIOR CERVICAL FUSION/FORAMINOTOMY LEVEL 4;  Surgeon: Eustace Moore, MD;  Location: [place] [facility];  Service: Neurosurgery;  Laterality: N/A;  cervical three to thoracic one posterior cervical fusion    SENTINEL NODE BIOPSY Left 05/29/2016   Procedure: LEFT SENTINEL LYMPH NODE BIOPSY;  Surgeon: Stark Klein, MD;  Location[facility];  Service: General;  Laterality: Left;   TONSILLECTOMY     Family History  Problem Relation Age of Onset   Breast cancer Mother 17   Hypertension Mother    Cancer Mother        breast dx'ed 6    Cancer Father  prostate   Hypertension Father    Drug abuse Daughter    Hypertension Maternal Grandmother    Breast cancer Cousin    Social History   Socioeconomic History   Marital status: Divorced    Spouse name: Not on file   Number of children: 2   Years of education: Not on file   Highest education level: Not on file  Occupational History   Not on file  Tobacco Use   Smoking status: Every Day    Packs/day: 0.25    Years: 30.00    Pack years: 7.50    Types: Cigarettes   Smokeless tobacco: Never   Tobacco comments:    work days - 6 cigarettes; weekends - ~10 - reported on 10/13/2020  Vaping Use   Vaping Use: Every day  Substance and Sexual Activity   Alcohol use: Yes    Comment: weekends and some today   Drug use: No    Comment: on opana since jan 2018   Sexual activity: Not on file  Other Topics Concern   Not on file  Social History Narrative   As of 01/25/17 not  sexually active of in relationship    Divorced   2 kids son and daughter (though daughter died of suicide in May 12, 2017)   Son lives in [place] now as of 12/2018       Disability    Loves animals has 3 dogs           Social Determinants of Radio broadcast assistant Strain: Low Risk    Difficulty of Paying Living Expenses: Not very hard  Food Insecurity: No Food Insecurity   Worried About Charity fundraiser in the Last Year: Never true   Arboriculturist in the Last Year: Never true  Transportation Needs: No Transportation Needs   Lack of Transportation (Medical): No   Lack of Transportation (Non-Medical): No  Physical Activity: Inactive   Days of Exercise per Week: 0 days   Minutes of Exercise per Session: 0 min  Stress: Stress Concern Present   Feeling of Stress : Rather much  Social Connections: Socially Isolated   Frequency of Communication with Friends and Family: More than three times a week   Frequency of Social Gatherings with Friends and Family: Three times a week   Attends Religious Services: Never   Active Member of Clubs or Organizations: No   Attends Archivist Meetings: Never   Marital Status: Divorced  Human resources officer Violence: Not At Risk   Fear of Current or Ex-Partner: No   Emotionally Abused: No   Physically Abused: No   Sexually Abused: No   Current Meds  Medication Sig   albuterol (VENTOLIN HFA) 108 (90 Base) MCG/ACT inhaler Inhale 1-2 puffs into the lungs every 4 (four) hours as needed for wheezing or shortness of breath.   buPROPion (WELLBUTRIN XL) 150 MG 24 hr tablet Take 1 tablet (150 mg total) by mouth daily.   Cholecalciferol (VITAMIN D3 PO) Take by mouth.   Cyanocobalamin (VITAMIN B-12 PO) Take by mouth.   DULoxetine (CYMBALTA) 60 MG capsule Take 1 capsule (60 mg total) by mouth daily.   feeding supplement (ENSURE ENLIVE / ENSURE PLUS) LIQD Take 237 mLs by mouth 2 (two) times daily between meals.   fluticasone (FLONASE) 50 MCG/ACT  nasal spray USE TWO SPRAYS IN EACH NOSTRIL DAILY   furosemide (LASIX) 40 MG tablet Take 1 tablet (40 mg total) by mouth daily as needed.  For edema   gabapentin (NEURONTIN) 600 MG tablet Take 1,200 mg by mouth 3 (three) times daily.   hydrOXYzine (VISTARIL) 25 MG capsule TAKE ONE CAPSULE EVERY EIGHT HOURS AS NEEDED FOR ANXIETY   ipratropium (ATROVENT) 0.06 % nasal spray PLACE 2 SPRAYS INTO BOTH NOSTRILS 3 TIMES DAILY AS NEEDED   lamoTRIgine (LAMICTAL) 200 MG tablet Take 1 tablet (200 mg total) by mouth daily.   letrozole (FEMARA) 2.5 MG tablet Take 1 tablet (2.5 mg total) by mouth daily.   montelukast (SINGULAIR) 10 MG tablet TAKE ONE TABLET BY MOUTH AT BEDTIME   morphine (MSIR) 15 MG tablet Take 1 tablet by mouth every 4 (four) hours as needed. Every 4-6 hours prn   nebivolol (BYSTOLIC) 5 MG tablet Take 1 tablet (5 mg total) by mouth daily.   polyethylene glycol (MIRALAX) 17 g packet Take 17-34 g by mouth daily as needed.   Pyridoxine HCl (VITAMIN B-6 PO) Take by mouth.   SENEXON-S 8.6-50 MG tablet TAKE 1-2 TABLETS BY MOUTH DAILY AS NEEDED FOR MILD CONSTIPATION.   telmisartan (MICARDIS) 80 MG tablet Take 1 tablet (80 mg total) by mouth daily.   triamcinolone cream (KENALOG) 0.1 % Apply 1 application topically 2 (two) times daily. Prn legs itching   Allergies  Allergen Reactions   Anastrozole    Augmentin [Amoxicillin-Pot Clavulanate]     Diarrhea    Norvasc [Amlodipine]     Swelling    Topamax [Topiramate]     Didn't like the way made feel more irritatble/sad    Sulfa Antibiotics Itching and Rash   Sulfonamide Derivatives Itching and Rash   Recent Results (from the past 2160 hour(s))  Comprehensive metabolic panel     Status: Abnormal   Collection Time: 02/16/21  9:00 PM  Result Value Ref Range   Sodium 134 (L) 135 - 145 mmol/L   Potassium 4.9 3.5 - 5.1 mmol/L   Chloride 97 (L) 98 - 111 mmol/L   CO2 22 22 - 32 mmol/L   Glucose, Bld 122 (H) 70 - 99 mg/dL    Comment: Glucose  reference range applies only to samples taken after fasting for at least 8 hours.   BUN 70 (H) 8 - 23 mg/dL   Creatinine, Ser 1.34 (H) 0.44 - 1.00 mg/dL   Calcium 9.1 8.9 - 10.3 mg/dL   Total Protein 7.7 6.5 - 8.1 g/dL   Albumin 4.1 3.5 - 5.0 g/dL   AST 415 (H) 15 - 41 U/L   ALT 204 (H) 0 - 44 U/L   Alkaline Phosphatase 111 38 - 126 U/L   Total Bilirubin 2.0 (H) 0.3 - 1.2 mg/dL   GFR, Estimated 44 (L) >60 mL/min    Comment: (NOTE) Calculated using the CKD-EPI Creatinine Equation (2021)    Anion gap 15 5 - 15    Comment: Performed at [facility], [address] [place], [place] [address]  Ethanol     Status: None   Collection Time: 02/16/21  9:00 PM  Result Value Ref Range   Alcohol, Ethyl (B) <10 <10 mg/dL    Comment: (NOTE) Lowest detectable limit for serum alcohol is 10 mg/dL.  For medical purposes only. Performed at [facility], [address] [place], [place] [address]   Salicylate level     Status: Abnormal   Collection Time: 02/16/21  9:00 PM  Result Value Ref Range   Salicylate Lvl <5.8 (L) 7.0 - 30.0 mg/dL    Comment: Performed at [facility], [place], [place] [address]  Acetaminophen level     Status: Abnormal   Collection Time: 02/16/21  9:00 PM  Result Value Ref Range   Acetaminophen (Tylenol), Serum <10 (L) 10 - 30 ug/mL    Comment: (NOTE) Therapeutic concentrations vary significantly. A range of 10-30 ug/mL  may be an effective concentration for many patients. However, some  are best treated at concentrations outside of this range. Acetaminophen concentrations >150 ug/mL at 4 hours after ingestion  and >50 ug/mL at 12 hours after ingestion are often associated with  toxic reactions.  Performed at [facility], [address] [place], [place] [address]   cbc     Status: Abnormal   Collection Time: 02/16/21  9:00 PM  Result Value Ref Range   WBC 8.2 4.0 - 10.5 K/uL   RBC 3.87 3.87 - 5.11 MIL/uL    Hemoglobin 11.9 (L) 12.0 - 15.0 g/dL   HCT 36.0 36.0 - 46.0 %   MCV 93.0 80.0 - 100.0 fL   MCH 30.7 26.0 - 34.0 pg   MCHC 33.1 30.0 - 36.0 g/dL   RDW 14.6 11.5 - 15.5 %   Platelets 246 150 - 400 K/uL   nRBC 0.2 0.0 - 0.2 %    Comment: Performed at [facility], [address] [place], [place] [address]  Urine Drug Screen, Qualitative     Status: Abnormal   Collection Time: 02/16/21  9:00 PM  Result Value Ref Range   Tricyclic, Ur Screen POSITIVE (A) NONE DETECTED   Amphetamines, Ur Screen NONE DETECTED NONE DETECTED   MDMA (Ecstasy)Ur Screen NONE DETECTED NONE DETECTED   Cocaine Metabolite,Ur [place] NONE DETECTED NONE DETECTED   Opiate, Ur Screen POSITIVE (A) NONE DETECTED   Phencyclidine (PCP) Ur S NONE DETECTED NONE DETECTED   Cannabinoid 50 Ng, Ur [place] NONE DETECTED NONE DETECTED   Barbiturates, Ur Screen NONE DETECTED NONE DETECTED   Benzodiazepine, Ur Scrn NONE DETECTED NONE DETECTED   Methadone Scn, Ur NONE DETECTED NONE DETECTED    Comment: (NOTE) Tricyclics + metabolites, urine    Cutoff 1000 ng/mL Amphetamines + metabolites, urine  Cutoff 1000 ng/mL MDMA (Ecstasy), urine              Cutoff 500 ng/mL Cocaine Metabolite, urine          Cutoff 300 ng/mL Opiate + metabolites, urine        Cutoff 300 ng/mL Phencyclidine (PCP), urine         Cutoff 25 ng/mL Cannabinoid, urine                 Cutoff 50 ng/mL Barbiturates + metabolites, urine  Cutoff 200 ng/mL Benzodiazepine, urine              Cutoff 200 ng/mL Methadone, urine                   Cutoff 300 ng/mL  The urine drug screen provides only a preliminary, unconfirmed analytical test result and should not be used for non-medical purposes. Clinical consideration and professional judgment should be applied to any positive drug screen result due to possible interfering substances. A more specific alternate chemical method must be used in order to obtain a confirmed analytical result. Gas chromatography / mass  spectrometry (GC/MS) is the preferred confirm atory method. Performed at [facility], [address] [place], [place] [address]   CK     Status: Abnormal   Collection  Time: 02/16/21  9:00 PM  Result Value Ref Range   Total CK 10,744 (H) 38 - 234 U/L    Comment: RESULT CONFIRMED BY MANUAL DILUTION RH Performed at [facility], [address] [place], [place] [address]   Urinalysis, Complete w Microscopic     Status: Abnormal   Collection Time: 02/16/21  9:00 PM  Result Value Ref Range   Color, Urine YELLOW (A) YELLOW   APPearance HAZY (A) CLEAR   Specific Gravity, Urine 1.015 1.005 - 1.030   pH 5.0 5.0 - 8.0   Glucose, UA NEGATIVE NEGATIVE mg/dL   Hgb urine dipstick MODERATE (A) NEGATIVE   Bilirubin Urine NEGATIVE NEGATIVE   Ketones, ur 20 (A) NEGATIVE mg/dL   Protein, ur 30 (A) NEGATIVE mg/dL   Nitrite NEGATIVE NEGATIVE   Leukocytes,Ua NEGATIVE NEGATIVE   RBC / HPF 0-5 0 - 5 RBC/hpf   WBC, UA 0-5 0 - 5 WBC/hpf   Bacteria, UA RARE (A) NONE SEEN   Squamous Epithelial / LPF 0-5 0 - 5   Mucus PRESENT    Budding Yeast PRESENT    Hyaline Casts, UA PRESENT     Comment: Performed at [facility], [address] [place], [place] [address]  TSH     Status: None   Collection Time: 02/16/21  9:00 PM  Result Value Ref Range   TSH 0.618 0.350 - 4.500 uIU/mL    Comment: Performed by a 3rd Generation assay with a functional sensitivity of <=0.01 uIU/mL. Performed at [facility], [address] [place], [place] [address]   Resp Panel by RT-PCR (Flu A&B, Covid) Nasopharyngeal Swab     Status: None   Collection Time: 02/16/21  9:01 PM   Specimen: Nasopharyngeal Swab; Nasopharyngeal(NP) swabs in vial transport medium  Result Value Ref Range   SARS Coronavirus 2 by RT PCR NEGATIVE NEGATIVE    Comment: (NOTE) SARS-CoV-2 target nucleic acids are NOT DETECTED.  The SARS-CoV-2 RNA is generally detectable in upper respiratory specimens during the  acute phase of infection. The lowest concentration of SARS-CoV-2 viral copies this assay can detect is 138 copies/mL. A negative result does not preclude SARS-Cov-2 infection and should not be used as the sole basis for treatment or other patient management decisions. A negative result may occur with  improper specimen collection/handling, submission of specimen other than nasopharyngeal swab, presence of viral mutation(s) within the areas targeted by this assay, and inadequate number of viral copies(<138 copies/mL). A negative result must be combined with clinical observations, patient history, and epidemiological information. The expected result is Negative.  Fact Sheet for Patients:  EntrepreneurPulse.com.au  Fact Sheet for Healthcare Providers:  IncredibleEmployment.be  This test is no t yet approved or cleared by the [place] FDA and  has been authorized for detection and/or diagnosis of SARS-CoV-2 by FDA under an Emergency Use Authorization (EUA). This EUA will remain  in effect (meaning this test can be used) for the duration of the COVID-19 declaration under Section 564(b)(1) of the Act, 21 U.S.C.section 360bbb-3(b)(1), unless the authorization is terminated  or revoked sooner.       Influenza A by PCR NEGATIVE NEGATIVE   Influenza B by PCR NEGATIVE NEGATIVE    Comment: (NOTE) The Xpert Xpress SARS-CoV-2/FLU/RSV plus assay is intended as an aid in the diagnosis of influenza from Nasopharyngeal swab specimens and should not be used as a sole basis for treatment. Nasal washings and aspirates are unacceptable for Xpert Xpress SARS-CoV-2/FLU/RSV testing.  Fact Sheet for Patients: EntrepreneurPulse.com.au  Fact Sheet for Healthcare Providers: IncredibleEmployment.be  This test is not yet approved or cleared by the [place] FDA and has been authorized for detection and/or diagnosis of  SARS-CoV-2 by FDA under an Emergency Use Authorization (EUA). This EUA will remain in effect (meaning this test can be used) for the duration of the COVID-19 declaration under Section 564(b)(1) of the Act, 21 U.S.C. section 360bbb-3(b)(1), unless the authorization is terminated or revoked.  Performed at [facility], [address] [place], [place] [address]   Hepatitis panel, acute     Status: None   Collection Time: 02/17/21 12:01 AM  Result Value Ref Range   Hepatitis B Surface Ag NON REACTIVE NON REACTIVE   HCV Ab NON REACTIVE NON REACTIVE    Comment: (NOTE) Nonreactive HCV antibody screen is consistent with no HCV infections,  unless recent infection is suspected or other evidence exists to indicate HCV infection.     Hep A IgM NON REACTIVE NON REACTIVE   Hep B C IgM NON REACTIVE NON REACTIVE    Comment: Performed at [facility], [address] [place] [address], [place], [place] [address]  Ammonia     Status: None   Collection Time: 02/17/21 12:14 AM  Result Value Ref Range   Ammonia 32 9 - 35 umol/L    Comment: Performed at [facility], [place], [place] [address]  HIV Antibody (routine testing w rflx)     Status: None   Collection Time: 02/17/21  5:49 AM  Result Value Ref Range   HIV Screen 4th Generation wRfx Non Reactive Non Reactive    Comment: Performed at [facility], [place] [address], [place], [place] [address]  CK     Status: Abnormal   Collection Time: 02/17/21  5:49 AM  Result Value Ref Range   Total CK 5,705 (H) 38 - 234 U/L    Comment: RESULT CONFIRMED BY MANUAL DILUTION RH/SCS Performed at [facility], [address] [place], [place] [address]   Magnesium     Status: Abnormal   Collection Time: 02/17/21  5:49 AM  Result Value Ref Range   Magnesium 2.6 (H) 1.7 - 2.4 mg/dL    Comment: Performed at [facility], [address] [place], [place] [address]  Phosphorus     Status: Abnormal   Collection  Time: 02/17/21  5:49 AM  Result Value Ref Range   Phosphorus 1.4 (L) 2.5 - 4.6 mg/dL    Comment: Performed at [facility], [address] [place], [place],  [address]  Comprehensive metabolic panel     Status: Abnormal   Collection Time: 02/18/21  4:32 AM  Result Value Ref Range   Sodium 137 135 - 145 mmol/L   Potassium 3.6 3.5 - 5.1 mmol/L   Chloride 106 98 - 111 mmol/L   CO2 26 22 - 32 mmol/L   Glucose, Bld 106 (H) 70 - 99 mg/dL    Comment: Glucose reference range applies only to samples taken after fasting for at least 8 hours.   BUN 15 8 - 23 mg/dL   Creatinine, Ser 0.56 0.44 - 1.00 mg/dL   Calcium 8.8 (L) 8.9 - 10.3 mg/dL   Total Protein 5.9 (L) 6.5 - 8.1 g/dL   Albumin 3.0 (L) 3.5 - 5.0 g/dL   AST 169 (H) 15 - 41 U/L   ALT 137 (H) 0 - 44 U/L   Alkaline Phosphatase 91 38 -  126 U/L   Total Bilirubin 1.2 0.3 - 1.2 mg/dL   GFR, Estimated >60 >60 mL/min    Comment: (NOTE) Calculated using the CKD-EPI Creatinine Equation (2021)    Anion gap 5 5 - 15    Comment: Performed at [facility], [address] [place], [place] [address]  Phosphorus     Status: None   Collection Time: 02/18/21  5:14 PM  Result Value Ref Range   Phosphorus 3.1 2.5 - 4.6 mg/dL    Comment: Performed at [facility], [address] [place],  [address]  CK (Creatine Kinase)     Status: None   Collection Time: 03/03/21 10:04 AM  Result Value Ref Range   Total CK 59 7 - 177 U/L  Comprehensive metabolic panel     Status: Abnormal   Collection Time: 03/03/21 10:04 AM  Result Value Ref Range   Sodium 139 135 - 145 mEq/L   Potassium 4.4 3.5 - 5.1 mEq/L   Chloride 102 96 - 112 mEq/L   CO2 30 19 - 32 mEq/L   Glucose, Bld 109 (H) 70 - 99 mg/dL   BUN 9 6 - 23 mg/dL   Creatinine, Ser 0.79 0.40 - 1.20 mg/dL   Total Bilirubin 0.4 0.2 - 1.2 mg/dL   Alkaline Phosphatase 98 39 - 117 U/L   AST 17 0 - 37 U/L   ALT 18 0 - 35 U/L   Total Protein 7.0 6.0 - 8.3 g/dL   Albumin  3.9 3.5 - 5.2 g/dL   GFR 78.88 >60.00 mL/min    Comment: Calculated using the CKD-EPI Creatinine Equation (2021)   Calcium 9.6 8.4 - 10.5 mg/dL  Phosphorus     Status: None   Collection Time: 03/03/21 10:04 AM  Result Value Ref Range   Phosphorus 3.5 2.3 - 4.6 mg/dL  Magnesium     Status: None   Collection Time: 03/03/21 10:04 AM  Result Value Ref Range   Magnesium 1.9 1.5 - 2.5 mg/dL  Vitamin D (25 hydroxy)     Status: Abnormal   Collection Time: 03/03/21 10:04 AM  Result Value Ref Range   VITD 25.38 (L) 30.00 - 100.00 ng/mL   Objective  Body mass index is 22.31 kg/m. Wt Readings from Last 3 Encounters:  04/04/21 130 lb (59 kg)  03/03/21 131 lb 3.2 oz (59.5 kg)  02/28/21 136 lb (61.7 kg)   Temp Readings from Last 3 Encounters:  04/04/21 98.5 F (36.9 C) (Oral)  03/03/21 98.5 F (36.9 C) (Oral)  02/18/21 98.2 F (36.8 C)   BP Readings from Last 3 Encounters:  04/04/21 124/66  03/03/21 (!) 162/76  02/28/21 (!) 157/80   Pulse Readings from Last 3 Encounters:  04/04/21 88  03/03/21 76  02/28/21 99    Physical Exam Vitals and nursing note reviewed.  Constitutional:      Appearance: Normal appearance. She is well-developed and well-groomed.  HENT:     Head: Normocephalic and atraumatic.  Eyes:     Conjunctiva/sclera: Conjunctivae normal.     Pupils: Pupils are equal, round, and reactive to light.  Cardiovascular:     Rate and Rhythm: Normal rate and regular rhythm.     Heart sounds: Normal heart sounds. No murmur heard. Pulmonary:     Effort: Pulmonary effort is normal.     Breath sounds: Normal breath sounds.  Abdominal:     General: Abdomen is flat. Bowel sounds are normal.     Tenderness: There is  no abdominal tenderness.  Musculoskeletal:        General: No tenderness.     Right lower leg: No edema.     Left lower leg: No edema.  Skin:    General: Skin is warm and dry.  Neurological:     General: No focal deficit present.     Mental Status: She is  alert and oriented to person, place, and time. Mental status is at baseline.     Cranial Nerves: Cranial nerves 2-12 are intact.     Motor: Motor function is intact.     Coordination: Coordination is intact.     Gait: Gait is intact.  Psychiatric:        Attention and Perception: Attention and perception normal.        Mood and Affect: Mood and affect normal.        Speech: Speech normal.        Behavior: Behavior normal. Behavior is cooperative.        Thought Content: Thought content normal.        Cognition and Memory: Cognition and memory normal.        Judgment: Judgment normal.    Assessment  Plan  Chronic pain syndrome F/u pain clinic to disc morphine ir to ER morphine since trouble getting opana   Constipation, unspecified constipation type - Plan: polyethylene glycol (MIRALAX) 17 g packet 1-2 caps + colace  Or senna-S  Also can discuss with pain clinic Rx medication for narcotic induced constipation  Encounter for screening colonoscopy - Plan: Ambulatory referral to Gastroenterology Polyp of colon, unspecified part of colon, unspecified type - Plan: Ambulatory referral to Gastroenterology  Hypophosphatemia Given food list  Hypertension improved Bystolic 5 mg qd  Telmisartan 80 mg qd  Lasix 40mg  qd  Leg edema resolved [place] 03/21/2021 dvt negative   HM Flu shot utd  Tdap UTD  pna 23 utd prevnar due covid shot 3/3 rec shingrix vaccine as well for future    Pap 06/12/19 neg neg HPV   breast exam 06/12/19 normal  [place] sch 05/18/20 and mammo with lymph swelling and pain  -goes to GI   mammo with h/o left breast cancer  04/12/16 invasive ductal ca grade 1 0.8 cm  -h/o breast cancer left  -letrozole likely causing sweating/hot flashes she will f/u with h/o in 08/2018  06/08/19 negative mammogram had f/u [name] 09/06/20 Dr. Barry Dienes surgery following as well   dexa 05/14/20 osteopenia T -2.0 rec calcium and vitamin D repeat in 3-5 years    Colonoscopy had 04/18/15 see report  tubular adenomas repeat in 5 years [facility]  06/20/20 or 6/22 wants to go back [facility] Dr. Ivor Messier since she had a polyp removed in a difficult area on he did this -referred [facility] 04/2020 for f/u 06/2020 not seen as of 12/07/20 as  Of 03/2021 referred again   Hep C neg 01/20/16    Ct ab pelvis 08/26/20  IMPRESSION: 1. No acute abdominal/pelvic findings, mass lesions or adenopathy. 2. Stable left adrenal gland adenoma. 3. Mild central intrahepatic and common bile duct dilatation without cause. Recommend correlation with liver function studies. 4. Aortic atherosclerosis + left renal artery +   Aortic Atherosclerosis (ICD10-I70.0).  Provider: Dr. Olivia Mackie McLean-[facility]ocuz[facility]

## 2021-04-04 NOTE — Patient Instructions (Addendum)
Call and schedule mammogram 05/18/21   Premier protein shakes have phosphorus ive seen   [facility] for mood/anxiety Address: [address], [place], [place] [address] Hours:  Open ? Closes 5?PM Phone: [phone]   MS Contin 12 hour release   For constipation Miralax 1-2 caps with colace (stool softner) Or  Senna + colace    Slightly higher amounts of phosphorous asparagus, beets, broccoli, celery, mushrooms, onions, peppers, radishes and tomatoes Beans/lentils Chicken, seafood/pork/turkey Sunflower/pumpkins seeds w/o saltnuts cashews, almonds, pine nuts and pistachios   Coppertone compression socks-[employer] for veins  Varicose Veins Varicose veins are veins that have become enlarged, bulged, and twisted. They most often appear in the legs. What are the causes? This condition is caused by damage to the valves in the vein. These valves help blood return to your heart. When they are damaged and they stop working properly, blood may flow backward and back up in the veins near the skin, causing the veins to get larger and appear twisted. The condition can result from any issue that causes blood to back up, like pregnancy, prolonged standing, or obesity. What increases the risk? The following factors may make you more likely to develop this condition: Being on your feet a lot. Being pregnant. Being overweight. Smoking. Having had a previous deep vein thrombosis or having a thrombotic disorder. Aging. The risk increases with age. Having a condition called Klippel-Trenaunay syndrome. What are the signs or symptoms? Symptoms of this condition include: Bulging, twisted, and bluish veins. A feeling of heaviness in your legs. This may be worse at the end of the day. Leg pain. This may be worse at the end of the day. Swelling in the leg. Changes in skin color over the veins. Swelling or pain in the legs can limit your activities. Your symptoms may  get worse when you sit or stand for long periods of time. How is this diagnosed? This condition may be diagnosed based on: Your symptoms, family history, activity levels, and lifestyle. A physical exam. You may also have tests, including an ultrasound or X-ray. How is this treated? Treatment for this condition may involve: Avoiding sitting or standing in one position for long periods of time. Wearing compression stockings. These stockings help to prevent blood clots and reduce swelling in the legs. Raising (elevating) the legs when resting. Losing weight. Exercising regularly. If you have persistent symptoms or want to improve the way your varicose veins look, you may choose to have a procedure to close the varicose veins off or to remove them. Nonsurgical treatments to close off the veins include: Sclerotherapy. In this treatment, a solution is injected into a vein to close it off. Laser treatment. The vein is heated with a laser to close it off. Radiofrequency vein ablation. An electrical current produced by radio waves is used to close off the vein. Surgical treatments to remove the veins include: Phlebectomy. In this procedure, the veins are removed through small incisions made over the veins. Vein ligation and stripping. In this procedure, incisions are made over the veins. The veins are then removed after being tied (ligated) with stitches (sutures). Follow these instructions at home: Medicines Take over-the-counter and prescription medicines only as told by your health care provider. If you were prescribed an antibiotic medicine, use it as told by your health care provider. Do not stop using the antibiotic even if you start to feel better. Activity Walk as much as possible. Walking increases blood flow.  This helps blood return to the heart and takes pressure off your veins. Do not stand or sit in one position for a long period of time. Do not sit with your legs crossed. Avoid  sitting for a long time without moving. Get up to take short walks every 1-2 hours. This is important to improve blood flow and breathing. Ask for help if you feel weak or unsteady. Return to your normal activities as told by your health care provider. Ask your health care provider what activities are safe for you. Do exercises as told by your health care provider. General instructions  Follow any diet instructions given to you by your health care provider. Elevate your legs at night to above the level of your heart. If you get a cut in the skin over the varicose vein and the vein bleeds: Lie down with your leg raised. Apply firm pressure to the cut with a clean cloth until the bleeding stops. Place a bandage (dressing) on the cut. Drink enough fluid to keep your urine pale yellow. Do not use any products that contain nicotine or tobacco. These products include cigarettes, chewing tobacco, and vaping devices, such as e-cigarettes. If you need help quitting, ask your health care provider. Wear compression stockings as told by your health care provider. Do not wear other kinds of tight clothing around your legs, pelvis, or waist. Keep all follow-up visits. This is important. Contact a health care provider if: The skin around your varicose veins starts to break down. You have more pain, redness, tenderness, or hard swelling over a vein. You are uncomfortable because of pain. You get a cut in the skin over a varicose vein and it will not stop bleeding. Get help right away if: You have chest pain. You have trouble breathing. You have severe leg pain. Summary Varicose veins are veins that have become enlarged, bulged, and twisted. They most often appear in the legs. This condition is caused by damage to the valves in the vein. These valves help blood return to your heart. Treatment for this condition includes frequent movements, wearing compression stockings, losing weight, and exercising  regularly. In some cases, procedures are done to close off or remove the veins. Nonsurgical treatments to close off the veins include sclerotherapy, laser therapy, and radiofrequency vein ablation. This information is not intended to replace advice given to you by your health care provider. Make sure you discuss any questions you have with your health care provider. Document Revised: 07/06/2020 Document Reviewed: 07/06/2020 Elsevier Patient Education  [place].   Hypophosphatemia Hypophosphatemia is when the level of phosphate in a person's blood is too low. Phosphate is important for the strength and structure of bones and teeth. Phosphate is also important for muscle functioning. Low blood phosphate levels can cause a variety of symptoms and problems. What are the causes? Sudden (acute) causes of this condition may include: Alcoholism. Severe burns. Being fed nutrition through an IV (total parenteral nutrition). Refeeding after a long period of starvation or poor nutrition. A complication of diabetes that involves high blood glucose levels (diabetic ketoacidosis). Long-term (chronic) causes of this condition may include: Chronic diarrhea. Vitamin D deficiency. Excessive use of: Antacids containing aluminum. Diuretics. Overactive parathyroid glands (hyperparathyroidism) or underactive thyroid glands (hypothyroidism). Use of steroid medicines. Low levels of other salts and minerals in the blood (electrolytes), such as magnesium (hypomagnesemia) or potassium (hypokalemia). Genetic kidney problems. A condition caused by certain types of tumors (oncogenic osteomalacia). What are the signs  or symptoms? Symptoms of this condition include: Bone pain. Bowed legs. Growth problems, such as short height. Weak muscles. Confusion. Shortness of breath. Seizures. How is this diagnosed? This condition is usually diagnosed with blood tests. How is this treated? Treatment for this  condition may include: Phosphate given by mouth (orally) or given through an IV inserted into one of your veins. The method used for giving phosphate will depend on the severity of the condition. Monitoring of your phosphate and other electrolyte levels. You may need to be monitored in the hospital during treatment. Other treatments will depend on the underlying cause of the condition. Follow these instructions at home: Follow instructions from your health care provider or dietitian about what you should not eat and drink. Keep all follow-up visits. This is important. Contact a health care provider if: You develop increased muscle weakness. Get help right away if: You have chest pain. You have difficulty breathing. You think you may have a bone fracture. You have severe pain in your joints or bones. Summary Hypophosphatemia is when the level of phosphate in a person's blood is low. Low blood phosphate levels can cause a variety of symptoms and problems. Treatment includes phosphate given by mouth or IV, along with monitoring your phosphate and other electrolyte levels. You may need to be monitored in the hospital during treatment. Contact a health care provider if you develop increased muscle weakness. This information is not intended to replace advice given to you by your health care provider. Make sure you discuss any questions you have with your health care provider. Document Revised: 07/05/2020 Document Reviewed: 07/05/2020 Elsevier Patient Education  2022 [place].   Morphine Sustained-Release Tablets What is this medication? MORPHINE (MOR feen) treats severe, chronic pain. It is prescribed when other pain medications have not worked or cannot be tolerated. It works by blocking pain signals in the brain. It belongs to a group of medications called opioids. This medication is long-acting. Do not use it to treat sudden pain. This medicine may be used for other purposes; ask your  health care provider or pharmacist if you have questions. COMMON BRAND NAME(S): ARYMO ER, MORPHABOND, MS Contin, Oramorph SR What should I tell my care team before I take this medication? They need to know if you have any of these conditions: Brain tumor Drug abuse or addiction Gallbladder disease Head injury Heart disease If you often drink alcohol Kidney disease Liver disease Low adrenal gland function Lung disease, asthma, or breathing problems Pancreatic disease Seizures Stomach or intestine problems Taken an MAOI such as Marplan, Nardil, or Parnate in the last 14 days Thyroid disease Trouble passing urine An unusual or allergic reaction to morphine, other medications, foods, dyes, or preservatives Pregnant or trying to get pregnant Breast-feeding How should I use this medication? Take this medication by mouth with a glass of water. Do not break, crush, or chew the medication. Do not take a tablet that is not whole. A broken or crushed tablet can be very dangerous. You may get too much medication. If the medication upsets your stomach, take it with food or milk. Follow the directions on the prescription label. Take the medication at the same time each day. Do not take more medication than you are told to take. A special MedGuide will be given to you by the pharmacist with each prescription and refill. Be sure to read this information carefully each time. Talk to your care team regarding the use of this medication in children. Special  care may be needed. Overdosage: If you think you have taken too much of this medicine contact a poison control center or emergency room at once. NOTE: This medicine is only for you. Do not share this medicine with others. What if I miss a dose? If you miss a dose, take it as soon as you can. If it is almost time for your next dose, take only that dose. Do not take double or extra doses. What may interact with this medication? Do not take this  medication with any of the following: Linezolid MAOIs like Marplan, Nardil, and Parnate Methylene blue Samidorphan This medication may interact with the following: Alcohol Antihistamines for allergy, cough, and cold Atropine Cimetidine Certain medications for anxiety or sleep Certain medications for bladder problems like oxybutynin, tolterodine Certain medications for depression like amitriptyline, fluoxetine, sertraline, mirtazapine, trazodone Certain medications for migraine headache like almotriptan, eletriptan, frovatriptan, naratriptan, rizatriptan, sumatriptan, zolmitriptan Certain medications for nausea or vomiting like dolasetron, granisetron, ondansetron, palonosetron Certain medications for Parkinson's disease like benztropine, trihexyphenidyl Certain medications for seizures like phenobarbital, primidone Certain medications for stomach problems like dicyclomine, hyoscyamine Certain medications for travel sickness like scopolamine Diuretics General anesthetics like halothane, isoflurane, methoxyflurane, propofol Ipratropium Medications that relax muscles Other narcotic medications for pain or cough Phenothiazines like chlorpromazine, mesoridazine, prochlorperazine, thioridazine Quinidine Verapamil This list may not describe all possible interactions. Give your health care provider a list of all the medicines, herbs, non-prescription drugs, or dietary supplements you use. Also tell them if you smoke, drink alcohol, or use illegal drugs. Some items may interact with your medicine. What should I watch for while using this medication? Tell your care team if your pain does not go away, if it gets worse, or if you have new or a different type of pain. You may develop tolerance to this medication. Tolerance means that you will need a higher dose of the medication for pain relief. Tolerance is normal and is expected if you take this medication for a long time. There are different  types of narcotic medications (opioids) for pain. If you take more than one type at the same time, you may have more side effects. Give your care team a list of all medications you use. He or she will tell you how much medication to take. Do not take more medication than directed. Call emergency services if you have problems breathing. Do not suddenly stop taking your medication because you may develop a severe reaction. Your body becomes used to the medication. This does NOT mean you are addicted. Addiction is a behavior related to getting and using a medication for a nonmedical reason. If you have pain, you have a medical reason to take pain medication. Your care team will tell you how much medication to take. If your care team wants you to stop the medication, the dose will be slowly lowered over time to avoid any side effects. Talk to your care team about naloxone and how to get it. Naloxone is an emergency medication used for an opioid overdose. An overdose can happen if you take too much opioid. It can also happen if an opioid is taken with some other medications or substances, like alcohol. Know the symptoms of an overdose, like trouble breathing, unusually tired or sleepy, or not being able to respond or wake up. Make sure to tell caregivers and close contacts where it is stored. Make sure they know how to use it. After naloxone is given, you must get emergency help  right away. Naloxone is a temporary treatment. Repeat doses may be needed. You may get drowsy or dizzy. Do not drive, use machinery, or do anything that needs mental alertness until you know how this medication affects you. Do not stand up or sit up quickly, especially if you are an older patient. This reduces the risk of dizzy or fainting spells. Alcohol may interfere with the effect of this medication. Avoid alcoholic drinks. This medication will cause constipation. If you do not have a bowel movement for 3 days, call your care team. Your  mouth may get dry. Chewing sugarless gum or sucking hard candy and drinking plenty of water may help. Contact your care team if the problem does not go away or is severe. The tablet shell for some brands of this medication does not dissolve. This is normal. The tablet shell may appear whole in the stool. This is not a cause for concern. What side effects may I notice from receiving this medication? Side effects that you should report to your care team as soon as possible: Allergic reactions--skin rash, itching, hives, swelling of the face, lips, tongue, or throat CNS depression--slow or shallow breathing, shortness of breath, feeling faint, dizziness, confusion, difficulty staying awake Low adrenal gland function--nausea, vomiting, loss of appetite, unusual weakness or fatigue, dizziness Low blood pressure--dizziness, feeling faint or lightheaded, blurry vision Side effects that usually do not require medical attention (report to your care team if they continue or are bothersome): Constipation Dizziness Drowsiness Dry mouth Headache Nausea Vomiting This list may not describe all possible side effects. Call your doctor for medical advice about side effects. You may report side effects to FDA at 1-800-FDA-1088. Where should I keep my medication? Keep out of the reach of children and pets. This medication can be abused. Keep your medication in a safe place to protect it from theft. Do not share this medication with anyone. Selling or giving away this medication is dangerous and is against the law. Store at room temperature between 15 and 30 degrees C (59 and 86 degrees F). Protect from light. This medication may cause harm and death if it is taken by other adults, children, or pets. Return medication that has not been used to an official disposal site. If you cannot return the medication, flush it down the toilet. Do not use the medication after the expiration date. NOTE: This sheet is a summary.  It may not cover all possible information. If you have questions about this medicine, talk to your doctor, pharmacist, or health care provider.  2022 Elsevier/Gold Standard (2020-03-04 00:00:00)

## 2021-04-11 ENCOUNTER — Ambulatory Visit: Payer: Self-pay | Admitting: Pharmacist

## 2021-04-11 NOTE — Chronic Care Management (AMB) (Signed)
?  Chronic Care Management  ? ?Note ? ?04/11/2021 ?Name: Ashley Cross MRN: [id number] DOB: March 21, 1955 ? ? ? ?Closing pharmacy CCM case at this time. . Patient has clinic contact information for future questions or concerns.  ? ?[name], PharmD, [place], CPP ?Clinical Pharmacist ?Therapist, music at [facility] ?[phone] ? ?

## 2021-04-21 ENCOUNTER — Ambulatory Visit (INDEPENDENT_AMBULATORY_CARE_PROVIDER_SITE_OTHER): Payer: PPO

## 2021-04-21 VITALS — Ht 64.0 in | Wt 130.0 lb

## 2021-04-21 DIAGNOSIS — Z0000 Encounter for general adult medical examination without abnormal findings: Secondary | ICD-10-CM

## 2021-04-21 NOTE — Patient Instructions (Addendum)
?  Ashley Cross , ?Thank you for taking time to come for your Medicare Wellness Visit. I appreciate your ongoing commitment to your health goals. Please review the following plan we discussed and let me know if I can assist you in the future.  ? ?These are the goals we discussed: ? Goals   ? ?  Begin and Stick with Counseling-Depression   ?  Timeframe:  Short-Term Goal ?Priority:  Medium ?Start Date:  03/08/21                       ?Expected End Date:    09/05/21                  ? ?Follow Up Date 04/26/21  ?  ?- check out counseling-continue to consider mental health counseling if needed in the future ? ?  ?Why is this important?   ?Beating depression may take some time.  ?If you don't feel better right away, don't give up on your treatment plan.  ?  ?Notes:  ?  ?  Increase physical activity   ?  Walk for exercise ?  ? ?  ?  ?This is a list of the screening recommended for you and due dates:  ?Health Maintenance  ?Topic Date Due  ? Zoster (Shingles) Vaccine (1 of 2) 07/22/2021*  ? COVID-19 Vaccine (4 - Booster) 08/05/2021*  ? Mammogram  06/07/2021  ? Pap Smear  06/12/2022  ? Colon Cancer Screening  04/17/2025  ? Tetanus Vaccine  01/29/2026  ? Flu Shot  Completed  ? Hepatitis C Screening: USPSTF Recommendation to screen - Ages 75-79 yo.  Completed  ? HIV Screening  Completed  ? HPV Vaccine  Aged Out  ?*Topic was postponed. The date shown is not the original due date.  ?  ?

## 2021-04-21 NOTE — Progress Notes (Signed)
Subjective:   Ashley Cross is a 66 y.o. female who presents for Medicare Annual (Subsequent) preventive examination.  Review of Systems    No ROS.  Medicare Wellness Virtual Visit.  Visual/audio telehealth visit, UTA vital signs.   See social history for additional risk factors.   Cardiac Risk Factors include: advanced age (>76men, >37 women)     Objective:    Today's Vitals   04/21/21 0907  Weight: 130 lb (59 kg)  Height: 5\' 4"  (1.626 m)   Body mass index is 22.31 kg/m.  Advanced Directives 04/21/2021 02/17/2021 02/16/2021 11/17/2020 04/20/2020 06/24/2017 09/07/2016  Does Patient Have a Medical Advance Directive? No - No No No No Yes  Would patient like information on creating a medical advance directive? No - Patient declined No - Patient declined - - No - Patient declined Yes (MAU/Ambulatory/Procedural Areas - Information given) -  Pre-existing out of facility DNR order (yellow form or pink MOST form) - - - - - - -    Current Medications (verified) Outpatient Encounter Medications as of 04/21/2021  Medication Sig   albuterol (VENTOLIN HFA) 108 (90 Base) MCG/ACT inhaler Inhale 1-2 puffs into the lungs every 4 (four) hours as needed for wheezing or shortness of breath.   buPROPion (WELLBUTRIN XL) 150 MG 24 hr tablet Take 1 tablet (150 mg total) by mouth daily.   Cholecalciferol (VITAMIN D3 PO) Take by mouth.   Cyanocobalamin (VITAMIN B-12 PO) Take by mouth.   cyclobenzaprine (FLEXERIL) 10 MG tablet Take 10 mg by mouth every 8 (eight) hours as needed. (Patient not taking: Reported on 04/04/2021)   DULoxetine (CYMBALTA) 60 MG capsule Take 1 capsule (60 mg total) by mouth daily.   feeding supplement (ENSURE ENLIVE / ENSURE PLUS) LIQD Take 237 mLs by mouth 2 (two) times daily between meals.   fluticasone (FLONASE) 50 MCG/ACT nasal spray USE TWO SPRAYS IN EACH NOSTRIL DAILY   furosemide (LASIX) 40 MG tablet Take 1 tablet (40 mg total) by mouth daily as needed. For edema    gabapentin (NEURONTIN) 600 MG tablet Take 1,200 mg by mouth 3 (three) times daily.   hydrOXYzine (VISTARIL) 25 MG capsule TAKE ONE CAPSULE EVERY EIGHT HOURS AS NEEDED FOR ANXIETY   ipratropium (ATROVENT) 0.06 % nasal spray PLACE 2 SPRAYS INTO BOTH NOSTRILS 3 TIMES DAILY AS NEEDED   lamoTRIgine (LAMICTAL) 200 MG tablet Take 1 tablet (200 mg total) by mouth daily.   letrozole (FEMARA) 2.5 MG tablet Take 1 tablet (2.5 mg total) by mouth daily.   montelukast (SINGULAIR) 10 MG tablet TAKE ONE TABLET BY MOUTH AT BEDTIME   morphine (MSIR) 15 MG tablet Take 1 tablet by mouth every 4 (four) hours as needed. Every 4-6 hours prn   Multiple Vitamins-Minerals (MULTIVITAMIN) tablet Take 1 tablet by mouth daily. (Patient not taking: Reported on 04/04/2021)   nebivolol (BYSTOLIC) 5 MG tablet Take 1 tablet (5 mg total) by mouth daily.   polyethylene glycol (MIRALAX) 17 g packet Take 17-34 g by mouth daily as needed.   Pyridoxine HCl (VITAMIN B-6 PO) Take by mouth.   SENEXON-S 8.6-50 MG tablet TAKE 1-2 TABLETS BY MOUTH DAILY AS NEEDED FOR MILD CONSTIPATION.   telmisartan (MICARDIS) 80 MG tablet Take 1 tablet (80 mg total) by mouth daily.   Tiotropium Bromide-Olodaterol (STIOLTO RESPIMAT) 2.5-2.5 MCG/ACT AERS Inhale 2 puffs into the lungs daily. (Patient not taking: Reported on 04/04/2021)   triamcinolone cream (KENALOG) 0.1 % Apply 1 application topically 2 (two) times daily. Prn  legs itching   No facility-administered encounter medications on file as of 04/21/2021.    Allergies (verified) Anastrozole, Augmentin [amoxicillin-pot clavulanate], Norvasc [amlodipine], Topamax [topiramate], Sulfa antibiotics, and Sulfonamide derivatives   History: Past Medical History:  Diagnosis Date   Anxiety    Breast cancer (HCC)    Left breast(nipple mass) 2018   Cervical stenosis of spine    Chicken pox    Chronic back pain    Colon polyps    COPD (chronic obstructive pulmonary disease) (HCC)    Cyst of left nipple     Depression    Hypertension    Insomnia    Personal history of radiation therapy    Past Surgical History:  Procedure Laterality Date   BACK SURGERY     x 3 (2010 x 2; 2013 x 1)  Dr. Yetta Barre   BREAST BIOPSY Left    core- neg   BREAST LUMPECTOMY Left 2018   BREAST SURGERY Left    breast biopsy   CERVICAL CONE BIOPSY     CERVICAL FUSION     times 2   COLONOSCOPY WITH PROPOFOL N/A 04/18/2015   Procedure: COLONOSCOPY WITH PROPOFOL;  Surgeon: Elnita Maxwell, MD;  Location: [facility] ENDOSCOPY;  Service: Endoscopy;  Laterality: N/A;   ELBOW SURGERY     2014 b/l elbows per pt ulnar nerve    ELBOW SURGERY     x2    EPIDURAL BLOCK INJECTION     Dr. Ollen Bowl    MASS EXCISION Left 04/12/2016   Procedure: EXCISION OF LEFT NIPPLE MASS;  Surgeon: Almond Lint, MD;  Location: [facility];  Service: General;  Laterality: Left;   NECK SURGERY     POSTERIOR CERVICAL FUSION/FORAMINOTOMY  03/07/2011   Procedure: POSTERIOR CERVICAL FUSION/FORAMINOTOMY LEVEL 4;  Surgeon: Tia Alert, MD;  Location: [facility] NEURO ORS;  Service: Neurosurgery;  Laterality: N/A;  cervical three to thoracic one posterior cervical fusion    SENTINEL NODE BIOPSY Left 05/29/2016   Procedure: LEFT SENTINEL LYMPH NODE BIOPSY;  Surgeon: Almond Lint, MD;  Location: [facility] OR;  Service: General;  Laterality: Left;   TONSILLECTOMY     Family History  Problem Relation Age of Onset   Breast cancer Mother 87   Hypertension Mother    Cancer Mother        breast dx'ed 72    Cancer Father        prostate   Hypertension Father    Drug abuse Daughter    Hypertension Maternal Grandmother    Breast cancer Cousin    Social History   Socioeconomic History   Marital status: Divorced    Spouse name: Not on file   Number of children: 2   Years of education: Not on file   Highest education level: Not on file  Occupational History   Not on file  Tobacco Use   Smoking status: Every Day    Packs/day: 0.25    Years: 30.00     Pack years: 7.50    Types: Cigarettes   Smokeless tobacco: Never   Tobacco comments:    work days - 6 cigarettes; weekends - ~10 - reported on 10/13/2020  Vaping Use   Vaping Use: Every day  Substance and Sexual Activity   Alcohol use: Yes    Comment: weekends and some today   Drug use: No    Comment: on opana since jan 2018   Sexual activity: Not on file  Other Topics Concern   Not  on file  Social History Narrative   As of 01/25/17 not sexually active of in relationship    Divorced   2 kids son and daughter (though daughter died of suicide in 07-23-17)   Son lives in [place] now as of 12/2018       Disability    Loves animals has 3 dogs           Social Determinants of Corporate investment banker Strain: Low Risk    Difficulty of Paying Living Expenses: Not very hard  Food Insecurity: No Food Insecurity   Worried About Programme researcher, broadcasting/film/video in the Last Year: Never true   Barista in the Last Year: Never true  Transportation Needs: No Transportation Needs   Lack of Transportation (Medical): No   Lack of Transportation (Non-Medical): No  Physical Activity: Inactive   Days of Exercise per Week: 0 days   Minutes of Exercise per Session: 0 min  Stress: Not on file  Social Connections: Socially Isolated   Frequency of Communication with Friends and Family: More than three times a week   Frequency of Social Gatherings with Friends and Family: Three times a week   Attends Religious Services: Never   Active Member of Clubs or Organizations: No   Attends Banker Meetings: Never   Marital Status: Divorced    Tobacco Counseling Ready to quit: Not Answered Counseling given: Not Answered Tobacco comments: work days - 6 cigarettes; weekends - ~10 - reported on 10/13/2020   Clinical Intake:  Pre-visit preparation completed: Yes        Diabetes: No  How often do you need to have someone help you when you read instructions, pamphlets, or other written  materials from your doctor or pharmacy?: 1 - Never   Interpreter Needed?: No      Activities of Daily Living In your present state of health, do you have any difficulty performing the following activities: 04/21/2021 02/17/2021  Hearing? N N  Vision? N N  Difficulty concentrating or making decisions? - N  Walking or climbing stairs? N Y  Dressing or bathing? N Y  Doing errands, shopping? N N  Preparing Food and eating ? N -  Using the Toilet? N -  In the past six months, have you accidently leaked urine? N -  Do you have problems with loss of bowel control? N -  Managing your Medications? N -  Managing your Finances? N -  Housekeeping or managing your Housekeeping? N -  Some recent data might be hidden    Patient Care Team: McLean-Scocuzza, Pasty Spillers, MD as PCP - General (Internal Medicine) Margaretann Loveless, MD (Internal Medicine) Axel Filler Larna Daughters, NP as Nurse Practitioner (Hematology and Oncology) Serena Croissant, MD as Consulting Physician (Hematology and Oncology) Almond Lint, MD as Consulting Physician (General Surgery) Dorothy Puffer, MD as Consulting Physician (Radiation Oncology)  Indicate any recent Medical Services you may have received from other than [facility] providers in the past year (date may be approximate).     Assessment:   This is a routine wellness examination for Luma.  Virtual Visit via Telephone Note  I connected with  Ashley Cross on 04/21/21 at  9:00 AM EDT by telephone and verified that I am speaking with the correct person using two identifiers.  Persons participating in the virtual visit: patient/Nurse Health Advisor   I discussed the limitations, risks, security and privacy concerns of performing an evaluation and management service  by telephone and the availability of in person appointments. The patient expressed understanding and agreed to proceed.  Interactive audio and video telecommunications were attempted between this nurse and  patient, however failed, due to patient having technical difficulties OR patient did not have access to video capability.  We continued and completed visit with audio only.  Some vital signs may be absent or patient reported.   Hearing/Vision screen Hearing Screening - Comments:: Patient is able to hear conversational tones without difficulty. No issues reported. Vision Screening - Comments:: Wears glasses  Dietary issues and exercise activities discussed: Current Exercise Habits: Home exercise routine, Intensity: Mild   Goals Addressed             This Visit's Progress    Increase physical activity       Walk for exercise       Depression Screen PHQ 2/9 Scores 04/21/2021 03/29/2021 03/03/2021 04/20/2020 06/12/2019 12/31/2018 09/30/2017  PHQ - 2 Score 0 1 3 - 4 0 4  PHQ- 9 Score 2 - 13 - 17 - 18  Exception Documentation - - - Other- indicate reason in comment box - - -  Not completed - - - Counsel every 3 weeks with Dr. Gaynelle Arabian - - -  Some encounter information is confidential and restricted. Go to Review Flowsheets activity to see all data.    Fall Risk Fall Risk  04/21/2021 12/07/2020 08/05/2020 04/20/2020 09/23/2019  Falls in the past year? 1 1 1  0 0  Number falls in past yr: - 1 1 0 0  Injury with Fall? - 1 0 0 0  Risk Factor Category  - - - - -  Risk for fall due to : History of fall(s) History of fall(s) History of fall(s) - -  Follow up - Falls evaluation completed Falls evaluation completed Falls evaluation completed Falls evaluation completed    FALL RISK PREVENTION PERTAINING TO THE HOME:  Home free of loose throw rugs in walkways, pet beds, electrical cords, etc? Yes  Adequate lighting in your home to reduce risk of falls? Yes   ASSISTIVE DEVICES UTILIZED TO PREVENT FALLS:  Life alert? No  Use of a cane, walker or w/c? No   TIMED UP AND GO:  Was the test performed? No .   Cognitive Function:     6CIT Screen 04/20/2020  What Year? 0 points  What month? 0 points   What time? 0 points  Count back from 20 0 points  Months in reverse 0 points  Repeat phrase 0 points  Total Score 0    Immunizations Immunization History  Administered Date(s) Administered   Influenza,inj,Quad PF,6+ Mos 01/25/2017, 11/26/2017, 12/17/2018, 12/07/2020   Moderna Sars-Covid-2 Vaccination 01/08/2020   PFIZER(Purple Top)SARS-COV-2 Vaccination 05/21/2019, 06/11/2019   Pneumococcal Polysaccharide-23 03/26/2018   Td 11/15/2003   Tdap 01/30/2016    Shingrix Completed?: No.    Education has been provided regarding the importance of this vaccine. Patient has been advised to call insurance company to determine out of pocket expense if they have not yet received this vaccine. Advised may also receive vaccine at local pharmacy or Health Dept. Verbalized acceptance and understanding.  Screening Tests Health Maintenance  Topic Date Due   Zoster Vaccines- Shingrix (1 of 2) 07/22/2021 (Originally 06/14/1974)   COVID-19 Vaccine (4 - Booster) 08/05/2021 (Originally 03/04/2020)   MAMMOGRAM  06/07/2021   PAP SMEAR-Modifier  06/12/2022   COLONOSCOPY (Pts 45-65yrs Insurance coverage will need to be confirmed)  04/17/2025   TETANUS/TDAP  01/29/2026  INFLUENZA VACCINE  Completed   Hepatitis C Screening  Completed   HIV Screening  Completed   HPV VACCINES  Aged Out    Health Maintenance There are no preventive care reminders to display for this patient.  Mammogram- ordered 03/03/21. Number provided for scheduling.  DG Chest 2 view: completed 11/17/20.   Vision Screening: Recommended annual ophthalmology exams for early detection of glaucoma and other disorders of the eye.  Dental Screening: Recommended annual dental exams for proper oral hygiene  Community Resource Referral / Chronic Care Management: CRR required this visit?  No   CCM required this visit?  No      Plan:   Keep all routine maintenance appointments.   I have personally reviewed and noted the following in the  patient's chart:   Medical and social history Use of alcohol, tobacco or illicit drugs  Current medications and supplements including opioid prescriptions.  Functional ability and status Nutritional status Physical activity Advanced directives List of other physicians Hospitalizations, surgeries, and ER visits in previous 12 months Vitals Screenings to include cognitive, depression, and falls Referrals and appointments  In addition, I have reviewed and discussed with patient certain preventive protocols, quality metrics, and best practice recommendations. A written personalized care plan for preventive services as well as general preventive health recommendations were provided to patient.     Ashok Pall, LPN   1/61/0960

## 2021-04-24 ENCOUNTER — Other Ambulatory Visit: Payer: Self-pay | Admitting: Internal Medicine

## 2021-04-24 DIAGNOSIS — R0981 Nasal congestion: Secondary | ICD-10-CM

## 2021-04-24 DIAGNOSIS — R0982 Postnasal drip: Secondary | ICD-10-CM

## 2021-04-26 ENCOUNTER — Telehealth: Payer: PPO

## 2021-04-26 ENCOUNTER — Telehealth: Payer: Self-pay | Admitting: *Deleted

## 2021-04-26 NOTE — Telephone Encounter (Signed)
?  Care Management  ? ?Follow Up Note ? ? ?04/26/2021 ?Name: Ashley Cross MRN: [id number] DOB: November 24, 1955 ? ? ?Referred by: McLean-Scocuzza, Nino Glow, MD ?Reason for referral : Care Coordination ? ? ?An unsuccessful telephone outreach was attempted today. The patient was referred to the case management team for assistance with care management and care coordination.  ? ?Follow Up Plan: Telephone follow up appointment with care management team member to be re-scheduled by careguide: ? ?Jordin Vicencio, LCSW ?[place] ?[phone] ? ?

## 2021-05-05 ENCOUNTER — Other Ambulatory Visit: Payer: Self-pay | Admitting: Internal Medicine

## 2021-05-05 DIAGNOSIS — J449 Chronic obstructive pulmonary disease, unspecified: Secondary | ICD-10-CM

## 2021-05-05 DIAGNOSIS — R0982 Postnasal drip: Secondary | ICD-10-CM

## 2021-05-08 ENCOUNTER — Ambulatory Visit (INDEPENDENT_AMBULATORY_CARE_PROVIDER_SITE_OTHER): Payer: PPO | Admitting: *Deleted

## 2021-05-08 ENCOUNTER — Other Ambulatory Visit: Payer: Self-pay | Admitting: Internal Medicine

## 2021-05-08 ENCOUNTER — Other Ambulatory Visit: Payer: Self-pay | Admitting: Psychiatry

## 2021-05-08 DIAGNOSIS — F419 Anxiety disorder, unspecified: Secondary | ICD-10-CM

## 2021-05-08 DIAGNOSIS — F3341 Major depressive disorder, recurrent, in partial remission: Secondary | ICD-10-CM

## 2021-05-08 NOTE — Chronic Care Management (AMB) (Signed)
?Chronic Care Management  ? ? Clinical Social Work Note ? ?05/08/2021 ?Name: Ashley Cross MRN: [id number] DOB: October 10, 1955 ? ?Ashley Cross is a 66 y.o. year old female who is a primary care patient of McLean-Scocuzza, Nino Glow, MD. The CCM team was consulted to assist the patient with chronic disease management and/or care coordination needs related to: Mental Health Counseling and Resources.  ? ?Engaged with patient by telephone for follow up visit in response to provider referral for social work chronic care management and care coordination services.  ? ?Consent to Services:  ?The patient was given information about Chronic Care Management services, agreed to services, and gave verbal consent prior to initiation of services.  Please see initial visit note for detailed documentation.  ? ?Patient agreed to services and consent obtained.  ? ?Assessment: Review of patient past medical history, allergies, medications, and health status, including review of relevant consultants reports was performed today as part of a comprehensive evaluation and provision of chronic care management and care coordination services.    ? ?SDOH (Social Determinants of Health) assessments and interventions performed:   ? ?Advanced Directives Status: Not addressed in this encounter. ? ?CCM Care Plan ? ?Allergies  ?Allergen Reactions  ? Anastrozole   ? Augmentin [Amoxicillin-Pot Clavulanate]   ?  Diarrhea ?  ? Norvasc [Amlodipine]   ?  Swelling ?  ? Topamax [Topiramate]   ?  Didn't like the way made feel more irritatble/sad   ? Sulfa Antibiotics Itching and Rash  ? Sulfonamide Derivatives Itching and Rash  ? ? ?Outpatient Encounter Medications as of 05/08/2021  ?Medication Sig Note  ? albuterol (VENTOLIN HFA) 108 (90 Base) MCG/ACT inhaler INHALE ONE (1) TO TWO (2) PUFFS INTO THELUNGS EVERY 4 HOURS AS NEEDED FOR WHEEZING OR SHORTNESS OF BREATH   ? buPROPion (WELLBUTRIN XL) 150 MG 24 hr tablet Take 1 tablet (150 mg total) by mouth  daily.   ? Cholecalciferol (VITAMIN D3 PO) Take by mouth.   ? Cyanocobalamin (VITAMIN B-12 PO) Take by mouth.   ? cyclobenzaprine (FLEXERIL) 10 MG tablet Take 10 mg by mouth every 8 (eight) hours as needed. (Patient not taking: Reported on 04/04/2021) 04/19/2020: Taking 1-2 times daily  ? DULoxetine (CYMBALTA) 60 MG capsule Take 1 capsule (60 mg total) by mouth daily.   ? feeding supplement (ENSURE ENLIVE / ENSURE PLUS) LIQD Take 237 mLs by mouth 2 (two) times daily between meals.   ? fluticasone (FLONASE) 50 MCG/ACT nasal spray USE TWO SPRAYS IN EACH NOSTRIL DAILY   ? furosemide (LASIX) 40 MG tablet Take 1 tablet (40 mg total) by mouth daily as needed. For edema   ? gabapentin (NEURONTIN) 600 MG tablet Take 1,200 mg by mouth 3 (three) times daily.   ? hydrOXYzine (VISTARIL) 25 MG capsule TAKE ONE CAPSULE EVERY EIGHT HOURS AS NEEDED FOR ANXIETY 02/22/2021: QPM  ? ipratropium (ATROVENT) 0.06 % nasal spray PLACE 2 SPRAYS INTO BOTH NOSTRILS 3 TIMES DAILY AS NEEDED   ? lamoTRIgine (LAMICTAL) 200 MG tablet Take 1 tablet (200 mg total) by mouth daily.   ? letrozole (FEMARA) 2.5 MG tablet Take 1 tablet (2.5 mg total) by mouth daily.   ? montelukast (SINGULAIR) 10 MG tablet TAKE ONE TABLET BY MOUTH AT BEDTIME   ? morphine (MSIR) 15 MG tablet Take 1 tablet by mouth every 4 (four) hours as needed. Every 4-6 hours prn 02/22/2021: 1 QAM  ? Multiple Vitamins-Minerals (MULTIVITAMIN) tablet Take 1 tablet by mouth daily. (Patient not  taking: Reported on 04/04/2021)   ? nebivolol (BYSTOLIC) 5 MG tablet Take 1 tablet (5 mg total) by mouth daily.   ? polyethylene glycol (MIRALAX) 17 g packet Take 17-34 g by mouth daily as needed.   ? Pyridoxine HCl (VITAMIN B-6 PO) Take by mouth.   ? SENEXON-S 8.6-50 MG tablet TAKE 1-2 TABLETS BY MOUTH DAILY AS NEEDED FOR MILD CONSTIPATION.   ? telmisartan (MICARDIS) 80 MG tablet Take 1 tablet (80 mg total) by mouth daily.   ? Tiotropium Bromide-Olodaterol (STIOLTO RESPIMAT) 2.5-2.5 MCG/ACT AERS Inhale 2  puffs into the lungs daily. (Patient not taking: Reported on 04/04/2021)   ? triamcinolone cream (KENALOG) 0.1 % Apply 1 application topically 2 (two) times daily. Prn legs itching   ? ?No facility-administered encounter medications on file as of 05/08/2021.  ? ? ?Patient Active Problem List  ? Diagnosis Date Noted  ? Carotid stenosis 03/26/2021  ? Acute renal failure due to traumatic rhabdomyolysis ([place]) 02/17/2021  ? Spinal stenosis of lumbar region 12/20/2020  ? H/O cervical spine surgery 12/20/2020  ? Abnormal MRI, cervical spine 12/20/2020  ? Abnormal MRI, lumbar spine 12/20/2020  ? Arthritis of neck 12/20/2020  ? Arthritis, lumbar spine 12/20/2020  ? Abnormal gait 12/20/2020  ? Frequent falls 12/20/2020  ? Cervical radiculopathy 12/20/2020  ? Numbness of fingers of both hands 12/20/2020  ? Carotid bruit present 10/06/2020  ? Noncompliance with treatment plan 09/16/2020  ? Polyp of colon 09/04/2020  ? Intrahepatic bile duct dilation 09/04/2020  ? Adrenal adenoma, left 09/04/2020  ? Atherosclerosis of renal artery ([place]) 09/04/2020  ? Elevated brain natriuretic peptide (BNP) level 08/09/2020  ? MDD (major depressive disorder), recurrent, in full remission ([place]) 07/28/2020  ? Thoracic spine pain 07/28/2020  ? MDD (major depressive disorder), recurrent, in partial remission ([place]) 06/02/2020  ? Cervical spondylosis 05/24/2020  ? Aortic atherosclerosis ([place]) 05/02/2020  ? Tubular adenoma of colon 04/28/2020  ? High risk medication use 09/15/2019  ? At risk for long QT syndrome 01/13/2019  ? Chronic left shoulder pain 12/31/2018  ? Cervical postlaminectomy syndrome 10/20/2018  ? Alcohol use disorder, mild, abuse 09/16/2018  ? Pulmonary emphysema ([place]) 01/22/2018  ? HTN (hypertension) 05/06/2017  ? HLD (hyperlipidemia) 05/02/2017  ? Osteopenia 05/01/2017  ? Vitamin D deficiency 01/27/2017  ? Chronic pain syndrome 01/27/2017  ? Chronic insomnia 01/27/2017  ? Tobacco use disorder 01/27/2017  ? Malignant neoplasm of  lower-outer quadrant of left breast of female, estrogen receptor positive ([place]) 04/20/2016  ? Alcohol use 02/07/2016  ? Cervical post-laminectomy syndrome 09/06/2015  ? Lumbar radiculopathy 10/31/2012  ? Neuropathy 12/31/2006  ? ? ?Conditions to be addressed/monitored: Depression; Mental Health Concerns  ? ?Care Plan : Medication Management  ?Updates made by Vern Claude, LCSW since 05/08/2021 12:00 AM  ?  ? ?Care Plan : General Social Work (Adult)  ?Updates made by Vern Claude, LCSW since 05/08/2021 12:00 AM  ?  ? ?Problem: [facility] AMB "PATIENT-SPECIFIC PROBLEM"   ?Note:   ?                                            ? ?  ?Current Barriers:  ?Knowledge deficits related to accessing mental health provider in patient with Depression  ?Patient has a history of experiencing symptoms of  depression which seem to be exacerbated by the loss of her daughter.     ?  Patient needs Support, Education, and Care Coordination in order to meet unmet mental health needs  ?Mental Health Concerns  ?  ?Clinical Social Work Goal(s):  ?Over the next 90 days, patient will work with SW monthly by telephone or in person to ensure management of  symptoms of depression until connected for ongoing counseling resources of needed  ?Patient will implement clinical interventions discussed today to decreases symptoms of depression and increase knowledge and/or ability of: self-management skills. ?Interventions:  ?Assessed patient's understanding, education, previous treatment and care coordination needs  ?Provided basic mental health support, education and interventions  ?Patient discussed set backs in mood as her daughter's birthday is [birth year]-grief reaction normalized-emotional support provided ?Patient confirmed weaning herself from her anti-depressant medications but now that symptoms are resurfacing is considering resuming ?Coping strategies for depression and grief discussed including resuming medications, establishing new rituals to honor  daughter's life ?Followed up on participation in  long term counseling-Patient continues to decline outpatient therapy but will continue to consider ?Patient denies drinking excessively, declined need for resources i

## 2021-05-08 NOTE — Patient Instructions (Signed)
Visit Information ? ?Thank you for taking time to visit with me today. Please don't hesitate to contact me if I can be of assistance to you before our next scheduled telephone appointment. ? ?Following are the goals we discussed today:  ?check out counseling-continue to consider mental health counseling if needed in the future ? ? ?Our next appointment is by telephone on 05/24/21 at 4pm ? ?Please call the care guide team at [phone] if you need to cancel or reschedule your appointment.  ? ?If you are experiencing a Mental Health or [place] or need someone to talk to, please call the Suicide and Crisis Lifeline: 988  ? ?Patient verbalizes understanding of instructions and care plan provided today and agrees to view in [place]. Active MyChart status confirmed with patient.   ? ?Telephone follow up appointment with care management team member scheduled for: 05/24/21 ? ?Cartina Brousseau, LCSW ?[place] ?[phone] ? ?

## 2021-05-09 NOTE — Telephone Encounter (Signed)
LMTCB it appears that these are not & have not been being filled by Dr. Olivia Mackie. ?

## 2021-05-10 ENCOUNTER — Telehealth: Payer: PPO

## 2021-05-11 DIAGNOSIS — M546 Pain in thoracic spine: Secondary | ICD-10-CM | POA: Diagnosis not present

## 2021-05-11 DIAGNOSIS — M5416 Radiculopathy, lumbar region: Secondary | ICD-10-CM | POA: Diagnosis not present

## 2021-05-11 DIAGNOSIS — M961 Postlaminectomy syndrome, not elsewhere classified: Secondary | ICD-10-CM | POA: Diagnosis not present

## 2021-05-11 DIAGNOSIS — G894 Chronic pain syndrome: Secondary | ICD-10-CM | POA: Diagnosis not present

## 2021-05-24 ENCOUNTER — Ambulatory Visit: Payer: PPO | Admitting: *Deleted

## 2021-05-24 DIAGNOSIS — F419 Anxiety disorder, unspecified: Secondary | ICD-10-CM

## 2021-05-24 DIAGNOSIS — F3341 Major depressive disorder, recurrent, in partial remission: Secondary | ICD-10-CM

## 2021-05-24 NOTE — Chronic Care Management (AMB) (Signed)
?Chronic Care Management  ? ? Clinical Social Work Note ? ?05/24/2021 ?Name: Ashley Cross MRN: [id number] DOB: 10-26-1955 ? ?Ashley Cross is a 66 y.o. year old female who is a primary care patient of McLean-Scocuzza, Nino Glow, MD. The CCM team was consulted to assist the patient with chronic disease management and/or care coordination needs related to: Mental Health Counseling and Resources.  ? ?Engaged with patient by telephone for follow up visit in response to provider referral for social work chronic care management and care coordination services.  ? ?Consent to Services:  ?The patient was given information about Chronic Care Management services, agreed to services, and gave verbal consent prior to initiation of services.  Please see initial visit note for detailed documentation.  ? ?Patient agreed to services and consent obtained.  ? ?Assessment: Review of patient past medical history, allergies, medications, and health status, including review of relevant consultants reports was performed today as part of a comprehensive evaluation and provision of chronic care management and care coordination services.    ? ?SDOH (Social Determinants of Health) assessments and interventions performed:   ? ?Advanced Directives Status: Not addressed in this encounter. ? ?CCM Care Plan ? ?Allergies  ?Allergen Reactions  ? Anastrozole   ? Augmentin [Amoxicillin-Pot Clavulanate]   ?  Diarrhea ?  ? Norvasc [Amlodipine]   ?  Swelling ?  ? Topamax [Topiramate]   ?  Didn't like the way made feel more irritatble/sad   ? Sulfa Antibiotics Itching and Rash  ? Sulfonamide Derivatives Itching and Rash  ? ? ?Outpatient Encounter Medications as of 05/24/2021  ?Medication Sig Note  ? albuterol (VENTOLIN HFA) 108 (90 Base) MCG/ACT inhaler INHALE ONE (1) TO TWO (2) PUFFS INTO THELUNGS EVERY 4 HOURS AS NEEDED FOR WHEEZING OR SHORTNESS OF BREATH   ? buPROPion (WELLBUTRIN XL) 150 MG 24 hr tablet TAKE 1 TABLET BY MOUTH ONCE DAILY   ?  Cholecalciferol (VITAMIN D3 PO) Take by mouth.   ? Cyanocobalamin (VITAMIN B-12 PO) Take by mouth.   ? cyclobenzaprine (FLEXERIL) 10 MG tablet Take 10 mg by mouth every 8 (eight) hours as needed. (Patient not taking: Reported on 04/04/2021) 04/19/2020: Taking 1-2 times daily  ? DULoxetine (CYMBALTA) 60 MG capsule TAKE 1 CAPSULE BY MOUTH ONCE DAILY   ? feeding supplement (ENSURE ENLIVE / ENSURE PLUS) LIQD Take 237 mLs by mouth 2 (two) times daily between meals.   ? fluticasone (FLONASE) 50 MCG/ACT nasal spray USE TWO SPRAYS IN EACH NOSTRIL DAILY   ? furosemide (LASIX) 40 MG tablet Take 1 tablet (40 mg total) by mouth daily as needed. For edema   ? gabapentin (NEURONTIN) 600 MG tablet Take 1,200 mg by mouth 3 (three) times daily.   ? hydrOXYzine (VISTARIL) 25 MG capsule TAKE ONE CAPSULE EVERY EIGHT HOURS AS NEEDED FOR ANXIETY 02/22/2021: QPM  ? ipratropium (ATROVENT) 0.06 % nasal spray PLACE 2 SPRAYS INTO BOTH NOSTRILS 3 TIMES DAILY AS NEEDED   ? lamoTRIgine (LAMICTAL) 200 MG tablet Take 1 tablet (200 mg total) by mouth daily.   ? letrozole (FEMARA) 2.5 MG tablet Take 1 tablet (2.5 mg total) by mouth daily.   ? montelukast (SINGULAIR) 10 MG tablet TAKE ONE TABLET BY MOUTH AT BEDTIME   ? morphine (MSIR) 15 MG tablet Take 1 tablet by mouth every 4 (four) hours as needed. Every 4-6 hours prn 02/22/2021: 1 QAM  ? Multiple Vitamins-Minerals (MULTIVITAMIN) tablet Take 1 tablet by mouth daily. (Patient not taking: Reported on 04/04/2021)   ?  nebivolol (BYSTOLIC) 5 MG tablet Take 1 tablet (5 mg total) by mouth daily.   ? polyethylene glycol (MIRALAX) 17 g packet Take 17-34 g by mouth daily as needed.   ? Pyridoxine HCl (VITAMIN B-6 PO) Take by mouth.   ? SENEXON-S 8.6-50 MG tablet TAKE 1-2 TABLETS BY MOUTH DAILY AS NEEDED FOR MILD CONSTIPATION.   ? telmisartan (MICARDIS) 80 MG tablet Take 1 tablet (80 mg total) by mouth daily.   ? Tiotropium Bromide-Olodaterol (STIOLTO RESPIMAT) 2.5-2.5 MCG/ACT AERS Inhale 2 puffs into the lungs  daily. (Patient not taking: Reported on 04/04/2021)   ? triamcinolone cream (KENALOG) 0.1 % Apply 1 application topically 2 (two) times daily. Prn legs itching   ? ?No facility-administered encounter medications on file as of 05/24/2021.  ? ? ?Patient Active Problem List  ? Diagnosis Date Noted  ? Carotid stenosis 03/26/2021  ? Acute renal failure due to traumatic rhabdomyolysis ([place]) 02/17/2021  ? Spinal stenosis of lumbar region 12/20/2020  ? H/O cervical spine surgery 12/20/2020  ? Abnormal MRI, cervical spine 12/20/2020  ? Abnormal MRI, lumbar spine 12/20/2020  ? Arthritis of neck 12/20/2020  ? Arthritis, lumbar spine 12/20/2020  ? Abnormal gait 12/20/2020  ? Frequent falls 12/20/2020  ? Cervical radiculopathy 12/20/2020  ? Numbness of fingers of both hands 12/20/2020  ? Carotid bruit present 10/06/2020  ? Noncompliance with treatment plan 09/16/2020  ? Polyp of colon 09/04/2020  ? Intrahepatic bile duct dilation 09/04/2020  ? Adrenal adenoma, left 09/04/2020  ? Atherosclerosis of renal artery ([place]) 09/04/2020  ? Elevated brain natriuretic peptide (BNP) level 08/09/2020  ? MDD (major depressive disorder), recurrent, in full remission ([place]) 07/28/2020  ? Thoracic spine pain 07/28/2020  ? MDD (major depressive disorder), recurrent, in partial remission ([place]) 06/02/2020  ? Cervical spondylosis 05/24/2020  ? Aortic atherosclerosis ([place]) 05/02/2020  ? Tubular adenoma of colon 04/28/2020  ? High risk medication use 09/15/2019  ? At risk for long QT syndrome 01/13/2019  ? Chronic left shoulder pain 12/31/2018  ? Cervical postlaminectomy syndrome 10/20/2018  ? Alcohol use disorder, mild, abuse 09/16/2018  ? Pulmonary emphysema ([place]) 01/22/2018  ? HTN (hypertension) 05/06/2017  ? HLD (hyperlipidemia) 05/02/2017  ? Osteopenia 05/01/2017  ? Vitamin D deficiency 01/27/2017  ? Chronic pain syndrome 01/27/2017  ? Chronic insomnia 01/27/2017  ? Tobacco use disorder 01/27/2017  ? Malignant neoplasm of lower-outer quadrant of left  breast of female, estrogen receptor positive ([place]) 04/20/2016  ? Alcohol use 02/07/2016  ? Cervical post-laminectomy syndrome 09/06/2015  ? Lumbar radiculopathy 10/31/2012  ? Neuropathy 12/31/2006  ? ? ?Conditions to be addressed/monitored: Depression; Mental Health Concerns  ? ?Care Plan : General Social Work (Adult)  ?Updates made by Vern Claude, LCSW since 05/24/2021 12:00 AM  ?  ? ?Problem: [facility] AMB "PATIENT-SPECIFIC PROBLEM"   ?Note:   ?                                            ? ?  ?Current Barriers:  ?Knowledge deficits related to accessing mental health provider in patient with Depression  ?Patient has a history of experiencing symptoms of  depression which seem to be exacerbated by the loss of her daughter.     ?Patient needs Support, Education, and Care Coordination in order to meet unmet mental health needs  ?Mental Health Concerns  ?  ?Clinical Social Work Goal(s):  ?  Over the next 90 days, patient will work with SW monthly by telephone or in person to ensure management of  symptoms of depression until connected for ongoing counseling resources of needed  ?Patient will implement clinical interventions discussed today to decreases symptoms of depression and increase knowledge and/or ability of: self-management skills. ?Interventions:  ?Assessed patient's understanding, education, previous treatment and care coordination needs  ?Provided basic mental health support, education and interventions  ?Patient confirmed re-starting  anti-depressant medications seems to be going well-depressive symptoms slowly improving ?Discussed opportunity for a new friendship, patient's feeling regarding anticipated relationship processed, safety, self care and healthy boundaries emphasized  ?Followed up on participation in  long term counseling-Patient continues to decline outpatient therapy but will continue to consider ?Patient denies drinking excessively, continues to decline need for resources in this  area ?Collaborated with appropriate clinical care team members regarding patient needs via chart review and attestation ?Other interventions include:  ?Verbalization of feelings encouraged, patient provided with validat

## 2021-05-24 NOTE — Patient Instructions (Addendum)
Visit Information ? ?Thank you for taking time to visit with me today. Please don't hesitate to contact me if I can be of assistance to you before our next scheduled telephone appointment. ? ?Following are the goals we discussed today:  ? ? ?- check out counseling-continue to consider mental health counseling if needed in the future ?-continue to  ?Our next appointment is by telephone on 06/27/21 at 4:00pm ? ?Please call the care guide team at [phone] if you need to cancel or reschedule your appointment.  ? ?If you are experiencing a Mental Health or [place] or need someone to talk to, please call the Suicide and Crisis Lifeline: 988  ? ?Patient verbalizes understanding of instructions and care plan provided today and agrees to view in [place]. Active MyChart status confirmed with patient.   ? ?Telephone follow up appointment with care management team member scheduled for: 06/27/21 ? ?Veora Fonte, LCSW ?[place] ?[phone] ? ?

## 2021-05-26 ENCOUNTER — Other Ambulatory Visit: Payer: Self-pay | Admitting: Internal Medicine

## 2021-06-02 ENCOUNTER — Other Ambulatory Visit: Payer: Self-pay | Admitting: Internal Medicine

## 2021-06-05 ENCOUNTER — Other Ambulatory Visit: Payer: Self-pay | Admitting: Internal Medicine

## 2021-06-07 ENCOUNTER — Telehealth: Payer: Self-pay | Admitting: *Deleted

## 2021-06-07 ENCOUNTER — Telehealth: Payer: PPO

## 2021-06-07 NOTE — Telephone Encounter (Signed)
? ?  06/07/2021 ? ?Ashley Cross ?1955-12-06 ?[id number] ? ?Phone call to patient for scheduled follow up appointment. Appointment had to be re-scheduled due to her work schedule. Appointment rescheduled for 06/14/21 at 4pm. ? ?[employer], LCSW ?[place] ?[phone] ? ? ?

## 2021-06-14 ENCOUNTER — Telehealth: Payer: Self-pay | Admitting: *Deleted

## 2021-06-14 ENCOUNTER — Telehealth: Payer: PPO | Admitting: *Deleted

## 2021-06-14 NOTE — Telephone Encounter (Signed)
?  Care Management  ? ?Follow Up Note ? ? ?06/14/2021 ?Name: Ashley Cross MRN: [id number] DOB: [birth year] ? ? ?Referred by: McLean-Scocuzza, Nino Glow, MD ?Reason for referral : Care Coordination ? ? ?An unsuccessful telephone outreach was attempted today. The patient was referred to the case management team for assistance with care management and care coordination.  ? ?Follow Up Plan: Telephone follow up appointment with care management team member scheduled for: 06/21/21 ? ? ?Sangeeta Youse, LCSW ?Clinical Social Worker  ?[facility] ?[phone] ? ? ?

## 2021-06-26 ENCOUNTER — Other Ambulatory Visit: Payer: Self-pay | Admitting: Internal Medicine

## 2021-06-26 DIAGNOSIS — I10 Essential (primary) hypertension: Secondary | ICD-10-CM

## 2021-06-28 ENCOUNTER — Other Ambulatory Visit: Payer: Self-pay | Admitting: Internal Medicine

## 2021-07-06 DIAGNOSIS — M5416 Radiculopathy, lumbar region: Secondary | ICD-10-CM | POA: Diagnosis not present

## 2021-07-27 ENCOUNTER — Other Ambulatory Visit: Payer: Self-pay | Admitting: Internal Medicine

## 2021-07-27 ENCOUNTER — Other Ambulatory Visit: Payer: Self-pay | Admitting: Family

## 2021-07-27 DIAGNOSIS — R0981 Nasal congestion: Secondary | ICD-10-CM

## 2021-07-27 DIAGNOSIS — R0982 Postnasal drip: Secondary | ICD-10-CM

## 2021-07-28 ENCOUNTER — Other Ambulatory Visit: Payer: Self-pay | Admitting: Internal Medicine

## 2021-07-28 DIAGNOSIS — K5900 Constipation, unspecified: Secondary | ICD-10-CM

## 2021-08-03 DIAGNOSIS — M5416 Radiculopathy, lumbar region: Secondary | ICD-10-CM | POA: Diagnosis not present

## 2021-08-03 DIAGNOSIS — M961 Postlaminectomy syndrome, not elsewhere classified: Secondary | ICD-10-CM | POA: Diagnosis not present

## 2021-08-03 DIAGNOSIS — F1120 Opioid dependence, uncomplicated: Secondary | ICD-10-CM | POA: Diagnosis not present

## 2021-08-03 DIAGNOSIS — G894 Chronic pain syndrome: Secondary | ICD-10-CM | POA: Diagnosis not present

## 2021-08-24 ENCOUNTER — Other Ambulatory Visit: Payer: Self-pay | Admitting: Internal Medicine

## 2021-09-01 ENCOUNTER — Ambulatory Visit (INDEPENDENT_AMBULATORY_CARE_PROVIDER_SITE_OTHER): Payer: PPO | Admitting: Internal Medicine

## 2021-09-01 ENCOUNTER — Telehealth: Payer: Self-pay | Admitting: Internal Medicine

## 2021-09-01 ENCOUNTER — Encounter: Payer: Self-pay | Admitting: Internal Medicine

## 2021-09-01 VITALS — BP 138/70 | HR 74 | Temp 98.2°F | Ht 64.0 in | Wt 126.8 lb

## 2021-09-01 DIAGNOSIS — J449 Chronic obstructive pulmonary disease, unspecified: Secondary | ICD-10-CM

## 2021-09-01 DIAGNOSIS — I10 Essential (primary) hypertension: Secondary | ICD-10-CM | POA: Diagnosis not present

## 2021-09-01 DIAGNOSIS — Z23 Encounter for immunization: Secondary | ICD-10-CM

## 2021-09-01 DIAGNOSIS — K5900 Constipation, unspecified: Secondary | ICD-10-CM

## 2021-09-01 DIAGNOSIS — J309 Allergic rhinitis, unspecified: Secondary | ICD-10-CM

## 2021-09-01 DIAGNOSIS — R220 Localized swelling, mass and lump, head: Secondary | ICD-10-CM | POA: Diagnosis not present

## 2021-09-01 DIAGNOSIS — W19XXXA Unspecified fall, initial encounter: Secondary | ICD-10-CM

## 2021-09-01 DIAGNOSIS — M542 Cervicalgia: Secondary | ICD-10-CM | POA: Diagnosis not present

## 2021-09-01 DIAGNOSIS — M5412 Radiculopathy, cervical region: Secondary | ICD-10-CM

## 2021-09-01 MED ORDER — POLYETHYLENE GLYCOL 3350 17 G PO PACK: 17.0000 g | PACK | Freq: Every day | ORAL | 11 refills | Status: DC | PRN

## 2021-09-01 MED ORDER — BUPROPION HCL ER (XL) 150 MG PO TB24
150.0000 mg | ORAL_TABLET | Freq: Every day | ORAL | 3 refills | Status: DC
Start: 2021-09-01 — End: 2022-07-27

## 2021-09-01 MED ORDER — MONTELUKAST SODIUM 10 MG PO TABS: 10.0000 mg | ORAL_TABLET | Freq: Every day | ORAL | 3 refills | Status: DC

## 2021-09-01 MED ORDER — DULOXETINE HCL 60 MG PO CPEP
60.0000 mg | ORAL_CAPSULE | Freq: Every day | ORAL | 3 refills | Status: DC
Start: 2021-09-01 — End: 2022-09-25

## 2021-09-01 MED ORDER — FUROSEMIDE 40 MG PO TABS: 40.0000 mg | ORAL_TABLET | Freq: Every day | ORAL | 3 refills | Status: DC | PRN

## 2021-09-01 MED ORDER — SENNOSIDES-DOCUSATE SODIUM 8.6-50 MG PO TABS: ORAL_TABLET | ORAL | 3 refills | Status: DC

## 2021-09-01 MED ORDER — NEBIVOLOL HCL 5 MG PO TABS: 5.0000 mg | ORAL_TABLET | Freq: Every day | ORAL | 3 refills | Status: DC

## 2021-09-01 MED ORDER — PREVNAR 20 0.5 ML IM SUSY: 0.5000 mL | PREFILLED_SYRINGE | INTRAMUSCULAR | 0 refills | Status: AC

## 2021-09-01 MED ORDER — METHYLPREDNISOLONE ACETATE 40 MG/ML IJ SUSP
40.0000 mg | Freq: Once | INTRAMUSCULAR | Status: AC
  Administered 2021-09-01: 40 mg via INTRAMUSCULAR

## 2021-09-01 MED ORDER — ALBUTEROL SULFATE HFA 108 (90 BASE) MCG/ACT IN AERS: INHALATION_SPRAY | RESPIRATORY_TRACT | 12 refills | Status: DC

## 2021-09-01 NOTE — Patient Instructions (Addendum)
Dr. Volanda Napoleon new PCP   Please call to get mammogram scheduled     Dr. Barry Dienes surgery she also comes to [facility] or [facility]  Phone Fax E-mail Address  [phone] Not available [address]   [place] [address]     Specialties     General Surgery      [facility] [place]   [place], [place] [address]   [phone]   Saunders Revel, MD   [address]   [id number] [employer]   [place], [place] [address]   [phone]   [phone] (Fax)   Polyp of colon  Pneumococcal Conjugate Vaccine (Prevnar 20) Suspension for Injection What is this medication? PNEUMOCOCCAL VACCINE (NEU mo KOK al vak SEEN) is a vaccine. It prevents pneumococcus bacterial infections. These bacteria can cause serious infections like pneumonia, meningitis, and blood infections. This vaccine will not treat an infection and will not cause infection. This vaccine is recommended for adults 18 years and older. This medicine may be used for other purposes; ask your health care provider or pharmacist if you have questions. COMMON BRAND NAME(S): Prevnar 20 What should I tell my care team before I take this medication? They need to know if you have any of these conditions: bleeding disorder fever immune system problems an unusual or allergic reaction to pneumococcal vaccine, diphtheria toxoid, other vaccines, other medicines, foods, dyes, or preservatives pregnant or trying to get pregnant breast-feeding How should I use this medication? This vaccine is injected into a muscle. It is given by a health care provider. A copy of Vaccine Information Statements will be given before each vaccination. Be sure to read this information carefully each time. This sheet may change often. Talk to your health care provider about the use of this medicine in children. Special care may be needed. Overdosage: If you think you have taken too much of this  medicine contact a poison control center or emergency room at once. NOTE: This medicine is only for you. Do not share this medicine with others. What if I miss a dose? This does not apply. This medicine is not for regular use. What may interact with this medication? medicines for cancer chemotherapy medicines that suppress your immune function steroid medicines like prednisone or cortisone This list may not describe all possible interactions. Give your health care provider a list of all the medicines, herbs, non-prescription drugs, or dietary supplements you use. Also tell them if you smoke, drink alcohol, or use illegal drugs. Some items may interact with your medicine. What should I watch for while using this medication? Mild fever and pain should go away in 3 days or less. Report any unusual symptoms to your health care provider. What side effects may I notice from receiving this medication? Side effects that you should report to your doctor or health care professional as soon as possible: allergic reactions (skin rash, itching or hives; swelling of the face, lips, or tongue) confusion fast, irregular heartbeat fever over 102 degrees F muscle weakness seizures trouble breathing unusual bruising or bleeding Side effects that usually do not require medical attention (report to your doctor or health care professional if they continue or are bothersome): fever of 102 degrees F or less headache joint pain muscle cramps, pain pain, tender at site where injected This list may not describe all possible side effects. Call your doctor for medical advice about  side effects. You may report side effects to FDA at 1-800-FDA-1088. Where should I keep my medication? This vaccine is only given by a health care provider. It will not be stored at home. NOTE: This sheet is a summary. It may not cover all possible information. If you have questions about this medicine, talk to your doctor, pharmacist, or  health care provider.  2023 Elsevier/Gold Standard (2019-10-08 00:00:00)

## 2021-09-01 NOTE — Telephone Encounter (Signed)
Lft pt vm to call ofc to sch two CT's. thanks

## 2021-09-01 NOTE — Progress Notes (Signed)
Chief Complaint  Patient presents with   Follow-up    5 month f/u   F/u  1. Htn improved on bystolic 5 mg qd lasix 40 micardis 80 mg qd  2. Fall Tuesday and yesterday/today had numbness and tingling down both arms s/p neck surgery years ago with these sxs off and on anyway she fell on right side of face with right cheek swelling and pain and now neck pain and f/u spine center/pain clinic in 1 month she declines imaging today will call back Monday. She fell on right side of face hitting her right cheek hard fall on piece of furniture pain 7-8/10 tried to ice swelling to right cheek gone down some but completely     Review of Systems  Constitutional:  Negative for weight loss.  HENT:  Negative for hearing loss.   Eyes:  Negative for blurred vision.  Respiratory:  Negative for shortness of breath.   Cardiovascular:  Negative for chest pain.  Gastrointestinal:  Negative for abdominal pain and blood in stool.  Genitourinary:  Negative for dysuria.  Musculoskeletal:  Negative for falls and joint pain.  Skin:  Negative for rash.  Neurological:  Negative for headaches.  Psychiatric/Behavioral:  Negative for depression.    Past Medical History:  Diagnosis Date   Anxiety    Breast cancer ([place])    Left breast(nipple mass) 2018   Cervical stenosis of spine    Chicken pox    Chronic back pain    Colon polyps    COPD (chronic obstructive pulmonary disease) (HCC)    Cyst of left nipple    Depression    Hypertension    Insomnia    Personal history of radiation therapy    Past Surgical History:  Procedure Laterality Date   BACK SURGERY     x 3 (2010 x 2; 2013 x 1)  Dr. Ronnald Ramp   BREAST BIOPSY Left    core- neg   BREAST LUMPECTOMY Left 2018   BREAST SURGERY Left    breast biopsy   CERVICAL CONE BIOPSY     CERVICAL FUSION     times 2   COLONOSCOPY WITH PROPOFOL N/A 04/18/2015   Procedure: COLONOSCOPY WITH PROPOFOL;  Surgeon: [name], MD;  [facility] ENDOSCOPY;   Service: Endoscopy;  Laterality: N/A;   ELBOW SURGERY     2014 b/l elbows per pt ulnar nerve    ELBOW SURGERY     x2    EPIDURAL BLOCK INJECTION     Dr. Maryjean Ka    MASS EXCISION Left 04/12/2016   Procedure: EXCISION OF LEFT NIPPLE MASS;  Surgeon: Stark Klein, MD;  Location: [place];  Service: General;  Laterality: Left;   NECK SURGERY     POSTERIOR CERVICAL FUSION/FORAMINOTOMY  03/07/2011   Procedure: POSTERIOR CERVICAL FUSION/FORAMINOTOMY LEVEL 4;  Surgeon: Eustace Moore, MD;  Location: [place] NEURO ORS;  Service: Neurosurgery;  Laterality: N/A;  cervical three to thoracic one posterior cervical fusion    SENTINEL NODE BIOPSY Left 05/29/2016   Procedure: LEFT SENTINEL LYMPH NODE BIOPSY;  Surgeon: Stark Klein, MD;  Location: [place];  Service: General;  Laterality: Left;   TONSILLECTOMY     Family History  Problem Relation Age of Onset   Breast cancer Mother 17   Hypertension Mother    Cancer Mother        breast dx'ed 30    Cancer Father        prostate   Hypertension Father  Drug abuse Daughter    Hypertension Maternal Grandmother    Breast cancer Cousin    Social History   Socioeconomic History   Marital status: Divorced    Spouse name: Not on file   Number of children: 2   Years of education: Not on file   Highest education level: Not on file  Occupational History   Not on file  Tobacco Use   Smoking status: Every Day    Packs/day: 0.25    Years: 30.00    Total pack years: 7.50    Types: Cigarettes   Smokeless tobacco: Never   Tobacco comments:    work days - 6 cigarettes; weekends - ~10 - reported on 10/13/2020  Vaping Use   Vaping Use: Every day  Substance and Sexual Activity   Alcohol use: Yes    Comment: weekends and some today   Drug use: No    Comment: on opana since jan 2018   Sexual activity: Not on file  Other Topics Concern   Not on file  Social History Narrative   As of 01/25/17 not sexually active of in relationship    Divorced    2 kids son and daughter (though daughter died of suicide in 04/23/2017)   Son lives in [place] now as of 12/2018       Disability    Loves animals has 3 dogs           Social Determinants of Health   Financial Resource Strain: Low Risk  (02/22/2021)   Overall Financial Resource Strain (CARDIA)    Difficulty of Paying Living Expenses: Not very hard  Recent Concern: Financial Resource Strain - Medium Risk (11/30/2020)   Overall Financial Resource Strain (CARDIA)    Difficulty of Paying Living Expenses: Somewhat hard  Food Insecurity: No Food Insecurity (03/29/2021)   Hunger Vital Sign    Worried About [place] in the Last Year: Never true    Ran Out of Food in the Last Year: Never true  Transportation Needs: No Transportation Needs (03/29/2021)   PRAPARE - Hydrologist (Medical): No    Lack of Transportation (Non-Medical): No  Physical Activity: Inactive (03/29/2021)   Exercise Vital Sign    Days of Exercise per Week: 0 days    Minutes of Exercise per Session: 0 min  Stress: Stress Concern Present (04/20/2020)   [place]    Feeling of Stress : Rather much  Social Connections: Socially Isolated (03/29/2021)   Social Connection and Isolation Panel [NHANES]    Frequency of Communication with Friends and Family: More than three times a week    Frequency of Social Gatherings with Friends and Family: Three times a week    Attends Religious Services: Never    Active Member of Clubs or Organizations: No    Attends Archivist Meetings: Never    Marital Status: Divorced  Human resources officer Violence: Not At Risk (04/20/2020)   Humiliation, Afraid, Rape, and Kick questionnaire    Fear of Current or Ex-Partner: No    Emotionally Abused: No    Physically Abused: No    Sexually Abused: No   Current Meds  Medication Sig   albuterol (VENTOLIN HFA) 108 (90 Base) MCG/ACT inhaler  INHALE ONE (1) TO TWO (2) PUFFS INTO THELUNGS EVERY 4 HOURS AS NEEDED FOR WHEEZING OR SHORTNESS OF BREATH   buPROPion (WELLBUTRIN XL) 150 MG 24 hr tablet TAKE  1 TABLET BY MOUTH ONCE DAILY   Cholecalciferol (VITAMIN D3 PO) Take by mouth.   Cyanocobalamin (VITAMIN B-12 PO) Take by mouth.   cyclobenzaprine (FLEXERIL) 10 MG tablet Take 10 mg by mouth every 8 (eight) hours as needed.   DULoxetine (CYMBALTA) 60 MG capsule TAKE 1 CAPSULE BY MOUTH ONCE DAILY   feeding supplement (ENSURE ENLIVE / ENSURE PLUS) LIQD Take 237 mLs by mouth 2 (two) times daily between meals.   fluticasone (FLONASE) 50 MCG/ACT nasal spray USE TWO SPRAYS IN EACH NOSTRIL DAILY   furosemide (LASIX) 40 MG tablet Take 1 tablet (40 mg total) by mouth daily as needed. For edema   gabapentin (NEURONTIN) 600 MG tablet Take 1,200 mg by mouth 3 (three) times daily.   hydrOXYzine (VISTARIL) 25 MG capsule TAKE ONE CAPSULE EVERY EIGHT HOURS AS NEEDED FOR ANXIETY   ipratropium (ATROVENT) 0.06 % nasal spray PLACE 2 SPRAYS INTO BOTH NOSTRILS 3 TIMES DAILY AS NEEDED   lamoTRIgine (LAMICTAL) 200 MG tablet Take 1 tablet (200 mg total) by mouth daily.   letrozole (FEMARA) 2.5 MG tablet Take 1 tablet (2.5 mg total) by mouth daily.   montelukast (SINGULAIR) 10 MG tablet TAKE ONE TABLET BY MOUTH AT BEDTIME   morphine (MSIR) 15 MG tablet Take 1 tablet by mouth every 4 (four) hours as needed. Every 4-6 hours prn   Multiple Vitamins-Minerals (MULTIVITAMIN) tablet Take 1 tablet by mouth daily.   nebivolol (BYSTOLIC) 5 MG tablet Take 1 tablet (5 mg total) by mouth daily.   pneumococcal 20-valent conjugate vaccine (PREVNAR 20) 0.5 ML injection Inject 0.5 mLs into the muscle tomorrow at 10 am for 1 dose.   polyethylene glycol (MIRALAX) 17 g packet Take 17-34 g by mouth daily as needed.   Pyridoxine HCl (VITAMIN B-6 PO) Take by mouth.   SENEXON-S 8.6-50 MG tablet TAKE 1-2 TABLETS BY MOUTH DAILY AS NEEDED FOR MILD CONSTIPATION.   telmisartan (MICARDIS) 80 MG  tablet TAKE ONE TABLET BY MOUTH EVERY DAY   Tiotropium Bromide-Olodaterol (STIOLTO RESPIMAT) 2.5-2.5 MCG/ACT AERS Inhale 2 puffs into the lungs daily.   triamcinolone cream (KENALOG) 0.1 % Apply 1 application topically 2 (two) times daily. Prn legs itching   Allergies  Allergen Reactions   Anastrozole    Augmentin [Amoxicillin-Pot Clavulanate]     Diarrhea    Norvasc [Amlodipine]     Swelling    Topamax [Topiramate]     Didn't like the way made feel more irritatble/sad    Sulfa Antibiotics Itching and Rash   Sulfonamide Derivatives Itching and Rash   No results found for this or any previous visit (from the past 2160 hour(s)). Objective  Body mass index is 21.77 kg/m. Wt Readings from Last 3 Encounters:  09/01/21 126 lb 12.8 oz (57.5 kg)  04/21/21 130 lb (59 kg)  04/04/21 130 lb (59 kg)   Temp Readings from Last 3 Encounters:  09/01/21 98.2 F (36.8 C) (Oral)  04/04/21 98.5 F (36.9 C) (Oral)  03/03/21 98.5 F (36.9 C) (Oral)   BP Readings from Last 3 Encounters:  09/01/21 138/70  04/04/21 124/66  03/03/21 (!) 162/76   Pulse Readings from Last 3 Encounters:  09/01/21 74  04/04/21 88  03/03/21 76    Physical Exam Vitals and nursing note reviewed.  Constitutional:      Appearance: Normal appearance. She is well-developed and well-groomed.  HENT:     Head: Normocephalic and atraumatic.   Eyes:     Conjunctiva/sclera: Conjunctivae normal.  Pupils: Pupils are equal, round, and reactive to light.  Cardiovascular:     Rate and Rhythm: Normal rate and regular rhythm.     Heart sounds: Normal heart sounds. No murmur heard. Pulmonary:     Effort: Pulmonary effort is normal.     Breath sounds: Normal breath sounds.  Abdominal:     General: Abdomen is flat. Bowel sounds are normal.     Tenderness: There is no abdominal tenderness.  Musculoskeletal:        General: No tenderness.  Skin:    General: Skin is warm and dry.  Neurological:     General: No focal  deficit present.     Mental Status: She is alert and oriented to person, place, and time. Mental status is at baseline.     Cranial Nerves: Cranial nerves 2-12 are intact.     Motor: Motor function is intact.     Coordination: Coordination is intact.     Gait: Gait is intact.  Psychiatric:        Attention and Perception: Attention and perception normal.        Mood and Affect: Mood and affect normal.        Speech: Speech normal.        Behavior: Behavior normal. Behavior is cooperative.        Thought Content: Thought content normal.        Cognition and Memory: Cognition and memory normal.        Judgment: Judgment normal.     Assessment  Plan  Cervicalgia - Plan: CT CERVICAL SPINE WO CONTRAST pt will call back Monday if desired otherwise f/u with pain/spine clinic [facility] in [place] in 1 month pain management s/p back surgery   Right facial swelling - Plan: CT Maxillofacial WO CM, will try ice and call back if imaging desired  Cheek swelling - Plan: CT Maxillofacial WO CM  Fall, initial encounter 08/29/21 - Plan: CT Maxillofacial WO CM, CT CERVICAL SPINE WO CONTRAST  Cervical radiculopathy acute on chronic - Plan: CT CERVICAL SPINE WO CONTRAST, methylPREDNISolone acetate (DEPO-MEDROL) injection 40 mg x 1  HTN improved  Cont meds telmisartan 80 mg qd, lasix 40 x 1 and bystolic 5 mg qd   HM Flu shot utd  Tdap UTD  pna 23 utd Prevnar 20 will get at her pharmacy given Rx 09/01/21  covid shot 3/3 1/2 shingrix check date    Pap 06/12/19 neg neg HPV   breast exam 06/12/19 normal  [place] sch 05/18/20 and mammo with lymph swelling and pain   05/18/20 mammo neg call and schedule for 05/2021 past due   mammo with h/o left breast cancer  04/12/16 invasive ductal ca grade 1 0.8 cm  -h/o breast cancer left  -letrozole likely causing sweating/hot flashes she will f/u with h/o in 08/2018  06/08/19 negative mammogram had f/u [name] 09/06/20 Dr. Barry Dienes surgery following as well   dexa 05/14/20  osteopenia T -2.0 rec calcium and vitamin D  repeat in 3-5 years    Colonoscopy had 04/18/15 see report tubular adenomas repeat in 5 years [facility]  06/20/20 or 6/22 wants to go back [facility] Dr. Ivor Messier since she had a polyp removed in a difficult area on he did this -referred [facility] 04/2020 for f/u 06/2020 not seen as of 12/07/20 and as of 03/2021 referred again pt still not seen as of 09/01/21 she had difficult to remove polyp and needs to go to [facility] per  pt transportation is an issue but she is established here and above mentioned   Hep C neg 01/20/16    Ct ab pelvis 08/26/20  IMPRESSION: 1. No acute abdominal/pelvic findings, mass lesions or adenopathy. 2. Stable left adrenal gland adenoma. 3. Mild central intrahepatic and common bile duct dilatation without cause. Recommend correlation with liver function studies. 4. Aortic atherosclerosis + left renal artery +   Aortic Atherosclerosis (ICD10-I70.0).   Rec healthy diet and exercise    Provider: Dr. Olivia Mackie McLean-Scocuzza-Internal Medicine

## 2021-09-04 ENCOUNTER — Telehealth: Payer: Self-pay | Admitting: Psychiatry

## 2021-09-04 NOTE — Progress Notes (Signed)
Phone message and MyChart message sent requesting call back to schedule appointment with Dr. Shea Evans.

## 2021-09-04 NOTE — Telephone Encounter (Signed)
Left voicemail for patient requesting call back to schedule appointment per request of PCP via covering provider.

## 2021-09-06 ENCOUNTER — Telehealth: Payer: Self-pay | Admitting: Internal Medicine

## 2021-09-06 NOTE — Telephone Encounter (Signed)
Lft pt vm to call ofc to sch CT's. thanks

## 2021-09-20 ENCOUNTER — Telehealth: Payer: Self-pay | Admitting: Hematology and Oncology

## 2021-09-20 NOTE — Telephone Encounter (Signed)
Per 8/16 phone line pt called to change appointment to video visit.  Seba Dalkai per nurse

## 2021-09-20 NOTE — Progress Notes (Signed)
HEMATOLOGY-ONCOLOGY [place] VISIT PROGRESS NOTE  I connected with Ashley Cross on 09/21/2021 at 11:30 AM EDT by Mychart video conference and verified that I am speaking with the correct person using two identifiers.  I discussed the limitations, risks, security and privacy concerns of performing an evaluation and management service by Webex and the availability of in person appointments.  I also discussed with the patient that there may be a patient responsible charge related to this service. The patient expressed understanding and agreed to proceed.  Patient's Location: Home Physician Location: Clinic  CHIEF COMPLIANT: Left breast cancer currently on letrozole.  INTERVAL HISTORY: Ashley Cross is a 66 y.o. female with above-mentioned history of left breast cancer treated with lumpectomy, radiation, and who is currently on anti-estrogen therapy with letrozole. She presents to the clinic today for a MyChart follow-up.   Oncology History  Malignant neoplasm of lower-outer quadrant of left breast of female, estrogen receptor positive ([place])  04/12/2016 Initial Diagnosis   Left breast biopsy/ lumpectomy: IDC grade 1, 0.8 cm with DCIS grade 1, margins negative, ER 100%, PR 100%, HER-2 negative, Ki-67 15%, T1b NX stage I a   05/21/2016 PET scan   Mild focal hypermetabolic in the left breast, no findings of metastatic disease. mild hypermetabolism is along the right costosternal junction favoring degenerative changes posttraumatic changes involving the right inferior scapular right sacrum bilateral pelvis and right lateral third and fourth ribs   05/29/2016 Surgery   1/4 sentinel lymph node positive with extracapsular extension    06/01/2016 Miscellaneous   Mammaprint low-risk: Average 10 year risk of recurrence 10%; Mammaprint index +0.389   07/24/2016 - 09/07/2016 Radiation Therapy   Adjuvant radiation therapy   09/2016 -  Anti-estrogen oral therapy   Anastrozole, changed to Letrozole  after 1-2 months (patient unable to tolerate Anastrozole)     REVIEW OF SYSTEMS:   Constitutional: Denies fevers, chills or abnormal weight loss Eyes: Denies blurriness of vision Ears, nose, mouth, throat, and face: Denies mucositis or sore throat Respiratory: Denies cough, dyspnea or wheezes Cardiovascular: Denies palpitation, chest discomfort Gastrointestinal:  Denies nausea, heartburn or change in bowel habits Skin: Denies abnormal skin rashes Lymphatics: Denies new lymphadenopathy or easy bruising Neurological:Denies numbness, tingling or new weaknesses Behavioral/Psych: Mood is stable, no new changes  Extremities: No lower extremity edema Breast: denies any pain or lumps or nodules in either breasts All other systems were reviewed with the patient and are negative.  Observations/Objective:  There were no vitals filed for this visit. There is no height or weight on file to calculate BMI.  I have reviewed the data as listed    Latest Ref Rng & Units 03/03/2021   10:04 AM 02/18/2021    4:32 AM 02/16/2021    9:00 PM  CMP  Glucose 70 - 99 mg/dL 109  106  122   BUN 6 - 23 mg/dL 9  15  70   Creatinine 0.40 - 1.20 mg/dL 0.79  0.56  1.34   Sodium 135 - 145 mEq/L 139  137  134   Potassium 3.5 - 5.1 mEq/L 4.4  3.6  4.9   Chloride 96 - 112 mEq/L 102  106  97   CO2 19 - 32 mEq/L 30  26  22    Calcium 8.4 - 10.5 mg/dL 9.6  8.8  9.1   Total Protein 6.0 - 8.3 g/dL 7.0  5.9  7.7   Total Bilirubin 0.2 - 1.2 mg/dL 0.4  1.2  2.0  Alkaline Phos 39 - 117 U/L 98  91  111   AST 0 - 37 U/L 17  169  415   ALT 0 - 35 U/L 18  137  204     Lab Results  Component Value Date   WBC 8.2 02/16/2021   HGB 11.9 (L) 02/16/2021   HCT 36.0 02/16/2021   MCV 93.0 02/16/2021   PLT 246 02/16/2021   NEUTROABS 6.0 11/17/2020      Assessment Plan:  Malignant neoplasm of lower-outer quadrant of left breast of female, estrogen receptor positive ([place]) 04/12/2016 Left breast biopsy/ lumpectomy: IDC grade  10.8 cm with DCIS grade 1, margins negative, ER 100%, PR 100%, HER-2 negative, Ki-67 15%   PET/CT scan: Negative for metastatic disease Sentinel lymph node study: 1/4 sentinel nodes positive with extracapsular extension   Mammaprint low-risk: 10 year risk of recurrence 10% Adjuvant radiation therapy 07/24/2016- 09/07/2016   Treatment plan: Adjuvant antiestrogen therapy with anastrozole 1 mg daily 7 years switched to letrozole December 2018   Letrozole toxicities: Very occasional hot flashes.  Denies any major trouble from this.     Breast cancer surveillance: 1.  Breast exam  Not done 2. mammogram  05/18/20: Palpable left axillary lump: Left mammogram and U/S: Unremarkable. Needs Bilateral mammograms. breast density category C she will call today and make an appointment to get her mammograms done.  3. MRI abdomen: Mild intrahepatic biliary duct dilatation, pancreatic pseudocyst   Her daughter passed away in 04-21-17 and she is still grieving from that.   Return to clinic in 1 year for follow-up    I discussed the assessment and treatment plan with the patient. The patient was provided an opportunity to ask questions and all were answered. The patient agreed with the plan and demonstrated an understanding of the instructions. The patient was advised to call back or seek an in-person evaluation if the symptoms worsen or if the condition fails to improve as anticipated.   I provided 12 minutes of face-to-face Web Ex time during this encounter.    Rulon Eisenmenger, MD 09/21/2021  I Gardiner Coins am scribing for [name]  I have reviewed the above documentation for accuracy and completeness, and I agree with the above.

## 2021-09-21 ENCOUNTER — Inpatient Hospital Stay: Payer: PPO | Attending: Hematology and Oncology | Admitting: Hematology and Oncology

## 2021-09-21 DIAGNOSIS — Z170 Estrogen receptor positive status [ER+]: Secondary | ICD-10-CM | POA: Diagnosis not present

## 2021-09-21 DIAGNOSIS — C50512 Malignant neoplasm of lower-outer quadrant of left female breast: Secondary | ICD-10-CM

## 2021-09-21 MED ORDER — LETROZOLE 2.5 MG PO TABS: 2.5000 mg | ORAL_TABLET | Freq: Every day | ORAL | 3 refills | Status: DC

## 2021-09-21 NOTE — Assessment & Plan Note (Addendum)
04/12/2016 Left breast biopsy/ lumpectomy: IDC grade 10.8 cm with DCIS grade 1, margins negative, ER 100%, PR 100%, HER-2 negative, Ki-67 15%  PET/CT scan: Negative for metastatic disease Sentinel lymph node study: 1/4 sentinel nodes positive with extracapsular extension  Mammaprint low-risk: 10 year risk of recurrence 10% Adjuvant radiation therapy 07/24/2016- 09/07/2016  Treatment plan: Adjuvant antiestrogen therapy with anastrozole 1 mg daily 7 yearsswitched to letrozole December 2018  Letrozole toxicities:Very occasional hot flashes. Denies any major trouble from this.   Breast cancer surveillance: 1.Breast exam Not done 2.mammogram4/13/22: Palpable left axillary lump: Left mammogram and U/S: Unremarkable. Needs Bilateral mammograms. breast density category C   3.MRI abdomen: Mild intrahepatic biliary duct dilatation, pancreatic pseudocyst  Her daughter passed away in 04-23-2017 she is still grieving from that.  Return to clinic in1 year for follow-up

## 2021-09-25 ENCOUNTER — Telehealth: Payer: Self-pay | Admitting: Hematology and Oncology

## 2021-09-25 NOTE — Telephone Encounter (Signed)
Scheduled appointment per 8/16 los. Left voicemail.

## 2021-10-03 ENCOUNTER — Ambulatory Visit: Payer: PPO

## 2021-10-12 ENCOUNTER — Ambulatory Visit
Admission: RE | Admit: 2021-10-12 | Discharge: 2021-10-12 | Disposition: A | Discharge status: Home / Self Care | Payer: PPO | Source: Ambulatory Visit | Attending: Internal Medicine | Admitting: Internal Medicine

## 2021-10-12 DIAGNOSIS — Z1231 Encounter for screening mammogram for malignant neoplasm of breast: Secondary | ICD-10-CM | POA: Diagnosis not present

## 2021-10-12 DIAGNOSIS — Z853 Personal history of malignant neoplasm of breast: Secondary | ICD-10-CM

## 2021-10-30 ENCOUNTER — Other Ambulatory Visit: Payer: Self-pay | Admitting: Neurology

## 2021-11-07 ENCOUNTER — Other Ambulatory Visit: Payer: Self-pay | Admitting: Psychiatry

## 2021-11-07 DIAGNOSIS — F331 Major depressive disorder, recurrent, moderate: Secondary | ICD-10-CM

## 2021-11-29 ENCOUNTER — Encounter: Payer: Self-pay | Admitting: Internal Medicine

## 2021-12-01 ENCOUNTER — Telehealth: Payer: Self-pay

## 2021-12-01 NOTE — Progress Notes (Signed)
[facility])  [place] Team    12/01/2021  Darina Hartwell 05/24/1955 [id number]  Reason for referral: Medication Assistance with Stiolto  Referral source:  [place] Technician Current insurance: [employer]  Outreach:  Unsuccessful telephone call with Ms. Starleen Blue.  Unable to verify HIPAA identifiers. Today was my second attempt, first attempt on 11/27/21 was also unsuccessful.   Subjective:  Patient referred for Patient Assistance Program (PAP) re-enrollment for Stiolto.     Plan: Will follow-up in 1-3 business days.  Thanks,  Reed Breech, [place] [phone]

## 2021-12-04 ENCOUNTER — Encounter (INDEPENDENT_AMBULATORY_CARE_PROVIDER_SITE_OTHER): Payer: Self-pay

## 2021-12-05 ENCOUNTER — Encounter: Payer: PPO | Admitting: Family Medicine

## 2021-12-05 ENCOUNTER — Telehealth: Payer: Self-pay

## 2021-12-05 NOTE — Progress Notes (Signed)
[facility])                                            [place] Team    12/05/2021  Ashley Cross July 25, 1955 [id number]  Reason for referral: Medication Assistance - Patient Assistance Program (PAP) 2024 Re-enrollment   The patient was outreached three times to address this referral. During each outreach, a HIPAA compliant voicemail was left requesting that the patient return my phone call. Today was my third attempt, so I will be closing this case.  [facility] pharmacy case is being closed due to the following reasons:  -Will close [place] case as I have been unable to engage or maintain contact with patient  [place] is happy to assist the patient/family in the future for clinical pharmacy needs, following a discussion from your team about [place] outreach. Thank you for allowing [facility] to be a part of your patient's care.   Thanks,  Reed Breech, [place] [phone]

## 2021-12-11 ENCOUNTER — Encounter: Payer: PPO | Admitting: Family Medicine

## 2022-01-02 ENCOUNTER — Telehealth: Payer: Self-pay | Admitting: Internal Medicine

## 2022-01-02 NOTE — Telephone Encounter (Signed)
Pt would like to be called on refilling a medication that was stopped in october-stiolto respimat inhaler. Pt want to be called regarding sending this to [place] assistance program-[phone] Fax [phone]

## 2022-01-03 ENCOUNTER — Telehealth: Payer: Self-pay

## 2022-01-03 NOTE — Telephone Encounter (Signed)
A fax was received on today in regards to the pts application to [place] patient Assistance Program for the medication:   Tiotropium Bromide-Olodaterol (STIOLTO RESPIMAT) 2.5-2.5 MCG/ACT AERS    It states they are unable to process the request due to the Rx being expired. They said we can fax a new Rx to [phone] or call pharmacy at [phone].

## 2022-01-03 NOTE — Telephone Encounter (Signed)
I printed the [place] Patient Assistance Program forms for the pt to fill out. I LMOM in regards to these forms being ready for pts pick up and also told her to give [place] a CB.

## 2022-01-04 ENCOUNTER — Telehealth: Payer: Self-pay | Admitting: Pharmacy Technician

## 2022-01-04 DIAGNOSIS — Z596 Low income: Secondary | ICD-10-CM

## 2022-01-04 NOTE — Progress Notes (Signed)
[facility])                                            [place] Team    01/04/2022  Devany Aja [birth year] [id number]                                      Medication Assistance Referral-FOR 2024 RE ENROLLMENT  Referral From: [employer]   Medication/Company: [employer] / [employer] Patient application portion:  Mailed Provider application portion: Faxed  to Dr. Carollee Leitz Provider address/fax verified via: Office website    Davianna Deutschman P. Maryann Mccall, [place]  [phone]

## 2022-01-04 NOTE — Telephone Encounter (Signed)
Noted will call pt to get scheduled.

## 2022-01-04 NOTE — Telephone Encounter (Signed)
LMOM for pt to CB to get scheduled.

## 2022-01-08 NOTE — Telephone Encounter (Signed)
Pt scheduled with Volanda Napoleon.

## 2022-01-09 DIAGNOSIS — M5416 Radiculopathy, lumbar region: Secondary | ICD-10-CM | POA: Diagnosis not present

## 2022-01-09 DIAGNOSIS — M961 Postlaminectomy syndrome, not elsewhere classified: Secondary | ICD-10-CM | POA: Diagnosis not present

## 2022-01-09 DIAGNOSIS — F1120 Opioid dependence, uncomplicated: Secondary | ICD-10-CM | POA: Diagnosis not present

## 2022-01-26 ENCOUNTER — Encounter: Payer: Self-pay | Admitting: *Deleted

## 2022-03-27 DIAGNOSIS — R200 Anesthesia of skin: Secondary | ICD-10-CM | POA: Diagnosis not present

## 2022-03-27 DIAGNOSIS — R202 Paresthesia of skin: Secondary | ICD-10-CM | POA: Diagnosis not present

## 2022-03-27 DIAGNOSIS — M961 Postlaminectomy syndrome, not elsewhere classified: Secondary | ICD-10-CM | POA: Diagnosis not present

## 2022-04-03 ENCOUNTER — Encounter: Payer: PPO | Admitting: Family Medicine

## 2022-04-03 DIAGNOSIS — M961 Postlaminectomy syndrome, not elsewhere classified: Secondary | ICD-10-CM | POA: Diagnosis not present

## 2022-04-03 DIAGNOSIS — Z7689 Persons encountering health services in other specified circumstances: Secondary | ICD-10-CM | POA: Diagnosis not present

## 2022-04-23 NOTE — Patient Instructions (Incomplete)
It was a pleasure meeting you today. Thank you for allowing me to take part in your health care.  Our goals for today as we discussed include:  Referral sent for Mammogram. Please call to schedule appointment. [facility] [address], [place] [address] [phone]   You have received pneumonia 20 vaccine  Recommend flu vaccine Recommend COVID booster  Please schedule Medicare annual wellness visit  Please schedule appointment with PCP for follow-up in 4 to 8 weeks.  Please bring in all of your medications at that time..  If you have any questions or concerns, please do not hesitate to call the office at [phone].  I look forward to our next visit and until then take care and stay safe.  Regards,   Carollee Leitz, MD   [facility]

## 2022-04-23 NOTE — Progress Notes (Signed)
SUBJECTIVE:   Chief Complaint  Patient presents with   Transitions Of Care   HPI Patient presents to clinic to transfer care.  No acute concerns today. Poor historian Not up-to-date on current medications.  Hypertension Asymptomatic.  Currently taking Bystolic 5 mg daily and Micardis 80 mg daily and tolerating well.  Was prescribed Lasix 40 mg for lower extremity edema.  No history of CHF will discontinue.  Mood disorder. Patient is very tearful today.  Lost 66 year old daughter 3 years ago to overdose of heroin and still grieving.  Previously seen by Dr. Lalla Brothers in 2022.  Was also referred to Dr. Nicolasa Ducking in 2023.  Chronic pain. Follows with [place] [facility].  Spinal injections by Dr.Eichman.  Pain medication managed by Lynn Ito.  She is currently on pregabalin 150 mg daily, Percocet 10-325 mg every 6 hours as needed.  She takes Senokot as 1 to 2 tablets for mild constipation.    PERTINENT PMH / PSH: Hypertension Hyperlipidemia Carotid stenosis Pulmonary emphysema Osteopenia EtOH use Chronic pain syndrome Tobacco use History of left breast CA, ER positive Prediabetes Mood disorder  OBJECTIVE:  BP 110/70   Pulse 68   Temp 98.2 F (36.8 C) (Oral)   Ht 5\' 4"  (1.626 m)   Wt 129 lb 9.6 oz (58.8 kg)   SpO2 98%   BMI 22.25 kg/m    Physical Exam Constitutional:      General: She is not in acute distress.    Appearance: She is normal weight. She is not ill-appearing.  HENT:     Head: Normocephalic.     Right Ear: Tympanic membrane normal.     Left Ear: Tympanic membrane normal.  Eyes:     Conjunctiva/sclera: Conjunctivae normal.  Neck:     Thyroid: No thyromegaly or thyroid tenderness.  Cardiovascular:     Rate and Rhythm: Normal rate and regular rhythm.     Pulses: Normal pulses.  Pulmonary:     Effort: Pulmonary effort is normal.     Breath sounds: Normal breath sounds.  Abdominal:     General: Bowel sounds are normal.  Neurological:     Mental  Status: She is alert. Mental status is at baseline.  Psychiatric:        Mood and Affect: Mood normal.        Behavior: Behavior normal.        Thought Content: Thought content normal.        Judgment: Judgment normal.     ASSESSMENT/PLAN:  Mood disorder (HCC) Assessment & Plan: Chronic.  Stable. Previously followed by psychiatry, Dr. Lalla Brothers Currently on Lamictal 200 mg daily, Wellbutrin XL 150 mg daily, Cymbalta 60 mg daily Follow-up with psychiatry   Orders: -     Lamotrigine level  Breast cancer screening by mammogram -     3D Screening Mammogram, Left and Right  Primary hypertension -     Hemoglobin A1c -     CBC with Differential/Platelet -     Comprehensive metabolic panel -     TSH  Vitamin D deficiency -     VITAMIN D 25 Hydroxy (Vit-D Deficiency, Fractures)  Hyperlipidemia, unspecified hyperlipidemia type Assessment & Plan: Chronic.  Not currently on statin therapy. Check fasting lipids  Orders: -     Lipid panel -     CK -     LDL cholesterol, direct  Tobacco use disorder Assessment & Plan: Chronic.  22.5 pack year history Statin smoking cessation Continue to encourage decrease smoking  Alcohol use disorder, mild, abuse Assessment & Plan: Chronic.  2 glasses of wine daily.  1 box of wine weekly   Orders: -     Vitamin B12 -     Vitamin B1 -     Folate -     Phosphorus -     Magnesium  Need for pneumococcal 20-valent conjugate vaccination -     Pneumococcal conjugate vaccine 20-valent  Chronic pain syndrome Assessment & Plan: Currently follows with pain management.  On chronic opioids.   Atherosclerosis of renal artery (HCC)    HCM Recommend pneumonia vaccine Recommend influenza and COVID booster Medicare annual wellness visit due  PDMP reviewed  Return in about 8 weeks (around 06/19/2022) for PCP.  Carollee Leitz, MD

## 2022-04-24 ENCOUNTER — Ambulatory Visit (INDEPENDENT_AMBULATORY_CARE_PROVIDER_SITE_OTHER): Payer: PPO | Admitting: Family Medicine

## 2022-04-24 ENCOUNTER — Encounter: Payer: Self-pay | Admitting: Family Medicine

## 2022-04-24 VITALS — BP 110/70 | HR 68 | Temp 98.2°F | Ht 64.0 in | Wt 129.6 lb

## 2022-04-24 DIAGNOSIS — E559 Vitamin D deficiency, unspecified: Secondary | ICD-10-CM | POA: Diagnosis not present

## 2022-04-24 DIAGNOSIS — I10 Essential (primary) hypertension: Secondary | ICD-10-CM | POA: Diagnosis not present

## 2022-04-24 DIAGNOSIS — Z23 Encounter for immunization: Secondary | ICD-10-CM | POA: Diagnosis not present

## 2022-04-24 DIAGNOSIS — F39 Unspecified mood [affective] disorder: Secondary | ICD-10-CM | POA: Diagnosis not present

## 2022-04-24 DIAGNOSIS — Z1231 Encounter for screening mammogram for malignant neoplasm of breast: Secondary | ICD-10-CM

## 2022-04-24 DIAGNOSIS — R202 Paresthesia of skin: Secondary | ICD-10-CM | POA: Insufficient documentation

## 2022-04-24 DIAGNOSIS — I701 Atherosclerosis of renal artery: Secondary | ICD-10-CM | POA: Diagnosis not present

## 2022-04-24 DIAGNOSIS — G894 Chronic pain syndrome: Secondary | ICD-10-CM

## 2022-04-24 DIAGNOSIS — F17200 Nicotine dependence, unspecified, uncomplicated: Secondary | ICD-10-CM | POA: Diagnosis not present

## 2022-04-24 DIAGNOSIS — F1010 Alcohol abuse, uncomplicated: Secondary | ICD-10-CM

## 2022-04-24 DIAGNOSIS — E785 Hyperlipidemia, unspecified: Secondary | ICD-10-CM | POA: Diagnosis not present

## 2022-04-25 LAB — CBC WITH DIFFERENTIAL/PLATELET
Basophils Absolute: 0 10*3/uL (ref 0.0–0.1)
Basophils Relative: 0.9 % (ref 0.0–3.0)
Eosinophils Absolute: 0.1 10*3/uL (ref 0.0–0.7)
Eosinophils Relative: 1.4 % (ref 0.0–5.0)
HCT: 41.1 % (ref 36.0–46.0)
Hemoglobin: 13.4 g/dL (ref 12.0–15.0)
Lymphocytes Relative: 33.6 % (ref 12.0–46.0)
Lymphs Abs: 1.9 10*3/uL (ref 0.7–4.0)
MCHC: 32.6 g/dL (ref 30.0–36.0)
MCV: 96.8 fl (ref 78.0–100.0)
Monocytes Absolute: 0.5 10*3/uL (ref 0.1–1.0)
Monocytes Relative: 9.7 % (ref 3.0–12.0)
Neutro Abs: 3 10*3/uL (ref 1.4–7.7)
Neutrophils Relative %: 54.4 % (ref 43.0–77.0)
Platelets: 213 10*3/uL (ref 150.0–400.0)
RBC: 4.25 Mil/uL (ref 3.87–5.11)
RDW: 14.3 % (ref 11.5–15.5)
WBC: 5.5 10*3/uL (ref 4.0–10.5)

## 2022-04-25 LAB — LIPID PANEL
Cholesterol: 212 mg/dL — ABNORMAL HIGH (ref 0–200)
HDL: 104.2 mg/dL (ref >39.00)
NonHDL: 107.5
Total CHOL/HDL Ratio: 2
Triglycerides: 215 mg/dL — ABNORMAL HIGH (ref 0.0–149.0)
VLDL: 43 mg/dL — ABNORMAL HIGH (ref 0.0–40.0)

## 2022-04-25 LAB — COMPREHENSIVE METABOLIC PANEL
ALT: 16 U/L (ref 0–35)
AST: 22 U/L (ref 0–37)
Albumin: 4.2 g/dL (ref 3.5–5.2)
Alkaline Phosphatase: 130 U/L — ABNORMAL HIGH (ref 39–117)
BUN: 11 mg/dL (ref 6–23)
CO2: 30 mEq/L (ref 19–32)
Calcium: 9.7 mg/dL (ref 8.4–10.5)
Chloride: 100 mEq/L (ref 96–112)
Creatinine, Ser: 0.82 mg/dL (ref 0.40–1.20)
GFR: 74.83 mL/min (ref >60.00)
Glucose, Bld: 80 mg/dL (ref 70–99)
Potassium: 4.4 mEq/L (ref 3.5–5.1)
Sodium: 138 mEq/L (ref 135–145)
Total Bilirubin: 0.4 mg/dL (ref 0.2–1.2)
Total Protein: 7.2 g/dL (ref 6.0–8.3)

## 2022-04-25 LAB — TSH: TSH: 1.54 u[IU]/mL (ref 0.35–5.50)

## 2022-04-25 LAB — LDL CHOLESTEROL, DIRECT: Direct LDL: 46 mg/dL

## 2022-04-25 LAB — VITAMIN D 25 HYDROXY (VIT D DEFICIENCY, FRACTURES): VITD: 33.81 ng/mL (ref 30.00–100.00)

## 2022-04-25 LAB — HEMOGLOBIN A1C: Hgb A1c MFr Bld: 5.4 % (ref 4.6–6.5)

## 2022-04-25 LAB — FOLATE: Folate: 11.6 ng/mL (ref >5.9)

## 2022-04-25 LAB — VITAMIN B12: Vitamin B-12: 448 pg/mL (ref 211–911)

## 2022-04-25 LAB — PHOSPHORUS: Phosphorus: 4 mg/dL (ref 2.3–4.6)

## 2022-04-25 LAB — MAGNESIUM: Magnesium: 2 mg/dL (ref 1.5–2.5)

## 2022-04-25 LAB — CK: Total CK: 83 U/L (ref 7–177)

## 2022-04-26 LAB — LAMOTRIGINE LEVEL: Lamotrigine Lvl: 6.4 ug/mL (ref 2.5–15.0)

## 2022-04-27 ENCOUNTER — Encounter: Payer: Self-pay | Admitting: Family Medicine

## 2022-04-27 DIAGNOSIS — C773 Secondary and unspecified malignant neoplasm of axilla and upper limb lymph nodes: Secondary | ICD-10-CM | POA: Insufficient documentation

## 2022-04-27 DIAGNOSIS — F39 Unspecified mood [affective] disorder: Secondary | ICD-10-CM | POA: Insufficient documentation

## 2022-04-27 DIAGNOSIS — Z23 Encounter for immunization: Secondary | ICD-10-CM | POA: Insufficient documentation

## 2022-04-27 DIAGNOSIS — D473 Essential (hemorrhagic) thrombocythemia: Secondary | ICD-10-CM | POA: Insufficient documentation

## 2022-04-27 NOTE — Assessment & Plan Note (Signed)
Currently follows with pain management.  On chronic opioids.

## 2022-04-27 NOTE — Assessment & Plan Note (Signed)
Chronic.  2 glasses of wine daily.  1 box of wine weekly

## 2022-04-27 NOTE — Assessment & Plan Note (Signed)
Chronic.  22.5 pack year history Statin smoking cessation Continue to encourage decrease smoking

## 2022-04-27 NOTE — Assessment & Plan Note (Signed)
Chronic.  Stable. Previously followed by psychiatry, Dr. Lalla Brothers Currently on Lamictal 200 mg daily, Wellbutrin XL 150 mg daily, Cymbalta 60 mg daily Follow-up with psychiatry

## 2022-04-27 NOTE — Assessment & Plan Note (Signed)
Chronic.  Not currently on statin therapy. Check fasting lipids

## 2022-04-28 LAB — VITAMIN B1: Vitamin B1 (Thiamine): 8 nmol/L (ref 8–30)

## 2022-05-25 ENCOUNTER — Ambulatory Visit (INDEPENDENT_AMBULATORY_CARE_PROVIDER_SITE_OTHER): Payer: PPO | Admitting: Family Medicine

## 2022-05-25 ENCOUNTER — Encounter: Payer: Self-pay | Admitting: Family Medicine

## 2022-05-25 VITALS — BP 122/78 | HR 65 | Temp 98.3°F | Ht 64.0 in | Wt 125.8 lb

## 2022-05-25 DIAGNOSIS — M5412 Radiculopathy, cervical region: Secondary | ICD-10-CM

## 2022-05-25 MED ORDER — PREDNISONE 20 MG PO TABS: 40.0000 mg | ORAL_TABLET | Freq: Every day | ORAL | 0 refills | Status: DC

## 2022-05-25 MED ORDER — BACLOFEN 10 MG PO TABS: 10.0000 mg | ORAL_TABLET | Freq: Three times a day (TID) | ORAL | 0 refills | Status: DC | PRN

## 2022-05-25 NOTE — Patient Instructions (Addendum)
Nice to see you. If you do not hear back from the pain specialist within 1 to 2 weeks please let us know.  I referred you to [facility] health interventional pain management specialists at [facility]. If you get drowsy with the baclofen please stop using it.  Do not drive while taking the baclofen.  Do not take the baclofen if you have had any alcohol to drink. Please monitor for inability to sleep or excessive agitation on the prednisone.  If those occur please stop the prednisone and let us know.

## 2022-05-25 NOTE — Assessment & Plan Note (Signed)
Chronic issue.  Patient has had progressively worsening pain in her neck and down her arms.  She has recently stopped oxycodone.  Discussed referral to a new pain specialist.  Discussed a trial of prednisone to see if that would reduce some of her symptoms.  Discussed trying baclofen as a muscle relaxer to see if that provides any benefit.  Advised to monitor for drowsiness with the baclofen and if she gets excessively drowsy she should not continue with this.  Advised not to take the baclofen when she has been drinking alcohol.

## 2022-05-25 NOTE — Progress Notes (Signed)
Marikay Alar, MD Phone: [phone]  Ashley Cross is a 67 y.o. female who presents today for same day visit.   Chronic neck pain: Patient notes this has been going on for years.  She gets pain radiating from her neck down both of her arms.  She has had several neck surgeries.  She has been following with pain management and neurosurgery in [place].  She was on oxycodone through pain management to stop this 3 weeks ago.  She been on this for quite some time.  In the past ibuprofen and Tylenol have given her diarrhea.  She does not want go back to the pain management doctor at the neurosurgeons office.  She does note some chronic numbness in her upper extremities that feel like electrical pulses going down her arms.  She reports chronic weakness and chronic balance issues as well.  Social History   Tobacco Use  Smoking Status Every Day   Packs/day: 0.25   Years: 30.00   Additional pack years: 0.00   Total pack years: 7.50   Types: Cigarettes  Smokeless Tobacco Never  Tobacco Comments   work days - 6 cigarettes; weekends - ~10 - reported on 10/13/2020    Current Outpatient Medications on File Prior to Visit  Medication Sig Dispense Refill   buPROPion (WELLBUTRIN XL) 150 MG 24 hr tablet Take 1 tablet (150 mg total) by mouth daily. 90 tablet 3   Cholecalciferol (VITAMIN D3 PO) Take by mouth.     Cyanocobalamin (VITAMIN B-12 PO) Take by mouth.     DULoxetine (CYMBALTA) 60 MG capsule Take 1 capsule (60 mg total) by mouth daily. 90 capsule 3   fluticasone (FLONASE) 50 MCG/ACT nasal spray USE TWO SPRAYS IN EACH NOSTRIL DAILY 16 g 1   furosemide (LASIX) 40 MG tablet Take 1 tablet (40 mg total) by mouth daily as needed. For edema 90 tablet 3   ipratropium (ATROVENT) 0.06 % nasal spray PLACE 2 SPRAYS INTO BOTH NOSTRILS 3 TIMES DAILY AS NEEDED 15 mL 12   lamoTRIgine (LAMICTAL) 200 MG tablet TAKE 1 TABLET BY MOUTH DAILY 60 tablet 5   letrozole (FEMARA) 2.5 MG tablet Take 1 tablet (2.5  mg total) by mouth daily. 90 tablet 3   montelukast (SINGULAIR) 10 MG tablet Take 1 tablet (10 mg total) by mouth at bedtime. 90 tablet 3   Multiple Vitamins-Minerals (MULTIVITAMIN) tablet Take 1 tablet by mouth daily. 90 tablet 3   nebivolol (BYSTOLIC) 5 MG tablet Take 1 tablet (5 mg total) by mouth daily. 90 tablet 3   pregabalin (LYRICA) 150 MG capsule Take 150 mg by mouth 3 (three) times daily.     Pyridoxine HCl (VITAMIN B-6 PO) Take by mouth.     senna-docusate (SENEXON-S) 8.6-50 MG tablet TAKE 1-2 TABLETS BY MOUTH DAILY AS NEEDED FOR MILD CONSTIPATION. 180 tablet 3   telmisartan (MICARDIS) 80 MG tablet TAKE ONE TABLET BY MOUTH EVERY DAY 90 tablet 3   No current facility-administered medications on file prior to visit.     ROS see history of present illness  Objective  Physical Exam Vitals:   05/25/22 1403  BP: 122/78  Pulse: 65  Temp: 98.3 F (36.8 C)  SpO2: 97%    BP Readings from Last 3 Encounters:  05/25/22 122/78  04/24/22 110/70  09/01/21 138/70   Wt Readings from Last 3 Encounters:  05/25/22 125 lb 12.8 oz (57.1 kg)  04/24/22 129 lb 9.6 oz (58.8 kg)  09/01/21 126 lb 12.8 oz (57.5  kg)    Physical Exam Musculoskeletal:     Comments: Posterior neck with postsurgical changes, no apparent tenderness on exam  Neurological:     Comments: 5/5 strength in bilateral biceps, triceps, grip, quads, hamstrings, plantar and dorsiflexion, sensation to light touch intact in bilateral UE and LE      Assessment/Plan: Please see individual problem list.  Cervical radiculopathy Assessment & Plan: Chronic issue.  Patient has had progressively worsening pain in her neck and down her arms.  She has recently stopped oxycodone.  Discussed referral to a new pain specialist.  Discussed a trial of prednisone to see if that would reduce some of her symptoms.  Discussed trying baclofen as a muscle relaxer to see if that provides any benefit.  Advised to monitor for drowsiness with  the baclofen and if she gets excessively drowsy she should not continue with this.  Advised not to take the baclofen when she has been drinking alcohol.  Orders: -     Ambulatory referral to Pain Clinic -     predniSONE; Take 2 tablets (40 mg total) by mouth daily with breakfast.  Dispense: 10 tablet; Refill: 0 -     Baclofen; Take 1 tablet (10 mg total) by mouth 3 (three) times daily as needed for muscle spasms.  Dispense: 30 each; Refill: 0     Return if symptoms worsen or fail to improve.   Marikay Alar, MD [facility]

## 2022-06-07 DIAGNOSIS — G5622 Lesion of ulnar nerve, left upper limb: Secondary | ICD-10-CM | POA: Diagnosis not present

## 2022-06-07 DIAGNOSIS — M5412 Radiculopathy, cervical region: Secondary | ICD-10-CM | POA: Diagnosis not present

## 2022-06-18 NOTE — Patient Instructions (Incomplete)
It was a pleasure seeing you today. Thank you for allowing me to take part in your health care.  Our goals for today as we discussed include:  Schedule Medicare Annual Wellness Visit   Referral sent for Mammogram. Please call to schedule appointment. [facility] [address] [place], [place] [address] [phone]   Referral sent for pain management  Refill sent for baclofen  If you have any questions or concerns, please do not hesitate to call the office at [phone].  I look forward to our next visit and until then take care and stay safe.  Regards,   Dana Allan, MD   [facility]

## 2022-06-19 ENCOUNTER — Encounter: Payer: Self-pay | Admitting: Family Medicine

## 2022-06-19 ENCOUNTER — Ambulatory Visit (INDEPENDENT_AMBULATORY_CARE_PROVIDER_SITE_OTHER): Payer: PPO | Admitting: Family Medicine

## 2022-06-19 VITALS — BP 132/72 | HR 83 | Ht 64.0 in | Wt 127.0 lb

## 2022-06-19 DIAGNOSIS — M47816 Spondylosis without myelopathy or radiculopathy, lumbar region: Secondary | ICD-10-CM | POA: Diagnosis not present

## 2022-06-19 DIAGNOSIS — R197 Diarrhea, unspecified: Secondary | ICD-10-CM

## 2022-06-19 DIAGNOSIS — I10 Essential (primary) hypertension: Secondary | ICD-10-CM

## 2022-06-19 DIAGNOSIS — M47812 Spondylosis without myelopathy or radiculopathy, cervical region: Secondary | ICD-10-CM | POA: Diagnosis not present

## 2022-06-19 DIAGNOSIS — G629 Polyneuropathy, unspecified: Secondary | ICD-10-CM

## 2022-06-19 DIAGNOSIS — M961 Postlaminectomy syndrome, not elsewhere classified: Secondary | ICD-10-CM | POA: Diagnosis not present

## 2022-06-19 DIAGNOSIS — M5416 Radiculopathy, lumbar region: Secondary | ICD-10-CM | POA: Diagnosis not present

## 2022-06-19 DIAGNOSIS — R748 Abnormal levels of other serum enzymes: Secondary | ICD-10-CM | POA: Diagnosis not present

## 2022-06-19 DIAGNOSIS — M546 Pain in thoracic spine: Secondary | ICD-10-CM

## 2022-06-19 DIAGNOSIS — R937 Abnormal findings on diagnostic imaging of other parts of musculoskeletal system: Secondary | ICD-10-CM

## 2022-06-19 DIAGNOSIS — M48061 Spinal stenosis, lumbar region without neurogenic claudication: Secondary | ICD-10-CM | POA: Diagnosis not present

## 2022-06-19 DIAGNOSIS — G894 Chronic pain syndrome: Secondary | ICD-10-CM

## 2022-06-19 DIAGNOSIS — M5412 Radiculopathy, cervical region: Secondary | ICD-10-CM | POA: Diagnosis not present

## 2022-06-19 DIAGNOSIS — Z9889 Other specified postprocedural states: Secondary | ICD-10-CM | POA: Diagnosis not present

## 2022-06-19 LAB — COMPREHENSIVE METABOLIC PANEL
ALT: 25 U/L (ref 0–35)
AST: 28 U/L (ref 0–37)
Albumin: 4.1 g/dL (ref 3.5–5.2)
Alkaline Phosphatase: 121 U/L — ABNORMAL HIGH (ref 39–117)
BUN: 9 mg/dL (ref 6–23)
CO2: 26 mEq/L (ref 19–32)
Calcium: 9.8 mg/dL (ref 8.4–10.5)
Chloride: 101 mEq/L (ref 96–112)
Creatinine, Ser: 0.68 mg/dL (ref 0.40–1.20)
GFR: 91.01 mL/min (ref >60.00)
Glucose, Bld: 87 mg/dL (ref 70–99)
Potassium: 4.4 mEq/L (ref 3.5–5.1)
Sodium: 136 mEq/L (ref 135–145)
Total Bilirubin: 0.5 mg/dL (ref 0.2–1.2)
Total Protein: 6.8 g/dL (ref 6.0–8.3)

## 2022-06-19 LAB — GAMMA GT: GGT: 40 U/L (ref 7–51)

## 2022-06-19 MED ORDER — BACLOFEN 10 MG PO TABS
10.0000 mg | ORAL_TABLET | Freq: Three times a day (TID) | ORAL | 1 refills | Status: AC | PRN
Start: 2022-06-19 — End: ?

## 2022-06-19 NOTE — Progress Notes (Signed)
SUBJECTIVE:   Chief Complaint  Patient presents with   Medical Management of Chronic Issues   HPI Patient presents to clinic for chronic disease management  Concern today for diarrhea x 6 weeks.  Initially 3 to 4 episodes daily but has now improved.  Having mild abdominal cramps.  Denies any fever, bloody stool, weight loss, decrease in appetite.  No recent antibiotic treatment.  Denies any sick contacts.  Has been hydrating well.  Endorses increase in stress.  Also endorses has been out of chronic opioids for 1 month.  Hypertension Asymptomatic.  Well-controlled on current medication.  Compliant with Bystolic 5 mg daily and Micardis 80 mg daily.    Chronic pain. Previously followed by [place] [facility] for spinal injections and pain management by Celedonio Miyamoto.  She is requesting referral closer to [facility] for continued management.  History of chronic pain, spinal stenosis, cervical radiculopathy, lumbar radiculopathy.  Requesting refill for baclofen.     PERTINENT PMH / PSH: Hypertension Hyperlipidemia Carotid stenosis Pulmonary emphysema Osteopenia EtOH use Chronic pain syndrome Tobacco use History of left breast CA, ER positive Prediabetes Mood disorder  OBJECTIVE:  BP 132/72   Pulse 83   Ht 5\' 4"  (1.626 m)   Wt 127 lb (57.6 kg)   SpO2 97%   BMI 21.80 kg/m    Physical Exam Constitutional:      General: She is not in acute distress.    Appearance: She is normal weight. She is not ill-appearing.  HENT:     Head: Normocephalic.     Right Ear: Tympanic membrane normal.     Left Ear: Tympanic membrane normal.  Eyes:     Conjunctiva/sclera: Conjunctivae normal.  Neck:     Thyroid: No thyromegaly or thyroid tenderness.  Cardiovascular:     Rate and Rhythm: Normal rate and regular rhythm.     Pulses: Normal pulses.  Pulmonary:     Effort: Pulmonary effort is normal.     Breath sounds: Normal breath sounds.  Abdominal:     General: Bowel sounds are  normal.  Neurological:     Mental Status: She is alert. Mental status is at baseline.  Psychiatric:        Mood and Affect: Mood normal.        Behavior: Behavior normal.        Thought Content: Thought content normal.        Judgment: Judgment normal.     ASSESSMENT/PLAN:  Diarrhea, unspecified type Assessment & Plan: New problem.  Suspect opioid withdrawal given symptoms initiated around similar time.  Low suspicion for infectious etiology given no fevers, abdominal pain or recent sick contacts.  No recent history of antibiotic use.  Symptoms seem to be improving. Will send stool for culture, O&P, C. Difficile Check electrolytes Avoid antidiarrheals  Continue to hydrate Follow-up if no improvement   Orders: -     Cdiff NAA+O+P+Stool Culture; Future -     Comprehensive metabolic panel  Elevated alkaline phosphatase level Assessment & Plan: Chronic elevated LFTs Check GGT  Orders: -     Gamma GT  Chronic pain syndrome -     Ambulatory referral to Pain Clinic  Lumbar radiculopathy -     Ambulatory referral to Pain Clinic  Cervical radiculopathy -     Ambulatory referral to Pain Clinic -     Baclofen; Take 1 tablet (10 mg total) by mouth 3 (three) times daily as needed for muscle spasms.  Dispense: 30 each; Refill: 1  Cervical spondylosis -     Ambulatory referral to Pain Clinic -     Baclofen; Take 1 tablet (10 mg total) by mouth 3 (three) times daily as needed for muscle spasms.  Dispense: 30 each; Refill: 1  Arthritis, lumbar spine -     Ambulatory referral to Pain Clinic -     Baclofen; Take 1 tablet (10 mg total) by mouth 3 (three) times daily as needed for muscle spasms.  Dispense: 30 each; Refill: 1  Arthritis of neck -     Ambulatory referral to Pain Clinic -     Baclofen; Take 1 tablet (10 mg total) by mouth 3 (three) times daily as needed for muscle spasms.  Dispense: 30 each; Refill: 1  Neuropathy -     Ambulatory referral to Pain Clinic -      Baclofen; Take 1 tablet (10 mg total) by mouth 3 (three) times daily as needed for muscle spasms.  Dispense: 30 each; Refill: 1  Spinal stenosis of lumbar region, unspecified whether neurogenic claudication present -     Ambulatory referral to Pain Clinic -     Baclofen; Take 1 tablet (10 mg total) by mouth 3 (three) times daily as needed for muscle spasms.  Dispense: 30 each; Refill: 1  Cervical postlaminectomy syndrome -     Ambulatory referral to Pain Clinic  Cervical post-laminectomy syndrome -     Ambulatory referral to Pain Clinic  H/O cervical spine surgery -     Ambulatory referral to Pain Clinic  Thoracic spine pain -     Ambulatory referral to Pain Clinic  Abnormal MRI, cervical spine -     Ambulatory referral to Pain Clinic  Abnormal MRI, lumbar spine -     Ambulatory referral to Pain Clinic  Primary hypertension Assessment & Plan: Chronic.  Stable.  Tolerating. Continue Bystolic 5 mg daily Continue Micardis 80 mg daily C-Met today Follow-up 6 months     HCM Recommend pneumonia vaccine Recommend influenza and COVID booster Medicare annual wellness visit due  PDMP reviewed  Return if symptoms worsen or fail to improve.  Dana Allan, MD

## 2022-06-20 ENCOUNTER — Telehealth: Payer: Self-pay | Admitting: Family Medicine

## 2022-06-20 NOTE — Telephone Encounter (Signed)
Copied from CRM [id number]. Topic: Medicare AWV >> Jun 20, 2022 10:26 AM Payton Doughty wrote: Reason for CRM: Called patient to schedule Medicare Annual Wellness Visit (AWV). Left message for patient to call back and schedule Medicare Annual Wellness Visit (AWV).  Last date of AWV: 05/11/21  Please schedule an appointment at any time with Sydell Axon, CMA  .  If any questions, please contact me.  Thank you ,  Verlee Rossetti; Care Guide Ambulatory Clinical Support [place] [facility] Direct Dial: [phone]

## 2022-06-21 DIAGNOSIS — G959 Disease of spinal cord, unspecified: Secondary | ICD-10-CM | POA: Diagnosis not present

## 2022-06-26 DIAGNOSIS — M961 Postlaminectomy syndrome, not elsewhere classified: Secondary | ICD-10-CM | POA: Diagnosis not present

## 2022-06-26 DIAGNOSIS — G959 Disease of spinal cord, unspecified: Secondary | ICD-10-CM | POA: Diagnosis not present

## 2022-06-26 NOTE — Progress Notes (Unsigned)
I connected with  [name] on 06/27/2022  by a audio enabled telemedicine application and verified that I am speaking with the correct person using two identifiers.  Patient Location: Home  Provider Location: Home Office  I discussed the limitations of evaluation and management by telemedicine. The patient expressed understanding and agreed to proceed.   Subjective:   Ashley Cross is a 67 y.o. female who presents for Medicare Annual (Subsequent) preventive examination.  Review of Systems    Per HPI unless specifically indicated below.  Cardiac Risk Factors include: advanced age (>31men, >49 women);female gender,Hypertension, and Hyperlipidemia.         Objective:       06/19/2022   11:38 AM 05/25/2022    2:03 PM 04/24/2022   12:54 PM  Vitals with BMI  Height 5\' 4"  5\' 4"  5\' 4"   Weight 127 lbs 125 lbs 13 oz 129 lbs 10 oz  BMI 21.79 21.58 22.23  Systolic 132 122 130  Diastolic 72 78 70  Pulse 83 65 68    Today's Vitals   06/27/22 0947  PainSc: 8    There is no height or weight on file to calculate BMI.     06/27/2022    9:55 AM 04/21/2021    9:11 AM 02/17/2021    6:07 PM 02/16/2021    8:56 PM 11/17/2020   11:29 AM 04/20/2020    9:29 AM 06/24/2017    1:27 PM  Advanced Directives  Does Patient Have a Medical Advance Directive? No No  No No No No  Would patient like information on creating a medical advance directive? No - Patient declined No - Patient declined No - Patient declined   No - Patient declined Yes (MAU/Ambulatory/Procedural Areas - Information given)    Current Medications (verified) Outpatient Encounter Medications as of 06/27/2022  Medication Sig   baclofen (LIORESAL) 10 MG tablet Take 1 tablet (10 mg total) by mouth 3 (three) times daily as needed for muscle spasms.   buPROPion (WELLBUTRIN XL) 150 MG 24 hr tablet Take 1 tablet (150 mg total) by mouth daily.   Cholecalciferol (VITAMIN D3 PO) Take by mouth.   Cyanocobalamin (VITAMIN  B-12 PO) Take by mouth.   DULoxetine (CYMBALTA) 60 MG capsule Take 1 capsule (60 mg total) by mouth daily.   ipratropium (ATROVENT) 0.06 % nasal spray PLACE 2 SPRAYS INTO BOTH NOSTRILS 3 TIMES DAILY AS NEEDED   lamoTRIgine (LAMICTAL) 200 MG tablet TAKE 1 TABLET BY MOUTH DAILY   letrozole (FEMARA) 2.5 MG tablet Take 1 tablet (2.5 mg total) by mouth daily.   montelukast (SINGULAIR) 10 MG tablet Take 1 tablet (10 mg total) by mouth at bedtime.   Multiple Vitamins-Minerals (MULTIVITAMIN) tablet Take 1 tablet by mouth daily.   nebivolol (BYSTOLIC) 5 MG tablet Take 1 tablet (5 mg total) by mouth daily.   oxyCODONE-acetaminophen (PERCOCET) 10-325 MG tablet Take 1 tablet by mouth every 6 (six) hours as needed for pain.   pregabalin (LYRICA) 150 MG capsule Take 150 mg by mouth 3 (three) times daily.   telmisartan (MICARDIS) 80 MG tablet TAKE ONE TABLET BY MOUTH EVERY DAY   senna-docusate (SENEXON-S) 8.6-50 MG tablet TAKE 1-2 TABLETS BY MOUTH DAILY AS NEEDED FOR MILD CONSTIPATION. (Patient not taking: Reported on 06/27/2022)   No facility-administered encounter medications on file as of 06/27/2022.    Allergies (verified) Anastrozole, Augmentin [amoxicillin-pot clavulanate], Norvasc [amlodipine], Topamax [topiramate], Sulfa antibiotics, and Sulfonamide derivatives   History: Past Medical History:  Diagnosis Date   Anxiety    Breast cancer (HCC)    Left breast(nipple mass) 2018   Cervical stenosis of spine    Chicken pox    Chronic back pain    Colon polyps    COPD (chronic obstructive pulmonary disease) (HCC)    Cyst of left nipple    Depression    Hypertension    Insomnia    Personal history of radiation therapy    Past Surgical History:  Procedure Laterality Date   BACK SURGERY     x 3 (2010 x 2; 2013 x 1)  Dr. Yetta Barre   BREAST BIOPSY Left    core- neg   BREAST LUMPECTOMY Left 2018   BREAST SURGERY Left    breast biopsy   CERVICAL CONE BIOPSY     CERVICAL FUSION     times 2    COLONOSCOPY WITH PROPOFOL N/A 04/18/2015   Procedure: COLONOSCOPY WITH PROPOFOL;  Surgeon: Elnita Maxwell, MD;  Location: [facility] ENDOSCOPY;  Service: Endoscopy;  Laterality: N/A;   ELBOW SURGERY     2014 b/l elbows per pt ulnar nerve    ELBOW SURGERY     x2    EPIDURAL BLOCK INJECTION     Dr. Ollen Bowl    MASS EXCISION Left 04/12/2016   Procedure: EXCISION OF LEFT NIPPLE MASS;  Surgeon: Almond Lint, MD;  Location: [facility];  Service: General;  Laterality: Left;   NECK SURGERY     POSTERIOR CERVICAL FUSION/FORAMINOTOMY  03/07/2011   Procedure: POSTERIOR CERVICAL FUSION/FORAMINOTOMY LEVEL 4;  Surgeon: Tia Alert, MD;  Location: [facility] NEURO ORS;  Service: Neurosurgery;  Laterality: N/A;  cervical three to thoracic one posterior cervical fusion    SENTINEL NODE BIOPSY Left 05/29/2016   Procedure: LEFT SENTINEL LYMPH NODE BIOPSY;  Surgeon: Almond Lint, MD;  Location: [facility] OR;  Service: General;  Laterality: Left;   TONSILLECTOMY     Family History  Problem Relation Age of Onset   Breast cancer Mother 53   Hypertension Mother    Cancer Mother        breast dx'ed 1    Cancer Father        prostate   Hypertension Father    Drug abuse Daughter    Hypertension Maternal Grandmother    Breast cancer Cousin    Social History   Socioeconomic History   Marital status: Divorced    Spouse name: Not on file   Number of children: 1   Years of education: Not on file   Highest education level: Not on file  Occupational History   Occupation: Disability  Tobacco Use   Smoking status: Every Day    Packs/day: 0.50    Years: 30.00    Additional pack years: 0.00    Total pack years: 15.00    Types: Cigarettes   Smokeless tobacco: Never   Tobacco comments:    work days - 6 cigarettes; weekends - ~10 - reported on 10/13/2020  Vaping Use   Vaping Use: Former  Substance and Sexual Activity   Alcohol use: Yes    Alcohol/week: 4.0 standard drinks of alcohol    Types: 4 Glasses  of wine per week    Comment: weekends   Drug use: No    Comment: on opana since jan 2018   Sexual activity: Not on file  Other Topics Concern   Not on file  Social History Narrative   As of 01/25/17 not sexually active of in  relationship    Divorced   2 kids son and daughter (though daughter died of suicide in 2017-07-09)   Son lives in [place] now as of 12/2018       Disability    Loves animals has 3 dogs           Social Determinants of Health   Financial Resource Strain: High Risk (06/27/2022)   Overall Financial Resource Strain (CARDIA)    Difficulty of Paying Living Expenses: Hard  Food Insecurity: No Food Insecurity (06/27/2022)   Hunger Vital Sign    Worried About Running Out of Food in the Last Year: Never true    Ran Out of Food in the Last Year: Never true  Transportation Needs: No Transportation Needs (06/27/2022)   PRAPARE - Administrator, Civil Service (Medical): No    Lack of Transportation (Non-Medical): No  Physical Activity: Inactive (06/27/2022)   Exercise Vital Sign    Days of Exercise per Week: 0 days    Minutes of Exercise per Session: 0 min  Stress: Stress Concern Present (06/27/2022)   [employer] of Occupational Health - Occupational Stress Questionnaire    Feeling of Stress : Rather much  Social Connections: Socially Isolated (03/29/2021)   Social Connection and Isolation Panel [NHANES]    Frequency of Communication with Friends and Family: More than three times a week    Frequency of Social Gatherings with Friends and Family: Three times a week    Attends Religious Services: Never    Active Member of Clubs or Organizations: No    Attends Banker Meetings: Never    Marital Status: Divorced    Tobacco Counseling Ready to quit: Not Answered Counseling given: No Tobacco comments: work days - 6 cigarettes; weekends - ~10 - reported on 10/13/2020   Clinical Intake:  Pre-visit preparation completed: No  Pain : 0-10 Pain  Score: 8  Pain Type: Chronic pain Pain Location: Neck (bilateral shoulder, arms) Pain Orientation: Right, Left Pain Descriptors / Indicators: Aching Pain Onset: More than a month ago Pain Frequency: Constant     Nutritional Status: BMI of 19-24  Normal Nutritional Risks: Nausea/ vomitting/ diarrhea, Unintentional weight gain Diabetes: No  How often do you need to have someone help you when you read instructions, pamphlets, or other written materials from your doctor or pharmacy?: 1 - Never  Diabetic? No  Interpreter Needed?: No  Information entered by :: Laurel Dimmer, CMA   Activities of Daily Living    06/27/2022    9:44 AM  In your present state of health, do you have any difficulty performing the following activities:  Hearing? 1  Vision? 1  Comment [facility]  Difficulty concentrating or making decisions? 1  Walking or climbing stairs? 1  Dressing or bathing? 0  Doing errands, shopping? 0    Patient Care Team: Dana Allan, MD as PCP - General (Family Medicine) Margaretann Loveless, MD (Internal Medicine) Axel Filler, Larna Daughters, NP as Nurse Practitioner (Hematology and Oncology) Serena Croissant, MD as Consulting Physician (Hematology and Oncology) Almond Lint, MD as Consulting Physician (General Surgery) Dorothy Puffer, MD as Consulting Physician (Radiation Oncology)  Indicate any recent Medical Services you may have received from other than [facility] providers in the past year (date may be approximate).     Assessment:   This is a routine wellness examination for Lillee.   Hearing/Vision screen Denies any hearing issues. Denies any change to her vision. Annual Eye Exam.  Dietary issues and exercise activities discussed: Current Exercise Habits: The patient does not participate in regular exercise at present, Exercise limited by: orthopedic condition(s)   Goals Addressed   None    Depression Screen    06/27/2022    9:42 AM 06/19/2022   11:40 AM  05/25/2022    2:09 PM 04/24/2022    1:02 PM 09/01/2021    9:49 AM 04/21/2021    9:20 AM 03/29/2021    3:44 PM  PHQ 2/9 Scores  PHQ - 2 Score 4 1 0 5  0 1  PHQ- 9 Score  5 0 14  2   Exception Documentation     Patient refusal      Fall Risk    06/27/2022    9:43 AM 06/19/2022   11:40 AM 05/25/2022    2:08 PM 04/24/2022    1:02 PM 09/01/2021    9:49 AM  Fall Risk   Falls in the past year? 1 0 0 1 0  Number falls in past yr: 0  0 0   Injury with Fall? 0  0 1   Risk for fall due to : Impaired balance/gait;Impaired mobility No Fall Risks No Fall Risks No Fall Risks   Follow up Falls evaluation completed  Falls evaluation completed Falls evaluation completed     FALL RISK PREVENTION PERTAINING TO THE HOME:  Any stairs in or around the home? Yes  If so, are there any without handrails? No  Home free of loose throw rugs in walkways, pet beds, electrical cords, etc? Yes  Adequate lighting in your home to reduce risk of falls? Yes   ASSISTIVE DEVICES UTILIZED TO PREVENT FALLS:  Life alert? No  Use of a cane, walker or w/c? No  Grab bars in the bathroom? No  Shower chair or bench in shower? No  Elevated toilet seat or a handicapped toilet? Yes   TIMED UP AND GO:  Was the test performed? Unable to perform, virtual appointment   Cognitive Function:        06/27/2022    9:50 AM 04/21/2021   10:20 AM 04/20/2020    9:29 AM  6CIT Screen  What Year? 0 points 0 points 0 points  What month? 0 points 0 points 0 points  What time? 0 points 0 points 0 points  Count back from 20 0 points 0 points 0 points  Months in reverse 0 points 0 points 0 points  Repeat phrase 10 points 2 points 0 points  Total Score 10 points 2 points 0 points    Immunizations Immunization History  Administered Date(s) Administered   Influenza,inj,Quad PF,6+ Mos 01/25/2017, 11/26/2017, 12/17/2018, 12/07/2020   Moderna Sars-Covid-2 Vaccination 01/08/2020   PFIZER(Purple Top)SARS-COV-2 Vaccination 05/21/2019,  06/11/2019   PNEUMOCOCCAL CONJUGATE-20 04/24/2022   Pneumococcal Polysaccharide-23 03/26/2018   Td 11/15/2003   Tdap 01/30/2016   Zoster Recombinat (Shingrix) 05/08/2021, 10/04/2021    TDAP status: Up to date  Flu Vaccine status: Up to date  Pneumococcal vaccine status: Up to date  Covid-19 vaccine status: Information provided on how to obtain vaccines.   Qualifies for Shingles Vaccine? Yes   Zostavax completed Yes   Shingrix Completed?: Yes  Screening Tests Health Maintenance  Topic Date Due   COVID-19 Vaccine (4 - 2023-24 season) 10/06/2021   INFLUENZA VACCINE  09/06/2022   MAMMOGRAM  10/13/2022   Medicare Annual Wellness (AWV)  06/27/2023   COLONOSCOPY (Pts 45-28yrs Insurance coverage will need to be confirmed)  04/17/2025   DTaP/Tdap/Td (  3 - Td or Tdap) 01/29/2026   Pneumonia Vaccine 62+ Years old  Completed   DEXA SCAN  Completed   Hepatitis C Screening  Completed   Zoster Vaccines- Shingrix  Completed   HPV VACCINES  Aged Out    Health Maintenance  Health Maintenance Due  Topic Date Due   COVID-19 Vaccine (4 - 2023-24 season) 10/06/2021    Colorectal cancer screening: Type of screening: Colonoscopy. Completed 04/18/2015. Repeat every 10 years  Mammogram status: Ordered 04/24/2022. Pt provided with contact info and advised to call to schedule appt.   DEXA Scan: 05/14/20  Lung Cancer Screening: (Low Dose CT Chest recommended if Age 62-80 years, 30 pack-year currently smoking OR have quit w/in 15years.) doesn't qualify.   Lung Cancer Screening Referral: not applicable   Additional Screening:  Hepatitis C Screening: does qualify; Completed 02/17/2021  Vision Screening: Recommended annual ophthalmology exams for early detection of glaucoma and other disorders of the eye. Is the patient up to date with their annual eye exam?  Yes  Who is the provider or what is the name of the office in which the patient attends annual eye exams? [facility]  If pt  is not established with a provider, would they like to be referred to a provider to establish care? No .   Dental Screening: Recommended annual dental exams for proper oral hygiene  Community Resource Referral / Chronic Care Management: CRR required this visit?  Yes   CCM required this visit?  Yes      Plan:     I have personally reviewed and noted the following in the patient's chart:   Medical and social history Use of alcohol, tobacco or illicit drugs  Current medications and supplements including opioid prescriptions. Patient is currently taking opioid prescriptions. Information provided to patient regarding non-opioid alternatives. Patient advised to discuss non-opioid treatment plan with their provider. Functional ability and status Nutritional status Physical activity Advanced directives List of other physicians Hospitalizations, surgeries, and ER visits in previous 12 months Vitals Screenings to include cognitive, depression, and falls Referrals and appointments  In addition, I have reviewed and discussed with patient certain preventive protocols, quality metrics, and best practice recommendations. A written personalized care plan for preventive services as well as general preventive health recommendations were provided to patient.     Lonna Cobb, CMA   06/27/2022   Nurse Notes: Approximately 30 minute Non-Face -To-Face Medicare Wellness Visit

## 2022-06-26 NOTE — Patient Instructions (Signed)

## 2022-06-27 ENCOUNTER — Ambulatory Visit (INDEPENDENT_AMBULATORY_CARE_PROVIDER_SITE_OTHER): Payer: PPO

## 2022-06-27 DIAGNOSIS — Z599 Problem related to housing and economic circumstances, unspecified: Secondary | ICD-10-CM

## 2022-06-27 DIAGNOSIS — Z0000 Encounter for general adult medical examination without abnormal findings: Secondary | ICD-10-CM | POA: Diagnosis not present

## 2022-06-28 ENCOUNTER — Other Ambulatory Visit: Payer: Self-pay | Admitting: Neurological Surgery

## 2022-06-29 ENCOUNTER — Telehealth: Payer: Self-pay | Admitting: *Deleted

## 2022-06-29 NOTE — Telephone Encounter (Signed)
   Telephone encounter was:  Successful.  06/29/2022 Name: [name] MRN: [id number] DOB: 07/21/1955  [name] is a 67 y.o. year old female who is a primary care patient of Dana Allan, MD . The community resource team was consulted for assistance with finances  Provided [place] [employer] of government as well as [place] representative and DSS for housing rehab and weatherization  Care guide performed the following interventions: Patient provided with information about care guide support team and interviewed to confirm resource needs.  Follow Up Plan:  No further follow up planned at this time. The patient has been provided with needed resources.  Yehuda Mao Greenauer -[facility], Population Health [phone] [address] [place] , [place] [address] Email : [email or web address]

## 2022-06-29 NOTE — Telephone Encounter (Signed)
   Telephone encounter was:  Unsuccessful.  06/29/2022 Name: Ashley Cross MRN: [id number] DOB: 10/08/1955  Unsuccessful outbound call made today to assist with:  Financial Difficulties related to utilities  Outreach Attempt:  1st Attempt  A HIPAA compliant voice message was left requesting a return call.  Instructed patient to call back at [phone].  Yehuda Mao Greenauer -[facility], Population Health [phone] [address] [place] , [place] [address] Email : [email or web address]

## 2022-07-02 DIAGNOSIS — R748 Abnormal levels of other serum enzymes: Secondary | ICD-10-CM | POA: Insufficient documentation

## 2022-07-02 DIAGNOSIS — R197 Diarrhea, unspecified: Secondary | ICD-10-CM | POA: Insufficient documentation

## 2022-07-02 NOTE — Assessment & Plan Note (Signed)
Chronic elevated LFTs Check GGT

## 2022-07-02 NOTE — Assessment & Plan Note (Signed)
New problem.  Suspect opioid withdrawal given symptoms initiated around similar time.  Low suspicion for infectious etiology given no fevers, abdominal pain or recent sick contacts.  No recent history of antibiotic use.  Symptoms seem to be improving. Will send stool for culture, O&P, C. Difficile Check electrolytes Avoid antidiarrheals  Continue to hydrate Follow-up if no improvement

## 2022-07-02 NOTE — Assessment & Plan Note (Signed)
Chronic.  Stable.  Tolerating. Continue Bystolic 5 mg daily Continue Micardis 80 mg daily C-Met today Follow-up 6 months

## 2022-07-04 ENCOUNTER — Other Ambulatory Visit: Payer: Self-pay | Admitting: Family Medicine

## 2022-07-04 DIAGNOSIS — R0982 Postnasal drip: Secondary | ICD-10-CM

## 2022-07-09 NOTE — Telephone Encounter (Signed)
Are you okay with filling was filled by [facility]. Atrovent nasal spray?

## 2022-07-10 ENCOUNTER — Other Ambulatory Visit: Payer: Self-pay | Admitting: Family Medicine

## 2022-07-10 DIAGNOSIS — R0982 Postnasal drip: Secondary | ICD-10-CM

## 2022-07-10 MED ORDER — IPRATROPIUM BROMIDE 0.06 % NA SOLN
NASAL | 12 refills | Status: DC
Start: 2022-07-10 — End: 2023-08-28

## 2022-07-10 NOTE — Progress Notes (Signed)
     Your surgery and Pre-Admission testing visit will be at [facility] located at [address], [place], [place] [address].  Please let all your doctors (i.e., Primary Care Physician, Cardiologist, Endocrinologist, Pulmonologist) know you are having surgery. You may need clearance for surgery. If you are on blood thinners, notify your surgeon and ask the doctor who prescribed them how long to hold them before surgery.  If you have had a heart test, such as an EKG, stress test, heart ultrasound, etc., or lab work performed outside of [facility], please bring copies of these tests to your Pre-Admission testing, if possible.  These departments may contact you before the day of surgery:  Pre-Service Center - insurance/ billing: [phone] Pharmacy- to review your medications: [phone] Pre-Admission Testing- to set an appointment for your visit: [phone]  (Often, these numbers show up as "SPAM" on your phone)  The Pre-Admission Testing (PAT) visit focuses on Anesthesia for your upcoming surgery.  You do NOT need to fast; take your medications as usual. Please arrive 30 minutes early to allow for parking and admitting.  The visit may last up to an hour. Bring a photo ID and medical insurance card. Reschedule if you are sick. ([phone]) and please, NO children under age 16 at the visit.  During the PAT visit:  We will review your medical and surgical history.   You will receive pre-operative instructions, including the time of arrival at the hospital and surgical start time.  We will review what medication(s) you can take on the day of surgery.  After speaking with the nurse, you will have blood drawn and, if needed, a chest x-ray and EKG.  Most lab results from your doctor are good for 30 days, Hemoglobin A1C is good for 60 days. If you cannot talk to the Pharmacy, bring your medications or a list of them to the PST visit.   Infection control for the [facility] requires: All fingernail and toenail products should be removed before the day of surgery.  (SNS, Acrylic, Gel, Polish, Stickers, Press on, and Poly gel nails.)   Parking information:  Address: [facility] - [address], [place],  [address]  Please look for signs for entrance A off of [address]. Free valet parking is available Monday-Friday 05:30am-06:00pm     

## 2022-07-10 NOTE — Pre-Procedure Instructions (Signed)
Surgical Instructions   Your procedure is scheduled on Wednesday, June 12th. Report to [facility] Main Entrance "A" at 10:10 A.M., then check in with the Admitting office. Any questions or running late day of surgery: call [phone]  Questions prior to your surgery date: call [phone], Monday-Friday, 8am-4pm. If you experience any cold or flu symptoms such as cough, fever, chills, shortness of breath, etc. between now and your scheduled surgery, please notify us at the above number.     Remember:  Do not eat after midnight the night before your surgery  You may drink clear liquids until 09:10 AM the morning of your surgery.   Clear liquids allowed are: Water, Non-Citrus Juices (without pulp), Carbonated Beverages, Clear Tea, Black Coffee Only (NO MILK, CREAM OR POWDERED CREAMER of any kind), and Gatorade.    Take these medicines the morning of surgery with A SIP OF WATER  buPROPion (WELLBUTRIN XL)  DULoxetine (CYMBALTA)  lamoTRIgine (LAMICTAL)  letrozole (FEMARA)  nebivolol (BYSTOLIC)  pregabalin (LYRICA)     May take these medicines IF NEEDED: baclofen (LIORESAL)  ipratropium (ATROVENT) nasal spray oxyCODONE-acetaminophen (PERCOCET)       One week prior to surgery, STOP taking any Aspirin (unless otherwise instructed by your surgeon) Aleve, Naproxen, Ibuprofen, Motrin, Advil, Goody's, BC's, all herbal medications, fish oil, and non-prescription vitamins.                     Do NOT Smoke (Tobacco/Vaping) for 24 hours prior to your procedure.  If you use a CPAP at night, you may bring your mask/headgear for your overnight stay.   You will be asked to remove any contacts, glasses, piercing's, hearing aid's, dentures/partials prior to surgery. Please bring cases for these items if needed.    Patients discharged the day of surgery will not be allowed to drive home, and someone needs to stay with them for 24 hours.  SURGICAL WAITING ROOM VISITATION Patients may have  no more than 2 support people in the waiting area - these visitors may rotate.   Pre-op nurse will coordinate an appropriate time for 1 ADULT support person, who may not rotate, to accompany patient in pre-op.  Children under the age of 61 must have an adult with them who is not the patient and must remain in the main waiting area with an adult.  If the patient needs to stay at the hospital during part of their recovery, the visitor guidelines for inpatient rooms apply.  Please refer to the [facility] website for the visitor guidelines for any additional information.   If you received a COVID test during your pre-op visit  it is requested that you wear a mask when out in public, stay away from anyone that may not be feeling well and notify your surgeon if you develop symptoms. If you have been in contact with anyone that has tested positive in the last 10 days please notify you surgeon.      Pre-operative 5 CHG Bath Instructions   You can play a key role in reducing the risk of infection after surgery. Your skin needs to be as free of germs as possible. You can reduce the number of germs on your skin by washing with CHG (chlorhexidine gluconate) soap before surgery. CHG is an antiseptic soap that kills germs and continues to kill germs even after washing.   DO NOT use if you have an allergy to chlorhexidine/CHG or antibacterial soaps. If your skin becomes reddened or irritated,  stop using the CHG and notify one of our RNs at [phone].   Please shower with the CHG soap starting 4 days before surgery using the following schedule:     Please keep in mind the following:  DO NOT shave, including legs and underarms, starting the day of your first shower.   You may shave your face at any point before/day of surgery.  Place clean sheets on your bed the day you start using CHG soap. Use a clean washcloth (not used since being washed) for each shower. DO NOT sleep with pets once you start  using the CHG.   CHG Shower Instructions:  If you choose to wash your hair and private area, wash first with your normal shampoo/soap.  After you use shampoo/soap, rinse your hair and body thoroughly to remove shampoo/soap residue.  Turn the water OFF and apply about 3 tablespoons (45 ml) of CHG soap to a CLEAN washcloth.  Apply CHG soap ONLY FROM YOUR NECK DOWN TO YOUR TOES (washing for 3-5 minutes)  DO NOT use CHG soap on face, private areas, open wounds, or sores.  Pay special attention to the area where your surgery is being performed.  If you are having back surgery, having someone wash your back for you may be helpful. Wait 2 minutes after CHG soap is applied, then you may rinse off the CHG soap.  Pat dry with a clean towel  Put on clean clothes/pajamas   If you choose to wear lotion, please use ONLY the CHG-compatible lotions on the back of this paper.   Additional instructions for the day of surgery: DO NOT APPLY any lotions, deodorants, cologne, or perfumes.   Do not bring valuables to the hospital. [facility] is not responsible for any belongings/valuables. Do not wear nail polish, gel polish, artificial nails, or any other type of covering on natural nails (fingers and toes) Do not wear jewelry or makeup Put on clean/comfortable clothes.  Please brush your teeth.  Ask your nurse before applying any prescription medications to the skin.     CHG Compatible Lotions   Aveeno Moisturizing lotion  Cetaphil Moisturizing Cream  Cetaphil Moisturizing Lotion  Clairol Herbal Essence Moisturizing Lotion, Dry Skin  Clairol Herbal Essence Moisturizing Lotion, Extra Dry Skin  Clairol Herbal Essence Moisturizing Lotion, Normal Skin  Curel Age Defying Therapeutic Moisturizing Lotion with Alpha Hydroxy  Curel Extreme Care Body Lotion  Curel Soothing Hands Moisturizing Hand Lotion  Curel Therapeutic Moisturizing Cream, Fragrance-Free  Curel Therapeutic Moisturizing Lotion,  Fragrance-Free  Curel Therapeutic Moisturizing Lotion, Original Formula  Eucerin Daily Replenishing Lotion  Eucerin Dry Skin Therapy Plus Alpha Hydroxy Crme  Eucerin Dry Skin Therapy Plus Alpha Hydroxy Lotion  Eucerin Original Crme  Eucerin Original Lotion  Eucerin Plus Crme Eucerin Plus Lotion  Eucerin TriLipid Replenishing Lotion  Keri Anti-Bacterial Hand Lotion  Keri Deep Conditioning Original Lotion Dry Skin Formula Softly Scented  Keri Deep Conditioning Original Lotion, Fragrance Free Sensitive Skin Formula  Keri Lotion Fast Absorbing Fragrance Free Sensitive Skin Formula  Keri Lotion Fast Absorbing Softly Scented Dry Skin Formula  Keri Original Lotion  Keri Skin Renewal Lotion Keri Silky Smooth Lotion  Keri Silky Smooth Sensitive Skin Lotion  Nivea Body Creamy Conditioning Oil  Nivea Body Extra Enriched Teacher, adult education Moisturizing Lotion Nivea Crme  Nivea Skin Firming Lotion  NutraDerm 30 Skin Lotion  NutraDerm Skin Lotion  NutraDerm Therapeutic Skin Cream  NutraDerm Therapeutic Skin Lotion  ProShield Protective Hand Cream  Provon moisturizing lotion  Please read over the following fact sheets that you were given.

## 2022-07-11 ENCOUNTER — Encounter (HOSPITAL_COMMUNITY): Payer: Self-pay

## 2022-07-11 ENCOUNTER — Encounter (HOSPITAL_COMMUNITY)
Admission: RE | Admit: 2022-07-11 | Discharge: 2022-07-11 | Disposition: A | Discharge status: Home / Self Care | Payer: Medicare HMO | Source: Ambulatory Visit | Attending: Neurological Surgery | Admitting: Neurological Surgery

## 2022-07-11 ENCOUNTER — Other Ambulatory Visit: Payer: Self-pay

## 2022-07-11 VITALS — BP 158/67 | HR 75 | Temp 98.4°F | Resp 18 | Ht 64.0 in | Wt 125.7 lb

## 2022-07-11 DIAGNOSIS — I517 Cardiomegaly: Secondary | ICD-10-CM | POA: Insufficient documentation

## 2022-07-11 DIAGNOSIS — Z01818 Encounter for other preprocedural examination: Secondary | ICD-10-CM | POA: Insufficient documentation

## 2022-07-11 DIAGNOSIS — I700 Atherosclerosis of aorta: Secondary | ICD-10-CM | POA: Diagnosis not present

## 2022-07-11 LAB — BASIC METABOLIC PANEL
Anion gap: 12 (ref 5–15)
BUN: 7 mg/dL — ABNORMAL LOW (ref 8–23)
CO2: 27 mmol/L (ref 22–32)
Calcium: 9.5 mg/dL (ref 8.9–10.3)
Chloride: 100 mmol/L (ref 98–111)
Creatinine, Ser: 0.75 mg/dL (ref 0.44–1.00)
GFR, Estimated: >60 mL/min (ref >60)
Glucose, Bld: 99 mg/dL (ref 70–99)
Potassium: 3.2 mmol/L — ABNORMAL LOW (ref 3.5–5.1)
Sodium: 139 mmol/L (ref 135–145)

## 2022-07-11 LAB — CBC
HCT: 41.5 % (ref 36.0–46.0)
Hemoglobin: 13.4 g/dL (ref 12.0–15.0)
MCH: 31.7 pg (ref 26.0–34.0)
MCHC: 32.3 g/dL (ref 30.0–36.0)
MCV: 98.1 fL (ref 80.0–100.0)
Platelets: 189 10*3/uL (ref 150–400)
RBC: 4.23 MIL/uL (ref 3.87–5.11)
RDW: 14.6 % (ref 11.5–15.5)
WBC: 6.3 10*3/uL (ref 4.0–10.5)
nRBC: 0 % (ref 0.0–0.2)

## 2022-07-11 LAB — SURGICAL PCR SCREEN
MRSA, PCR: NEGATIVE
Staphylococcus aureus: POSITIVE — AB

## 2022-07-11 LAB — PROTIME-INR
INR: 1 (ref 0.8–1.2)
Prothrombin Time: 12.9 seconds (ref 11.4–15.2)

## 2022-07-11 NOTE — Progress Notes (Signed)
PCP - Dr. Tanya Walsh Cardiologist - denies  PPM/ICD - denies   Chest x-ray - 11/17/20 EKG - 07/11/22 Stress Test - denies ECHO - 08/29/20 Cardiac Cath - denies  Sleep Study - denies   DM- denies  ASA/Blood Thinner Instructions: n/a   ERAS Protcol - yes, no drink   COVID TEST- n/a   Anesthesia review: no  Patient denies shortness of breath, fever, cough and chest pain at PAT appointment   All instructions explained to the patient, with a verbal understanding of the material. Patient agrees to go over the instructions while at home for a better understanding. The opportunity to ask questions was provided.   

## 2022-07-11 NOTE — Progress Notes (Signed)
PCP - Dr. Dana Allan Cardiologist - denies  PPM/ICD - denies   Chest x-ray - 11/17/20 EKG - 07/11/22 Stress Test - denies ECHO - 08/29/20 Cardiac Cath - denies  Sleep Study - denies   DM- denies  ASA/Blood Thinner Instructions: n/a   ERAS Protcol - yes, no drink   COVID TEST- n/a   Anesthesia review: no  Patient denies shortness of breath, fever, cough and chest pain at PAT appointment   All instructions explained to the patient, with a verbal understanding of the material. Patient agrees to go over the instructions while at home for a better understanding. The opportunity to ask questions was provided.

## 2022-07-18 ENCOUNTER — Ambulatory Visit (HOSPITAL_COMMUNITY): Admission: RE | Admit: 2022-07-18 | Payer: Medicare HMO | Source: Home / Self Care | Admitting: Neurological Surgery

## 2022-07-18 SURGERY — ANTERIOR CERVICAL DECOMPRESSION/DISCECTOMY FUSION 1 LEVEL
Anesthesia: General

## 2022-07-25 ENCOUNTER — Other Ambulatory Visit: Payer: Self-pay | Admitting: Family Medicine

## 2022-07-25 ENCOUNTER — Other Ambulatory Visit: Payer: Self-pay | Admitting: Internal Medicine

## 2022-07-25 DIAGNOSIS — I10 Essential (primary) hypertension: Secondary | ICD-10-CM

## 2022-07-25 MED ORDER — TELMISARTAN 80 MG PO TABS
80.0000 mg | ORAL_TABLET | Freq: Every day | ORAL | 3 refills | Status: DC
Start: 2022-07-25 — End: 2023-03-27

## 2022-07-27 ENCOUNTER — Other Ambulatory Visit: Payer: Self-pay | Admitting: Family Medicine

## 2022-07-27 DIAGNOSIS — F39 Unspecified mood [affective] disorder: Secondary | ICD-10-CM

## 2022-07-27 MED ORDER — BUPROPION HCL ER (XL) 150 MG PO TB24: 150.0000 mg | ORAL_TABLET | Freq: Every day | ORAL | 3 refills | Status: DC

## 2022-07-27 NOTE — Telephone Encounter (Signed)
Spoke to pt and she stated that she is no longer seeing the Psychiatrist, but she also stated that Dr French Ana use to prescribe them to her

## 2022-08-08 ENCOUNTER — Encounter (HOSPITAL_COMMUNITY): Payer: Self-pay

## 2022-08-08 NOTE — Progress Notes (Signed)
PCP - Dr Dana Allan Cardiologist - n/a Behavioral Health - Dr Jomarie Longs Neurology - Dr Windell Norfolk  Chest x-ray - 11/17/20 (2V) EKG - 07/11/22 Stress Test - n/a ECHO - 08/29/20 Cardiac Cath - n/a  ICD Pacemaker/Loop - n/a  Sleep Study -  n/a CPAP - none  Diabetes Type - n/a  NPO  Anesthesia review: Yes  STOP now taking any Aspirin (unless otherwise instructed by your surgeon), Aleve, Naproxen, Ibuprofen, Motrin, Advil, Goody's, BC's, all herbal medications, fish oil, and all vitamins.   Coronavirus Screening Do you have any of the following symptoms:  Cough yes/no: No Fever (>100.55F)  yes/no: No Runny nose Yes-occasional Sore throat yes/no: No Difficulty breathing/shortness of breath  yes/no: No  Have you traveled in the last 14 days and where? yes/no: No  Patient verbalized understanding of instructions that were given to them at the PAT appointment. Patient was also instructed that they will need to review over the PAT instructions again at home before surgery.    No surgical orders at PAT appt. but requested.  Patient stated that instructions for diet were NPO per MD.

## 2022-08-08 NOTE — Progress Notes (Signed)
Surgical Instructions    Your procedure is scheduled on Wednesday, 08/22/22.  Report to [facility] Main Entrance "A" at 10 A.M., then check in with the Admitting office.  Call this number if you have problems the morning of surgery:  [phone]   If you have any questions prior to your surgery date call [phone]: Open Monday-Friday 8am-4pm If you experience any cold or flu symptoms such as cough, fever, chills, shortness of breath, etc. between now and your scheduled surgery, please notify us at the above number     Remember:  Do not eat or drink after midnight the night before your surgery-Tuesday     Take these medicines the morning of surgery with A SIP OF WATER:  buPROPion (WELLBUTRIN XL  DULoxetine (CYMBALTA)  lamoTRIgine (LAMICTAL)  nebivolol (BYSTOLIC)  pregabalin (LYRICA)  letrozole (FEMARA)  baclofen (LIORESAL) if needed ATROVENT nasal spray if needed PERCOCET if needed   As of today, STOP taking any Aspirin (unless otherwise instructed by your surgeon) Aleve, Naproxen, Ibuprofen, Motrin, Advil, Goody's, BC's, all herbal medications, fish oil, and all vitamins.           Do not wear jewelry or makeup. Do not wear lotions, powders, perfumes or deodorant. Do not shave 48 hours prior to surgery.   Do not bring valuables to the hospital. Do not wear nail polish, gel polish, artificial nails, or any other type of covering on natural nails (fingers and toes) If you have artificial nails or gel coating that need to be removed by a nail salon, please have this removed prior to surgery. Artificial nails or gel coating may interfere with anesthesia's ability to adequately monitor your vital signs.  [facility] is not responsible for any belongings or valuables.    Do NOT Smoke (Tobacco/Vaping)  24 hours prior to your procedure  If you use a CPAP at night, you may bring your mask for your overnight stay.   Contacts, glasses, hearing aids, dentures or partials may not be  worn into surgery, please bring cases for these belongings   For patients admitted to the hospital, discharge time will be determined by your treatment team.   Patients discharged the day of surgery will not be allowed to drive home, and someone needs to stay with them for 24 hours.   SURGICAL WAITING ROOM VISITATION Patients having surgery or a procedure may have no more than 2 support people in the waiting area - these visitors may rotate.   Children under the age of 10 must have an adult with them who is not the patient. If the patient needs to stay at the hospital during part of their recovery, the visitor guidelines for inpatient rooms apply. Pre-op nurse will coordinate an appropriate time for 1 support person to accompany patient in pre-op.  This support person may not rotate.   Please refer to [email or web address] for the visitor guidelines for Inpatients (after your surgery is over and you are in a regular room).    Special instructions:    Oral Hygiene is also important to reduce your risk of infection.  Remember - BRUSH YOUR TEETH THE MORNING OF SURGERY WITH YOUR REGULAR TOOTHPASTE   [place]- Preparing For Surgery  Before surgery, you can play an important role. Because skin is not sterile, your skin needs to be as free of germs as possible. You can reduce the number of germs on your skin by washing with CHG (chlorahexidine gluconate) Soap before surgery.  CHG is an antiseptic  cleaner which kills germs and bonds with the skin to continue killing germs even after washing.     Please do not use if you have an allergy to CHG or antibacterial soaps. If your skin becomes reddened/irritated stop using the CHG.  Do not shave (including legs and underarms) for at least 48 hours prior to first CHG shower. It is OK to shave your face.  Please follow these instructions carefully.     Shower the [employer] SURGERY-Tuesday and the  MORNING OF SURGERY-Wednesday with CHG Soap.   If you chose to wash your hair, wash your hair first as usual with your normal shampoo. After you shampoo, rinse your hair and body thoroughly to remove the shampoo.  Then [employer] and genitals (private parts) with your normal soap and rinse thoroughly to remove soap.  After that Use CHG Soap as you would any other liquid soap. You can apply CHG directly to the skin and wash gently with a scrungie or a clean washcloth.   Apply the CHG Soap to your body ONLY FROM THE NECK DOWN.  Do not use on open wounds or open sores. Avoid contact with your eyes, ears, mouth and genitals (private parts). Wash Face and genitals (private parts)  with your normal soap.   Wash thoroughly, paying special attention to the area where your surgery will be performed.  Thoroughly rinse your body with warm water from the neck down.  DO NOT shower/wash with your normal soap after using and rinsing off the CHG Soap.  Pat yourself dry with a CLEAN TOWEL.  Wear CLEAN PAJAMAS to bed the night before surgery  Place CLEAN SHEETS on your bed the night before your surgery  DO NOT SLEEP WITH PETS.   Day of Surgery: Wednesday  Take a shower with CHG soap. Wear Clean/Comfortable clothing the morning of surgery Do not apply any deodorants/lotions.   Remember to brush your teeth WITH YOUR REGULAR TOOTHPASTE.    If you received a COVID test during your pre-op visit, it is requested that you wear a mask when out in public, stay away from anyone that may not be feeling well, and notify your surgeon if you develop symptoms. If you have been in contact with anyone that has tested positive in the last 10 days, please notify your surgeon.    Please read over the following fact sheets that you were given.

## 2022-08-13 ENCOUNTER — Encounter (HOSPITAL_COMMUNITY)
Admission: RE | Admit: 2022-08-13 | Discharge: 2022-08-13 | Disposition: A | Discharge status: Home / Self Care | Payer: PPO | Source: Ambulatory Visit | Attending: Neurological Surgery | Admitting: Neurological Surgery

## 2022-08-13 ENCOUNTER — Other Ambulatory Visit: Payer: Self-pay

## 2022-08-13 ENCOUNTER — Encounter (HOSPITAL_COMMUNITY): Payer: Self-pay

## 2022-08-13 VITALS — BP 99/83 | HR 66 | Temp 98.6°F | Resp 17 | Ht 64.0 in | Wt 121.0 lb

## 2022-08-13 DIAGNOSIS — I10 Essential (primary) hypertension: Secondary | ICD-10-CM | POA: Insufficient documentation

## 2022-08-13 DIAGNOSIS — Z87891 Personal history of nicotine dependence: Secondary | ICD-10-CM | POA: Insufficient documentation

## 2022-08-13 DIAGNOSIS — J449 Chronic obstructive pulmonary disease, unspecified: Secondary | ICD-10-CM | POA: Diagnosis not present

## 2022-08-13 DIAGNOSIS — Z01812 Encounter for preprocedural laboratory examination: Secondary | ICD-10-CM | POA: Insufficient documentation

## 2022-08-13 DIAGNOSIS — G8929 Other chronic pain: Secondary | ICD-10-CM | POA: Diagnosis not present

## 2022-08-13 DIAGNOSIS — Z853 Personal history of malignant neoplasm of breast: Secondary | ICD-10-CM | POA: Insufficient documentation

## 2022-08-13 DIAGNOSIS — Z01818 Encounter for other preprocedural examination: Secondary | ICD-10-CM

## 2022-08-13 DIAGNOSIS — G959 Disease of spinal cord, unspecified: Secondary | ICD-10-CM | POA: Diagnosis not present

## 2022-08-13 HISTORY — DX: Anemia, unspecified: D64.9

## 2022-08-13 HISTORY — DX: Alcohol use, unspecified, uncomplicated: F10.90

## 2022-08-13 HISTORY — DX: Other specified health status: Z78.9

## 2022-08-13 LAB — BASIC METABOLIC PANEL
Anion gap: 10 (ref 5–15)
BUN: <5 mg/dL — ABNORMAL LOW (ref 8–23)
CO2: 25 mmol/L (ref 22–32)
Calcium: 9.2 mg/dL (ref 8.9–10.3)
Chloride: 96 mmol/L — ABNORMAL LOW (ref 98–111)
Creatinine, Ser: 0.61 mg/dL (ref 0.44–1.00)
GFR, Estimated: >60 mL/min (ref >60)
Glucose, Bld: 94 mg/dL (ref 70–99)
Potassium: 3.8 mmol/L (ref 3.5–5.1)
Sodium: 131 mmol/L — ABNORMAL LOW (ref 135–145)

## 2022-08-13 LAB — CBC
HCT: 43.4 % (ref 36.0–46.0)
Hemoglobin: 14.3 g/dL (ref 12.0–15.0)
MCH: 32.1 pg (ref 26.0–34.0)
MCHC: 32.9 g/dL (ref 30.0–36.0)
MCV: 97.5 fL (ref 80.0–100.0)
Platelets: 205 10*3/uL (ref 150–400)
RBC: 4.45 MIL/uL (ref 3.87–5.11)
RDW: 13.4 % (ref 11.5–15.5)
WBC: 6 10*3/uL (ref 4.0–10.5)
nRBC: 0 % (ref 0.0–0.2)

## 2022-08-13 LAB — SURGICAL PCR SCREEN
MRSA, PCR: NEGATIVE
Staphylococcus aureus: POSITIVE — AB

## 2022-08-14 ENCOUNTER — Encounter (HOSPITAL_COMMUNITY): Payer: Self-pay
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
## 2022-08-14 NOTE — Anesthesia Preprocedure Evaluation (Addendum)
Anesthesia Evaluation  Patient identified by MRN, date of birth, ID band Patient awake    Reviewed: Allergy & Precautions, NPO status , Patient's Chart, lab work & pertinent test results  History of Anesthesia Complications Negative for: history of anesthetic complications  Airway Mallampati: III  TM Distance: >3 FB Neck ROM: Limited    Dental  (+) Dental Advisory Given   Pulmonary COPD, Current SmokerPatient did not abstain from smoking.   breath sounds clear to auscultation       Cardiovascular hypertension, Pt. on medications and Pt. on home beta blockers + Peripheral Vascular Disease   Rhythm:Regular     Neuro/Psych  PSYCHIATRIC DISORDERS Anxiety Depression       GI/Hepatic negative GI ROS, Neg liver ROS,,,  Endo/Other    Renal/GU Renal disease     Musculoskeletal  (+) Arthritis ,    Abdominal   Peds  Hematology  (+) Blood dyscrasia, anemia   Anesthesia Other Findings   Reproductive/Obstetrics                             Anesthesia Physical Anesthesia Plan  ASA: 2  Anesthesia Plan: General   Post-op Pain Management: Ofirmev IV (intra-op)*   Induction: Intravenous  PONV Risk Score and Plan: 2 and Ondansetron and Dexamethasone  Airway Management Planned: Oral ETT and Video Laryngoscope Planned  Additional Equipment: None  Intra-op Plan:   Post-operative Plan: Extubation in OR  Informed Consent: I have reviewed the patients History and Physical, chart, labs and discussed the procedure including the risks, benefits and alternatives for the proposed anesthesia with the patient or authorized representative who has indicated his/her understanding and acceptance.     Dental advisory given  Plan Discussed with: CRNA  Anesthesia Plan Comments: (PAT note written by Shonna Chock, PA-C.  )       Anesthesia Quick Evaluation

## 2022-08-22 ENCOUNTER — Ambulatory Visit (HOSPITAL_COMMUNITY): Payer: PPO | Admitting: Vascular Surgery

## 2022-08-22 ENCOUNTER — Encounter (HOSPITAL_COMMUNITY)
Admission: RE | Disposition: A | Discharge status: Home / Self Care | Payer: Self-pay | Source: Home / Self Care | Attending: Neurological Surgery

## 2022-08-22 ENCOUNTER — Encounter (HOSPITAL_COMMUNITY): Payer: Self-pay | Admitting: Neurological Surgery

## 2022-08-22 ENCOUNTER — Ambulatory Visit (HOSPITAL_COMMUNITY): Payer: PPO

## 2022-08-22 ENCOUNTER — Observation Stay (HOSPITAL_COMMUNITY)
Admission: RE | Admit: 2022-08-22 | Discharge: 2022-08-23 | Disposition: A | Discharge status: Home / Self Care | Payer: PPO | Attending: Neurological Surgery | Admitting: Neurological Surgery

## 2022-08-22 ENCOUNTER — Other Ambulatory Visit: Payer: Self-pay

## 2022-08-22 ENCOUNTER — Ambulatory Visit (HOSPITAL_BASED_OUTPATIENT_CLINIC_OR_DEPARTMENT_OTHER): Payer: PPO

## 2022-08-22 DIAGNOSIS — G959 Disease of spinal cord, unspecified: Secondary | ICD-10-CM | POA: Diagnosis not present

## 2022-08-22 DIAGNOSIS — J449 Chronic obstructive pulmonary disease, unspecified: Secondary | ICD-10-CM | POA: Insufficient documentation

## 2022-08-22 DIAGNOSIS — Z853 Personal history of malignant neoplasm of breast: Secondary | ICD-10-CM | POA: Diagnosis not present

## 2022-08-22 DIAGNOSIS — M47892 Other spondylosis, cervical region: Secondary | ICD-10-CM | POA: Diagnosis not present

## 2022-08-22 DIAGNOSIS — F17210 Nicotine dependence, cigarettes, uncomplicated: Secondary | ICD-10-CM | POA: Diagnosis not present

## 2022-08-22 DIAGNOSIS — M47812 Spondylosis without myelopathy or radiculopathy, cervical region: Secondary | ICD-10-CM | POA: Diagnosis not present

## 2022-08-22 DIAGNOSIS — M4802 Spinal stenosis, cervical region: Secondary | ICD-10-CM | POA: Insufficient documentation

## 2022-08-22 DIAGNOSIS — Z79899 Other long term (current) drug therapy: Secondary | ICD-10-CM | POA: Insufficient documentation

## 2022-08-22 DIAGNOSIS — I10 Essential (primary) hypertension: Secondary | ICD-10-CM

## 2022-08-22 DIAGNOSIS — Z981 Arthrodesis status: Principal | ICD-10-CM

## 2022-08-22 HISTORY — PX: ANTERIOR CERVICAL DECOMP/DISCECTOMY FUSION: SHX1161

## 2022-08-22 SURGERY — ANTERIOR CERVICAL DECOMPRESSION/DISCECTOMY FUSION 1 LEVEL
Anesthesia: General

## 2022-08-22 MED ORDER — OXYCODONE HCL 5 MG/5ML PO SOLN: 5.0000 mg | Freq: Once | ORAL | Status: AC | PRN

## 2022-08-22 MED ORDER — LIDOCAINE 2% (20 MG/ML) 5 ML SYRINGE
INTRAMUSCULAR | Status: DC | PRN
  Administered 2022-08-22: 40 mg via INTRAVENOUS

## 2022-08-22 MED ORDER — POTASSIUM CHLORIDE IN NACL 20-0.9 MEQ/L-% IV SOLN: INTRAVENOUS | Status: DC

## 2022-08-22 MED ORDER — 0.9 % SODIUM CHLORIDE (POUR BTL) OPTIME
TOPICAL | Status: DC | PRN
  Administered 2022-08-22: 1000 mL

## 2022-08-22 MED ORDER — BUPIVACAINE HCL (PF) 0.25 % IJ SOLN
INTRAMUSCULAR | Status: AC
  Filled 2022-08-22: qty 30

## 2022-08-22 MED ORDER — OXYCODONE-ACETAMINOPHEN 5-325 MG PO TABS
1.0000 | ORAL_TABLET | ORAL | Status: DC | PRN
  Administered 2022-08-22 – 2022-08-23 (×3): 1 via ORAL
  Filled 2022-08-22 (×3): qty 1

## 2022-08-22 MED ORDER — DEXAMETHASONE SODIUM PHOSPHATE 10 MG/ML IJ SOLN
INTRAMUSCULAR | Status: AC
  Filled 2022-08-22: qty 1

## 2022-08-22 MED ORDER — ROCURONIUM BROMIDE 10 MG/ML (PF) SYRINGE
PREFILLED_SYRINGE | INTRAVENOUS | Status: AC
  Filled 2022-08-22: qty 10

## 2022-08-22 MED ORDER — OXYCODONE HCL 5 MG PO TABS
5.0000 mg | ORAL_TABLET | Freq: Once | ORAL | Status: AC | PRN
  Administered 2022-08-22: 5 mg via ORAL

## 2022-08-22 MED ORDER — BUPIVACAINE HCL (PF) 0.25 % IJ SOLN
INTRAMUSCULAR | Status: DC | PRN
  Administered 2022-08-22: 3 mL

## 2022-08-22 MED ORDER — OXYCODONE-ACETAMINOPHEN 10-325 MG PO TABS: 1.0000 | ORAL_TABLET | ORAL | Status: DC | PRN

## 2022-08-22 MED ORDER — MIDAZOLAM HCL 2 MG/2ML IJ SOLN
INTRAMUSCULAR | Status: AC
  Filled 2022-08-22: qty 2

## 2022-08-22 MED ORDER — ACETAMINOPHEN 650 MG RE SUPP: 650.0000 mg | RECTAL | Status: DC | PRN

## 2022-08-22 MED ORDER — LACTATED RINGERS IV SOLN: INTRAVENOUS | Status: DC

## 2022-08-22 MED ORDER — FUROSEMIDE 40 MG PO TABS: 40.0000 mg | ORAL_TABLET | Freq: Every day | ORAL | Status: DC

## 2022-08-22 MED ORDER — SODIUM CHLORIDE 0.9% FLUSH: 3.0000 mL | Freq: Two times a day (BID) | INTRAVENOUS | Status: DC

## 2022-08-22 MED ORDER — BACLOFEN 10 MG PO TABS
10.0000 mg | ORAL_TABLET | Freq: Three times a day (TID) | ORAL | Status: DC | PRN
  Administered 2022-08-22 – 2022-08-23 (×2): 10 mg via ORAL
  Filled 2022-08-22 (×2): qty 1

## 2022-08-22 MED ORDER — ONDANSETRON HCL 4 MG/2ML IJ SOLN
INTRAMUSCULAR | Status: DC | PRN
  Administered 2022-08-22: 4 mg via INTRAVENOUS

## 2022-08-22 MED ORDER — ONDANSETRON HCL 4 MG PO TABS: 4.0000 mg | ORAL_TABLET | Freq: Four times a day (QID) | ORAL | Status: DC | PRN

## 2022-08-22 MED ORDER — SENNA 8.6 MG PO TABS
1.0000 | ORAL_TABLET | Freq: Two times a day (BID) | ORAL | Status: DC
  Administered 2022-08-22 – 2022-08-23 (×2): 8.6 mg via ORAL
  Filled 2022-08-22 (×2): qty 1

## 2022-08-22 MED ORDER — CHLORHEXIDINE GLUCONATE 0.12 % MT SOLN
15.0000 mL | Freq: Once | OROMUCOSAL | Status: AC
  Administered 2022-08-22: 15 mL via OROMUCOSAL
  Filled 2022-08-22: qty 15

## 2022-08-22 MED ORDER — SODIUM CHLORIDE 0.9% FLUSH: 3.0000 mL | INTRAVENOUS | Status: DC | PRN

## 2022-08-22 MED ORDER — MIDAZOLAM HCL 2 MG/2ML IJ SOLN
INTRAMUSCULAR | Status: DC | PRN
  Administered 2022-08-22: 2 mg via INTRAVENOUS

## 2022-08-22 MED ORDER — DEXAMETHASONE 4 MG PO TABS
4.0000 mg | ORAL_TABLET | Freq: Four times a day (QID) | ORAL | Status: DC
  Administered 2022-08-22 – 2022-08-23 (×3): 4 mg via ORAL
  Filled 2022-08-22 (×3): qty 1

## 2022-08-22 MED ORDER — ONDANSETRON HCL 4 MG/2ML IJ SOLN: 4.0000 mg | Freq: Four times a day (QID) | INTRAMUSCULAR | Status: DC | PRN

## 2022-08-22 MED ORDER — DULOXETINE HCL 30 MG PO CPEP
60.0000 mg | ORAL_CAPSULE | Freq: Every day | ORAL | Status: DC
  Administered 2022-08-23: 60 mg via ORAL
  Filled 2022-08-22: qty 2

## 2022-08-22 MED ORDER — ACETAMINOPHEN 10 MG/ML IV SOLN
INTRAVENOUS | Status: AC
  Filled 2022-08-22: qty 100

## 2022-08-22 MED ORDER — PROPOFOL 10 MG/ML IV BOLUS
INTRAVENOUS | Status: AC
  Filled 2022-08-22: qty 20

## 2022-08-22 MED ORDER — FENTANYL CITRATE (PF) 250 MCG/5ML IJ SOLN
INTRAMUSCULAR | Status: DC | PRN
  Administered 2022-08-22 (×2): 50 ug via INTRAVENOUS

## 2022-08-22 MED ORDER — IRBESARTAN 75 MG PO TABS
75.0000 mg | ORAL_TABLET | Freq: Every day | ORAL | Status: DC
  Administered 2022-08-22: 75 mg via ORAL
  Filled 2022-08-22: qty 1

## 2022-08-22 MED ORDER — SUGAMMADEX SODIUM 200 MG/2ML IV SOLN
INTRAVENOUS | Status: DC | PRN
  Administered 2022-08-22: 200 mg via INTRAVENOUS

## 2022-08-22 MED ORDER — MORPHINE SULFATE (PF) 2 MG/ML IV SOLN: 2.0000 mg | INTRAVENOUS | Status: DC | PRN

## 2022-08-22 MED ORDER — ROCURONIUM BROMIDE 10 MG/ML (PF) SYRINGE
PREFILLED_SYRINGE | INTRAVENOUS | Status: DC | PRN
  Administered 2022-08-22: 40 mg via INTRAVENOUS
  Administered 2022-08-22: 60 mg via INTRAVENOUS

## 2022-08-22 MED ORDER — DEXAMETHASONE SODIUM PHOSPHATE 4 MG/ML IJ SOLN: 4.0000 mg | Freq: Four times a day (QID) | INTRAMUSCULAR | Status: DC

## 2022-08-22 MED ORDER — ORAL CARE MOUTH RINSE: 15.0000 mL | Freq: Once | OROMUCOSAL | Status: AC

## 2022-08-22 MED ORDER — BUPROPION HCL ER (XL) 150 MG PO TB24
150.0000 mg | ORAL_TABLET | Freq: Every day | ORAL | Status: DC
  Administered 2022-08-23: 150 mg via ORAL
  Filled 2022-08-22: qty 1

## 2022-08-22 MED ORDER — PROPOFOL 10 MG/ML IV BOLUS
INTRAVENOUS | Status: DC | PRN
  Administered 2022-08-22: 100 mg via INTRAVENOUS

## 2022-08-22 MED ORDER — LIDOCAINE 2% (20 MG/ML) 5 ML SYRINGE
INTRAMUSCULAR | Status: AC
  Filled 2022-08-22: qty 5

## 2022-08-22 MED ORDER — OXYCODONE HCL 5 MG PO TABS
5.0000 mg | ORAL_TABLET | ORAL | Status: DC | PRN
  Administered 2022-08-22 – 2022-08-23 (×3): 5 mg via ORAL
  Filled 2022-08-22 (×3): qty 1

## 2022-08-22 MED ORDER — FENTANYL CITRATE (PF) 100 MCG/2ML IJ SOLN
INTRAMUSCULAR | Status: AC
  Filled 2022-08-22: qty 2

## 2022-08-22 MED ORDER — FENTANYL CITRATE (PF) 100 MCG/2ML IJ SOLN
25.0000 ug | INTRAMUSCULAR | Status: DC | PRN
  Administered 2022-08-22 (×2): 25 ug via INTRAVENOUS

## 2022-08-22 MED ORDER — PHENYLEPHRINE 80 MCG/ML (10ML) SYRINGE FOR IV PUSH (FOR BLOOD PRESSURE SUPPORT)
PREFILLED_SYRINGE | INTRAVENOUS | Status: DC | PRN
  Administered 2022-08-22: 40 ug via INTRAVENOUS
  Administered 2022-08-22: 80 ug via INTRAVENOUS
  Administered 2022-08-22: 40 ug via INTRAVENOUS

## 2022-08-22 MED ORDER — CEFAZOLIN SODIUM-DEXTROSE 2-4 GM/100ML-% IV SOLN
2.0000 g | Freq: Three times a day (TID) | INTRAVENOUS | Status: AC
  Administered 2022-08-22: 2 g via INTRAVENOUS
  Filled 2022-08-22: qty 100

## 2022-08-22 MED ORDER — THROMBIN 5000 UNITS EX SOLR
OROMUCOSAL | Status: DC | PRN
  Administered 2022-08-22: 5 mL via TOPICAL

## 2022-08-22 MED ORDER — MONTELUKAST SODIUM 10 MG PO TABS
10.0000 mg | ORAL_TABLET | Freq: Every day | ORAL | Status: DC
  Administered 2022-08-22: 10 mg via ORAL
  Filled 2022-08-22: qty 1

## 2022-08-22 MED ORDER — THROMBIN 5000 UNITS EX SOLR
CUTANEOUS | Status: AC
  Filled 2022-08-22: qty 5000

## 2022-08-22 MED ORDER — ONDANSETRON HCL 4 MG/2ML IJ SOLN
INTRAMUSCULAR | Status: AC
  Filled 2022-08-22: qty 2

## 2022-08-22 MED ORDER — LETROZOLE 2.5 MG PO TABS
2.5000 mg | ORAL_TABLET | Freq: Every day | ORAL | Status: DC
  Administered 2022-08-23: 2.5 mg via ORAL
  Filled 2022-08-22: qty 1

## 2022-08-22 MED ORDER — CEFAZOLIN SODIUM-DEXTROSE 2-3 GM-%(50ML) IV SOLR
INTRAVENOUS | Status: DC | PRN
  Administered 2022-08-22: 2 g via INTRAVENOUS

## 2022-08-22 MED ORDER — SODIUM CHLORIDE 0.9 % IV SOLN: 250.0000 mL | INTRAVENOUS | Status: DC

## 2022-08-22 MED ORDER — FENTANYL CITRATE (PF) 250 MCG/5ML IJ SOLN
INTRAMUSCULAR | Status: AC
  Filled 2022-08-22: qty 5

## 2022-08-22 MED ORDER — OXYCODONE HCL 5 MG PO TABS
ORAL_TABLET | ORAL | Status: AC
  Filled 2022-08-22: qty 1

## 2022-08-22 MED ORDER — DEXAMETHASONE SODIUM PHOSPHATE 10 MG/ML IJ SOLN
INTRAMUSCULAR | Status: DC | PRN
  Administered 2022-08-22: 10 mg via INTRAVENOUS

## 2022-08-22 MED ORDER — PREGABALIN 75 MG PO CAPS
150.0000 mg | ORAL_CAPSULE | Freq: Three times a day (TID) | ORAL | Status: DC
  Administered 2022-08-22 – 2022-08-23 (×2): 150 mg via ORAL
  Filled 2022-08-22 (×2): qty 2

## 2022-08-22 MED ORDER — LAMOTRIGINE 100 MG PO TABS
200.0000 mg | ORAL_TABLET | Freq: Every day | ORAL | Status: DC
  Administered 2022-08-23: 200 mg via ORAL
  Filled 2022-08-22: qty 2

## 2022-08-22 MED ORDER — ACETAMINOPHEN 500 MG PO TABS: 1000.0000 mg | ORAL_TABLET | Freq: Once | ORAL | Status: DC | PRN

## 2022-08-22 MED ORDER — PHENOL 1.4 % MT LIQD: 1.0000 | OROMUCOSAL | Status: DC | PRN

## 2022-08-22 MED ORDER — NEBIVOLOL HCL 5 MG PO TABS
5.0000 mg | ORAL_TABLET | Freq: Every day | ORAL | Status: DC
  Administered 2022-08-23: 5 mg via ORAL
  Filled 2022-08-22: qty 1

## 2022-08-22 MED ORDER — MENTHOL 3 MG MT LOZG: 1.0000 | LOZENGE | OROMUCOSAL | Status: DC | PRN

## 2022-08-22 MED ORDER — ACETAMINOPHEN 325 MG PO TABS: 650.0000 mg | ORAL_TABLET | ORAL | Status: DC | PRN

## 2022-08-22 MED ORDER — PHENYLEPHRINE HCL-NACL 20-0.9 MG/250ML-% IV SOLN
INTRAVENOUS | Status: DC | PRN
  Administered 2022-08-22: 10 ug/min via INTRAVENOUS

## 2022-08-22 MED ORDER — ACETAMINOPHEN 160 MG/5ML PO SOLN: 1000.0000 mg | Freq: Once | ORAL | Status: DC | PRN

## 2022-08-22 MED ORDER — ACETAMINOPHEN 10 MG/ML IV SOLN
1000.0000 mg | Freq: Once | INTRAVENOUS | Status: DC | PRN
  Administered 2022-08-22: 1000 mg via INTRAVENOUS

## 2022-08-22 SURGICAL SUPPLY — 50 items
APL SKNCLS STERI-STRIP NONHPOA (GAUZE/BANDAGES/DRESSINGS) ×1
BAG COUNTER SPONGE SURGICOUNT (BAG) ×1 IMPLANT
BAG SPNG CNTER NS LX DISP (BAG) ×1
BASKET BONE COLLECTION (BASKET) IMPLANT
BENZOIN TINCTURE PRP APPL 2/3 (GAUZE/BANDAGES/DRESSINGS) ×1 IMPLANT
BIT DRILL 2.3 STRL 12 (BIT) IMPLANT
BUR CARBIDE MATCH 3.0 (BURR) ×1 IMPLANT
CANISTER SUCT 3000ML PPV (MISCELLANEOUS) ×1 IMPLANT
DRAPE C-ARM 42X72 X-RAY (DRAPES) ×2 IMPLANT
DRAPE LAPAROTOMY 100X72 PEDS (DRAPES) ×1 IMPLANT
DRAPE MICROSCOPE SLANT 54X150 (MISCELLANEOUS) IMPLANT
DRSG OPSITE 4X5.5 SM (GAUZE/BANDAGES/DRESSINGS) IMPLANT
DURAPREP 6ML APPLICATOR 50/CS (WOUND CARE) ×1 IMPLANT
ELECT COATED BLADE 2.86 ST (ELECTRODE) ×1 IMPLANT
ELECT REM PT RETURN 9FT ADLT (ELECTROSURGICAL) ×1
ELECTRODE REM PT RTRN 9FT ADLT (ELECTROSURGICAL) ×1 IMPLANT
GAUZE 4X4 16PLY ~~LOC~~+RFID DBL (SPONGE) IMPLANT
GLOVE BIO SURGEON STRL SZ7 (GLOVE) IMPLANT
GLOVE BIO SURGEON STRL SZ8 (GLOVE) ×1 IMPLANT
GLOVE BIOGEL PI IND STRL 7.0 (GLOVE) IMPLANT
GOWN STRL REUS W/ TWL LRG LVL3 (GOWN DISPOSABLE) IMPLANT
GOWN STRL REUS W/ TWL XL LVL3 (GOWN DISPOSABLE) IMPLANT
GOWN STRL REUS W/TWL 2XL LVL3 (GOWN DISPOSABLE) ×1 IMPLANT
GOWN STRL REUS W/TWL LRG LVL3 (GOWN DISPOSABLE) ×1
GOWN STRL REUS W/TWL XL LVL3 (GOWN DISPOSABLE) ×2
HEMOSTAT POWDER KIT SURGIFOAM (HEMOSTASIS) ×1 IMPLANT
KIT BASIN OR (CUSTOM PROCEDURE TRAY) ×1 IMPLANT
KIT TURNOVER KIT B (KITS) ×1 IMPLANT
NDL HYPO 25X1 1.5 SAFETY (NEEDLE) ×1 IMPLANT
NDL SPNL 20GX3.5 QUINCKE YW (NEEDLE) ×1 IMPLANT
NEEDLE HYPO 25X1 1.5 SAFETY (NEEDLE) ×1 IMPLANT
NEEDLE SPNL 20GX3.5 QUINCKE YW (NEEDLE) ×1 IMPLANT
NS IRRIG 1000ML POUR BTL (IV SOLUTION) ×1 IMPLANT
PACK LAMINECTOMY NEURO (CUSTOM PROCEDURE TRAY) ×1 IMPLANT
PAD ARMBOARD 7.5X6 YLW CONV (MISCELLANEOUS) ×1 IMPLANT
PIN DISTRACTION 14MM (PIN) IMPLANT
PLATE ANT CERV INSG 22 1L (Plate) IMPLANT
SCREW VA SINGLE LEAD 4X14 (Screw) ×4 IMPLANT
SCREW VA SINGLE LEAD 4X14 ST (Screw) IMPLANT
SOL ELECTROSURG ANTI STICK (MISCELLANEOUS) ×1
SOLUTION ELECTROSURG ANTI STCK (MISCELLANEOUS) ×1 IMPLANT
SPACER ASSEM CERV LORD 7M (Spacer) IMPLANT
SPONGE INTESTINAL PEANUT (DISPOSABLE) ×1 IMPLANT
SPONGE SURGIFOAM ABS GEL SZ50 (HEMOSTASIS) IMPLANT
STRIP CLOSURE SKIN 1/2X4 (GAUZE/BANDAGES/DRESSINGS) ×1 IMPLANT
SUT VIC AB 3-0 SH 8-18 (SUTURE) ×1 IMPLANT
SUT VICRYL 4-0 PS2 18IN ABS (SUTURE) IMPLANT
TOWEL GREEN STERILE (TOWEL DISPOSABLE) ×1 IMPLANT
TOWEL GREEN STERILE FF (TOWEL DISPOSABLE) ×1 IMPLANT
WATER STERILE IRR 1000ML POUR (IV SOLUTION) ×1 IMPLANT

## 2022-08-22 NOTE — Transfer of Care (Signed)
Immediate Anesthesia Transfer of Care Note  Patient: Ashley Cross  Procedure(s) Performed: Anterior Cervical Decompression Fusion  - Cervical three-Cervical four  Patient Location: PACU  Anesthesia Type:General  Level of Consciousness: drowsy  Airway & Oxygen Therapy: Patient Spontanous Breathing and Patient connected to face mask oxygen  Post-op Assessment: Report given to RN and Post -op Vital signs reviewed and stable  Post vital signs: Reviewed and stable  Last Vitals:  Vitals Value Taken Time  BP    Temp    Pulse 77 08/22/22 1522  Resp    SpO2 100 % 08/22/22 1522  Vitals shown include unfiled device data.  Last Pain:  Vitals:   08/22/22 1013  TempSrc:   PainSc: 7       Patients Stated Pain Goal: 2 (08/22/22 1013)  Complications: No notable events documented.

## 2022-08-22 NOTE — H&P (Signed)
Subjective:   Patient is a 66 y.o. female admitted for cervical stenosis c3-4. The patient first presented to me with complaints of neck pain, shooting pains in the arm(s), and loss of strength of the arm(s). Onset of symptoms was several months ago. The pain is described as aching and occurs all day. The pain is rated severe, and is located in the neck and radiates to the arms. The symptoms have been progressive. Symptoms are exacerbated by extending head backwards, and are relieved by none.  Previous work up includes MRI of cervical spine, results: spinal stenosis.  Past Medical History:  Diagnosis Date   Alcohol use    Anemia    hx   Anxiety    Aortic regurgitation 08/29/2020   Breast cancer (HCC)    Left breast(nipple mass) 2018   Cervical stenosis of spine    Chicken pox    Chronic back pain    Colon polyps    COPD (chronic obstructive pulmonary disease) (HCC)    Cyst of left nipple    Depression    Hypertension    Insomnia    hx   Personal history of radiation therapy     Past Surgical History:  Procedure Laterality Date   BACK SURGERY     x 3 (2010 x 2; 2013 x 1)  Dr. Yetta Barre   BREAST BIOPSY Left    core- neg   BREAST LUMPECTOMY Left 2018   BREAST SURGERY Left    breast biopsy   CERVICAL CONE BIOPSY     CERVICAL FUSION     times 2   COLONOSCOPY WITH PROPOFOL N/A 04/18/2015   Procedure: COLONOSCOPY WITH PROPOFOL;  Surgeon: Elnita Maxwell, MD;  Location: [facility] ENDOSCOPY;  Service: Endoscopy;  Laterality: N/A;   ELBOW SURGERY     2014 b/l elbows per pt ulnar nerve    ELBOW SURGERY     x2    EPIDURAL BLOCK INJECTION     Dr. Ollen Bowl  (multiple)   MASS EXCISION Left 04/12/2016   Procedure: EXCISION OF LEFT NIPPLE MASS;  Surgeon: Almond Lint, MD;  Location: [facility];  Service: General;  Laterality: Left;   NECK SURGERY     POSTERIOR CERVICAL FUSION/FORAMINOTOMY  03/07/2011   Procedure: POSTERIOR CERVICAL FUSION/FORAMINOTOMY LEVEL 4;  Surgeon: Tia Alert, MD;  [facility];  Service: Neurosurgery;  Laterality: N/A;  cervical three to thoracic one posterior cervical fusion    SENTINEL NODE BIOPSY Left 05/29/2016   Procedure: LEFT SENTINEL LYMPH NODE BIOPSY;  Surgeon: Almond Lint, MD;  Location[facility];  Service: General;  Laterality: Left;   TONSILLECTOMY      Allergies  Allergen Reactions   Anastrozole     Pt did not feel like herself    Augmentin [Amoxicillin-Pot Clavulanate]     Diarrhea    Norvasc [Amlodipine]     Swelling    Topamax [Topiramate]     Didn't like the way made feel more irritatble/sad    Sulfa Antibiotics Itching and Rash   Sulfonamide Derivatives Itching and Rash    Social History   Tobacco Use   Smoking status: Every Day    Current packs/day: 0.50    Average packs/day: 0.5 packs/day for 30.0 years (15.0 ttl pk-yrs)    Types: Cigarettes   Smokeless tobacco: Never   Tobacco comments:    Vapes when she doesn't have cigarettes  Substance Use Topics   Alcohol use: Yes    Alcohol/week: 7.0  standard drinks of alcohol    Types: 7 Standard drinks or equivalent per week    Comment: wine/beer    Family History  Problem Relation Age of Onset   Breast cancer Mother 45   Hypertension Mother    Cancer Mother        breast dx'ed 62    Cancer Father        prostate   Hypertension Father    Drug abuse Daughter    Hypertension Maternal Grandmother    Breast cancer Cousin    Prior to Admission medications   Medication Sig Start Date End Date Taking? Authorizing Provider  baclofen (LIORESAL) 10 MG tablet Take 1 tablet (10 mg total) by mouth 3 (three) times daily as needed for muscle spasms. 06/19/22  Yes Dana Allan, MD  buPROPion (WELLBUTRIN XL) 150 MG 24 hr tablet Take 1 tablet (150 mg total) by mouth daily. 07/27/22  Yes Dana Allan, MD  cholecalciferol (VITAMIN D3) 25 MCG (1000 UNIT) tablet Take 1,000 Units by mouth daily.   Yes [provider]  Cyanocobalamin (VITAMIN B-12 PO) Take  1 tablet by mouth daily.   Yes [provider]  DULoxetine (CYMBALTA) 60 MG capsule Take 1 capsule (60 mg total) by mouth daily. 09/01/21  Yes McLean-Scocuzza, Pasty Spillers, MD  furosemide (LASIX) 40 MG tablet Take 40 mg by mouth daily. 07/25/22  Yes [provider]  ipratropium (ATROVENT) 0.06 % nasal spray PLACE 2 SPRAYS INTO BOTH NOSTRILS 3 TIMES DAILY AS NEEDED Patient taking differently: Place 2 sprays into both nostrils 3 (three) times daily as needed for rhinitis. 07/10/22  Yes Dana Allan, MD  lamoTRIgine (LAMICTAL) 200 MG tablet TAKE 1 TABLET BY MOUTH DAILY 11/01/21  Yes Windell Norfolk, MD  letrozole (FEMARA) 2.5 MG tablet Take 1 tablet (2.5 mg total) by mouth daily. 09/21/21  Yes Serena Croissant, MD  montelukast (SINGULAIR) 10 MG tablet Take 1 tablet (10 mg total) by mouth at bedtime. 09/01/21  Yes McLean-Scocuzza, Pasty Spillers, MD  nebivolol (BYSTOLIC) 5 MG tablet Take 1 tablet (5 mg total) by mouth daily. 09/01/21  Yes McLean-Scocuzza, Pasty Spillers, MD  oxyCODONE-acetaminophen (PERCOCET) 10-325 MG tablet Take 1 tablet by mouth every 6 (six) hours as needed for pain.   Yes [provider]  pregabalin (LYRICA) 150 MG capsule Take 150 mg by mouth 3 (three) times daily.   Yes [provider]  pyridoxine (B-6) 100 MG tablet Take 100 mg by mouth daily.   Yes [provider]  telmisartan (MICARDIS) 80 MG tablet Take 1 tablet (80 mg total) by mouth daily. 07/25/22  Yes Dana Allan, MD     Review of Systems  Positive ROS: neg  All other systems have been reviewed and were otherwise negative with the exception of those mentioned in the HPI and as above.  Objective: Vital signs in last 24 hours: Temp:  [98.4 F (36.9 C)] 98.4 F (36.9 C) (07/17 0941) Pulse Rate:  [90] 90 (07/17 0941) Resp:  [18] 18 (07/17 0941) BP: (135)/(65) 135/65 (07/17 0941) SpO2:  [99 %] 99 % (07/17 0941) Weight:  [54 kg] 54 kg (07/17 0941)  General Appearance: Alert, cooperative, no  distress, appears stated age Head: Normocephalic, without obvious abnormality, atraumatic Eyes: PERRL, conjunctiva/corneas clear, EOM's intact      Neck: Supple, symmetrical, trachea midline, Back: Symmetric, no curvature, ROM normal, no CVA tenderness Lungs:  respirations unlabored Heart: Regular rate and rhythm Abdomen: Soft, non-tender Extremities: Extremities normal, atraumatic, no cyanosis or  edema Pulses: 2+ and symmetric all extremities Skin: Skin color, texture, turgor normal, no rashes or lesions  NEUROLOGIC:  Mental status: Alert and oriented x4, no aphasia, good attention span, fund of knowledge and memory  Motor Exam - grossly normal Sensory Exam - grossly normal Reflexes: 3+ Coordination - grossly normal Gait - not tested Balance - not tested Cranial Nerves: I: smell Not tested  II: visual acuity  OS: nl    OD: nl  II: visual fields Full to confrontation  II: pupils Equal, round, reactive to light  III,VII: ptosis None  III,IV,VI: extraocular muscles  Full ROM  V: mastication Normal  V: facial light touch sensation  Normal  V,VII: corneal reflex  Present  VII: facial muscle function - upper  Normal  VII: facial muscle function - lower Normal  VIII: hearing Not tested  IX: soft palate elevation  Normal  IX,X: gag reflex Present  XI: trapezius strength  5/5  XI: sternocleidomastoid strength 5/5  XI: neck flexion strength  5/5  XII: tongue strength  Normal    Data Review Lab Results  Component Value Date   WBC 6.0 08/13/2022   HGB 14.3 08/13/2022   HCT 43.4 08/13/2022   MCV 97.5 08/13/2022   PLT 205 08/13/2022   Lab Results  Component Value Date   NA 131 (L) 08/13/2022   K 3.8 08/13/2022   CL 96 (L) 08/13/2022   CO2 25 08/13/2022   BUN <5 (L) 08/13/2022   CREATININE 0.61 08/13/2022   GLUCOSE 94 08/13/2022   Lab Results  Component Value Date   INR 1.0 07/11/2022    Assessment:   Cervical neck pain with herniated nucleus pulposus/  spondylosis/ stenosis at C3-4. Estimated body mass index is 20.43 kg/m as calculated from the following:   Height as of this encounter: 5\' 4"  (1.626 m).   Weight as of this encounter: 54 kg.  Patient has failed conservative therapy. Planned surgery : ACDF C3-4  Plan:   I explained the condition and procedure to the patient and answered any questions.  Patient wishes to proceed with procedure as planned. Understands risks/ benefits/ and expected or typical outcomes.  Tia Alert 08/22/2022 11:36 AM

## 2022-08-22 NOTE — Progress Notes (Signed)
Ambulated  several laps around pacu with staff and tolerated it well.

## 2022-08-22 NOTE — Op Note (Signed)
08/22/2022  3:07 PM  PATIENT:  Ashley Cross  67 y.o. female  PRE-OPERATIVE DIAGNOSIS: Cervical spondylosis with cervical spinal stenosis C3-4 with neck and arm pain  POST-OPERATIVE DIAGNOSIS:  same  PROCEDURE:  1. Decompressive anterior cervical discectomy C3-4, 2. Anterior cervical arthrodesis C3-4 utilizing a 7 mm cortical cancellous structural allograft 3. Anterior cervical plating C3-4 utilizing a ATEC plate  SURGEON:  Marikay Alar, MD  ASSISTANTS: Verlin Dike, FNP  ANESTHESIA:   General  EBL: 20 ml  Total I/O In: 50 [IV Piggyback:50] Out: -   BLOOD ADMINISTERED: none  DRAINS: none  SPECIMEN:  none  INDICATION FOR PROCEDURE: This patient presented with neck pain and arm pain. Imaging showed arthrosis C3-4 after previous posterior cervical fusion, with cervical spondylosis with stenosis at this level.. The patient tried conservative measures without relief. Pain was debilitating. Recommended ACDF with plating. Patient understood the risks, benefits, and alternatives and potential outcomes and wished to proceed.  PROCEDURE DETAILS: Patient was brought to the operating room placed under general endotracheal anesthesia. Patient was placed in the supine position on the operating room table. The neck was prepped with Duraprep and draped in a sterile fashion.   Three cc of local anesthesia was injected and a transverse incision was made on the right side of the neck.  Dissection was carried down thru the subcutaneous tissue and the platysma was  elevated, opened, and undermined with Metzenbaum scissors.  Dissection was then carried out thru an avascular plane leaving the sternocleidomastoid carotid artery and jugular vein laterally and the trachea and esophagus medially with the assistance of my nurse practitioner. The ventral aspect of the vertebral column was identified and a localizing x-ray was taken. The C3-4 level was identified and all in the room agreed with the level.  The longus colli muscles were then elevated and the retractor was placed with the assistance of my nurse practitioner. The annulus was incised and the disc space entered. Discectomy was performed with micro-curettes and pituitary rongeurs. I then used the high-speed drill to drill the endplates down to the level of the posterior longitudinal ligament. The operating microscope was draped and brought into the field provided additional magnification, illumination and visualization. Discectomy was continued posteriorly thru the disc space. Posterior longitudinal ligament was opened with a nerve hook, and then removed along with disc herniation and osteophytes, decompressing the spinal canal and thecal sac. We then continued to remove osteophytic overgrowth and disc material decompressing the neural foramina and exiting nerve roots bilaterally. The scope was angled up and down to help decompress and undercut the vertebral bodies. Once the decompression was completed we could pass a nerve hook circumferentially to assure adequate decompression in the midline and in the neural foramina. So by both visualization and palpation we felt we had an adequate decompression of the neural elements. We then measured the height of the intravertebral disc space and selected a 7 millimeter structural allograft. It was then gently positioned in the intravertebral disc space(s) and countersunk. I then used a 22 mm ATEC plate and placed 14 mm variable angle screws into the vertebral bodies of each level and locked them into position. The wound was irrigated with bacitracin solution, checked for hemostasis which was established and confirmed. Once meticulous hemostasis was achieved, we then proceeded with closure with the assistance of my nurse practitioner. The platysma was closed with interrupted 3-0 undyed Vicryl suture, the subcuticular layer was closed with interrupted 3-0 undyed Vicryl suture. The skin edges were  approximated with  steristrips. The drapes were removed. A sterile dressing was applied. The patient was then awakened from general anesthesia and transferred to the recovery room in stable condition. At the end of the procedure all sponge, needle and instrument counts were correct.   PLAN OF CARE: Admit for overnight observation  PATIENT DISPOSITION:  PACU - hemodynamically stable.   Delay start of Pharmacological VTE agent (>24hrs) due to surgical blood loss or risk of bleeding:  yes

## 2022-08-22 NOTE — Progress Notes (Signed)
Patient had  a fall on Sunday and hit the back of her head. Stated "Everything went black".  Did not seek medical attention, but neighbor was present. Has bump on Left side of her head.

## 2022-08-22 NOTE — Anesthesia Procedure Notes (Signed)
Procedure Name: Intubation Date/Time: 08/22/2022 1:45 PM  Performed by: Samara Deist, CRNAPre-anesthesia Checklist: Patient identified, Emergency Drugs available, Suction available and Patient being monitored Patient Re-evaluated:Patient Re-evaluated prior to induction Oxygen Delivery Method: Circle System Utilized Preoxygenation: Pre-oxygenation with 100% oxygen Induction Type: IV induction Ventilation: Mask ventilation without difficulty Laryngoscope Size: Glidescope and 3 Grade View: Grade I Tube type: Oral Tube size: 7.0 mm Number of attempts: 1 Airway Equipment and Method: Rigid stylet and Video-laryngoscopy Placement Confirmation: ETT inserted through vocal cords under direct vision, positive ETCO2 and breath sounds checked- equal and bilateral Secured at: 21 cm Tube secured with: Tape Dental Injury: Teeth and Oropharynx as per pre-operative assessment  Comments: Performed by sam foster srna

## 2022-08-23 ENCOUNTER — Encounter (HOSPITAL_COMMUNITY): Payer: Self-pay | Admitting: Neurological Surgery

## 2022-08-23 DIAGNOSIS — M47892 Other spondylosis, cervical region: Secondary | ICD-10-CM | POA: Diagnosis not present

## 2022-08-23 MED ORDER — FENTANYL CITRATE (PF) 250 MCG/5ML IJ SOLN
INTRAMUSCULAR | Status: AC
  Filled 2022-08-23: qty 5

## 2022-08-23 MED ORDER — PROPOFOL 10 MG/ML IV BOLUS
INTRAVENOUS | Status: AC
  Filled 2022-08-23: qty 20

## 2022-08-23 MED ORDER — LIDOCAINE 2% (20 MG/ML) 5 ML SYRINGE
INTRAMUSCULAR | Status: AC
  Filled 2022-08-23: qty 5

## 2022-08-23 MED ORDER — DEXAMETHASONE SODIUM PHOSPHATE 10 MG/ML IJ SOLN
INTRAMUSCULAR | Status: AC
  Filled 2022-08-23: qty 1

## 2022-08-23 MED ORDER — METHOCARBAMOL 750 MG PO TABS: 750.0000 mg | ORAL_TABLET | Freq: Four times a day (QID) | ORAL | 0 refills | Status: DC

## 2022-08-23 MED ORDER — OXYCODONE-ACETAMINOPHEN 5-325 MG PO TABS: 1.0000 | ORAL_TABLET | ORAL | 0 refills | Status: AC | PRN

## 2022-08-23 MED ORDER — ROCURONIUM BROMIDE 10 MG/ML (PF) SYRINGE
PREFILLED_SYRINGE | INTRAVENOUS | Status: AC
  Filled 2022-08-23: qty 10

## 2022-08-23 MED ORDER — ONDANSETRON HCL 4 MG/2ML IJ SOLN
INTRAMUSCULAR | Status: AC
  Filled 2022-08-23: qty 2

## 2022-08-23 NOTE — Progress Notes (Signed)
Patient alert and oriented, voiding adequately, skin clean, dry and intact without evidence of skin break down, or symptoms of complications - no redness or edema noted, only slight tenderness at site.  Patient states pain is manageable at time of discharge. Patient has an appointment with MD in 2 weeks 

## 2022-08-23 NOTE — Evaluation (Signed)
Occupational Therapy Evaluation Patient Details Name: Ashley Cross MRN: [id number] DOB: 1955-03-09 Today's Date: 08/23/2022   History of Present Illness 67 yo F adm for ACDF.  PMH includes: Hx ETOH, HTN, chronic back pain, anxiety.   Clinical Impression   Patient admitted for the procedure above.  PTA she lives alone with her 3 dogs, and has a next door neighbor who is able to provide supportive assist if needed.  Patient presents as a little unsteady, and is very close to her baseline for ADL completion and generalized mobility without an AD.  Patient has a good understanding of cervical precautions, and all questions answered.  No additional OT needs in the acute setting.  Recommend follow up as prescribed by MD.        Recommendations for follow up therapy are one component of a multi-disciplinary discharge planning process, led by the attending physician.  Recommendations may be updated based on patient status, additional functional criteria and insurance authorization.   Assistance Recommended at Discharge Set up Supervision/Assistance  Patient can return home with the following Assist for transportation;Assistance with cooking/housework    Functional Status Assessment  Patient has not had a recent decline in their functional status  Equipment Recommendations  None recommended by OT    Recommendations for Other Services       Precautions / Restrictions Precautions Precautions: Cervical Precaution Booklet Issued: Yes (comment) Precaution Comments: Standard C-spine precaution. Restrictions Weight Bearing Restrictions: No      Mobility Bed Mobility Overal bed mobility: Modified Independent                  Transfers Overall transfer level: Modified independent                        Balance Overall balance assessment: Mild deficits observed, not formally tested                                         ADL either performed or  assessed with clinical judgement   ADL Overall ADL's : At baseline                                             Vision Baseline Vision/History: 1 Wears glasses Patient Visual Report: No change from baseline       Perception     Praxis      Pertinent Vitals/Pain Pain Assessment Pain Assessment: Faces Faces Pain Scale: Hurts little more Pain Location: Incisional Pain Descriptors / Indicators: Aching Pain Intervention(s): Monitored during session     Hand Dominance Right   Extremity/Trunk Assessment Upper Extremity Assessment Upper Extremity Assessment: Overall WFL for tasks assessed   Lower Extremity Assessment Lower Extremity Assessment: Overall WFL for tasks assessed   Cervical / Trunk Assessment Cervical / Trunk Assessment: Neck Surgery   Communication Communication Communication: No difficulties   Cognition Arousal/Alertness: Awake/alert Behavior During Therapy: WFL for tasks assessed/performed Overall Cognitive Status: Within Functional Limits for tasks assessed                                       General Comments   VSS on RA    Exercises  Shoulder Instructions      Home Living Family/patient expects to be discharged to:: Private residence Living Arrangements: Alone Available Help at Discharge: Friend(s);Available PRN/intermittently Type of Home: House Home Access: Stairs to enter [employer] of Steps: 3 Entrance Stairs-Rails: None Home Layout: One level     Bathroom Shower/Tub: Walk-in shower;Tub/shower unit   Teacher, early years/pre: Yes How Accessible: Accessible via walker Home Equipment: Cane - single point          Prior Functioning/Environment Prior Level of Function : Independent/Modified Independent;Driving                        OT Problem List: Pain      OT Treatment/Interventions:      OT Goals(Current goals can be found in the care plan  section) Acute Rehab OT Goals Patient Stated Goal: Returning home today OT Goal Formulation: With patient Time For Goal Achievement: 08/27/22 Potential to Achieve Goals: Good  OT Frequency:      Co-evaluation              AM-PAC OT "6 Clicks" Daily Activity     Outcome Measure Help from another person eating meals?: None Help from another person taking care of personal grooming?: None Help from another person toileting, which includes using toliet, bedpan, or urinal?: None Help from another person bathing (including washing, rinsing, drying)?: None Help from another person to put on and taking off regular upper body clothing?: None Help from another person to put on and taking off regular lower body clothing?: None 6 Click Score: 24   End of Session Nurse Communication: Mobility status  Activity Tolerance: Patient tolerated treatment well Patient left: in bed;with call bell/phone within reach  OT Visit Diagnosis: Unsteadiness on feet (R26.81)                Time: 2130-8657 OT Time Calculation (min): 21 min Charges:  OT General Charges $OT Visit: 1 Visit OT Evaluation $OT Eval Moderate Complexity: 1 Mod  08/23/2022  RP, OTR/L  Acute Rehabilitation Services  Office:  [phone]   Suzanna Obey 08/23/2022, 9:18 AM

## 2022-08-23 NOTE — Discharge Summary (Signed)
Physician Discharge Summary  Patient ID: Ashley Cross MRN: [id number] DOB/AGE: 09-04-1955 67 y.o.  Admit date: 08/22/2022 Discharge date: 08/23/2022  Admission Diagnoses: Cervical spondylosis with cervical spinal stenosis C3-4 with neck and arm pain     Discharge Diagnoses: same   Discharged Condition: good  Hospital Course: The patient was admitted on 08/22/2022 and taken to the operating room where the patient underwent acdf C3-4. The patient tolerated the procedure well and was taken to the recovery room and then to the floor in stable condition. The hospital course was routine. There were no complications. The wound remained clean dry and intact. Pt had appropriate neck soreness. No complaints of arm pain or new N/T/W. The patient remained afebrile with stable vital signs, and tolerated a regular diet. The patient continued to increase activities, and pain was well controlled with oral pain medications.   Consults: None  Significant Diagnostic Studies:  Results for orders placed or performed during the hospital encounter of 08/13/22  Surgical pcr screen   Specimen: Nasal Mucosa; Nasal Swab  Result Value Ref Range   MRSA, PCR NEGATIVE NEGATIVE   Staphylococcus aureus POSITIVE (A) NEGATIVE  CBC  Result Value Ref Range   WBC 6.0 4.0 - 10.5 K/uL   RBC 4.45 3.87 - 5.11 MIL/uL   Hemoglobin 14.3 12.0 - 15.0 g/dL   HCT 53.6 64.4 - 03.4 %   MCV 97.5 80.0 - 100.0 fL   MCH 32.1 26.0 - 34.0 pg   MCHC 32.9 30.0 - 36.0 g/dL   RDW 74.2 59.5 - 63.8 %   Platelets 205 150 - 400 K/uL   nRBC 0.0 0.0 - 0.2 %  Basic metabolic panel  Result Value Ref Range   Sodium 131 (L) 135 - 145 mmol/L   Potassium 3.8 3.5 - 5.1 mmol/L   Chloride 96 (L) 98 - 111 mmol/L   CO2 25 22 - 32 mmol/L   Glucose, Bld 94 70 - 99 mg/dL   BUN <5 (L) 8 - 23 mg/dL   Creatinine, Ser 7.56 0.44 - 1.00 mg/dL   Calcium 9.2 8.9 - 43.3 mg/dL   GFR, Estimated >29 >51 mL/min   Anion gap 10 5 - 15    DG Cervical Spine  1 View  Result Date: 08/22/2022 CLINICAL DATA:  ACDF C3-C4 EXAM: DG CERVICAL SPINE - 1 VIEW COMPARISON:  07/28/2020 FINDINGS: One fluoroscopic image obtained during the performance of the procedure and provided for interpretation only. Image demonstrates interval ACDF at C3-C4, redemonstrated posterior fusion hardware C3-T1 and prior ACDF C5-C6. Fluoroscopy time: 14 seconds Cumulative air kerma: 1.27 mGy IMPRESSION: Intraoperative fluoroscopy for ACDF at C3-C4. Electronically Signed   By: Wiliam Ke M.D.   On: 08/22/2022 17:25   DG C-Arm 1-60 Min-No Report  Result Date: 08/22/2022 Fluoroscopy was utilized by the requesting physician.  No radiographic interpretation.    Antibiotics:  Anti-infectives (From admission, onward)    Start     Dose/Rate Route Frequency Ordered Stop   08/22/22 1700  ceFAZolin (ANCEF) IVPB 2g/100 mL premix        2 g 200 mL/hr over 30 Minutes Intravenous Every 8 hours 08/22/22 1658 08/23/22 0859       Discharge Exam: Blood pressure (!) 114/53, pulse 86, temperature 97.9 F (36.6 C), resp. rate 16, height 5\' 4"  (1.626 m), weight 54 kg, SpO2 95%. Neurologic: Grossly normal Ambulating and voiding well incision cdi   Discharge Medications:   Allergies as of 08/23/2022  Reactions   Anastrozole    Pt did not feel like herself    Augmentin [amoxicillin-pot Clavulanate]    Diarrhea   Norvasc [amlodipine]    Swelling   Topamax [topiramate]    Didn't like the way made feel more irritatble/sad    Sulfa Antibiotics Itching, Rash   Sulfonamide Derivatives Itching, Rash        Medication List     STOP taking these medications    oxyCODONE-acetaminophen 10-325 MG tablet Commonly known as: PERCOCET Replaced by: oxyCODONE-acetaminophen 5-325 MG tablet       TAKE these medications    baclofen 10 MG tablet Commonly known as: LIORESAL Take 1 tablet (10 mg total) by mouth 3 (three) times daily as needed for muscle spasms.   buPROPion 150 MG 24 hr  tablet Commonly known as: WELLBUTRIN XL Take 1 tablet (150 mg total) by mouth daily.   cholecalciferol 25 MCG (1000 UNIT) tablet Commonly known as: VITAMIN D3 Take 1,000 Units by mouth daily.   DULoxetine 60 MG capsule Commonly known as: CYMBALTA Take 1 capsule (60 mg total) by mouth daily.   furosemide 40 MG tablet Commonly known as: LASIX Take 40 mg by mouth daily.   ipratropium 0.06 % nasal spray Commonly known as: ATROVENT PLACE 2 SPRAYS INTO BOTH NOSTRILS 3 TIMES DAILY AS NEEDED What changed:  how much to take how to take this when to take this reasons to take this additional instructions   lamoTRIgine 200 MG tablet Commonly known as: LAMICTAL TAKE 1 TABLET BY MOUTH DAILY   letrozole 2.5 MG tablet Commonly known as: FEMARA Take 1 tablet (2.5 mg total) by mouth daily.   methocarbamol 750 MG tablet Commonly known as: Robaxin-750 Take 1 tablet (750 mg total) by mouth 4 (four) times daily.   montelukast 10 MG tablet Commonly known as: SINGULAIR Take 1 tablet (10 mg total) by mouth at bedtime.   nebivolol 5 MG tablet Commonly known as: BYSTOLIC Take 1 tablet (5 mg total) by mouth daily.   oxyCODONE-acetaminophen 5-325 MG tablet Commonly known as: Percocet Take 1 tablet by mouth every 4 (four) hours as needed for severe pain. Replaces: oxyCODONE-acetaminophen 10-325 MG tablet   pregabalin 150 MG capsule Commonly known as: LYRICA Take 150 mg by mouth 3 (three) times daily.   pyridoxine 100 MG tablet Commonly known as: B-6 Take 100 mg by mouth daily.   telmisartan 80 MG tablet Commonly known as: MICARDIS Take 1 tablet (80 mg total) by mouth daily.   VITAMIN B-12 PO Take 1 tablet by mouth daily.        Disposition: home   Final Dx: acdf C3-4  Discharge Instructions      Remove dressing in 72 hours   Complete by: As directed    Call MD for:  difficulty breathing, headache or visual disturbances   Complete by: As directed    Call MD for:   hives   Complete by: As directed    Call MD for:  persistant dizziness or light-headedness   Complete by: As directed    Call MD for:  persistant nausea and vomiting   Complete by: As directed    Call MD for:  redness, tenderness, or signs of infection (pain, swelling, redness, odor or green/yellow discharge around incision site)   Complete by: As directed    Call MD for:  severe uncontrolled pain   Complete by: As directed    Call MD for:  temperature >100.4   Complete by:  As directed    Diet - low sodium heart healthy   Complete by: As directed    Driving Restrictions   Complete by: As directed    No driving for 2 weeks, no riding in the car for 1 week   Increase activity slowly   Complete by: As directed    Lifting restrictions   Complete by: As directed    No lifting more than 8 lbs          Signed: Tiana Loft Raheel Kunkle 08/23/2022, 7:45 AM

## 2022-08-25 NOTE — Anesthesia Postprocedure Evaluation (Signed)
Anesthesia Post Note  Patient: Ashley Cross  Procedure(s) Performed: Anterior Cervical Decompression Fusion  - Cervical three-Cervical four     Patient location during evaluation: PACU Anesthesia Type: General Level of consciousness: awake and alert Pain management: pain level controlled Vital Signs Assessment: post-procedure vital signs reviewed and stable Respiratory status: spontaneous breathing, nonlabored ventilation, respiratory function stable and patient connected to nasal cannula oxygen Cardiovascular status: blood pressure returned to baseline and stable Postop Assessment: no apparent nausea or vomiting Anesthetic complications: no   No notable events documented.  Last Vitals:  Vitals:   08/23/22 0430 08/23/22 0728  BP: (!) 134/55 (!) 114/53  Pulse: 66 86  Resp: 18 16  Temp: 36.9 C 36.6 C  SpO2: 100% 95%    Last Pain:  Vitals:   08/23/22 1022  TempSrc:   PainSc: 4                  Yarisbel Miranda

## 2022-09-03 DIAGNOSIS — G959 Disease of spinal cord, unspecified: Secondary | ICD-10-CM | POA: Diagnosis not present

## 2022-09-07 ENCOUNTER — Other Ambulatory Visit: Payer: Self-pay | Admitting: Family Medicine

## 2022-09-11 ENCOUNTER — Telehealth: Payer: Self-pay

## 2022-09-11 ENCOUNTER — Telehealth: Payer: Self-pay | Admitting: Hematology and Oncology

## 2022-09-11 NOTE — Telephone Encounter (Signed)
Pt called and LVM regarding a question about scheduling. Attempted pt call pt back and LVM for call back.

## 2022-09-11 NOTE — Telephone Encounter (Signed)
Left patient a message in regards to rescheduled appointment times/dates; left callback number if for reschedule if needed

## 2022-09-14 ENCOUNTER — Other Ambulatory Visit: Payer: Self-pay | Admitting: General Surgery

## 2022-09-14 DIAGNOSIS — Z1231 Encounter for screening mammogram for malignant neoplasm of breast: Secondary | ICD-10-CM

## 2022-09-17 DIAGNOSIS — G959 Disease of spinal cord, unspecified: Secondary | ICD-10-CM | POA: Diagnosis not present

## 2022-09-17 DIAGNOSIS — G894 Chronic pain syndrome: Secondary | ICD-10-CM | POA: Diagnosis not present

## 2022-09-17 DIAGNOSIS — M961 Postlaminectomy syndrome, not elsewhere classified: Secondary | ICD-10-CM | POA: Diagnosis not present

## 2022-09-17 DIAGNOSIS — M5416 Radiculopathy, lumbar region: Secondary | ICD-10-CM | POA: Diagnosis not present

## 2022-09-17 DIAGNOSIS — F1120 Opioid dependence, uncomplicated: Secondary | ICD-10-CM | POA: Diagnosis not present

## 2022-09-24 ENCOUNTER — Telehealth: Payer: Self-pay

## 2022-09-24 ENCOUNTER — Other Ambulatory Visit: Payer: Self-pay | Admitting: Neurology

## 2022-09-24 ENCOUNTER — Ambulatory Visit: Payer: PPO | Admitting: Hematology and Oncology

## 2022-09-24 ENCOUNTER — Other Ambulatory Visit: Payer: Self-pay | Admitting: Internal Medicine

## 2022-09-24 DIAGNOSIS — J309 Allergic rhinitis, unspecified: Secondary | ICD-10-CM

## 2022-09-24 NOTE — Telephone Encounter (Signed)
LAMICTAL REFILLED FOR 1 MONTH. MESSAGE SENT TO PHONE ROOM TO SCHEDULE PATIENT

## 2022-09-25 ENCOUNTER — Other Ambulatory Visit: Payer: Self-pay

## 2022-09-25 MED ORDER — DULOXETINE HCL 60 MG PO CPEP: 60.0000 mg | ORAL_CAPSULE | Freq: Every day | ORAL | 3 refills | Status: AC

## 2022-09-27 ENCOUNTER — Telehealth: Payer: Self-pay | Admitting: Hematology and Oncology

## 2022-09-27 NOTE — Telephone Encounter (Signed)
Rescheduled appointment per patients request. Patient is aware of the changes made to her upcoming appointment. 

## 2022-10-11 ENCOUNTER — Inpatient Hospital Stay: Payer: PPO | Admitting: Hematology and Oncology

## 2022-10-16 ENCOUNTER — Ambulatory Visit: Payer: PPO

## 2022-10-17 ENCOUNTER — Ambulatory Visit
Admission: RE | Admit: 2022-10-17 | Discharge: 2022-10-17 | Disposition: A | Discharge status: Home / Self Care | Payer: PPO | Source: Ambulatory Visit | Attending: General Surgery | Admitting: General Surgery

## 2022-10-17 DIAGNOSIS — Z1231 Encounter for screening mammogram for malignant neoplasm of breast: Secondary | ICD-10-CM | POA: Diagnosis not present

## 2022-10-23 ENCOUNTER — Inpatient Hospital Stay: Payer: PPO | Admitting: Hematology and Oncology

## 2022-10-23 NOTE — Assessment & Plan Note (Deleted)
04/12/2016 Left breast biopsy/ lumpectomy: IDC grade 10.8 cm with DCIS grade 1, margins negative, ER 100%, PR 100%, HER-2 negative, Ki-67 15%   PET/CT scan: Negative for metastatic disease Sentinel lymph node study: 1/4 sentinel nodes positive with extracapsular extension   Mammaprint low-risk: 10 year risk of recurrence 10% Adjuvant radiation therapy 07/24/2016- 09/07/2016   Treatment plan: Adjuvant antiestrogen therapy with anastrozole 1 mg daily 7 years switched to letrozole December 2018   Letrozole toxicities: Very occasional hot flashes.  Denies any major trouble from this.     Breast cancer surveillance: 1.  Breast exam  Not done 2. mammogram 10/19/2022: Benign breast density category B 3. MRI abdomen: Mild intrahepatic biliary duct dilatation, pancreatic pseudocyst   Her daughter passed away in 11-09-17 and she is still grieving from that.   Return to clinic in 1 year for follow-up

## 2022-10-26 ENCOUNTER — Other Ambulatory Visit: Payer: Self-pay | Admitting: Hematology and Oncology

## 2022-10-26 DIAGNOSIS — Z170 Estrogen receptor positive status [ER+]: Secondary | ICD-10-CM

## 2022-10-30 ENCOUNTER — Other Ambulatory Visit: Payer: Self-pay

## 2022-10-30 DIAGNOSIS — I10 Essential (primary) hypertension: Secondary | ICD-10-CM

## 2022-10-30 MED ORDER — NEBIVOLOL HCL 5 MG PO TABS: 5.0000 mg | ORAL_TABLET | Freq: Every day | ORAL | 3 refills | Status: DC

## 2022-10-31 ENCOUNTER — Inpatient Hospital Stay: Payer: PPO | Attending: Hematology and Oncology | Admitting: Hematology and Oncology

## 2022-10-31 NOTE — Assessment & Plan Note (Deleted)
04/12/2016 Left breast biopsy/ lumpectomy: IDC grade 10.8 cm with DCIS grade 1, margins negative, ER 100%, PR 100%, HER-2 negative, Ki-67 15%   PET/CT scan: Negative for metastatic disease Sentinel lymph node study: 1/4 sentinel nodes positive with extracapsular extension   Mammaprint low-risk: 10 year risk of recurrence 10% Adjuvant radiation therapy 07/24/2016- 09/07/2016   Treatment plan: Adjuvant antiestrogen therapy with anastrozole 1 mg daily 7 years switched to letrozole December 2018   Letrozole toxicities: Very occasional hot flashes.  Denies any major trouble from this.     Breast cancer surveillance: 1.  Breast exam  Not done 2. mammogram 10/19/2022: Benign breast density category B   Her daughter passed away in 2017/11/17 and she is still grieving from that.   Return to clinic in 1 year for follow-up

## 2022-11-09 ENCOUNTER — Telehealth: Payer: Self-pay | Admitting: Hematology and Oncology

## 2022-11-09 NOTE — Telephone Encounter (Signed)
Per Staff Message on 11/09/22 I called patient and left a voice mail for the new rescheduled appointment. I will mail a reminder notice.

## 2022-11-13 DIAGNOSIS — G959 Disease of spinal cord, unspecified: Secondary | ICD-10-CM | POA: Diagnosis not present

## 2022-12-06 ENCOUNTER — Inpatient Hospital Stay: Payer: PPO | Attending: Hematology and Oncology | Admitting: Hematology and Oncology

## 2022-12-06 VITALS — BP 130/56 | HR 69 | Temp 97.9°F | Resp 18 | Ht 64.0 in | Wt 119.8 lb

## 2022-12-06 DIAGNOSIS — E559 Vitamin D deficiency, unspecified: Secondary | ICD-10-CM | POA: Insufficient documentation

## 2022-12-06 DIAGNOSIS — Z923 Personal history of irradiation: Secondary | ICD-10-CM | POA: Diagnosis not present

## 2022-12-06 DIAGNOSIS — C50412 Malignant neoplasm of upper-outer quadrant of left female breast: Secondary | ICD-10-CM | POA: Diagnosis not present

## 2022-12-06 DIAGNOSIS — C50512 Malignant neoplasm of lower-outer quadrant of left female breast: Secondary | ICD-10-CM

## 2022-12-06 DIAGNOSIS — Z79899 Other long term (current) drug therapy: Secondary | ICD-10-CM | POA: Insufficient documentation

## 2022-12-06 DIAGNOSIS — Z79811 Long term (current) use of aromatase inhibitors: Secondary | ICD-10-CM | POA: Diagnosis not present

## 2022-12-06 DIAGNOSIS — Z170 Estrogen receptor positive status [ER+]: Secondary | ICD-10-CM | POA: Diagnosis not present

## 2022-12-06 DIAGNOSIS — F17210 Nicotine dependence, cigarettes, uncomplicated: Secondary | ICD-10-CM | POA: Insufficient documentation

## 2022-12-06 DIAGNOSIS — F39 Unspecified mood [affective] disorder: Secondary | ICD-10-CM | POA: Diagnosis not present

## 2022-12-06 DIAGNOSIS — Z87891 Personal history of nicotine dependence: Secondary | ICD-10-CM | POA: Diagnosis not present

## 2022-12-06 MED ORDER — BUPROPION HCL ER (XL) 300 MG PO TB24
300.0000 mg | ORAL_TABLET | Freq: Every day | ORAL | 3 refills | Status: DC
Start: 2022-12-06 — End: 2023-11-27

## 2022-12-06 NOTE — Progress Notes (Signed)
Patient Care Team: Dana Allan, MD as PCP - General (Family Medicine) Margaretann Loveless, MD (Internal Medicine) Axel Filler Larna Daughters, NP as Nurse Practitioner (Hematology and Oncology) Serena Croissant, MD as Consulting Physician (Hematology and Oncology) Almond Lint, MD as Consulting Physician (General Surgery) Dorothy Puffer, MD as Consulting Physician (Radiation Oncology)  DIAGNOSIS:  Encounter Diagnoses  Name Primary?   Malignant neoplasm of lower-outer quadrant of left breast of female, estrogen receptor positive (HCC) Yes   Mood disorder (HCC)     SUMMARY OF ONCOLOGIC HISTORY: Oncology History  Malignant neoplasm of lower-outer quadrant of left breast of female, estrogen receptor positive (HCC)  04/12/2016 Initial Diagnosis   Left breast biopsy/ lumpectomy: IDC grade 1, 0.8 cm with DCIS grade 1, margins negative, ER 100%, PR 100%, HER-2 negative, Ki-67 15%, T1b NX stage I a   05/21/2016 PET scan   Mild focal hypermetabolic in the left breast, no findings of metastatic disease. mild hypermetabolism is along the right costosternal junction favoring degenerative changes posttraumatic changes involving the right inferior scapular right sacrum bilateral pelvis and right lateral third and fourth ribs   05/29/2016 Surgery   1/4 sentinel lymph node positive with extracapsular extension    06/01/2016 Miscellaneous   Mammaprint low-risk: Average 10 year risk of recurrence 10%; Mammaprint index +0.389   07/24/2016 - 09/07/2016 Radiation Therapy   Adjuvant radiation therapy   09/2016 -  Anti-estrogen oral therapy   Anastrozole, changed to Letrozole after 1-2 months (patient unable to tolerate Anastrozole)     CHIEF COMPLIANT: Follow-up on letrozole therapy  History of Present Illness   The patient, with a history of breast cancer and currently on letrozole, presents with discomfort due to raised pores on her chest and fluid discharge from the nipple. She reports no pain associated with  these symptoms. She has been on letrozole for six years and is expected to continue for at least one more year.  The patient also reports struggling with depression and requests an increase in her Wellbutrin dosage. She mentions that life has been hard, with financial struggles and the loss of her daughter contributing to her depression. She also mentions that she has agoraphobia and struggles to leave her house, despite having recently acquired a job.  The patient has successfully quit smoking, occasionally using a vape as a substitute. She expresses a desire to reduce her vaping over time. She also expresses a love for animals and horse riding, indicating that these activities bring her joy and could potentially help her overcome her agoraphobia.         ALLERGIES:  is allergic to anastrozole, augmentin [amoxicillin-pot clavulanate], norvasc [amlodipine], topamax [topiramate], sulfa antibiotics, and sulfonamide derivatives.  MEDICATIONS:  Current Outpatient Medications  Medication Sig Dispense Refill   albuterol (VENTOLIN HFA) 108 (90 Base) MCG/ACT inhaler INHALE 1 TO 2 PUFFS EVERY 4 HOURS AS NEEDED FOR WHEEZING OR SHORTNESS OF BREATH 18 g 2   baclofen (LIORESAL) 10 MG tablet Take 1 tablet (10 mg total) by mouth 3 (three) times daily as needed for muscle spasms. 30 each 1   buPROPion (WELLBUTRIN XL) 300 MG 24 hr tablet Take 1 tablet (300 mg total) by mouth daily. 90 tablet 3   cholecalciferol (VITAMIN D3) 25 MCG (1000 UNIT) tablet Take 1,000 Units by mouth daily.     DULoxetine (CYMBALTA) 60 MG capsule Take 1 capsule (60 mg total) by mouth daily. 90 capsule 3   furosemide (LASIX) 40 MG tablet TAKE ONE TABLET EVERY DAY AS  NEEDED FOR EDEMA 90 tablet 0   ipratropium (ATROVENT) 0.06 % nasal spray PLACE 2 SPRAYS INTO BOTH NOSTRILS 3 TIMES DAILY AS NEEDED (Patient taking differently: Place 2 sprays into both nostrils 3 (three) times daily as needed for rhinitis.) 15 mL 12   lamoTRIgine (LAMICTAL)  200 MG tablet TAKE 1 TABLET BY MOUTH DAILY 30 tablet 0   letrozole (FEMARA) 2.5 MG tablet TAKE ONE TABLET (2.5 MG) BY MOUTH EVERY DAY 90 tablet 3   montelukast (SINGULAIR) 10 MG tablet TAKE ONE TABLET AT BEDTIME 90 tablet 3   nebivolol (BYSTOLIC) 5 MG tablet Take 1 tablet (5 mg total) by mouth daily. 90 tablet 3   oxyCODONE-acetaminophen (PERCOCET) 5-325 MG tablet Take 1 tablet by mouth every 4 (four) hours as needed for severe pain. 20 tablet 0   pregabalin (LYRICA) 150 MG capsule Take 150 mg by mouth 3 (three) times daily.     pyridoxine (B-6) 100 MG tablet Take 100 mg by mouth daily.     telmisartan (MICARDIS) 80 MG tablet Take 1 tablet (80 mg total) by mouth daily. 90 tablet 3   No current facility-administered medications for this visit.    PHYSICAL EXAMINATION: ECOG PERFORMANCE STATUS: 1 - Symptomatic but completely ambulatory  Vitals:   12/06/22 1111  BP: (!) 130/56  Pulse: 69  Resp: 18  Temp: 97.9 F (36.6 C)  SpO2: 100%   Filed Weights   12/06/22 1111  Weight: 119 lb 12.8 oz (54.3 kg)    Physical Exam   BREAST: No discharge, occasional fluid noted from nipple. Raised pore-like areas with white discharge on one side, recent onset of similar changes on the other side.      (exam performed in the presence of a chaperone)  LABORATORY DATA:  I have reviewed the data as listed    Latest Ref Rng & Units 08/13/2022    2:30 PM 07/11/2022   11:50 AM 06/19/2022   12:13 PM  CMP  Glucose 70 - 99 mg/dL 94  99  87   BUN 8 - 23 mg/dL 5  7  9    Creatinine 0.44 - 1.00 mg/dL 4.09  8.11  9.14   Sodium 135 - 145 mmol/L 131  139  136   Potassium 3.5 - 5.1 mmol/L 3.8  3.2  4.4   Chloride 98 - 111 mmol/L 96  100  101   CO2 22 - 32 mmol/L 25  27  26    Calcium 8.9 - 10.3 mg/dL 9.2  9.5  9.8   Total Protein 6.0 - 8.3 g/dL   6.8   Total Bilirubin 0.2 - 1.2 mg/dL   0.5   Alkaline Phos 39 - 117 U/L   121   AST 0 - 37 U/L   28   ALT 0 - 35 U/L   25     Lab Results  Component Value  Date   WBC 6.0 08/13/2022   HGB 14.3 08/13/2022   HCT 43.4 08/13/2022   MCV 97.5 08/13/2022   PLT 205 08/13/2022   NEUTROABS 3.0 04/24/2022    ASSESSMENT & PLAN:  Malignant neoplasm of lower-outer quadrant of left breast of female, estrogen receptor positive (HCC) 04/12/2016 Left breast biopsy/ lumpectomy: IDC grade 10.8 cm with DCIS grade 1, margins negative, ER 100%, PR 100%, HER-2 negative, Ki-67 15%   PET/CT scan: Negative for metastatic disease Sentinel lymph node study: 1/4 sentinel nodes positive with extracapsular extension   Mammaprint low-risk: 10 year risk of recurrence 10%  Adjuvant radiation therapy 07/24/2016- 09/07/2016   Treatment plan: Adjuvant antiestrogen therapy with anastrozole 1 mg daily 7 years switched to letrozole December 2018   Letrozole toxicities: Very occasional hot flashes.  Denies any major trouble from this.     Breast cancer surveillance: 1.  Breast exam  Not done 2. mammogram 10/19/2022: Benign breast density category B 3. MRI abdomen 02/17/2021: Mild intrahepatic biliary duct dilatation, pancreatic pseudocyst   Her daughter passed away in 12-15-2017 and she is still grieving from that.      Depression Reports low energy and feelings of depression. Currently on Wellbutrin 150mg . -Increase Wellbutrin to 300mg  daily. Prescription sent to [facility] in [place].  Vitamin D Deficiency Currently taking 1000 international units of Vitamin D3 daily. -Increase Vitamin D3 to 2000 international units daily.  Smoking Cessation Successful cessation of smoking, currently using vaping as a transitional tool. -Encourage continued cessation and gradual reduction of vaping.  General Health Maintenance Encouraged to find activities that bring joy and encourage leaving the house, such as horseback riding. -Continue current lifestyle modifications and consider seeking opportunities for horseback riding.  Follow-up in 1 year.          No orders  of the defined types were placed in this encounter.  The patient has a good understanding of the overall plan. she agrees with it. she will call with any problems that may develop before the next visit here. Total time spent: 30 mins including face to face time and time spent for planning, charting and co-ordination of care   Tamsen Meek, MD 12/06/22

## 2022-12-06 NOTE — Assessment & Plan Note (Signed)
04/12/2016 Left breast biopsy/ lumpectomy: IDC grade 10.8 cm with DCIS grade 1, margins negative, ER 100%, PR 100%, HER-2 negative, Ki-67 15%   PET/CT scan: Negative for metastatic disease Sentinel lymph node study: 1/4 sentinel nodes positive with extracapsular extension   Mammaprint low-risk: 10 year risk of recurrence 10% Adjuvant radiation therapy 07/24/2016- 09/07/2016   Treatment plan: Adjuvant antiestrogen therapy with anastrozole 1 mg daily 7 years switched to letrozole December 2018   Letrozole toxicities: Very occasional hot flashes.  Denies any major trouble from this.     Breast cancer surveillance: 1.  Breast exam  Not done 2. mammogram 10/19/2022: Benign breast density category B 3. MRI abdomen 02/17/2021: Mild intrahepatic biliary duct dilatation, pancreatic pseudocyst   Her daughter passed away in 2017-12-28 and she is still grieving from that.   Return to clinic in 1 year for follow-up

## 2022-12-25 DIAGNOSIS — M25512 Pain in left shoulder: Secondary | ICD-10-CM | POA: Diagnosis not present

## 2022-12-25 DIAGNOSIS — G8929 Other chronic pain: Secondary | ICD-10-CM | POA: Diagnosis not present

## 2022-12-25 DIAGNOSIS — M25511 Pain in right shoulder: Secondary | ICD-10-CM | POA: Diagnosis not present

## 2022-12-25 DIAGNOSIS — G959 Disease of spinal cord, unspecified: Secondary | ICD-10-CM | POA: Diagnosis not present

## 2022-12-27 ENCOUNTER — Other Ambulatory Visit: Payer: Self-pay | Admitting: Family Medicine

## 2022-12-27 ENCOUNTER — Other Ambulatory Visit: Payer: Self-pay | Admitting: Neurology

## 2023-01-01 NOTE — Telephone Encounter (Signed)
Pt needs to Make and Keep Appointment for Refill Request

## 2023-01-02 ENCOUNTER — Telehealth: Payer: Self-pay | Admitting: *Deleted

## 2023-01-02 NOTE — Telephone Encounter (Signed)
Called and LVM for pt (ok per DPR). Relayed we got refill request for lamotrigine but its been over a yr since seen. We refilled once in August but no visit. She will need to be seen first before refilling. In meantime, she can ask for refill from PCP. Asked her to call our office back to schedule appt [phone].

## 2023-01-07 DIAGNOSIS — G5601 Carpal tunnel syndrome, right upper limb: Secondary | ICD-10-CM | POA: Diagnosis not present

## 2023-01-07 DIAGNOSIS — M19011 Primary osteoarthritis, right shoulder: Secondary | ICD-10-CM | POA: Diagnosis not present

## 2023-01-15 DIAGNOSIS — F1120 Opioid dependence, uncomplicated: Secondary | ICD-10-CM | POA: Diagnosis not present

## 2023-01-15 DIAGNOSIS — G894 Chronic pain syndrome: Secondary | ICD-10-CM | POA: Diagnosis not present

## 2023-01-15 MED ORDER — LAMOTRIGINE 200 MG PO TABS: 200.0000 mg | ORAL_TABLET | Freq: Every day | ORAL | 0 refills | Status: DC

## 2023-01-15 NOTE — Addendum Note (Signed)
Addended by: Danne Harbor on: 01/15/2023 09:51 AM   Modules accepted: Orders

## 2023-01-15 NOTE — Telephone Encounter (Signed)
Refilled as requested  

## 2023-01-15 NOTE — Telephone Encounter (Signed)
Patient was called to schedule an appointment from a new referral from her Ortho office. I was able to get her scheduled for an appointment on March 19, 2023. At the end of the call, she asked about a refill of her lamotrigine. I let her know it was originally declined since we had not seen her for over a year, but that I would send a message back to see if it could be refilled through her appointment with [place] in February.  Can you please advise if patients RX can be refilled through 03/19/2023 when she is scheduled to see our office?

## 2023-01-24 ENCOUNTER — Other Ambulatory Visit: Payer: Self-pay | Admitting: Neurology

## 2023-03-18 ENCOUNTER — Telehealth: Payer: Self-pay | Admitting: Neurology

## 2023-03-18 NOTE — Telephone Encounter (Signed)
 Pt called to reschedule appointment due to potential of bad weather.

## 2023-03-19 ENCOUNTER — Institutional Professional Consult (permissible substitution): Payer: PPO | Admitting: Neurology

## 2023-03-27 ENCOUNTER — Other Ambulatory Visit: Payer: Self-pay | Admitting: Family Medicine

## 2023-03-27 ENCOUNTER — Other Ambulatory Visit: Payer: Self-pay | Admitting: Neurology

## 2023-03-27 DIAGNOSIS — I10 Essential (primary) hypertension: Secondary | ICD-10-CM

## 2023-04-25 DIAGNOSIS — G894 Chronic pain syndrome: Secondary | ICD-10-CM | POA: Diagnosis not present

## 2023-04-25 DIAGNOSIS — F1120 Opioid dependence, uncomplicated: Secondary | ICD-10-CM | POA: Diagnosis not present

## 2023-05-27 ENCOUNTER — Other Ambulatory Visit: Payer: Self-pay | Admitting: Neurology

## 2023-05-30 ENCOUNTER — Ambulatory Visit (INDEPENDENT_AMBULATORY_CARE_PROVIDER_SITE_OTHER): Payer: PPO | Admitting: Neurology

## 2023-05-30 ENCOUNTER — Encounter: Payer: Self-pay | Admitting: Neurology

## 2023-05-30 VITALS — BP 139/69 | HR 74 | Ht 63.0 in | Wt 119.0 lb

## 2023-05-30 DIAGNOSIS — M5412 Radiculopathy, cervical region: Secondary | ICD-10-CM

## 2023-05-30 MED ORDER — DULOXETINE HCL 30 MG PO CPEP: 30.0000 mg | ORAL_CAPSULE | Freq: Every day | ORAL | 0 refills | Status: DC

## 2023-05-30 NOTE — Patient Instructions (Signed)
 Continue current medications, will give patient a short course of higher dose of duloxetine  90 mg daily Referral to physical therapy for cervicalgia and cervical radiculopathy EMG nerve conduction study of the right upper extremity Consider massage therapy, acupuncture Continue follow-up PCP Return as needed

## 2023-05-30 NOTE — Progress Notes (Signed)
 [facility]  PATIENT: Ashley Cross DOB: Mar 29, 1955  REQUESTING CLINICIAN: Saundra Curl, MD HISTORY FROM: Patient  REASON FOR VISIT: Right upper extremity pain, rule out Carpal Tunnel Syndrome   HISTORICAL  CHIEF COMPLAINT:  Chief Complaint  Patient presents with   New Patient (Initial Visit)    Pt in 12, here alone  Pt is referred for NCV/EMG for RUE. Pt states she has pain on right shoulder that radiates all the way down her hand.     HISTORY OF PRESENT ILLNESS:  This is a 68 year old woman past medical history of cervical Radiculopathy/myelopathy status post cervical surgeries (a total of 4, last one in 2024), chronic pain, hypertension, hyperlipidemia, depression who was referred here by orthopedics for EMG/nerve conduction study for carpal tunnel syndrome.  Patient tells me that she experiences constant right arm pain.  Pain usually starts at the level of the shoulder and travels down to the hands.  Feels like electricity, sometimes feel like burning sensation.  She report limited ROM movement of the right arm.  She cannot exercise/like she used to, cannot walk her dog and the pain is affecting her day-to-day activity.  She is on neuropathic pain meds including duloxetine  and pregabalin .     OTHER MEDICAL CONDITIONS: Chronic pain, Cervical radiculopathy/myelopathy s/p multiple surgeries, Hypertension    REVIEW OF SYSTEMS: Full 14 system review of systems performed and negative with exception of: As noted in the HPI   ALLERGIES: Allergies  Allergen Reactions   Anastrozole      Pt did not feel like herself    Augmentin  [Amoxicillin -Pot Clavulanate]     Diarrhea    Norvasc  [Amlodipine ]     Swelling    Topamax  [Topiramate ]     Didn't like the way made feel more irritatble/sad    Sulfa Antibiotics Itching and Rash   Sulfonamide Derivatives Itching and Rash    HOME MEDICATIONS: Outpatient Medications Prior to Visit  Medication Sig  Dispense Refill   albuterol  (VENTOLIN  HFA) 108 (90 Base) MCG/ACT inhaler INHALE 1 TO 2 PUFFS EVERY 4 HOURS AS NEEDED FOR WHEEZING OR SHORTNESS OF BREATH 18 g 2   baclofen  (LIORESAL ) 10 MG tablet Take 1 tablet (10 mg total) by mouth 3 (three) times daily as needed for muscle spasms. 30 each 1   buPROPion  (WELLBUTRIN  XL) 300 MG 24 hr tablet Take 1 tablet (300 mg total) by mouth daily. 90 tablet 3   cholecalciferol (VITAMIN D3) 25 MCG (1000 UNIT) tablet Take 1,000 Units by mouth daily.     DULoxetine  (CYMBALTA ) 60 MG capsule Take 1 capsule (60 mg total) by mouth daily. 90 capsule 3   furosemide  (LASIX ) 40 MG tablet TAKE ONE TABLET ONCE DAILY AS NEEDED FOREDEMA 90 tablet 0   ipratropium (ATROVENT ) 0.06 % nasal spray PLACE 2 SPRAYS INTO BOTH NOSTRILS 3 TIMES DAILY AS NEEDED (Patient taking differently: Place 2 sprays into both nostrils 3 (three) times daily as needed for rhinitis.) 15 mL 12   lamoTRIgine  (LAMICTAL ) 200 MG tablet TAKE ONE TABLET BY MOUTH ONCE DAILY 30 tablet 0   letrozole  (FEMARA ) 2.5 MG tablet TAKE ONE TABLET (2.5 MG) BY MOUTH EVERY DAY 90 tablet 3   montelukast  (SINGULAIR ) 10 MG tablet TAKE ONE TABLET AT BEDTIME 90 tablet 3   nebivolol  (BYSTOLIC ) 5 MG tablet Take 1 tablet (5 mg total) by mouth daily. 90 tablet 3   oxyCODONE -acetaminophen  (PERCOCET) 5-325 MG tablet Take 1 tablet by mouth every 4 (four) hours as needed for  severe pain. 20 tablet 0   pregabalin  (LYRICA ) 150 MG capsule Take 150 mg by mouth 3 (three) times daily.     pyridoxine (B-6) 100 MG tablet Take 100 mg by mouth daily.     telmisartan  (MICARDIS ) 80 MG tablet TAKE 1 TABLET BY MOUTH DAILY 90 tablet 0   No facility-administered medications prior to visit.    PAST MEDICAL HISTORY: Past Medical History:  Diagnosis Date   Alcohol use    Anemia    hx   Anxiety    Aortic regurgitation 08/29/2020   Breast cancer (HCC)    Left breast(nipple mass) 2018   Cervical stenosis of spine    Chicken pox    Chronic back  pain    Colon polyps    COPD (chronic obstructive pulmonary disease) (HCC)    Cyst of left nipple    Depression    Hypertension    Insomnia    hx   Personal history of radiation therapy     PAST SURGICAL HISTORY: Past Surgical History:  Procedure Laterality Date   ANTERIOR CERVICAL DECOMP/DISCECTOMY FUSION N/A 08/22/2022   Procedure: Anterior Cervical Decompression Fusion  - Cervical three-Cervical four;  Surgeon: Joaquin Mulberry, MD;  Location: [facility] OR;  Service: Neurosurgery;  Laterality: N/A;   BACK SURGERY     x 3 (2010 x 2; 2013 x 1)  Dr. Rochelle Chu   BREAST BIOPSY Left    core- neg   BREAST LUMPECTOMY Left 2018   BREAST SURGERY Left    breast biopsy   CERVICAL CONE BIOPSY     CERVICAL FUSION     times 2   COLONOSCOPY WITH PROPOFOL  N/A 04/18/2015   Procedure: COLONOSCOPY WITH PROPOFOL ;  Surgeon: Luella Sager, MD;  Location: [facility] ENDOSCOPY;  Service: Endoscopy;  Laterality: N/A;   ELBOW SURGERY     2014 b/l elbows per pt ulnar nerve    ELBOW SURGERY     x2    EPIDURAL BLOCK INJECTION     Dr. Victory Gravel  (multiple)   MASS EXCISION Left 04/12/2016   Procedure: EXCISION OF LEFT NIPPLE MASS;  Surgeon: Lockie Rima, MD;  Location:  SURGERY CENTER;  Service: General;  Laterality: Left;   NECK SURGERY     POSTERIOR CERVICAL FUSION/FORAMINOTOMY  03/07/2011   Procedure: POSTERIOR CERVICAL FUSION/FORAMINOTOMY LEVEL 4;  Surgeon: Isadora Mar, MD;  Location: [facility]UR[facility] ORS;  Service: Neurosurgery;  Laterality: N/A;  cervical three to thoracic one posterior cervical fusion    SENTINEL NODE BIOPSY Left 05/29/2016   Procedure: LEFT SENTINEL LYMPH NODE BIOPSY;  Surgeon: Lockie Rima, MD;  Location: [facility] OR;  Service: General;  Laterality: Left;   TONSILLECTOMY      FAMILY HISTORY: Family History  Problem Relation Age of Onset   Breast cancer Mother 12   Hypertension Mother    Cancer Mother        breast dx'ed 8    Cancer Father        prostate   Hypertension Father     Drug abuse Daughter    Hypertension Maternal Grandmother    Breast cancer Cousin     SOCIAL HISTORY: Social History   Socioeconomic History   Marital status: Divorced    Spouse name: Not on file   Number of children: 1   Years of education: Not on file   Highest education level: Not on file  Occupational History   Occupation: Disability  Tobacco Use   Smoking status: Every Day  Current packs/day: 0.50    Average packs/day: 0.5 packs/day for 30.0 years (15.0 ttl pk-yrs)    Types: Cigarettes   Smokeless tobacco: Never   Tobacco comments:    Pt states she stopped smoking cigarettes in aug/2024.   Vaping Use   Vaping status: Some Days   Substances: Nicotine , Flavoring  Substance and Sexual Activity   Alcohol use: Yes    Alcohol/week: 7.0 standard drinks of alcohol    Types: 7 Standard drinks or equivalent per week    Comment: wine/beer   Drug use: Yes    Types: Marijuana    Comment: occasional   Sexual activity: Not Currently    Birth control/protection: Post-menopausal  Other Topics Concern   Not on file  Social History Narrative   As of 01/25/17 not sexually active of in relationship    Divorced   2 kids son and daughter (though daughter died of suicide in 07/27/2017)   Son lives in [place]  now as of 12/2018       Disability    Loves animals has 3 dogs           Social Drivers of Corporate investment banker Strain: High Risk (06/27/2022)   Overall Financial Resource Strain (CARDIA)    Difficulty of Paying Living Expenses: Hard  Food Insecurity: No Food Insecurity (06/27/2022)   Hunger Vital Sign    Worried About Running Out of Food in the Last Year: Never true    Ran Out of Food in the Last Year: Never true  Transportation Needs: No Transportation Needs (06/27/2022)   PRAPARE - Administrator, Civil Service (Medical): No    Lack of Transportation (Non-Medical): No  Physical Activity: Inactive (06/27/2022)   Exercise Vital Sign    Days of Exercise  per Week: 0 days    Minutes of Exercise per Session: 0 min  Stress: Stress Concern Present (06/27/2022)   [employer] of Occupational Health - Occupational Stress Questionnaire    Feeling of Stress : Rather much  Social Connections: Socially Isolated (03/29/2021)   Social Connection and Isolation Panel [NHANES]    Frequency of Communication with Friends and Family: More than three times a week    Frequency of Social Gatherings with Friends and Family: Three times a week    Attends Religious Services: Never    Active Member of Clubs or Organizations: No    Attends Banker Meetings: Never    Marital Status: Divorced  Catering manager Violence: Not At Risk (06/27/2022)   Humiliation, Afraid, Rape, and Kick questionnaire    Fear of Current or Ex-Partner: No    Emotionally Abused: No    Physically Abused: No    Sexually Abused: No    PHYSICAL EXAM  GENERAL EXAM/CONSTITUTIONAL: Vitals:  Vitals:   05/30/23 1254  BP: 139/69  Pulse: 74  Weight: 119 lb (54 kg)  Height: 5\' 3"  (1.6 m)   Body mass index is 21.08 kg/m. Wt Readings from Last 3 Encounters:  05/30/23 119 lb (54 kg)  12/06/22 119 lb 12.8 oz (54.3 kg)  08/22/22 119 lb (54 kg)   Patient appears uncomfortable; well developed, nourished and groomed; limited ROM of neck with pain  MUSCULOSKELETAL: Gait, strength, tone, movements noted in Neurologic exam below  NEUROLOGIC: MENTAL STATUS:      No data to display         awake, alert, oriented to person, place and time recent and remote memory intact normal attention and  concentration language fluent, comprehension intact, naming intact fund of knowledge appropriate  CRANIAL NERVE:  2nd, 3rd, 4th, 6th - Visual fields full to confrontation, extraocular muscles intact, no nystagmus 5th - facial sensation symmetric 7th - facial strength symmetric 8th - hearing intact 9th - palate elevates symmetrically, uvula midline 11th - shoulder shrug  symmetric 12th - tongue protrusion midline  MOTOR:  normal bulk and tone, BUE is limited by pain. Unable to lift both arms pass 90 degrees at the shoulder. Normal elbow flexion/extension 5/5  SENSORY:  normal and symmetric to light touch  COORDINATION:  finger-nose-finger, fine finger movements normal  REFLEXES:  deep tendon reflexes present and symmetric  GAIT/STATION:  normal    DIAGNOSTIC DATA (LABS, IMAGING, TESTING) - I reviewed patient records, labs, notes, testing and imaging myself where available.  Lab Results  Component Value Date   WBC 6.0 08/13/2022   HGB 14.3 08/13/2022   HCT 43.4 08/13/2022   MCV 97.5 08/13/2022   PLT 205 08/13/2022      Component Value Date/Time   NA 131 (L) 08/13/2022 1430   K 3.8 08/13/2022 1430   CL 96 (L) 08/13/2022 1430   CO2 25 08/13/2022 1430   GLUCOSE 94 08/13/2022 1430   BUN <5 (L) 08/13/2022 1430   CREATININE 0.61 08/13/2022 1430   CREATININE 0.73 08/05/2020 1656   CALCIUM 9.2 08/13/2022 1430   PROT 6.8 06/19/2022 1213   ALBUMIN 4.1 06/19/2022 1213   AST 28 06/19/2022 1213   AST 25 02/13/2017 0936   ALT 25 06/19/2022 1213   ALT 22 02/13/2017 0936   ALKPHOS 121 (H) 06/19/2022 1213   BILITOT 0.5 06/19/2022 1213   BILITOT 0.7 02/13/2017 0936   GFRNONAA >60 08/13/2022 1430   GFRNONAA >60 02/13/2017 0936   GFRAA >60 02/13/2017 0936   Lab Results  Component Value Date   CHOL 212 (H) 04/24/2022   HDL 104.20 04/24/2022   LDLCALC 76 04/28/2020   LDLDIRECT 46.0 04/24/2022   TRIG 215.0 (H) 04/24/2022   CHOLHDL 2 04/24/2022   Lab Results  Component Value Date   HGBA1C 5.4 04/24/2022   Lab Results  Component Value Date   VITAMINB12 448 04/24/2022   Lab Results  Component Value Date   TSH 1.54 04/24/2022     ASSESSMENT AND PLAN  68 y.o. year old female with history of cervical radiculopathy, myelopathy status post multiple cervical surgeries, last one in July 2024, chronic pain, depression who is presenting  with right upper extremity pain, burning sensation wanting to rule out CTS.  Pain is worse when turning her head to the right.  Her reflexes are normal.  She does have tenderness to palpation on the cervical paraspinal muscles.  I do believe patient does have cervical radiculopathy rather than carpal tunnel syndrome.  We will obtain an EMG nerve conduction study for confirmation but I will refer patient to physical therapy.  I will give her a trial of high-dose duloxetine  90 mg daily to control her pain.  I will send her to physical therapy and we also discussed nondrug therapy including massage therapy, acupuncture, TENS unit.  She will try to arrange those treatments.  Will forward the results of the EMG to the requesting physician.  Continue to follow PCP return as needed.   1. Cervical radiculopathy      Patient Instructions  Continue current medications, will give patient a short course of higher dose of duloxetine  90 mg daily Referral to physical therapy for cervicalgia and  cervical radiculopathy EMG nerve conduction study of the right upper extremity Consider massage therapy, acupuncture Continue follow-up PCP Return as needed   Orders Placed This Encounter  Procedures   Ambulatory referral to Physical Therapy   NCV with EMG(electromyography)    Meds ordered this encounter  Medications   DULoxetine  (CYMBALTA ) 30 MG capsule    Sig: Take 1 capsule (30 mg total) by mouth daily.    Dispense:  30 capsule    Refill:  0    To take with the 60 mg for a total of 90 mg daily    Return if symptoms worsen or fail to improve.    Cassandra Cleveland, MD 05/30/2023, 1:49 PM  [facility] [address], [address] [place], [place] [address] [phone]

## 2023-06-03 DIAGNOSIS — G5601 Carpal tunnel syndrome, right upper limb: Secondary | ICD-10-CM | POA: Diagnosis not present

## 2023-06-05 ENCOUNTER — Telehealth: Payer: Self-pay | Admitting: Family Medicine

## 2023-06-05 NOTE — Telephone Encounter (Signed)
 Dr Sueanne Emerald is leaving the practice in June. If you wish to continue care at this office please call and schedule a transfer of care appointment with one of the following providers: Dr Casimir Cleaver, MD, Tino Foreman or Tona Francis, NP.  E2C2 please schedule TOC

## 2023-06-24 ENCOUNTER — Other Ambulatory Visit: Payer: Self-pay | Admitting: Neurology

## 2023-06-25 ENCOUNTER — Other Ambulatory Visit: Payer: Self-pay | Admitting: Neurology

## 2023-06-27 ENCOUNTER — Other Ambulatory Visit: Payer: Self-pay | Admitting: Neurology

## 2023-06-28 ENCOUNTER — Encounter: Admitting: Family Medicine

## 2023-07-03 ENCOUNTER — Ambulatory Visit (INDEPENDENT_AMBULATORY_CARE_PROVIDER_SITE_OTHER): Admitting: Neurology

## 2023-07-03 ENCOUNTER — Ambulatory Visit

## 2023-07-03 ENCOUNTER — Telehealth: Payer: Self-pay | Admitting: Neurology

## 2023-07-03 ENCOUNTER — Encounter: Payer: Self-pay | Admitting: Neurology

## 2023-07-03 DIAGNOSIS — M5412 Radiculopathy, cervical region: Secondary | ICD-10-CM

## 2023-07-03 MED ORDER — LAMOTRIGINE 200 MG PO TABS: 200.0000 mg | ORAL_TABLET | Freq: Every day | ORAL | 1 refills | Status: AC

## 2023-07-03 NOTE — Procedures (Signed)
 Full Name: Ashley Cross Gender: Female MRN #: [id number] Date of Birth: 1955/07/08    Visit Date: 07/03/2023 12:05 Age: 68 Years Examining Physician: Phebe Brasil Referring Physician: Samara Crest,  Height: 5 feet 4 inch History: 68 year old female with history of multiple cervical decompression surgery, complains of persistent neck pain radiating pain to bilateral shoulder, upper extremity  Summary of the test:  Nerve conduction study:  Bilateral ulnar sensory and motor responses were normal.  Right median sensory response was within normal limit, but right median mixed response was 0.5 ms prolonged compared to ipsilateral ulnar mixed response.  Right median motor response was normal.  Left median sensory, mixed, and motor responses were normal  Electromyography:  Selected needle examination of bilateral upper extremity muscles, and cervical paraspinal muscle performed.  There is mild chronic neuropathic changes, involving right C5-6 myotomes, and left C7-8 myotomes.  There was no evidence of spontaneous activity at cervical paraspinal muscles   Conclusion: This is a mild abnormal study.  There is electrodiagnostic evidence of mild distal median neuropathy across the wrist, consistent with mild right carpal tunnel syndromes.  There is also evidence of chronic right C5, left C7-8 cervical radiculopathy.  There is no evidence of active process.    ------------------------------- Physician Name, M.D.  [facility] [address], [address] [place], [place] [address] Tel: [phone] Fax: [phone]  Verbal informed consent was obtained from the patient, patient was informed of potential risk of procedure, including bruising, bleeding, hematoma formation, infection, muscle weakness, muscle pain, numbness, among others.        MNC    Nerve / Sites Muscle Latency Ref. Amplitude Ref. Rel Amp Segments Distance Velocity Ref. Area    ms ms mV mV %  cm m/s  m/s mVms  R Median - APB     Wrist APB 3.6 <=4.4 5.5 >=4.0 100 Wrist - APB 7   16.0     Upper arm APB 7.2  5.3  96 Upper arm - Wrist 22 61 >=49 16.3  L Median - APB     Wrist APB 3.4 <=4.4 4.5 >=4.0 100 Wrist - APB 7   13.0     Upper arm APB 7.0  4.1  90.3 Upper arm - Wrist 22 61 >=49 11.7  R Ulnar - ADM     Wrist ADM 2.2 <=3.3 11.7 >=6.0 100 Wrist - ADM 7   31.7     B.Elbow ADM 4.1  12.3  106 B.Elbow - Wrist 13 66 >=49 31.7     A.Elbow ADM 6.8  11.2  91.1 A.Elbow - B.Elbow 16 59 >=49 30.1  L Ulnar - ADM     Wrist ADM 2.3 <=3.3 7.4 >=6.0 100 Wrist - ADM 7   18.0     B.Elbow ADM 4.3  6.1  82.4 B.Elbow - Wrist 12 60 >=49 16.8     A.Elbow ADM 6.9  5.8  94.9 A.Elbow - B.Elbow 14 53 >=49 17.0             SNC    Nerve / Sites Rec. Site Peak Lat Ref.  Amp Ref. Segments Distance Peak Diff Ref.    ms ms V V  cm ms ms  R Median, Ulnar - Transcarpal comparison     Median Palm Wrist 2.3 <=2.2 22 >=35 Median Palm - Wrist 8       Ulnar Palm Wrist 1.8 <=2.2 29 >=12 Ulnar Palm - Wrist 8  Median Palm - Ulnar Palm  0.5 <=0.4  L Median, Ulnar - Transcarpal comparison     Median Palm Wrist 2.3 <=2.2 33 >=35 Median Palm - Wrist 8       Ulnar Palm Wrist 2.1 <=2.2 19 >=12 Ulnar Palm - Wrist 8          Median Palm - Ulnar Palm  0.2 <=0.4  R Median - Orthodromic (Dig II, Mid palm)     Dig II Wrist 3.2 <=3.4 10 >=10 Dig II - Wrist 13    L Median - Orthodromic (Dig II, Mid palm)     Dig II Wrist 3.2 <=3.4 8 >=10 Dig II - Wrist 13    R Ulnar - Orthodromic, (Dig V, Mid palm)     Dig V Wrist 2.5 <=3.1 11 >=5 Dig V - Wrist 11    L Ulnar - Orthodromic, (Dig V, Mid palm)     Dig V Wrist 2.7 <=3.1 8 >=5 Dig V - Wrist 18                   F  Wave    Nerve F Lat Ref.   ms ms  R Ulnar - ADM 26.3 <=32.0  L Ulnar - ADM 28.4 <=32.0         EMG Summary Table    Spontaneous MUAP Recruitment  Muscle IA Fib PSW Fasc Other Amp Dur. Poly Pattern  R. First dorsal interosseous Normal None None None _______  Normal Normal Normal Normal  R. Pronator teres Normal None None None _______ Normal Normal Normal Normal  R. Triceps brachii Normal None None None _______ Normal Normal Normal Normal  R. Deltoid Normal None None None _______ Normal Normal Normal Reduced  R. Biceps brachii Normal None None None _______ Normal Normal Normal Reduced  R. Extensor digitorum communis Normal None None None _______ Normal Normal Normal Normal  L. First dorsal interosseous Normal None None None _______ Normal Normal Normal Reduced  L. Pronator teres Normal None None None _______ Normal Normal Normal Normal  L. Triceps brachii Normal None None None _______ Normal Normal Normal Normal  L. Thoracic paraspinals Normal None None None _______ Normal Normal Normal Normal  L. Deltoid Normal None None None _______ Normal Normal Normal Normal  L. Biceps brachii Normal None None None _______ Normal Normal Normal Normal  L. Cervical paraspinals Normal None None None _______ Normal Normal Normal Normal  L. Extensor digitorum communis Normal None None None _______ Normal Normal Normal Reduced

## 2023-07-03 NOTE — Telephone Encounter (Signed)
 refilled

## 2023-07-03 NOTE — Telephone Encounter (Signed)
 Pt in office requesting refill of  lamoTRIgine  (LAMICTAL ) 200 MG tablet at [facility]

## 2023-07-03 NOTE — Progress Notes (Signed)
 Chief Complaint  Patient presents with   nerve conduction study     EMG RM 3.    ASSESSMENT AND PLAN  Ashley Cross is a 68 y.o. female   History of multiple cervical decompression surgery, Persistent neck pain, radiating pain to bilateral shoulder upper extremity and hands  EMG nerve conduction study today only showed mild right carpal tunnel syndromes, evidence of chronic cervical radiculopathy, no active process  She will continue conservative treatment with pain management, also suggested evaluation of her right shoulder arthritis if needed, may benefit as needed NSAIDs, warm compression  DIAGNOSTIC DATA (LABS, IMAGING, TESTING) - I reviewed patient records, labs, notes, testing and imaging myself where available.  MRI cervical in August 2022: Degenerative and postoperative changes as detailed above. Cervical levels are similar to the prior study. There is increased canal and foraminal stenosis at T1-T2.   Abnormal cervical cord signal consistent with myelomalacia from C2-3 to C6-7.  MEDICAL HISTORY:  Ashley Cross is a 68 year old female, seen in request by Dr. Camara, Amadou for electrodiagnostic study to evaluate her chronic neck pain, bilateral upper extremity pain  History is obtained from the patient and review of electronic medical records. I personally reviewed pertinent available imaging films in PACS.   PMHx of  HTN Anxiety Left breast cancer in 2018, s/p lobectomy, radiation therapy, Cervical decompression surgery   She had a history of multiple cervical decompression surgery in the past, most recent one was on August 22, 2022 by Dr. Roberts Ching, ACDF C3-4, for complaints of chronic neck pain, radiating pain to both arms,  She continue complains of constant chronic neck pain, bilateral shoulder pain, bilateral upper extremity achy pain, sometimes radiating nerve pain, involving both hands  She is already on polypharmacy for depression, anxiety,  including chronic narcotic treatment,  Denies lower extremity paresthesia or weakness PHYSICAL EXAM:   Vitals:   07/03/23 1124  BP: (!) 152/78  Pulse: 88  Weight: 119 lb (54 kg)  Height: 5\' 4"  (1.626 m)   Body mass index is 20.43 kg/m.  PHYSICAL EXAMNIATION:  Gen: NAD, conversant, well nourised, well groomed                     Cardiovascular: Regular rate rhythm, no peripheral edema, warm, nontender. Eyes: Conjunctivae clear without exudates or hemorrhage Neck: Supple, no carotid bruits. Pulmonary: Clear to auscultation bilaterally   NEUROLOGICAL EXAM:  MENTAL STATUS: Speech/cognition: Awake, alert, oriented to history taking and casual conversation CRANIAL NERVES: CN II: Visual fields are full to confrontation. Pupils are round equal and briskly reactive to light. CN III, IV, VI: extraocular movement are normal. No ptosis. CN V: Facial sensation is intact to light touch CN VII: Face is symmetric with normal eye closure  CN VIII: Hearing is normal to causal conversation. CN IX, X: Phonation is normal. CN XI: Head turning and shoulder shrug are intact  MOTOR: Limited range of motion of bilateral shoulder, tenderness of bilateral shoulder with deep palpitation, felt there was no significant upper or lower extremity proximal or distal muscle weakness  REFLEXES: Reflexes are 2+ and symmetric at the biceps, triceps, knees, and ankles. Plantar responses are flexor.  SENSORY: Intact to light touch, pinprick and vibratory sensation are intact in fingers and toes.  COORDINATION: There is no trunk or limb dysmetria noted.  GAIT/STANCE: Posture is normal. Gait is steady   REVIEW OF SYSTEMS:  Full 14 system review of systems performed and notable only for as  above All other review of systems were negative.   ALLERGIES: Allergies  Allergen Reactions   Anastrozole      Pt did not feel like herself    Augmentin  [Amoxicillin -Pot Clavulanate]     Diarrhea    Norvasc   [Amlodipine ]     Swelling    Topamax  [Topiramate ]     Didn't like the way made feel more irritatble/sad    Sulfa Antibiotics Itching and Rash   Sulfonamide Derivatives Itching and Rash    HOME MEDICATIONS: Current Outpatient Medications  Medication Sig Dispense Refill   albuterol  (VENTOLIN  HFA) 108 (90 Base) MCG/ACT inhaler INHALE 1 TO 2 PUFFS EVERY 4 HOURS AS NEEDED FOR WHEEZING OR SHORTNESS OF BREATH 18 g 2   baclofen  (LIORESAL ) 10 MG tablet Take 1 tablet (10 mg total) by mouth 3 (three) times daily as needed for muscle spasms. 30 each 1   buPROPion  (WELLBUTRIN  XL) 300 MG 24 hr tablet Take 1 tablet (300 mg total) by mouth daily. 90 tablet 3   DULoxetine  (CYMBALTA ) 30 MG capsule TAKE 1 CAPSULE BY MOUTH ONCE DAILY 30 capsule 0   DULoxetine  (CYMBALTA ) 60 MG capsule Take 1 capsule (60 mg total) by mouth daily. 90 capsule 3   furosemide  (LASIX ) 40 MG tablet TAKE ONE TABLET ONCE DAILY AS NEEDED FOREDEMA 90 tablet 0   lamoTRIgine  (LAMICTAL ) 200 MG tablet Take 1 tablet (200 mg total) by mouth daily. 90 tablet 1   letrozole  (FEMARA ) 2.5 MG tablet TAKE ONE TABLET (2.5 MG) BY MOUTH EVERY DAY 90 tablet 3   nebivolol  (BYSTOLIC ) 5 MG tablet Take 1 tablet (5 mg total) by mouth daily. 90 tablet 3   oxyCODONE -acetaminophen  (PERCOCET) 5-325 MG tablet Take 1 tablet by mouth every 4 (four) hours as needed for severe pain. 20 tablet 0   pregabalin  (LYRICA ) 150 MG capsule Take 150 mg by mouth 3 (three) times daily.     telmisartan  (MICARDIS ) 80 MG tablet TAKE 1 TABLET BY MOUTH DAILY 90 tablet 0   cholecalciferol (VITAMIN D3) 25 MCG (1000 UNIT) tablet Take 1,000 Units by mouth daily. (Patient not taking: Reported on 07/03/2023)     ipratropium (ATROVENT ) 0.06 % nasal spray PLACE 2 SPRAYS INTO BOTH NOSTRILS 3 TIMES DAILY AS NEEDED (Patient not taking: Reported on 07/03/2023) 15 mL 12   montelukast  (SINGULAIR ) 10 MG tablet TAKE ONE TABLET AT BEDTIME (Patient not taking: Reported on 07/03/2023) 90 tablet 3    pyridoxine (B-6) 100 MG tablet Take 100 mg by mouth daily. (Patient not taking: Reported on 07/03/2023)     No current facility-administered medications for this visit.    PAST MEDICAL HISTORY: Past Medical History:  Diagnosis Date   Alcohol use    Anemia    hx   Anxiety    Aortic regurgitation 08/29/2020   Breast cancer (HCC)    Left breast(nipple mass) 2018   Cervical stenosis of spine    Chicken pox    Chronic back pain    Colon polyps    COPD (chronic obstructive pulmonary disease) (HCC)    Cyst of left nipple    Depression    Hypertension    Insomnia    hx   Personal history of radiation therapy     PAST SURGICAL HISTORY: Past Surgical History:  Procedure Laterality Date   ANTERIOR CERVICAL DECOMP/DISCECTOMY FUSION N/A 08/22/2022   Procedure: Anterior Cervical Decompression Fusion  - Cervical three-Cervical four;  Surgeon: Joaquin Mulberry, MD;  [facility] OR;  Service: Neurosurgery;  Laterality: N/A;   BACK SURGERY     x 3 (2010 x 2; 01-Aug-2011 x 1)  Dr. Rochelle Chu   BREAST BIOPSY Left    core- neg   BREAST LUMPECTOMY Left 31-Jul-2016   BREAST SURGERY Left    breast biopsy   CERVICAL CONE BIOPSY     CERVICAL FUSION     times 2   COLONOSCOPY WITH PROPOFOL  N/A 04/18/2015   Procedure: COLONOSCOPY WITH PROPOFOL ;  Surgeon: Luella Sager, MD;  Location: [facility] ENDOSCOPY;  Service: Endoscopy;  Laterality: N/A;   ELBOW SURGERY     2014 b/l elbows per pt ulnar nerve    ELBOW SURGERY     x2    EPIDURAL BLOCK INJECTION     Dr. Victory Gravel  (multiple)   MASS EXCISION Left 04/12/2016   Procedure: EXCISION OF LEFT NIPPLE MASS;  Surgeon: Lockie Rima, MD;  Location: [facility];  Service: General;  Laterality: Left;   NECK SURGERY     POSTERIOR CERVICAL FUSION/FORAMINOTOMY  03/07/2011   Procedure: POSTERIOR CERVICAL FUSION/FORAMINOTOMY LEVEL 4;  Surgeon: Isadora Mar, MD;  Location: [facility] NEURO ORS;  Service: Neurosurgery;  Laterality: N/A;  cervical three to thoracic one  posterior cervical fusion    SENTINEL NODE BIOPSY Left 05/29/2016   Procedure: LEFT SENTINEL LYMPH NODE BIOPSY;  Surgeon: Lockie Rima, MD;  Location: [facility] OR;  Service: General;  Laterality: Left;   TONSILLECTOMY      FAMILY HISTORY: Family History  Problem Relation Age of Onset   Breast cancer Mother 48   Hypertension Mother    Cancer Mother        breast dx'ed 42    Cancer Father        prostate   Hypertension Father    Drug abuse Daughter    Hypertension Maternal Grandmother    Breast cancer Cousin     SOCIAL HISTORY: Social History   Socioeconomic History   Marital status: Divorced    Spouse name: Not on file   Number of children: 1   Years of education: Not on file   Highest education level: Not on file  Occupational History   Occupation: Disability  Tobacco Use   Smoking status: Every Day    Current packs/day: 0.50    Average packs/day: 0.5 packs/day for 30.0 years (15.0 ttl pk-yrs)    Types: Cigarettes   Smokeless tobacco: Never   Tobacco comments:    Pt states she stopped smoking cigarettes in aug/2024.   Vaping Use   Vaping status: Some Days   Substances: Nicotine , Flavoring  Substance and Sexual Activity   Alcohol use: Yes    Alcohol/week: 7.0 standard drinks of alcohol    Types: 7 Standard drinks or equivalent per week    Comment: wine/beer   Drug use: Yes    Types: Marijuana    Comment: occasional   Sexual activity: Not Currently    Birth control/protection: Post-menopausal  Other Topics Concern   Not on file  Social History Narrative   As of 01/25/17 not sexually active of in relationship    Divorced   2 kids son and daughter (though daughter died of suicide in 2017/07/31)   Son lives in [place]  now as of 12/2018       Disability    Loves animals has 3 dogs           Social Drivers of Corporate investment banker Strain: High Risk (06/27/2022)   Overall Physicist, medical Strain (  CARDIA)    Difficulty of Paying Living Expenses: Hard  Food  Insecurity: No Food Insecurity (06/27/2022)   Hunger Vital Sign    Worried About Running Out of Food in the Last Year: Never true    Ran Out of Food in the Last Year: Never true  Transportation Needs: No Transportation Needs (06/27/2022)   PRAPARE - Administrator, Civil Service (Medical): No    Lack of Transportation (Non-Medical): No  Physical Activity: Inactive (06/27/2022)   Exercise Vital Sign    Days of Exercise per Week: 0 days    Minutes of Exercise per Session: 0 min  Stress: Stress Concern Present (06/27/2022)   [employer] of Occupational Health - Occupational Stress Questionnaire    Feeling of Stress : Rather much  Social Connections: Socially Isolated (03/29/2021)   Social Connection and Isolation Panel [NHANES]    Frequency of Communication with Friends and Family: More than three times a week    Frequency of Social Gatherings with Friends and Family: Three times a week    Attends Religious Services: Never    Active Member of Clubs or Organizations: No    Attends Banker Meetings: Never    Marital Status: Divorced  Catering manager Violence: Not At Risk (06/27/2022)   Humiliation, Afraid, Rape, and Kick questionnaire    Fear of Current or Ex-Partner: No    Emotionally Abused: No    Physically Abused: No    Sexually Abused: No      Phebe Brasil, M.D. Ph.D.  [facility] [address] [place], [place] [address] Ph: [phone] Fax: [phone]  CC:  Cassandra Cleveland, MD [address] [place],  [place] [address]  Valli Gaw, MD

## 2023-07-15 DIAGNOSIS — M5412 Radiculopathy, cervical region: Secondary | ICD-10-CM | POA: Diagnosis not present

## 2023-07-15 DIAGNOSIS — M19011 Primary osteoarthritis, right shoulder: Secondary | ICD-10-CM | POA: Diagnosis not present

## 2023-07-22 DIAGNOSIS — M19011 Primary osteoarthritis, right shoulder: Secondary | ICD-10-CM | POA: Diagnosis not present

## 2023-07-22 DIAGNOSIS — G894 Chronic pain syndrome: Secondary | ICD-10-CM | POA: Diagnosis not present

## 2023-07-22 DIAGNOSIS — F1120 Opioid dependence, uncomplicated: Secondary | ICD-10-CM | POA: Diagnosis not present

## 2023-07-25 ENCOUNTER — Other Ambulatory Visit: Payer: Self-pay | Admitting: Family Medicine

## 2023-07-25 ENCOUNTER — Other Ambulatory Visit: Payer: Self-pay | Admitting: Neurology

## 2023-07-25 DIAGNOSIS — I10 Essential (primary) hypertension: Secondary | ICD-10-CM

## 2023-08-01 ENCOUNTER — Other Ambulatory Visit: Payer: Self-pay | Admitting: Family Medicine

## 2023-08-01 DIAGNOSIS — M542 Cervicalgia: Secondary | ICD-10-CM | POA: Diagnosis not present

## 2023-08-01 DIAGNOSIS — M19012 Primary osteoarthritis, left shoulder: Secondary | ICD-10-CM | POA: Diagnosis not present

## 2023-08-01 DIAGNOSIS — M19011 Primary osteoarthritis, right shoulder: Secondary | ICD-10-CM | POA: Diagnosis not present

## 2023-08-19 ENCOUNTER — Other Ambulatory Visit: Payer: Self-pay | Admitting: Neurology

## 2023-08-28 ENCOUNTER — Telehealth: Payer: Self-pay

## 2023-08-28 ENCOUNTER — Other Ambulatory Visit: Payer: Self-pay

## 2023-08-28 DIAGNOSIS — J309 Allergic rhinitis, unspecified: Secondary | ICD-10-CM

## 2023-08-28 DIAGNOSIS — R0982 Postnasal drip: Secondary | ICD-10-CM

## 2023-08-28 MED ORDER — IPRATROPIUM BROMIDE 0.06 % NA SOLN: NASAL | 12 refills | Status: AC

## 2023-08-28 MED ORDER — IPRATROPIUM BROMIDE 0.06 % NA SOLN
NASAL | 12 refills | Status: DC
Start: 2023-08-28 — End: 2023-08-28

## 2023-08-28 NOTE — Telephone Encounter (Signed)
 Noted will refill once pt has  TOC appointment scheduled.

## 2023-08-28 NOTE — Telephone Encounter (Signed)
 I left voicemail for patient letting her know that we received a medication refill request for her via fax and I noticed in her chart that she was a patient of Dr. Glenys Ferrari, who is no longer here.  I asked patient to please call us  to schedule a transfer of care appointment with one of our providers who are accepting new patients, and then we'll go ahead and take care of her medication refill.  I did send medication refill request to clinical.  E2C2 - when patient calls back, please assist her with scheduling her transfer of care appointment.

## 2023-09-10 ENCOUNTER — Telehealth: Payer: Self-pay | Admitting: Physical Therapy

## 2023-09-10 NOTE — Telephone Encounter (Signed)
 Called pt inquire about whether she wanted to move up in schedule because of increased availability in schedule. Pt reports that she needs to delay her eval for a couple of weeks due to personal reason. PT said next two weeks worth of apt will be cancelled and she is to come in on August 27th at 9:45 am for her first appointment.

## 2023-09-11 ENCOUNTER — Ambulatory Visit: Admitting: Physical Therapy

## 2023-09-17 ENCOUNTER — Other Ambulatory Visit: Payer: Self-pay | Admitting: General Surgery

## 2023-09-17 DIAGNOSIS — Z1231 Encounter for screening mammogram for malignant neoplasm of breast: Secondary | ICD-10-CM

## 2023-09-18 ENCOUNTER — Ambulatory Visit: Admitting: Physical Therapy

## 2023-09-24 ENCOUNTER — Encounter: Admitting: Physical Therapy

## 2023-09-24 ENCOUNTER — Other Ambulatory Visit: Payer: Self-pay | Admitting: Neurology

## 2023-09-25 ENCOUNTER — Ambulatory Visit

## 2023-10-02 ENCOUNTER — Ambulatory Visit: Attending: Neurology | Admitting: Physical Therapy

## 2023-10-02 DIAGNOSIS — M5412 Radiculopathy, cervical region: Secondary | ICD-10-CM | POA: Diagnosis not present

## 2023-10-02 DIAGNOSIS — M542 Cervicalgia: Secondary | ICD-10-CM | POA: Insufficient documentation

## 2023-10-02 NOTE — Therapy (Signed)
 OUTPATIENT PHYSICAL THERAPY CERVICAL EVALUATION   Patient Name: Ashley Cross MRN: [id number] DOB:[birth year], 68 y.o., female Today's Date: 10/02/2023  END OF SESSION:  PT End of Session - 10/02/23 1646     Visit Number 1    Number of Visits 24    Date for PT Re-Evaluation 12/25/23    Authorization Type [id number]    Authorization - Visit Number 1    Authorization - Number of Visits 24    Progress Note Due on Visit 10    PT Start Time 1645    PT Stop Time 1730    PT Time Calculation (min) 45 min    Activity Tolerance Patient limited by pain    Behavior During Therapy [facility] for tasks assessed/performed          Past Medical History:  Diagnosis Date   Alcohol use    Anemia    hx   Anxiety    Aortic regurgitation 08/29/2020   Breast cancer (HCC)    Left breast(nipple mass) 2018   Cervical stenosis of spine    Chicken pox    Chronic back pain    Colon polyps    COPD (chronic obstructive pulmonary disease) (HCC)    Cyst of left nipple    Depression    Hypertension    Insomnia    hx   Personal history of radiation therapy    Past Surgical History:  Procedure Laterality Date   ANTERIOR CERVICAL DECOMP/DISCECTOMY FUSION N/A 08/22/2022   Procedure: Anterior Cervical Decompression Fusion  - Cervical three-Cervical four;  Surgeon: Joshua Alm Hamilton, MD;  Location: [facility] OR;  Service: Neurosurgery;  Laterality: N/A;   BACK SURGERY     x 3 (2010 x 2; 2013 x 1)  Dr. Joshua   BREAST BIOPSY Left    core- neg   BREAST LUMPECTOMY Left 2018   BREAST SURGERY Left    breast biopsy   CERVICAL CONE BIOPSY     CERVICAL FUSION     times 2   COLONOSCOPY WITH PROPOFOL  N/A 04/18/2015   Procedure: COLONOSCOPY WITH PROPOFOL ;  Surgeon: Donnice Vaughn Manes, MD;  Location: [facility] ENDOSCOPY;  Service: Endoscopy;  Laterality: N/A;   ELBOW SURGERY     2014 b/l elbows per pt ulnar nerve    ELBOW SURGERY     x2    EPIDURAL BLOCK INJECTION     Dr. Mindi  (multiple)   MASS EXCISION Left  04/12/2016   Procedure: EXCISION OF LEFT NIPPLE MASS;  Surgeon: Jina Nephew, MD;  Location: [facility];  Service: General;  Laterality: Left;   NECK SURGERY     POSTERIOR CERVICAL FUSION/FORAMINOTOMY  03/07/2011   Procedure: POSTERIOR CERVICAL FUSION/FORAMINOTOMY LEVEL 4;  Surgeon: Alm GORMAN Joshua, MD;  Location: [facility];  Service: Neurosurgery;  Laterality: N/A;  cervical three to thoracic one posterior cervical fusion    SENTINEL NODE BIOPSY Left 05/29/2016   Procedure: LEFT SENTINEL LYMPH NODE BIOPSY;  Surgeon: Jina Nephew, MD;  Location: [facility] OR;  Service: General;  Laterality: Left;   TONSILLECTOMY     Patient Active Problem List   Diagnosis Date Noted   S/P cervical spinal fusion 08/22/2022   Elevated alkaline phosphatase level 07/02/2022   Diarrhea 07/02/2022   Mood disorder (HCC) 04/27/2022   Need for pneumococcal 20-valent conjugate vaccination 04/27/2022   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) 04/27/2022   Essential (hemorrhagic) thrombocythemia (HCC) 04/27/2022   Paresthesia  of hand 04/24/2022   Carotid stenosis 03/26/2021   Acute renal failure due to traumatic rhabdomyolysis (HCC) 02/17/2021   Spinal stenosis of lumbar region 12/20/2020   H/O cervical spine surgery 12/20/2020   Abnormal MRI, cervical spine 12/20/2020   Abnormal MRI, lumbar spine 12/20/2020   Arthritis of neck 12/20/2020   Arthritis, lumbar spine 12/20/2020   Abnormal gait 12/20/2020   Frequent falls 12/20/2020   Cervical radiculopathy 12/20/2020   Numbness of fingers of both hands 12/20/2020   Carotid bruit present 10/06/2020   Noncompliance with treatment plan 09/16/2020   Polyp of colon 09/04/2020   Intrahepatic bile duct dilation 09/04/2020   Adrenal adenoma, left 09/04/2020   Atherosclerosis of renal artery (HCC) 09/04/2020   Elevated brain natriuretic peptide (BNP) level 08/09/2020   MDD (major depressive disorder), recurrent,  in full remission (HCC) 07/28/2020   Thoracic spine pain 07/28/2020   MDD (major depressive disorder), recurrent, in partial remission (HCC) 06/02/2020   Cervical spondylosis 05/24/2020   Aortic atherosclerosis (HCC) 05/02/2020   Tubular adenoma of colon 04/28/2020   High risk medication use 09/15/2019   At risk for long QT syndrome 01/13/2019   Chronic left shoulder pain 12/31/2018   Cervical postlaminectomy syndrome 10/20/2018   Alcohol use disorder, mild, abuse 09/16/2018   Pulmonary emphysema (HCC) 01/22/2018   HTN (hypertension) 05/06/2017   HLD (hyperlipidemia) 05/02/2017   Osteopenia 05/01/2017   Vitamin D  deficiency 01/27/2017   Chronic pain syndrome 01/27/2017   Chronic insomnia 01/27/2017   Tobacco use disorder 01/27/2017   Malignant neoplasm of lower-outer quadrant of left breast of female, estrogen receptor positive (HCC) 04/20/2016   Alcohol use 02/07/2016   Cervical post-laminectomy syndrome 09/06/2015   Lumbar radiculopathy 10/31/2012   Neuropathy 12/31/2006    PCP: No PCP   REFERRING PROVIDER: Dr. Pastor Falling    REFERRING DIAG: M54.12 (ICD-10-CM) - Cervical radiculopathy   THERAPY DIAG:  Cervicalgia  Cervical radiculopathy  Rationale for Evaluation and Treatment: Rehabilitation  ONSET DATE: 2008   SUBJECTIVE:                                                                                                                                                                                                         SUBJECTIVE STATEMENT: See pertinent history   Hand dominance: Right  PERTINENT HISTORY:  Pt reports that her symptoms started in 2008 and she experienced right sided neck pain. She has had a number of cervical decompression surgeries starting in 2010, 2013 x2 and one last year. She describes having myelopathy that made her trip  over her feet and that has since resolved since having the surgery. She also had prior surgery on ulnar nerve in elbows and  two steroid joint injections in her right shoulder with the most recent being in May and she reports this helped significantly. She is currently feeling neurogenic pain that radiates down her right arm into the palmar surface of her hand and it is felt in all her fingers. It has been decreasing her grip strength in her right hand.    PAIN:  Are you having pain? Yes: NPRS scale: 6/10 (currently), 8-9/10 at its worst Pain location: Right upper trap and down into palmar surface of hand  Pain description: Achy and throbbing    Aggravating factors: Using her right arm and hand like grasping objects   Relieving factors: Gabapentin  and Bacolofen    PRECAUTIONS: None  RED FLAGS: Cervical red flags: dysphagia but not worsening but she has noticed      WEIGHT BEARING RESTRICTIONS: No  FALLS:  Has patient fallen in last 6 months? Yes. Number of falls 4;  usually falls going up steps because she struggles with toe drag especially on the right leg     LIVING ENVIRONMENT: Lives with: lives alone Lives in: House/apartment Stairs: Yes: External: 4 steps; none Has following equipment at home: None  OCCUPATION: Not working    PLOF: Independent  PATIENT GOALS: To improve the pain and movement in her right arm and to know what to do to make it better and not to make it worse. She wants to return to being active with exercise, gardening, and yard work.   NEXT MD VISIT: Nothing scheduled at the moment    OBJECTIVE:  Note: Objective measures were completed at Evaluation unless otherwise noted.  VITALS: BP 182/75   HR 84  SpO2 100%   DIAGNOSTIC FINDINGS:  No imaging listed in chart for cervical region in past year.   PATIENT SURVEYS:  NDI:  NECK DISABILITY INDEX  Date: 10/02/23 Score  Pain intensity 2 = The pain is moderate at the moment  2. Personal care (washing, dressing, etc.) 1 =  I can look after myself normally but it causes extra pain  3. Lifting 4 =  I can only lift very light  weights  4. Reading 1 = I can read as much as I want to with slight pain in my neck  5. Headaches 0 = I have no headaches at all  6. Concentration 2 = I have a fair degree of difficulty in concentrating when I want to  7. Work 3 =  I cannot do my usual work  8. Driving 2 =  I can drive my car as long as I want with moderate pain in my neck  9. Sleeping 4 = My sleep is greatly disturbed (3-5 hrs sleepless)   10. Recreation 5 = I can't do any recreation activities at all  Total 24/50 (48%)   Minimum Detectable Change (90% confidence): 5 points or 10% points  COGNITION: Overall cognitive status: Within functional limits for tasks assessed  SENSATION: Light touch: Impaired   POSTURE: rounded shoulders  PALPATION: AC joint    CERVICAL ROM:   Active ROM A/PROM (deg) eval  Flexion 45  Extension 45  Right lateral flexion 40  Left lateral flexion 45  Right rotation 60  Left rotation 55   (Blank rows = not tested)  Active ROM Right eval Left eval  Shoulder flexion 110* 115*  Shoulder extension    Shoulder abduction 90* 110  Shoulder adduction    Shoulder internal rotation    Shoulder external rotation    Elbow flexion    Elbow extension    Wrist flexion    Wrist extension    Wrist ulnar deviation    Wrist radial deviation    Wrist pronation    Wrist supination    Combined ER  Occiput Occiput  Combined IR   PSIS PSIS                             (Blank rows = not tested)        UPPER EXTREMITY ROM:  Active/Passive ROM Right eval Left eval  Shoulder flexion 110* 115*  Shoulder extension    Shoulder abduction 60* 90*  Shoulder adduction    Shoulder extension    Shoulder internal rotation    Shoulder external rotation    Elbow flexion    Elbow extension    Wrist flexion    Wrist extension    Wrist ulnar deviation    Wrist radial deviation    Wrist pronation    Wrist supination     (Blank rows = not  tested)  UPPER EXTREMITY MMT:  MMT Right eval Left eval  Shoulder flexion 3* 3+*  Shoulder extension    Shoulder abduction 3-* 3*  Shoulder adduction    Shoulder extension    Shoulder internal rotation    Shoulder external rotation    Middle trapezius    Lower trapezius    Elbow flexion    Elbow extension    Wrist flexion    Wrist extension    Wrist ulnar deviation    Wrist radial deviation    Wrist pronation    Wrist supination    Grip strength     (Blank rows = not tested)    TREATMENT DATE:   10/02/23 Upper trap stretch 4 x 60 sec                                                                                                                                   PATIENT EDUCATION:  Education details: Form and technique for correct performance of exercise.  Person educated: Patient Education method: Explanation, Demonstration, Verbal cues, and Handouts Education comprehension: verbalized understanding, returned demonstration, and verbal cues required  HOME EXERCISE PROGRAM: Access Code: [email or web address] URL: [email or web address] Date: 10/02/2023 Prepared by: Toribio Servant  Exercises - Seated Upper Trapezius Stretch  - 1 x daily - 7 x weekly - 3 reps - 60 sec  hold - Seated Upper Trapezius Stretch (Mirrored)  - 1 x daily - 7 x weekly - 3 reps - 60 sec  hold  ASSESSMENT:  CLINICAL IMPRESSION: Patient is a 68 y.o. white female who was seen today for  physical therapy evaluation and treatment for right sided cervical pain with cervical radiculopathy down right arm. PMH includes having several cervical decompression surgeries, but she continues to have neck pain and right sided cervical radiculopathy. She has deficits that include decreased cervical and shoulder mobility and decreased shoulder strength and increased pain with movement especially when reaching overhead. She will benefit from skilled PT to address these aforementioned deficits to improve cervical  and shoulder function to return to completing UE tasks like combing her hair and digging in dirt without being limited by pain and discomfort.   OBJECTIVE IMPAIRMENTS: difficulty walking, decreased ROM, decreased strength, impaired flexibility, impaired sensation, impaired UE functional use, postural dysfunction, and pain.   ACTIVITY LIMITATIONS: carrying, lifting, sleeping, bathing, toileting, dressing, reach over head, and hygiene/grooming  PARTICIPATION LIMITATIONS: meal prep, cleaning, laundry, driving, shopping, community activity, and yard work  PERSONAL FACTORS: Age, Time since onset of injury/illness/exacerbation, and 1-2 comorbidities: HTN and  are also affecting patient's functional outcome.   REHAB POTENTIAL: Fair chronicity of cervical pain and prior rehab stints    CLINICAL DECISION MAKING: Evolving/moderate complexity  EVALUATION COMPLEXITY: Moderate   GOALS: Goals reviewed with patient? No  SHORT TERM GOALS: Target date: 10/16/2023  Patient will demonstrate undestanding of home exercise plan by performing exercises correctly with evidence of good carry over with min to no verbal or tactile cues .   Baseline:  NT   Goal status: INITIAL  2.  Patient will understanding pain coping strategies like activity modification and graded motions to avoid pain exacerbations.  Baseline:  NT  Goal status: INITIAL    LONG TERM GOALS: Target date: 12/25/2023  Patient will show a statistically significant improvement in her neck function as evidence by >=7.5 pt decrease in her neck disability index score to better be able to move neck to improve visual field while driving to avoid accidents. Vernel et al, 2009) Baseline:  24/50 (48%) Goal status: INITIAL  2.  Patient will increase cervical, shoulder, and periscapular strength by >=1/3 grade MMT (ie 4- to 4) for improved cervical function and stability to carry out UE tasks without pain and discomfort.  Baseline: Shoulder Flex R/L  3*/3+, Shoulder Abd 3-*/3 Goal status: INITIAL  3.  Patient will be able to perform overhead UE activities like reaching to grasp objects or combining hair without exceeding cervical pain that is >=4/10 NRPS as evidence of improved cervical function.  Baseline: 7-8/10 NRPS in right side of upper trap and  radiating down arm into palmar surface of hand. Goal status: INITIAL    PLAN:  PT FREQUENCY: 1-2x/week  PT DURATION: 12 weeks  PLANNED INTERVENTIONS: 97164- PT Re-evaluation, 97750- Physical Performance Testing, 97110-Therapeutic exercises, 97530- Therapeutic activity, 97112- Neuromuscular re-education, 97535- Self Care, [id number]- Manual therapy, [phone]- Aquatic Therapy, [id number]- Electrical stimulation (unattended), [phone]- Electrical stimulation (manual), 20560 (1-2 muscles), 20561 (3+ muscles)- Dry Needling, Patient/Family education, Balance training, Joint mobilization, Joint manipulation, Spinal manipulation, Spinal mobilization, Vestibular training, DME instructions, Cryotherapy, Moist heat, and Biofeedback  PLAN FOR NEXT SESSION: TENS Unit. Shoulder Pulleys. Finish shoulder MMT and periscapular MMT. Median nerve glide.     Toribio Servant PT, DPT  [facility] [address] [place], [address] Phone: [phone]   Fax:  [phone]

## 2023-10-09 ENCOUNTER — Encounter: Payer: Self-pay | Admitting: Physical Therapy

## 2023-10-09 ENCOUNTER — Ambulatory Visit: Attending: Neurology | Admitting: Physical Therapy

## 2023-10-09 DIAGNOSIS — M542 Cervicalgia: Secondary | ICD-10-CM | POA: Insufficient documentation

## 2023-10-09 DIAGNOSIS — M5412 Radiculopathy, cervical region: Secondary | ICD-10-CM | POA: Insufficient documentation

## 2023-10-09 NOTE — Therapy (Signed)
 OUTPATIENT PHYSICAL THERAPY CERVICAL TREATMENT     Patient Name: Ashley Cross MRN: [id number] DOB:01/12/1956, 68 y.o., female Today's Date: 10/09/2023  END OF SESSION:  PT End of Session - 10/09/23 0814     Visit Number 2    Number of Visits 24    Date for PT Re-Evaluation 12/25/23    Authorization Type [id number]    Authorization - Visit Number 2    Authorization - Number of Visits 24    Progress Note Due on Visit 10    PT Start Time 0815    PT Stop Time 0900    PT Time Calculation (min) 45 min    Activity Tolerance Patient limited by pain    Behavior During Therapy [facility] for tasks assessed/performed          Past Medical History:  Diagnosis Date   Alcohol use    Anemia    hx   Anxiety    Aortic regurgitation 08/29/2020   Breast cancer (HCC)    Left breast(nipple mass) 2018   Cervical stenosis of spine    Chicken pox    Chronic back pain    Colon polyps    COPD (chronic obstructive pulmonary disease) (HCC)    Cyst of left nipple    Depression    Hypertension    Insomnia    hx   Personal history of radiation therapy    Past Surgical History:  Procedure Laterality Date   ANTERIOR CERVICAL DECOMP/DISCECTOMY FUSION N/A 08/22/2022   Procedure: Anterior Cervical Decompression Fusion  - Cervical three-Cervical four;  Surgeon: Joshua Alm Hamilton, MD;  Location: [facility] OR;  Service: Neurosurgery;  Laterality: N/A;   BACK SURGERY     x 3 (2010 x 2; 2013 x 1)  Dr. Joshua   BREAST BIOPSY Left    core- neg   BREAST LUMPECTOMY Left 2018   BREAST SURGERY Left    breast biopsy   CERVICAL CONE BIOPSY     CERVICAL FUSION     times 2   COLONOSCOPY WITH PROPOFOL  N/A 04/18/2015   Procedure: COLONOSCOPY WITH PROPOFOL ;  Surgeon: Donnice Vaughn Manes, MD;  Location: [facility] ENDOSCOPY;  Service: Endoscopy;  Laterality: N/A;   ELBOW SURGERY     2014 b/l elbows per pt ulnar nerve    ELBOW SURGERY     x2    EPIDURAL BLOCK INJECTION     Dr. Mindi  (multiple)   MASS EXCISION Left  04/12/2016   Procedure: EXCISION OF LEFT NIPPLE MASS;  Surgeon: Jina Nephew, MD;  Location: [facility];  Service: General;  Laterality: Left;   NECK SURGERY     POSTERIOR CERVICAL FUSION/FORAMINOTOMY  03/07/2011   Procedure: POSTERIOR CERVICAL FUSION/FORAMINOTOMY LEVEL 4;  Surgeon: Alm GORMAN Joshua, MD;  Location: [facility] NEURO ORS;  Service: Neurosurgery;  Laterality: N/A;  cervical three to thoracic one posterior cervical fusion    SENTINEL NODE BIOPSY Left 05/29/2016   Procedure: LEFT SENTINEL LYMPH NODE BIOPSY;  Surgeon: Jina Nephew, MD;  Location: [facility] OR;  Service: General;  Laterality: Left;   TONSILLECTOMY     Patient Active Problem List   Diagnosis Date Noted   S/P cervical spinal fusion 08/22/2022   Elevated alkaline phosphatase level 07/02/2022   Diarrhea 07/02/2022   Mood disorder (HCC) 04/27/2022   Need for pneumococcal 20-valent conjugate vaccination 04/27/2022   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) 04/27/2022   Essential (hemorrhagic) thrombocythemia (HCC) 04/27/2022  Paresthesia of hand 04/24/2022   Carotid stenosis 03/26/2021   Acute renal failure due to traumatic rhabdomyolysis (HCC) 02/17/2021   Spinal stenosis of lumbar region 12/20/2020   H/O cervical spine surgery 12/20/2020   Abnormal MRI, cervical spine 12/20/2020   Abnormal MRI, lumbar spine 12/20/2020   Arthritis of neck 12/20/2020   Arthritis, lumbar spine 12/20/2020   Abnormal gait 12/20/2020   Frequent falls 12/20/2020   Cervical radiculopathy 12/20/2020   Numbness of fingers of both hands 12/20/2020   Carotid bruit present 10/06/2020   Noncompliance with treatment plan 09/16/2020   Polyp of colon 09/04/2020   Intrahepatic bile duct dilation 09/04/2020   Adrenal adenoma, left 09/04/2020   Atherosclerosis of renal artery (HCC) 09/04/2020   Elevated brain natriuretic peptide (BNP) level 08/09/2020   MDD (major depressive disorder), recurrent,  in full remission (HCC) 07/28/2020   Thoracic spine pain 07/28/2020   MDD (major depressive disorder), recurrent, in partial remission (HCC) 06/02/2020   Cervical spondylosis 05/24/2020   Aortic atherosclerosis (HCC) 05/02/2020   Tubular adenoma of colon 04/28/2020   High risk medication use 09/15/2019   At risk for long QT syndrome 01/13/2019   Chronic left shoulder pain 12/31/2018   Cervical postlaminectomy syndrome 10/20/2018   Alcohol use disorder, mild, abuse 09/16/2018   Pulmonary emphysema (HCC) 01/22/2018   HTN (hypertension) 05/06/2017   HLD (hyperlipidemia) 05/02/2017   Osteopenia 05/01/2017   Vitamin D  deficiency 01/27/2017   Chronic pain syndrome 01/27/2017   Chronic insomnia 01/27/2017   Tobacco use disorder 01/27/2017   Malignant neoplasm of lower-outer quadrant of left breast of female, estrogen receptor positive (HCC) 04/20/2016   Alcohol use 02/07/2016   Cervical post-laminectomy syndrome 09/06/2015   Lumbar radiculopathy 10/31/2012   Neuropathy 12/31/2006    PCP: No PCP   REFERRING PROVIDER: Dr. Pastor Falling    REFERRING DIAG: M54.12 (ICD-10-CM) - Cervical radiculopathy   THERAPY DIAG:  Cervicalgia  Cervical radiculopathy  Rationale for Evaluation and Treatment: Rehabilitation  ONSET DATE: 2008   SUBJECTIVE:                                                                                                                                                                                                         SUBJECTIVE STATEMENT: Pt reports increased activity since last session with moving sticks in her backyard. She did feel some residual pain from all the activity but this did not stop her from doing activities. She describes feeling pain at night and numbness and tingling but that this does not stop her from sleeping.  Hand dominance: Right  PERTINENT HISTORY:  Pt reports that her symptoms started in 2008 and she experienced right sided neck pain. She  has had a number of cervical decompression surgeries starting in 2010, 2013 x2 and one last year. She describes having myelopathy that made her trip over her feet and that has since resolved since having the surgery. She also had prior surgery on ulnar nerve in elbows and two steroid joint injections in her right shoulder with the most recent being in May and she reports this helped significantly. She is currently feeling neurogenic pain that radiates down her right arm into the palmar surface of her hand and it is felt in all her fingers. It has been decreasing her grip strength in her right hand.    PAIN:  Are you having pain? Yes: NPRS scale: 4/10 (currently), 8-9/10 at its worst Pain location: Right upper trap and down into palmar surface of hand  Pain description: Achy and throbbing    Aggravating factors: Using her right arm and hand like grasping objects   Relieving factors: Gabapentin  and Bacolofen    PRECAUTIONS: None  RED FLAGS: Cervical red flags: dysphagia but not worsening but she has noticed      WEIGHT BEARING RESTRICTIONS: No  FALLS:  Has patient fallen in last 6 months? Yes. Number of falls 4;  usually falls going up steps because she struggles with toe drag especially on the right leg     LIVING ENVIRONMENT: Lives with: lives alone Lives in: House/apartment Stairs: Yes: External: 4 steps; none Has following equipment at home: None  OCCUPATION: Not working    PLOF: Independent  PATIENT GOALS: To improve the pain and movement in her right arm and to know what to do to make it better and not to make it worse. She wants to return to being active with exercise, gardening, and yard work.   NEXT MD VISIT: Nothing scheduled at the moment    OBJECTIVE:  Note: Objective measures were completed at Evaluation unless otherwise noted.  VITALS: BP 182/75   HR 84  SpO2 100%   DIAGNOSTIC FINDINGS:  No imaging listed in chart for cervical region in past year.   PATIENT  SURVEYS:  NDI:  NECK DISABILITY INDEX  Date: 10/02/23 Score  Pain intensity 2 = The pain is moderate at the moment  2. Personal care (washing, dressing, etc.) 1 =  I can look after myself normally but it causes extra pain  3. Lifting 4 =  I can only lift very light weights  4. Reading 1 = I can read as much as I want to with slight pain in my neck  5. Headaches 0 = I have no headaches at all  6. Concentration 2 = I have a fair degree of difficulty in concentrating when I want to  7. Work 3 =  I cannot do my usual work  8. Driving 2 =  I can drive my car as long as I want with moderate pain in my neck  9. Sleeping 4 = My sleep is greatly disturbed (3-5 hrs sleepless)   10. Recreation 5 = I can't do any recreation activities at all  Total 24/50 (48%)   Minimum Detectable Change (90% confidence): 5 points or 10% points  COGNITION: Overall cognitive status: Within functional limits for tasks assessed  SENSATION: Light touch: Impaired   POSTURE: rounded shoulders  PALPATION: AC joint    CERVICAL ROM:   Active ROM A/PROM (deg) eval  Flexion 45  Extension 45  Right lateral flexion 40  Left lateral flexion 45  Right rotation 60  Left rotation 55   (Blank rows = not tested)                                     Active ROM Right eval Left eval  Shoulder flexion 110* 115*  Shoulder extension    Shoulder abduction 90* 110  Shoulder adduction    Shoulder internal rotation    Shoulder external rotation    Elbow flexion    Elbow extension    Wrist flexion    Wrist extension    Wrist ulnar deviation    Wrist radial deviation    Wrist pronation    Wrist supination    Combined ER  Occiput Occiput  Combined IR   PSIS PSIS                             (Blank rows = not tested)        UPPER EXTREMITY ROM:  Active/Passive ROM Right eval Left eval  Shoulder flexion 110* 115*  Shoulder extension    Shoulder abduction 60* 90*  Shoulder adduction    Shoulder  extension    Shoulder internal rotation    Shoulder external rotation    Elbow flexion    Elbow extension    Wrist flexion    Wrist extension    Wrist ulnar deviation    Wrist radial deviation    Wrist pronation    Wrist supination     (Blank rows = not tested)  UPPER EXTREMITY MMT:  MMT Right eval Left eval  Shoulder flexion 3* 3+*  Shoulder extension    Shoulder abduction 3-* 3*  Shoulder adduction    Shoulder extension    Shoulder internal rotation    Shoulder external rotation    Middle trapezius    Lower trapezius    Elbow flexion    Elbow extension    Wrist flexion    Wrist extension    Wrist ulnar deviation    Wrist radial deviation    Wrist pronation    Wrist supination    Grip strength     (Blank rows = not tested)    TREATMENT DATE:   10/09/23: THEREX   Shoulder flexion and extension AAROM Pulleys 2 x 10   Shoulder abduction and adduction AAROM Pulleys 2 x 10   Ankle DF MMT R 4+   SELF CARE HOME MANAGEMENT   Reviewed a variety of sleeping positions that promote cervical flexion to decrease symptoms: Supine with two pillows          -Increased numbness and tingling in bilateral arms  Supine with three pillows         -Increased numbness and tingling in bilateral arms  Supine with inclined back and two pillows       -Increased numbness and tingling in bilateral arms   Activity Pacing  Described the cycle between overactivity, increased pain, and increased rest and how pacing will help with this.  Provided patient with handout about how to track active and rest time with activities to better pace herself and encouraged her to do with activity over the rest of week that she regularly engages in.   PATIENT EDUCATION:  Education details: Form and technique for correct performance of exercise.  Person educated: Patient Education method: Explanation, Demonstration, Verbal cues, and Handouts Education comprehension: verbalized understanding, returned  demonstration, and verbal cues required  HOME EXERCISE PROGRAM: Access Code: [email or web address] URL: [email or web address] Date: 10/02/2023 Prepared by: Toribio Servant  Exercises - Seated Upper Trapezius Stretch  - 1 x daily - 7 x weekly - 3 reps - 60 sec  hold - Seated Upper Trapezius Stretch (Mirrored)  - 1 x daily - 7 x weekly - 3 reps - 60 sec  hold  ASSESSMENT:  CLINICAL IMPRESSION: Pt presents for initial treatment following eval for cervical pain along with radicular symptoms. She was somewhat limited by her cervical pain when performing pulleys, but she was able to perform by reducing range of motion and avoiding reaching beyond 120 degrees abduction. PT educated on activity pacing to avoid further pain exacerbation. She does not show a clear directional pattern between cervical flexion and extension, but flexion does appear to be less pain provoking. PT instructed pt to bring in her pillow next session to determine her neck position when sleeping and to provide feedback. She will continue to  benefit from skilled PT to address these aforementioned deficits to improve cervical and shoulder function to return to completing UE tasks like combing her hair and digging in dirt without being limited by pain and discomfort.    OBJECTIVE IMPAIRMENTS: difficulty walking, decreased ROM, decreased strength, impaired flexibility, impaired sensation, impaired UE functional use, postural dysfunction, and pain.   ACTIVITY LIMITATIONS: carrying, lifting, sleeping, bathing, toileting, dressing, reach over head, and hygiene/grooming  PARTICIPATION LIMITATIONS: meal prep, cleaning, laundry, driving, shopping, community activity, and yard work  PERSONAL FACTORS: Age, Time since onset of injury/illness/exacerbation, and 1-2 comorbidities: HTN and  are also affecting patient's functional outcome.   REHAB POTENTIAL: Fair chronicity of cervical pain and prior rehab stints    CLINICAL DECISION MAKING:  Evolving/moderate complexity  EVALUATION COMPLEXITY: Moderate   GOALS: Goals reviewed with patient? No  SHORT TERM GOALS: Target date: 10/16/2023  Patient will demonstrate undestanding of home exercise plan by performing exercises correctly with evidence of good carry over with min to no verbal or tactile cues .   Baseline:  Performing independently   Goal status: ACHIEVED    2.  Patient will understanding pain coping strategies like activity modification and graded motions to avoid pain exacerbations.  Baseline:  NT  Goal status: ONGOING      LONG TERM GOALS: Target date: 12/25/2023  Patient will show a statistically significant improvement in her neck function as evidence by >=7.5 pt decrease in her neck disability index score to better be able to move neck to improve visual field while driving to avoid accidents. Vernel et al, 2009) Baseline:  24/50 (48%) Goal status: ONGOING    2.  Patient will increase cervical, shoulder, and periscapular strength by >=1/3 grade MMT (ie 4- to 4) for improved cervical function and stability to carry out UE tasks without pain and discomfort.  Baseline: Shoulder Flex R/L 3*/3+, Shoulder Abd 3-*/3 Goal status: ONGOING   3.  Patient will be able to perform overhead UE activities like reaching to grasp objects or combining hair without exceeding cervical pain that is >=4/10 NRPS as evidence of improved cervical function.  Baseline: 7-8/10 NRPS in right side of upper trap and  radiating down arm into palmar surface of hand. Goal status: ONGOING      PLAN:  PT FREQUENCY: 1-2x/week  PT DURATION: 12 weeks  PLANNED INTERVENTIONS: 97164- PT Re-evaluation, 97750- Physical Performance  Testing, 97110-Therapeutic exercises, 97530- Therapeutic activity, [id number]- Neuromuscular re-education, [phone]- Self Care, [id number]- Manual therapy, [phone]- Aquatic Therapy, [phone]- Electrical stimulation (unattended), [phone]- Electrical stimulation (manual), [phone] (1-2 muscles),  [phone] (3+ muscles)- Dry Needling, Patient/Family education, Balance training, Joint mobilization, Joint manipulation, Spinal manipulation, Spinal mobilization, Vestibular training, DME instructions, Cryotherapy, Moist heat, and Biofeedback  PLAN FOR NEXT SESSION: TENS Unit.  Finish shoulder MMT and periscapular MMT. Median nerve glide. Continue with educating about sleeping position.   Toribio Servant PT, DPT  [facility] [address] [place], [address] Phone: [phone]   Fax:  [phone]

## 2023-10-14 ENCOUNTER — Ambulatory Visit: Admitting: Physical Therapy

## 2023-10-15 ENCOUNTER — Ambulatory Visit: Admitting: Physical Therapy

## 2023-10-15 ENCOUNTER — Encounter: Payer: Self-pay | Admitting: Physical Therapy

## 2023-10-15 DIAGNOSIS — M542 Cervicalgia: Secondary | ICD-10-CM | POA: Diagnosis not present

## 2023-10-15 DIAGNOSIS — M5412 Radiculopathy, cervical region: Secondary | ICD-10-CM

## 2023-10-15 NOTE — Therapy (Signed)
 OUTPATIENT PHYSICAL THERAPY CERVICAL TREATMENT     Patient Name: Ashley Cross MRN: [id number] DOB:[birth year], 68 y.o., female Today's Date: 10/15/2023  END OF SESSION:  PT End of Session - 10/15/23 1352     Visit Number 3    Number of Visits 24    Date for PT Re-Evaluation 12/25/23    Authorization Type [id number]    Authorization - Visit Number 3    Authorization - Number of Visits 24    Progress Note Due on Visit 10    PT Start Time 1345    PT Stop Time 1430    PT Time Calculation (min) 45 min    Activity Tolerance Patient limited by pain    Behavior During Therapy [facility] for tasks assessed/performed          Past Medical History:  Diagnosis Date   Alcohol use    Anemia    hx   Anxiety    Aortic regurgitation 08/29/2020   Breast cancer (HCC)    Left breast(nipple mass) 2018   Cervical stenosis of spine    Chicken pox    Chronic back pain    Colon polyps    COPD (chronic obstructive pulmonary disease) (HCC)    Cyst of left nipple    Depression    Hypertension    Insomnia    hx   Personal history of radiation therapy    Past Surgical History:  Procedure Laterality Date   ANTERIOR CERVICAL DECOMP/DISCECTOMY FUSION N/A 08/22/2022   Procedure: Anterior Cervical Decompression Fusion  - Cervical three-Cervical four;  Surgeon: Joshua Alm Hamilton, MD;  Location: [facility] OR;  Service: Neurosurgery;  Laterality: N/A;   BACK SURGERY     x 3 (2010 x 2; 2013 x 1)  Dr. Joshua   BREAST BIOPSY Left    core- neg   BREAST LUMPECTOMY Left 2018   BREAST SURGERY Left    breast biopsy   CERVICAL CONE BIOPSY     CERVICAL FUSION     times 2   COLONOSCOPY WITH PROPOFOL  N/A 04/18/2015   Procedure: COLONOSCOPY WITH PROPOFOL ;  Surgeon: Donnice Vaughn Manes, MD;  Location: [facility] ENDOSCOPY;  Service: Endoscopy;  Laterality: N/A;   ELBOW SURGERY     2014 b/l elbows per pt ulnar nerve    ELBOW SURGERY     x2    EPIDURAL BLOCK INJECTION     Dr. Mindi  (multiple)   MASS EXCISION Left  04/12/2016   Procedure: EXCISION OF LEFT NIPPLE MASS;  Surgeon: Jina Nephew, MD;  Location: [facility];  Service: General;  Laterality: Left;   NECK SURGERY     POSTERIOR CERVICAL FUSION/FORAMINOTOMY  03/07/2011   Procedure: POSTERIOR CERVICAL FUSION/FORAMINOTOMY LEVEL 4;  Surgeon: Alm GORMAN Joshua, MD;  Location: [facility] NEURO ORS;  Service: Neurosurgery;  Laterality: N/A;  cervical three to thoracic one posterior cervical fusion    SENTINEL NODE BIOPSY Left 05/29/2016   Procedure: LEFT SENTINEL LYMPH NODE BIOPSY;  Surgeon: Jina Nephew, MD;  Location: [facility] OR;  Service: General;  Laterality: Left;   TONSILLECTOMY     Patient Active Problem List   Diagnosis Date Noted   S/P cervical spinal fusion 08/22/2022   Elevated alkaline phosphatase level 07/02/2022   Diarrhea 07/02/2022   Mood disorder (HCC) 04/27/2022   Need for pneumococcal 20-valent conjugate vaccination 04/27/2022   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) 04/27/2022   Essential (hemorrhagic) thrombocythemia (HCC) 04/27/2022  Paresthesia of hand 04/24/2022   Carotid stenosis 03/26/2021   Acute renal failure due to traumatic rhabdomyolysis (HCC) 02/17/2021   Spinal stenosis of lumbar region 12/20/2020   H/O cervical spine surgery 12/20/2020   Abnormal MRI, cervical spine 12/20/2020   Abnormal MRI, lumbar spine 12/20/2020   Arthritis of neck 12/20/2020   Arthritis, lumbar spine 12/20/2020   Abnormal gait 12/20/2020   Frequent falls 12/20/2020   Cervical radiculopathy 12/20/2020   Numbness of fingers of both hands 12/20/2020   Carotid bruit present 10/06/2020   Noncompliance with treatment plan 09/16/2020   Polyp of colon 09/04/2020   Intrahepatic bile duct dilation 09/04/2020   Adrenal adenoma, left 09/04/2020   Atherosclerosis of renal artery (HCC) 09/04/2020   Elevated brain natriuretic peptide (BNP) level 08/09/2020   MDD (major depressive disorder), recurrent,  in full remission (HCC) 07/28/2020   Thoracic spine pain 07/28/2020   MDD (major depressive disorder), recurrent, in partial remission (HCC) 06/02/2020   Cervical spondylosis 05/24/2020   Aortic atherosclerosis (HCC) 05/02/2020   Tubular adenoma of colon 04/28/2020   High risk medication use 09/15/2019   At risk for long QT syndrome 01/13/2019   Chronic left shoulder pain 12/31/2018   Cervical postlaminectomy syndrome 10/20/2018   Alcohol use disorder, mild, abuse 09/16/2018   Pulmonary emphysema (HCC) 01/22/2018   HTN (hypertension) 05/06/2017   HLD (hyperlipidemia) 05/02/2017   Osteopenia 05/01/2017   Vitamin D  deficiency 01/27/2017   Chronic pain syndrome 01/27/2017   Chronic insomnia 01/27/2017   Tobacco use disorder 01/27/2017   Malignant neoplasm of lower-outer quadrant of left breast of female, estrogen receptor positive (HCC) 04/20/2016   Alcohol use 02/07/2016   Cervical post-laminectomy syndrome 09/06/2015   Lumbar radiculopathy 10/31/2012   Neuropathy 12/31/2006    PCP: No PCP   REFERRING PROVIDER: Dr. Pastor Falling    REFERRING DIAG: M54.12 (ICD-10-CM) - Cervical radiculopathy   THERAPY DIAG:  Cervicalgia  Cervical radiculopathy  Rationale for Evaluation and Treatment: Rehabilitation  ONSET DATE: 2008   SUBJECTIVE:                                                                                                                                                                                                         SUBJECTIVE STATEMENT: Pt reports having a pain exacerbation after falling and hitting her head on a dresser. She did not get knocked out but she has worsening pain in her neck that has made it more difficult to perform exercises. She does reports that her neck stretches have really helped.   Hand dominance:  Right  PERTINENT HISTORY:  Pt reports that her symptoms started in 2008 and she experienced right sided neck pain. She has had a number of  cervical decompression surgeries starting in 2010, 2013 x2 and one last year. She describes having myelopathy that made her trip over her feet and that has since resolved since having the surgery. She also had prior surgery on ulnar nerve in elbows and two steroid joint injections in her right shoulder with the most recent being in May and she reports this helped significantly. She is currently feeling neurogenic pain that radiates down her right arm into the palmar surface of her hand and it is felt in all her fingers. It has been decreasing her grip strength in her right hand.    PAIN:  Are you having pain? Yes: NPRS scale: 7-8 (currently) Pain location: Right upper trap and down into palmar surface of hand  Pain description: Achy and throbbing    Aggravating factors: Using her right arm and hand like grasping objects   Relieving factors: Gabapentin  and Bacolofen    PRECAUTIONS: None  RED FLAGS: Cervical red flags: dysphagia but not worsening but she has noticed      WEIGHT BEARING RESTRICTIONS: No  FALLS:  Has patient fallen in last 6 months? Yes. Number of falls 4;  usually falls going up steps because she struggles with toe drag especially on the right leg     LIVING ENVIRONMENT: Lives with: lives alone Lives in: House/apartment Stairs: Yes: External: 4 steps; none Has following equipment at home: None  OCCUPATION: Not working    PLOF: Independent  PATIENT GOALS: To improve the pain and movement in her right arm and to know what to do to make it better and not to make it worse. She wants to return to being active with exercise, gardening, and yard work.   NEXT MD VISIT: Nothing scheduled at the moment    OBJECTIVE:  Note: Objective measures were completed at Evaluation unless otherwise noted.  VITALS: BP 182/75   HR 84  SpO2 100%   DIAGNOSTIC FINDINGS:  No imaging listed in chart for cervical region in past year.   PATIENT SURVEYS:  NDI:  NECK DISABILITY INDEX   Date: 10/02/23 Score  Pain intensity 2 = The pain is moderate at the moment  2. Personal care (washing, dressing, etc.) 1 =  I can look after myself normally but it causes extra pain  3. Lifting 4 =  I can only lift very light weights  4. Reading 1 = I can read as much as I want to with slight pain in my neck  5. Headaches 0 = I have no headaches at all  6. Concentration 2 = I have a fair degree of difficulty in concentrating when I want to  7. Work 3 =  I cannot do my usual work  8. Driving 2 =  I can drive my car as long as I want with moderate pain in my neck  9. Sleeping 4 = My sleep is greatly disturbed (3-5 hrs sleepless)   10. Recreation 5 = I can't do any recreation activities at all  Total 24/50 (48%)   Minimum Detectable Change (90% confidence): 5 points or 10% points  COGNITION: Overall cognitive status: Within functional limits for tasks assessed  SENSATION: Light touch: Impaired   POSTURE: rounded shoulders  PALPATION: AC joint    CERVICAL ROM:   Active ROM A/PROM (deg) eval  Flexion 45  Extension 45  Right lateral flexion 40  Left lateral flexion 45  Right rotation 60  Left rotation 55   (Blank rows = not tested)                                     Active ROM Right eval Left eval  Shoulder flexion 110* 115*  Shoulder extension    Shoulder abduction 90* 110  Shoulder adduction    Shoulder internal rotation    Shoulder external rotation    Elbow flexion    Elbow extension    Wrist flexion    Wrist extension    Wrist ulnar deviation    Wrist radial deviation    Wrist pronation    Wrist supination    Combined ER  Occiput Occiput  Combined IR   PSIS PSIS                             (Blank rows = not tested)        UPPER EXTREMITY ROM:  Active/Passive ROM Right eval Left eval  Shoulder flexion 110* 115*  Shoulder extension    Shoulder abduction 60* 90*  Shoulder adduction    Shoulder extension    Shoulder internal rotation     Shoulder external rotation    Elbow flexion    Elbow extension    Wrist flexion    Wrist extension    Wrist ulnar deviation    Wrist radial deviation    Wrist pronation    Wrist supination     (Blank rows = not tested)  UPPER EXTREMITY MMT:  MMT Right eval Left eval  Shoulder flexion 3* 3+*  Shoulder extension    Shoulder abduction 3-* 3*  Shoulder adduction    Shoulder extension    Shoulder internal rotation    Shoulder external rotation    Middle trapezius    Lower trapezius    Elbow flexion    Elbow extension    Wrist flexion    Wrist extension    Wrist ulnar deviation    Wrist radial deviation    Wrist pronation    Wrist supination    Grip strength     (Blank rows = not tested)    TREATMENT DATE:   10/15/23: THEREX   Supine Upper Trap Stretch  2 x 60 sec on cervical pillow    -Pt reports increased pain when side bending to the right     Supine Cervical Flexor Stretch  1 x 30 sec  -Pt reports increased radicular pain   Supine Cervical Extensor Stretch  1 x 30 sec   Supine Scapular Squeezes 1 x 10 with 3 sec hold    Supine Shoulder ER/IR at 0 deg abd AROM 2 x 10    -min VC to decrease wrist extension to avoid increasing radicular symptoms from elbow to hand   Supine Shoulder ER/IR at 0 deg abd isometric on RUE with 3 sec hold 1 x 10    -Pt reports increased pain in shoulders   Supine Shoulder ER/IR at 0 deg abd isometric on LUE with 3 sec hold 1 x 10    -Pt reports increased pain in shoulders   Seated Shoulder ER/IR at 0 deg abd isometric with towel hold 3 sec hold 1 x 10     PATIENT EDUCATION:  Education details: Form and technique for correct  performance of exercise.  Person educated: Patient Education method: Explanation, Demonstration, Verbal cues, and Handouts Education comprehension: verbalized understanding, returned demonstration, and verbal cues required  HOME EXERCISE PROGRAM: Access Code: [email or web address] URL:  [email or web address] Date: 10/15/2023 Prepared by: Toribio Servant  Exercises - Seated Upper Trapezius Stretch  - 1 x daily - 7 x weekly - 3 reps - 60 sec  hold - Seated Upper Trapezius Stretch (Mirrored)  - 1 x daily - 7 x weekly - 3 reps - 60 sec  hold - Seated Shoulder Flexion AAROM with Pulley in Front  - 1 x daily - 7 x weekly - 3 sets - 10 reps - Seated Shoulder Scaption AAROM with Pulley at Side  - 1 x daily - 7 x weekly - 3 sets - 10 reps - Supine Scapular Retraction  - 1 x daily - 7 x weekly - 2 sets - 10 reps - 3 sec hold - Standing Isometric Shoulder External Rotation With Towel  - 1 x daily - 7 x weekly - 2 sets - 10 reps - 3 sec hold - Ice  - 1 x daily - 7 x weekly - 2 reps - 10-15 min hold   ASSESSMENT:  CLINICAL IMPRESSION: Pt limited by pain during session due to recent fall. Red flags ruled out. Exercises modified to include supine positioning to decrease strain on cervical musculature and pt was able to tolerate most exercises in this position with exception of cervical extension for cervical flexor stretch. Pt also showed positive response to use of ice to decrease pain.  She will continue to  benefit from skilled PT to address these aforementioned deficits to improve cervical and shoulder function to return to completing UE tasks like combing her hair and digging in dirt without being limited by pain and discomfort.     OBJECTIVE IMPAIRMENTS: difficulty walking, decreased ROM, decreased strength, impaired flexibility, impaired sensation, impaired UE functional use, postural dysfunction, and pain.   ACTIVITY LIMITATIONS: carrying, lifting, sleeping, bathing, toileting, dressing, reach over head, and hygiene/grooming  PARTICIPATION LIMITATIONS: meal prep, cleaning, laundry, driving, shopping, community activity, and yard work  PERSONAL FACTORS: Age, Time since onset of injury/illness/exacerbation, and 1-2 comorbidities: HTN and  are also affecting patient's  functional outcome.   REHAB POTENTIAL: Fair chronicity of cervical pain and prior rehab stints    CLINICAL DECISION MAKING: Evolving/moderate complexity  EVALUATION COMPLEXITY: Moderate   GOALS: Goals reviewed with patient? No  SHORT TERM GOALS: Target date: 10/16/2023  Patient will demonstrate undestanding of home exercise plan by performing exercises correctly with evidence of good carry over with min to no verbal or tactile cues .   Baseline:  Performing independently   Goal status: ACHIEVED    2.  Patient will understanding pain coping strategies like activity modification and graded motions to avoid pain exacerbations.  Baseline:  NT 10/15/23: Understands not to push into pain to avoid exacerbation Goal status: ACHIEVED       LONG TERM GOALS: Target date: 12/25/2023  Patient will show a statistically significant improvement in her neck function as evidence by >=7.5 pt decrease in her neck disability index score to better be able to move neck to improve visual field while driving to avoid accidents. Vernel et al, 2009) Baseline:  24/50 (48%) Goal status: ONGOING    2.  Patient will increase cervical, shoulder, and periscapular strength by >=1/3 grade MMT (ie 4- to 4) for improved cervical function and stability to carry out UE tasks without pain and  discomfort.  Baseline: Shoulder Flex R/L 3*/3+, Shoulder Abd 3-*/3 Goal status: ONGOING   3.  Patient will be able to perform overhead UE activities like reaching to grasp objects or combining hair without exceeding cervical pain that is >=4/10 NRPS as evidence of improved cervical function.  Baseline: 7-8/10 NRPS in right side of upper trap and  radiating down arm into palmar surface of hand. Goal status: ONGOING      PLAN:  PT FREQUENCY: 1-2x/week  PT DURATION: 12 weeks  PLANNED INTERVENTIONS: 97164- PT Re-evaluation, 97750- Physical Performance Testing, 97110-Therapeutic exercises, 97530- Therapeutic activity, [id number]-  Neuromuscular re-education, 97535- Self Care, 02859- Manual therapy, [phone]- Aquatic Therapy, [id number]- Electrical stimulation (unattended), [phone]- Electrical stimulation (manual), 20560 (1-2 muscles), 20561 (3+ muscles)- Dry Needling, Patient/Family education, Balance training, Joint mobilization, Joint manipulation, Spinal manipulation, Spinal mobilization, Vestibular training, DME instructions, Cryotherapy, Moist heat, and Biofeedback  PLAN FOR NEXT SESSION: TENS Unit over spine.  Finish shoulder MMT and periscapular MMT. Median nerve glide. Attempt side lying positioning to promote shoulder AROM.  Toribio Servant PT, DPT  [facility] [address], [place], [address] Phone: [phone]   Fax:  [phone]

## 2023-10-17 ENCOUNTER — Ambulatory Visit: Admitting: Physical Therapy

## 2023-10-18 ENCOUNTER — Ambulatory Visit
Admission: RE | Admit: 2023-10-18 | Discharge: 2023-10-18 | Disposition: A | Discharge status: Home / Self Care | Source: Ambulatory Visit | Attending: General Surgery | Admitting: General Surgery

## 2023-10-18 DIAGNOSIS — Z1231 Encounter for screening mammogram for malignant neoplasm of breast: Secondary | ICD-10-CM | POA: Diagnosis not present

## 2023-10-21 ENCOUNTER — Ambulatory Visit: Admitting: Physical Therapy

## 2023-10-21 DIAGNOSIS — M542 Cervicalgia: Secondary | ICD-10-CM | POA: Diagnosis not present

## 2023-10-21 DIAGNOSIS — M5412 Radiculopathy, cervical region: Secondary | ICD-10-CM

## 2023-10-21 NOTE — Therapy (Signed)
 OUTPATIENT PHYSICAL THERAPY CERVICAL TREATMENT     Patient Name: Ashley Cross MRN: [id number] DOB:14-Dec-1955, 68 y.o., female Today's Date: 10/21/2023  END OF SESSION:  PT End of Session - 10/21/23 1258     Visit Number 4    Number of Visits 24    Date for PT Re-Evaluation 12/25/23    Authorization Type [id number]    Authorization - Visit Number 4    Authorization - Number of Visits 24    Progress Note Due on Visit 10    PT Start Time 1115    PT Stop Time 1200    PT Time Calculation (min) 45 min    Activity Tolerance Patient limited by pain    Behavior During Therapy [facility] for tasks assessed/performed           Past Medical History:  Diagnosis Date   Alcohol use    Anemia    hx   Anxiety    Aortic regurgitation 08/29/2020   Breast cancer (HCC)    Left breast(nipple mass) 2018   Cervical stenosis of spine    Chicken pox    Chronic back pain    Colon polyps    COPD (chronic obstructive pulmonary disease) (HCC)    Cyst of left nipple    Depression    Hypertension    Insomnia    hx   Personal history of radiation therapy    Past Surgical History:  Procedure Laterality Date   ANTERIOR CERVICAL DECOMP/DISCECTOMY FUSION N/A 08/22/2022   Procedure: Anterior Cervical Decompression Fusion  - Cervical three-Cervical four;  Surgeon: Joshua Alm Hamilton, MD;  Location: [facility] OR;  Service: Neurosurgery;  Laterality: N/A;   BACK SURGERY     x 3 (2010 x 2; 2013 x 1)  Dr. Joshua   BREAST BIOPSY Left    core- neg   BREAST LUMPECTOMY Left 2018   BREAST SURGERY Left    breast biopsy   CERVICAL CONE BIOPSY     CERVICAL FUSION     times 2   COLONOSCOPY WITH PROPOFOL  N/A 04/18/2015   Procedure: COLONOSCOPY WITH PROPOFOL ;  Surgeon: Donnice Vaughn Manes, MD;  Location: [facility] ENDOSCOPY;  Service: Endoscopy;  Laterality: N/A;   ELBOW SURGERY     2014 b/l elbows per pt ulnar nerve    ELBOW SURGERY     x2    EPIDURAL BLOCK INJECTION     Dr. Mindi  (multiple)   MASS EXCISION  Left 04/12/2016   Procedure: EXCISION OF LEFT NIPPLE MASS;  Surgeon: Jina Nephew, MD;  Location: [facility];  Service: General;  Laterality: Left;   NECK SURGERY     POSTERIOR CERVICAL FUSION/FORAMINOTOMY  03/07/2011   Procedure: POSTERIOR CERVICAL FUSION/FORAMINOTOMY LEVEL 4;  Surgeon: Alm GORMAN Joshua, MD;  Location: [facility] NEURO ORS;  Service: Neurosurgery;  Laterality: N/A;  cervical three to thoracic one posterior cervical fusion    SENTINEL NODE BIOPSY Left 05/29/2016   Procedure: LEFT SENTINEL LYMPH NODE BIOPSY;  Surgeon: Jina Nephew, MD;  Location: [facility] OR;  Service: General;  Laterality: Left;   TONSILLECTOMY     Patient Active Problem List   Diagnosis Date Noted   S/P cervical spinal fusion 08/22/2022   Elevated alkaline phosphatase level 07/02/2022   Diarrhea 07/02/2022   Mood disorder (HCC) 04/27/2022   Need for pneumococcal 20-valent conjugate vaccination 04/27/2022   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) 04/27/2022   Essential (hemorrhagic) thrombocythemia (HCC) 04/27/2022  Paresthesia of hand 04/24/2022   Carotid stenosis 03/26/2021   Acute renal failure due to traumatic rhabdomyolysis (HCC) 02/17/2021   Spinal stenosis of lumbar region 12/20/2020   H/O cervical spine surgery 12/20/2020   Abnormal MRI, cervical spine 12/20/2020   Abnormal MRI, lumbar spine 12/20/2020   Arthritis of neck 12/20/2020   Arthritis, lumbar spine 12/20/2020   Abnormal gait 12/20/2020   Frequent falls 12/20/2020   Cervical radiculopathy 12/20/2020   Numbness of fingers of both hands 12/20/2020   Carotid bruit present 10/06/2020   Noncompliance with treatment plan 09/16/2020   Polyp of colon 09/04/2020   Intrahepatic bile duct dilation 09/04/2020   Adrenal adenoma, left 09/04/2020   Atherosclerosis of renal artery (HCC) 09/04/2020   Elevated brain natriuretic peptide (BNP) level 08/09/2020   MDD (major depressive disorder),  recurrent, in full remission (HCC) 07/28/2020   Thoracic spine pain 07/28/2020   MDD (major depressive disorder), recurrent, in partial remission (HCC) 06/02/2020   Cervical spondylosis 05/24/2020   Aortic atherosclerosis (HCC) 05/02/2020   Tubular adenoma of colon 04/28/2020   High risk medication use 09/15/2019   At risk for long QT syndrome 01/13/2019   Chronic left shoulder pain 12/31/2018   Cervical postlaminectomy syndrome 10/20/2018   Alcohol use disorder, mild, abuse 09/16/2018   Pulmonary emphysema (HCC) 01/22/2018   HTN (hypertension) 05/06/2017   HLD (hyperlipidemia) 05/02/2017   Osteopenia 05/01/2017   Vitamin D  deficiency 01/27/2017   Chronic pain syndrome 01/27/2017   Chronic insomnia 01/27/2017   Tobacco use disorder 01/27/2017   Malignant neoplasm of lower-outer quadrant of left breast of female, estrogen receptor positive (HCC) 04/20/2016   Alcohol use 02/07/2016   Cervical post-laminectomy syndrome 09/06/2015   Lumbar radiculopathy 10/31/2012   Neuropathy 12/31/2006    PCP: No PCP   REFERRING PROVIDER: Dr. Pastor Falling    REFERRING DIAG: M54.12 (ICD-10-CM) - Cervical radiculopathy   THERAPY DIAG:  No diagnosis found.  Rationale for Evaluation and Treatment: Rehabilitation  ONSET DATE: 2008   SUBJECTIVE:                                                                                                                                                                                                         SUBJECTIVE STATEMENT: Pt states that she is having an improvement in her cervical pain since last session. She is receiving a steriod joint injection in right shoulder this upcoming Wednesday. She felt considerable relief last time after receiving the shot.  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   Hand  dominance: Right  PERTINENT HISTORY:  Pt reports that her symptoms started in 2008 and she experienced right sided neck pain. She has had a  number of cervical decompression surgeries starting in 2010, 2013 x2 and one last year. She describes having myelopathy that made her trip over her feet and that has since resolved since having the surgery. She also had prior surgery on ulnar nerve in elbows and two steroid joint injections in her right shoulder with the most recent being in May and she reports this helped significantly. She is currently feeling neurogenic pain that radiates down her right arm into the palmar surface of her hand and it is felt in all her fingers. It has been decreasing her grip strength in her right hand.    PAIN:  Are you having pain? Yes: NPRS scale: 7-8 (currently) Pain location: Right upper trap and down into palmar surface of hand  Pain description: Achy and throbbing    Aggravating factors: Using her right arm and hand like grasping objects   Relieving factors: Gabapentin  and Bacolofen    PRECAUTIONS: None  RED FLAGS: Cervical red flags: dysphagia but not worsening but she has noticed      WEIGHT BEARING RESTRICTIONS: No  FALLS:  Has patient fallen in last 6 months? Yes. Number of falls 4;  usually falls going up steps because she struggles with toe drag especially on the right leg     LIVING ENVIRONMENT: Lives with: lives alone Lives in: House/apartment Stairs: Yes: External: 4 steps; none Has following equipment at home: None  OCCUPATION: Not working    PLOF: Independent  PATIENT GOALS: To improve the pain and movement in her right arm and to know what to do to make it better and not to make it worse. She wants to return to being active with exercise, gardening, and yard work.   NEXT MD VISIT: Nothing scheduled at the moment    OBJECTIVE:  Note: Objective measures were completed at Evaluation unless otherwise noted.  VITALS: BP 182/75   HR 84  SpO2 100%   DIAGNOSTIC FINDINGS:  No imaging listed in chart for cervical region in past year.   PATIENT SURVEYS:  NDI:  NECK DISABILITY  INDEX  Date: 10/02/23 Score  Pain intensity 2 = The pain is moderate at the moment  2. Personal care (washing, dressing, etc.) 1 =  I can look after myself normally but it causes extra pain  3. Lifting 4 =  I can only lift very light weights  4. Reading 1 = I can read as much as I want to with slight pain in my neck  5. Headaches 0 = I have no headaches at all  6. Concentration 2 = I have a fair degree of difficulty in concentrating when I want to  7. Work 3 =  I cannot do my usual work  8. Driving 2 =  I can drive my car as long as I want with moderate pain in my neck  9. Sleeping 4 = My sleep is greatly disturbed (3-5 hrs sleepless)   10. Recreation 5 = I can't do any recreation activities at all  Total 24/50 (48%)   Minimum Detectable Change (90% confidence): 5 points or 10% points  COGNITION: Overall cognitive status: Within functional limits for tasks assessed  SENSATION: Light touch: Impaired   POSTURE: rounded shoulders  PALPATION: AC joint    CERVICAL ROM:   Active ROM A/PROM (deg) eval  Flexion 45  Extension  45  Right lateral flexion 40  Left lateral flexion 45  Right rotation 60  Left rotation 55   (Blank rows = not tested)                                     Active ROM Right eval Left eval  Shoulder flexion 110* 115*  Shoulder extension    Shoulder abduction 90* 110  Shoulder adduction    Shoulder internal rotation    Shoulder external rotation    Elbow flexion    Elbow extension    Wrist flexion    Wrist extension    Wrist ulnar deviation    Wrist radial deviation    Wrist pronation    Wrist supination    Combined ER  Occiput Occiput  Combined IR   PSIS PSIS                             (Blank rows = not tested)        UPPER EXTREMITY ROM:  Active/Passive ROM Right eval Left eval  Shoulder flexion 110* 115*  Shoulder extension    Shoulder abduction 60* 90*  Shoulder adduction    Shoulder extension    Shoulder internal rotation     Shoulder external rotation    Elbow flexion    Elbow extension    Wrist flexion    Wrist extension    Wrist ulnar deviation    Wrist radial deviation    Wrist pronation    Wrist supination     (Blank rows = not tested)  UPPER EXTREMITY MMT:  MMT Right eval Left eval  Shoulder flexion 3* 3+*  Shoulder extension    Shoulder abduction 3-* 3*  Shoulder adduction    Shoulder extension    Shoulder internal rotation    Shoulder external rotation    Middle trapezius    Lower trapezius    Elbow flexion    Elbow extension    Wrist flexion    Wrist extension    Wrist ulnar deviation    Wrist radial deviation    Wrist pronation    Wrist supination    Grip strength     (Blank rows = not tested)    TREATMENT DATE:   10/21/23: THEREX   Shoulder Flexion/Extension AAROM 2 x 10   -Pt reports worsening especially n/t  Shoulder Abduction/Adduction in place of Scaption AAROM 1 x 10 -Pt unable to perform due to increased pain   Supine Shoulder Flexion/Extension AAROM with PVC 1 x 10    -min VC to decrease flexion to avoid triggering numbness and tingling Supine Shoulder Protraction with PVC 1 x 10   Left Side Lying Right Shoulder Flexion with Silver Ball Roll 2 x 10 -Pt reports increased numbness and tingling     Supine Cervical Rotation  AROM 2 x 10   Supine Neck Extensor Stretch with forward flexion   2 x 30 sec    Supine Shoulder Extension and Scapular Isometrics 1 X 5 with 5 sec  -Pt reports increased n/t traveling down her spine        PATIENT EDUCATION:  Education details: Form and technique for correct performance of exercise.  Person educated: Patient Education method: Explanation, Demonstration, Verbal cues, and Handouts Education comprehension: verbalized understanding, returned demonstration, and verbal cues required  HOME EXERCISE PROGRAM: Access Code: [email or web address]  URL: [email or web address] Date: 10/21/2023 Prepared by: Toribio Servant  Exercises - Supine Cervical Rotation AROM on Pillow  - 1 x daily - 7 x weekly - 2 sets - 10 reps - Bend head forward when laying down  - 1 x daily - 7 x weekly - 2-3 reps - 60 sec hold - Seated Upper Trapezius Stretch  - 1 x daily - 7 x weekly - 3 reps - 60 sec  hold - Seated Upper Trapezius Stretch (Mirrored)  - 1 x daily - 7 x weekly - 3 reps - 60 sec  hold - Scare the Bear   - 1 x daily - 7 x weekly - 2 sets - 10 reps - Supine Scapular Retraction  - 1 x daily - 7 x weekly - 2 sets - 10 reps - 3 sec hold - Supine Shoulder Protraction with Dowel  - 1 x daily - 7 x weekly - 2 sets - 10 reps - Standing Isometric Shoulder External Rotation With Towel  - 1 x daily - 7 x weekly - 2 sets - 10 reps - 3 sec hold - Ice  - 1 x daily - 7 x weekly - 2 reps - 10-15 min hold   ASSESSMENT:  CLINICAL IMPRESSION: Pt largely limited to supine cervical and shoulder mobility and strengthening activities to avoid pain exacerbation with neck place in cervical flexion. She continues to be limited by pain and radicular symptoms with any extension at thoracic and cervical portion of spine. Modified home exercise accordingly by changing pulleys for AAROM shoulder activity in supine with wand, which patient was able to perform without as much pain and symptom provocation. She will continue to  benefit from skilled PT to address these aforementioned deficits to improve cervical and shoulder function to return to completing UE tasks like combing her hair and digging in dirt without being limited by pain and discomfort.      OBJECTIVE IMPAIRMENTS: difficulty walking, decreased ROM, decreased strength, impaired flexibility, impaired sensation, impaired UE functional use, postural dysfunction, and pain.   ACTIVITY LIMITATIONS: carrying, lifting, sleeping, bathing, toileting, dressing, reach over head, and hygiene/grooming  PARTICIPATION LIMITATIONS: meal prep, cleaning, laundry, driving, shopping, community  activity, and yard work  PERSONAL FACTORS: Age, Time since onset of injury/illness/exacerbation, and 1-2 comorbidities: HTN and  are also affecting patient's functional outcome.   REHAB POTENTIAL: Fair chronicity of cervical pain and prior rehab stints    CLINICAL DECISION MAKING: Evolving/moderate complexity  EVALUATION COMPLEXITY: Moderate   GOALS: Goals reviewed with patient? No  SHORT TERM GOALS: Target date: 10/16/2023  Patient will demonstrate undestanding of home exercise plan by performing exercises correctly with evidence of good carry over with min to no verbal or tactile cues .   Baseline:  Performing independently   Goal status: ACHIEVED    2.  Patient will understanding pain coping strategies like activity modification and graded motions to avoid pain exacerbations.  Baseline:  NT 10/15/23: Understands not to push into pain to avoid exacerbation Goal status: ACHIEVED       LONG TERM GOALS: Target date: 12/25/2023  Patient will show a statistically significant improvement in her neck function as evidence by >=7.5 pt decrease in her neck disability index score to better be able to move neck to improve visual field while driving to avoid accidents. Vernel et al, 2009) Baseline:  24/50 (48%) Goal status: ONGOING    2.  Patient will increase cervical, shoulder, and periscapular strength by >=1/3 grade MMT (  ie 4- to 4) for improved cervical function and stability to carry out UE tasks without pain and discomfort.  Baseline: Shoulder Flex R/L 3*/3+, Shoulder Abd 3-*/3 Goal status: ONGOING   3.  Patient will be able to perform overhead UE activities like reaching to grasp objects or combining hair without exceeding cervical pain that is >=4/10 NRPS as evidence of improved cervical function.  Baseline: 7-8/10 NRPS in right side of upper trap and  radiating down arm into palmar surface of hand. Goal status: ONGOING      PLAN:  PT FREQUENCY: 1-2x/week  PT DURATION: 12  weeks  PLANNED INTERVENTIONS: 97164- PT Re-evaluation, 97750- Physical Performance Testing, 97110-Therapeutic exercises, 97530- Therapeutic activity, 97112- Neuromuscular re-education, 97535- Self Care, 02859- Manual therapy, [phone]- Aquatic Therapy, [phone]- Electrical stimulation (unattended), [phone]- Electrical stimulation (manual), [phone] (1-2 muscles), 20561 (3+ muscles)- Dry Needling, Patient/Family education, Balance training, Joint mobilization, Joint manipulation, Spinal manipulation, Spinal mobilization, Vestibular training, DME instructions, Cryotherapy, Moist heat, and Biofeedback  PLAN FOR NEXT SESSION: Continue with cervical mobility exercises: side bending in supine, shoulder ER/IR AROM at 90 deg and 0 deg in supine   TENS Unit over spine.  Finish shoulder MMT and periscapular MMT. Median nerve glide.   Toribio Servant PT, DPT  [facility] [address] [place], [address] Phone: [phone]   Fax:  [phone]

## 2023-10-23 ENCOUNTER — Encounter: Admitting: Physical Therapy

## 2023-10-23 DIAGNOSIS — M19011 Primary osteoarthritis, right shoulder: Secondary | ICD-10-CM | POA: Diagnosis not present

## 2023-10-24 ENCOUNTER — Encounter: Admitting: Physical Therapy

## 2023-10-28 ENCOUNTER — Encounter: Admitting: Physical Therapy

## 2023-10-28 ENCOUNTER — Other Ambulatory Visit: Payer: Self-pay | Admitting: Neurology

## 2023-10-28 DIAGNOSIS — M19011 Primary osteoarthritis, right shoulder: Secondary | ICD-10-CM | POA: Diagnosis not present

## 2023-10-29 ENCOUNTER — Encounter: Payer: Self-pay | Admitting: Physical Therapy

## 2023-10-29 ENCOUNTER — Ambulatory Visit: Admitting: Physical Therapy

## 2023-10-29 DIAGNOSIS — M5412 Radiculopathy, cervical region: Secondary | ICD-10-CM

## 2023-10-29 DIAGNOSIS — M542 Cervicalgia: Secondary | ICD-10-CM

## 2023-10-29 NOTE — Therapy (Signed)
 OUTPATIENT PHYSICAL THERAPY CERVICAL TREATMENT     Patient Name: Ashley Cross MRN: [id number] DOB:July 13, 1955, 68 y.o., female Today's Date: 10/29/2023  END OF SESSION:  PT End of Session - 10/29/23 1647     Visit Number 5    Number of Visits 24    Date for Recertification  12/25/23    Authorization Type [id number]    Authorization - Visit Number 5    Authorization - Number of Visits 24    Progress Note Due on Visit 10    PT Start Time 1645    PT Stop Time 1730    PT Time Calculation (min) 45 min    Activity Tolerance Patient limited by pain    Behavior During Therapy [facility] for tasks assessed/performed           Past Medical History:  Diagnosis Date   Alcohol use    Anemia    hx   Anxiety    Aortic regurgitation 08/29/2020   Breast cancer (HCC)    Left breast(nipple mass) 2018   Cervical stenosis of spine    Chicken pox    Chronic back pain    Colon polyps    COPD (chronic obstructive pulmonary disease) (HCC)    Cyst of left nipple    Depression    Hypertension    Insomnia    hx   Personal history of radiation therapy    Past Surgical History:  Procedure Laterality Date   ANTERIOR CERVICAL DECOMP/DISCECTOMY FUSION N/A 08/22/2022   Procedure: Anterior Cervical Decompression Fusion  - Cervical three-Cervical four;  Surgeon: Joshua Alm Hamilton, MD;  Location: [facility] OR;  Service: Neurosurgery;  Laterality: N/A;   BACK SURGERY     x 3 (2010 x 2; 2013 x 1)  Dr. Joshua   BREAST BIOPSY Left    core- neg   BREAST LUMPECTOMY Left 2018   BREAST SURGERY Left    breast biopsy   CERVICAL CONE BIOPSY     CERVICAL FUSION     times 2   COLONOSCOPY WITH PROPOFOL  N/A 04/18/2015   Procedure: COLONOSCOPY WITH PROPOFOL ;  Surgeon: Donnice Vaughn Manes, MD;  Location: [facility] ENDOSCOPY;  Service: Endoscopy;  Laterality: N/A;   ELBOW SURGERY     2014 b/l elbows per pt ulnar nerve    ELBOW SURGERY     x2    EPIDURAL BLOCK INJECTION     Dr. Mindi  (multiple)   MASS EXCISION  Left 04/12/2016   Procedure: EXCISION OF LEFT NIPPLE MASS;  Surgeon: Jina Nephew, MD;  Location: [facility];  Service: General;  Laterality: Left;   NECK SURGERY     POSTERIOR CERVICAL FUSION/FORAMINOTOMY  03/07/2011   Procedure: POSTERIOR CERVICAL FUSION/FORAMINOTOMY LEVEL 4;  Surgeon: Alm GORMAN Joshua, MD;  Location: [facility] NEURO ORS;  Service: Neurosurgery;  Laterality: N/A;  cervical three to thoracic one posterior cervical fusion    SENTINEL NODE BIOPSY Left 05/29/2016   Procedure: LEFT SENTINEL LYMPH NODE BIOPSY;  Surgeon: Jina Nephew, MD;  Location: [facility] OR;  Service: General;  Laterality: Left;   TONSILLECTOMY     Patient Active Problem List   Diagnosis Date Noted   S/P cervical spinal fusion 08/22/2022   Elevated alkaline phosphatase level 07/02/2022   Diarrhea 07/02/2022   Mood disorder 04/27/2022   Need for pneumococcal 20-valent conjugate vaccination 04/27/2022   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) 04/27/2022   Essential (hemorrhagic) thrombocythemia (HCC) 04/27/2022  Paresthesia of hand 04/24/2022   Carotid stenosis 03/26/2021   Acute renal failure due to traumatic rhabdomyolysis 02/17/2021   Spinal stenosis of lumbar region 12/20/2020   H/O cervical spine surgery 12/20/2020   Abnormal MRI, cervical spine 12/20/2020   Abnormal MRI, lumbar spine 12/20/2020   Arthritis of neck 12/20/2020   Arthritis, lumbar spine 12/20/2020   Abnormal gait 12/20/2020   Frequent falls 12/20/2020   Cervical radiculopathy 12/20/2020   Numbness of fingers of both hands 12/20/2020   Carotid bruit present 10/06/2020   Noncompliance with treatment plan 09/16/2020   Polyp of colon 09/04/2020   Intrahepatic bile duct dilation 09/04/2020   Adrenal adenoma, left 09/04/2020   Atherosclerosis of renal artery 09/04/2020   Elevated brain natriuretic peptide (BNP) level 08/09/2020   MDD (major depressive disorder), recurrent, in full  remission 07/28/2020   Thoracic spine pain 07/28/2020   MDD (major depressive disorder), recurrent, in partial remission 06/02/2020   Cervical spondylosis 05/24/2020   Aortic atherosclerosis 05/02/2020   Tubular adenoma of colon 04/28/2020   High risk medication use 09/15/2019   At risk for long QT syndrome 01/13/2019   Chronic left shoulder pain 12/31/2018   Cervical postlaminectomy syndrome 10/20/2018   Alcohol use disorder, mild, abuse 09/16/2018   Pulmonary emphysema (HCC) 01/22/2018   HTN (hypertension) 05/06/2017   HLD (hyperlipidemia) 05/02/2017   Osteopenia 05/01/2017   Vitamin D  deficiency 01/27/2017   Chronic pain syndrome 01/27/2017   Chronic insomnia 01/27/2017   Tobacco use disorder 01/27/2017   Malignant neoplasm of lower-outer quadrant of left breast of female, estrogen receptor positive (HCC) 04/20/2016   Alcohol use 02/07/2016   Cervical post-laminectomy syndrome 09/06/2015   Lumbar radiculopathy 10/31/2012   Neuropathy 12/31/2006    PCP: No PCP   REFERRING PROVIDER: Dr. Pastor Falling    REFERRING DIAG: M54.12 (ICD-10-CM) - Cervical radiculopathy   THERAPY DIAG:  Cervicalgia  Cervical radiculopathy  Rationale for Evaluation and Treatment: Rehabilitation  ONSET DATE: 2008   SUBJECTIVE:                                                                                                                                                                                                         SUBJECTIVE STATEMENT: Pt reports that she just had a steroid joint injection in her right shoulder. She says that her physician told her that she would need a total shoulder replacement of her right shoulder.   Hand dominance: Right  PERTINENT HISTORY:  Pt reports that her symptoms started in 2008 and she experienced right sided neck pain.  She has had a number of cervical decompression surgeries starting in 2010, 2013 x2 and one last year. She describes having myelopathy that  made her trip over her feet and that has since resolved since having the surgery. She also had prior surgery on ulnar nerve in elbows and two steroid joint injections in her right shoulder with the most recent being in May and she reports this helped significantly. She is currently feeling neurogenic pain that radiates down her right arm into the palmar surface of her hand and it is felt in all her fingers. It has been decreasing her grip strength in her right hand.    PAIN:  Are you having pain? Yes: NPRS scale: 7-8 (currently) Pain location: Right upper trap and down into palmar surface of hand  Pain description: Achy and throbbing    Aggravating factors: Using her right arm and hand like grasping objects   Relieving factors: Gabapentin  and Bacolofen    PRECAUTIONS: None  RED FLAGS: Cervical red flags: dysphagia but not worsening but she has noticed      WEIGHT BEARING RESTRICTIONS: No  FALLS:  Has patient fallen in last 6 months? Yes. Number of falls 4;  usually falls going up steps because she struggles with toe drag especially on the right leg     LIVING ENVIRONMENT: Lives with: lives alone Lives in: House/apartment Stairs: Yes: External: 4 steps; none Has following equipment at home: None  OCCUPATION: Not working    PLOF: Independent  PATIENT GOALS: To improve the pain and movement in her right arm and to know what to do to make it better and not to make it worse. She wants to return to being active with exercise, gardening, and yard work.   NEXT MD VISIT: Nothing scheduled at the moment    OBJECTIVE:  Note: Objective measures were completed at Evaluation unless otherwise noted.  VITALS: BP 182/75   HR 84  SpO2 100%   DIAGNOSTIC FINDINGS:  No imaging listed in chart for cervical region in past year.   PATIENT SURVEYS:  NDI:  NECK DISABILITY INDEX  Date: 10/02/23 Score  Pain intensity 2 = The pain is moderate at the moment  2. Personal care (washing, dressing,  etc.) 1 =  I can look after myself normally but it causes extra pain  3. Lifting 4 =  I can only lift very light weights  4. Reading 1 = I can read as much as I want to with slight pain in my neck  5. Headaches 0 = I have no headaches at all  6. Concentration 2 = I have a fair degree of difficulty in concentrating when I want to  7. Work 3 =  I cannot do my usual work  8. Driving 2 =  I can drive my car as long as I want with moderate pain in my neck  9. Sleeping 4 = My sleep is greatly disturbed (3-5 hrs sleepless)   10. Recreation 5 = I can't do any recreation activities at all  Total 24/50 (48%)   Minimum Detectable Change (90% confidence): 5 points or 10% points  COGNITION: Overall cognitive status: Within functional limits for tasks assessed  SENSATION: Light touch: Impaired   POSTURE: rounded shoulders  PALPATION: AC joint    CERVICAL ROM:   Active ROM A/PROM (deg) eval  Flexion 45  Extension 45  Right lateral flexion 40  Left lateral flexion 45  Right rotation 60  Left rotation 55   (  Blank rows = not tested)                                     Active ROM Right eval Left eval  Shoulder flexion 110* 115*  Shoulder extension    Shoulder abduction 90* 110  Shoulder adduction    Shoulder internal rotation    Shoulder external rotation    Elbow flexion    Elbow extension    Wrist flexion    Wrist extension    Wrist ulnar deviation    Wrist radial deviation    Wrist pronation    Wrist supination    Combined ER  Occiput Occiput  Combined IR   PSIS PSIS                             (Blank rows = not tested)        UPPER EXTREMITY ROM:  Active/Passive ROM Right eval Left eval  Shoulder flexion 110* 115*  Shoulder extension    Shoulder abduction 60* 90*  Shoulder adduction    Shoulder extension    Shoulder internal rotation    Shoulder external rotation    Elbow flexion    Elbow extension    Wrist flexion    Wrist extension    Wrist ulnar  deviation    Wrist radial deviation    Wrist pronation    Wrist supination     (Blank rows = not tested)  UPPER EXTREMITY MMT:  MMT Right eval Left eval  Shoulder flexion 3* 3+*  Shoulder extension    Shoulder abduction 3-* 3*  Shoulder adduction    Shoulder extension    Shoulder internal rotation    Shoulder external rotation    Middle trapezius    Lower trapezius    Elbow flexion    Elbow extension    Wrist flexion    Wrist extension    Wrist ulnar deviation    Wrist radial deviation    Wrist pronation    Wrist supination    Grip strength     (Blank rows = not tested)    TREATMENT DATE:   10/29/23: THEREX   Shoulder IR/ER at 0 deg Abd AROM 3 x 8  Supine Shoulder ER/IR at 0 deg Abd AROM 3 x 8    Supine Press Ups AROM 1 x 10   Supine Press Ups AROM 1 x 10    -min VC to decrease retraction to avoid n/t radiating down arms   -Pt reports less n/t with decreased retraction   Bent Over Rows 2 x 10 with #2 DB   Side Lying Shoulder Horizontal Abduction 2 x 10   Seated Shoulder Abduction/Adduction in plane of scaption with cane  2 x 10    -min TC to bring shoulder forward to avoid provoking shoulder pain.  -Significant crepitus noted throughout movement.   10/21/23: THEREX   Shoulder Flexion/Extension AAROM 2 x 10   -Pt reports worsening especially n/t  Shoulder Abduction/Adduction in place of Scaption AAROM 1 x 10 -Pt unable to perform due to increased pain   Supine Shoulder Flexion/Extension AAROM with PVC 1 x 10    -min VC to decrease flexion to avoid triggering numbness and tingling Supine Shoulder Protraction with PVC 1 x 10   Left Side Lying Right Shoulder Flexion with Silver Ball Roll 2 x 10 -Pt  reports increased numbness and tingling     Supine Cervical Rotation  AROM 2 x 10   Supine Neck Extensor Stretch with forward flexion   2 x 30 sec    Supine Shoulder Extension and Scapular Isometrics 1 X 5 with 5 sec  -Pt reports increased n/t traveling down her  spine        PATIENT EDUCATION:  Education details: Form and technique for correct performance of exercise.  Person educated: Patient Education method: Explanation, Demonstration, Verbal cues, and Handouts Education comprehension: verbalized understanding, returned demonstration, and verbal cues required  HOME EXERCISE PROGRAM: Access Code: [email or web address] URL: [email or web address] Date: 10/29/2023 Prepared by: Toribio Servant  Exercises - Ice  - 1 x daily - 7 x weekly - 2 reps - 10-15 min hold - Seated Shoulder Flexion AAROM with Pulley in Front  - 1 x daily - 7 x weekly - 2 sets - 10 reps - Seated Shoulder Abduction AAROM with Pulley Behind  - 1 x daily - 7 x weekly - 2 sets - 10 reps - Seated Shoulder Abduction AAROM with Dowel  - 1 x daily - 7 x weekly - 2 sets - 10 reps - Seated Shoulder Abduction AAROM with Dowel (Mirrored)  - 1 x daily - 7 x weekly - 2 sets - 10 reps - Supine Cervical Rotation AROM on Pillow  - 1 x daily - 7 x weekly - 2 sets - 10 reps - Bend head forward when laying down  - 1 x daily - 7 x weekly - 2-3 reps - 60 sec hold - Seated Upper Trapezius Stretch  - 1 x daily - 7 x weekly - 3 reps - 60 sec  hold - Seated Upper Trapezius Stretch (Mirrored)  - 1 x daily - 7 x weekly - 3 reps - 60 sec  hold - Scare the Bear   - 1 x daily - 7 x weekly - 2 sets - 10 reps - Supine Shoulder Protraction with Dowel  - 1 x daily - 7 x weekly - 2 sets - 10 reps - Standing Isometric Shoulder External Rotation With Towel  - 1 x daily - 7 x weekly - 2 sets - 10 reps - 3 sec hold - Standing Bent Over Single Arm Scapular Row with Table Support  - 3-4 x weekly - 3 sets - 10 reps - Standing Bent Over Single Arm Scapular Row with Table Support (Mirrored)  - 3-4 x weekly - 3 sets - 10 reps - Sidelying Shoulder Horizontal Abduction  - 3-4 x weekly - 3 sets - 15 reps - Sidelying Shoulder Horizontal Abduction (Mirrored)  - 3-4 x weekly - 3 sets - 15 reps  ASSESSMENT:  CLINICAL  IMPRESSION: Pt continues to be limited by cervical neuropathic pain along with pain from bilateral shoulder OA. She was able to perform most shoulder mobility and strengthening as well as periscapular strengthening exercises albeit with significant modification to avoid end range extension, external rotation, and abduction. Pt needs to adjust frequency to 1x to week due to personal reasons. Pt provided pt with extensive home exercise plan to continue to work towards rehab goals. She will continue to  benefit from skilled PT to address these aforementioned deficits to improve cervical and shoulder function to return to completing UE tasks like combing her hair and digging in dirt without being limited by pain and discomfort.       OBJECTIVE IMPAIRMENTS: difficulty walking, decreased ROM, decreased strength, impaired flexibility, impaired sensation,  impaired UE functional use, postural dysfunction, and pain.   ACTIVITY LIMITATIONS: carrying, lifting, sleeping, bathing, toileting, dressing, reach over head, and hygiene/grooming  PARTICIPATION LIMITATIONS: meal prep, cleaning, laundry, driving, shopping, community activity, and yard work  PERSONAL FACTORS: Age, Time since onset of injury/illness/exacerbation, and 1-2 comorbidities: HTN and  are also affecting patient's functional outcome.   REHAB POTENTIAL: Fair chronicity of cervical pain and prior rehab stints    CLINICAL DECISION MAKING: Evolving/moderate complexity  EVALUATION COMPLEXITY: Moderate   GOALS: Goals reviewed with patient? No  SHORT TERM GOALS: Target date: 10/16/2023  Patient will demonstrate undestanding of home exercise plan by performing exercises correctly with evidence of good carry over with min to no verbal or tactile cues .   Baseline:  Performing independently   Goal status: ACHIEVED    2.  Patient will understanding pain coping strategies like activity modification and graded motions to avoid pain exacerbations.   Baseline:  NT 10/15/23: Understands not to push into pain to avoid exacerbation Goal status: ACHIEVED       LONG TERM GOALS: Target date: 12/25/2023  Patient will show a statistically significant improvement in her neck function as evidence by >=7.5 pt decrease in her neck disability index score to better be able to move neck to improve visual field while driving to avoid accidents. Vernel et al, 2009) Baseline:  24/50 (48%) Goal status: ONGOING    2.  Patient will increase cervical, shoulder, and periscapular strength by >=1/3 grade MMT (ie 4- to 4) for improved cervical function and stability to carry out UE tasks without pain and discomfort.  Baseline: Shoulder Flex R/L 3*/3+, Shoulder Abd 3-*/3 Goal status: ONGOING   3.  Patient will be able to perform overhead UE activities like reaching to grasp objects or combining hair without exceeding cervical pain that is >=4/10 NRPS as evidence of improved cervical function.  Baseline: 7-8/10 NRPS in right side of upper trap and  radiating down arm into palmar surface of hand. Goal status: ONGOING      PLAN:  PT FREQUENCY: 1-2x/week  PT DURATION: 12 weeks  PLANNED INTERVENTIONS: 97164- PT Re-evaluation, 97750- Physical Performance Testing, 97110-Therapeutic exercises, 97530- Therapeutic activity, 97112- Neuromuscular re-education, 97535- Self Care, 02859- Manual therapy, [phone]- Aquatic Therapy, [id number]- Electrical stimulation (unattended), [phone]- Electrical stimulation (manual), 20560 (1-2 muscles), 20561 (3+ muscles)- Dry Needling, Patient/Family education, Balance training, Joint mobilization, Joint manipulation, Spinal manipulation, Spinal mobilization, Vestibular training, DME instructions, Cryotherapy, Moist heat, and Biofeedback  PLAN FOR NEXT SESSION: Reassess long term goals and re-examine home exercise plan. Continue with cervical mobility exercises: side bending in supine, shoulder ER/IR AROM at 90 deg and 0 deg in supine   TENS Unit  over spine.  Toribio Servant PT, DPT  [facility] [address] [place], [address] Phone: [phone]   Fax:  [phone]

## 2023-10-30 ENCOUNTER — Ambulatory Visit: Admitting: Physical Therapy

## 2023-10-31 DIAGNOSIS — G894 Chronic pain syndrome: Secondary | ICD-10-CM | POA: Diagnosis not present

## 2023-10-31 DIAGNOSIS — F1120 Opioid dependence, uncomplicated: Secondary | ICD-10-CM | POA: Diagnosis not present

## 2023-11-05 ENCOUNTER — Ambulatory Visit: Admitting: Physical Therapy

## 2023-11-11 ENCOUNTER — Ambulatory Visit: Admitting: Physical Therapy

## 2023-11-13 ENCOUNTER — Ambulatory Visit: Admitting: Physical Therapy

## 2023-11-19 ENCOUNTER — Encounter: Admitting: Physical Therapy

## 2023-11-21 ENCOUNTER — Encounter: Admitting: Physical Therapy

## 2023-11-26 ENCOUNTER — Encounter: Admitting: Physical Therapy

## 2023-11-26 ENCOUNTER — Other Ambulatory Visit: Payer: Self-pay | Admitting: Hematology and Oncology

## 2023-11-26 DIAGNOSIS — F39 Unspecified mood [affective] disorder: Secondary | ICD-10-CM

## 2023-11-28 ENCOUNTER — Encounter: Admitting: Physical Therapy

## 2023-12-03 ENCOUNTER — Encounter: Admitting: Physical Therapy

## 2023-12-05 ENCOUNTER — Encounter: Admitting: Physical Therapy

## 2023-12-09 ENCOUNTER — Encounter: Admitting: Physical Therapy

## 2023-12-09 ENCOUNTER — Inpatient Hospital Stay: Payer: PPO | Admitting: Hematology and Oncology

## 2023-12-11 ENCOUNTER — Encounter: Admitting: Physical Therapy

## 2023-12-30 ENCOUNTER — Inpatient Hospital Stay: Attending: Hematology and Oncology | Admitting: Hematology and Oncology

## 2023-12-30 DIAGNOSIS — C50512 Malignant neoplasm of lower-outer quadrant of left female breast: Secondary | ICD-10-CM

## 2023-12-30 DIAGNOSIS — Z170 Estrogen receptor positive status [ER+]: Secondary | ICD-10-CM | POA: Diagnosis not present

## 2023-12-30 NOTE — Progress Notes (Signed)
 HEMATOLOGY-ONCOLOGY TELEPHONE VISIT PROGRESS NOTE  I connected with our patient on 12/30/23 at  3:30 PM EST by telephone and verified that I am speaking with the correct person using two identifiers.  I discussed the limitations, risks, security and privacy concerns of performing an evaluation and management service by telephone and the availability of in person appointments.  I also discussed with the patient that there may be a patient responsible charge related to this service. The patient expressed understanding and agreed to proceed.   History of Present Illness: Surveillance of breast cancer  History of Present Illness Ashley Cross is a 68 year old female who presents for follow-up after discontinuing anastrozole  due to worsening side effects.  She has been on anastrozole  for seven years and stopped at the end of August due to side effects. She experiences hair loss, acne, diarrhea, and low energy, initially attributing these to surgery but later recognizing them as medication side effects.  Her mammograms have been favorable, with letters indicating dense breasts, though her breast density is on the lower side.    Oncology History  Malignant neoplasm of lower-outer quadrant of left breast of female, estrogen receptor positive (HCC)  04/12/2016 Initial Diagnosis   Left breast biopsy/ lumpectomy: IDC grade 1, 0.8 cm with DCIS grade 1, margins negative, ER 100%, PR 100%, HER-2 negative, Ki-67 15%, T1b NX stage I a   05/21/2016 PET scan   Mild focal hypermetabolic in the left breast, no findings of metastatic disease. mild hypermetabolism is along the right costosternal junction favoring degenerative changes posttraumatic changes involving the right inferior scapular right sacrum bilateral pelvis and right lateral third and fourth ribs   05/29/2016 Surgery   1/4 sentinel lymph node positive with extracapsular extension    06/01/2016 Miscellaneous   Mammaprint low-risk:  Average 10 year risk of recurrence 10%; Mammaprint index +0.389   07/24/2016 - 09/07/2016 Radiation Therapy   Adjuvant radiation therapy   09/2016 -  Anti-estrogen oral therapy   Anastrozole , changed to Letrozole  after 1-2 months (patient unable to tolerate Anastrozole )     REVIEW OF SYSTEMS:   Constitutional: Denies fevers, chills or abnormal weight loss All other systems were reviewed with the patient and are negative. Observations/Objective:     Assessment Plan:  Malignant neoplasm of lower-outer quadrant of left breast of female, estrogen receptor positive (HCC) 04/12/2016 Left breast biopsy/ lumpectomy: IDC grade 10.8 cm with DCIS grade 1, margins negative, ER 100%, PR 100%, HER-2 negative, Ki-67 15%   PET/CT scan: Negative for metastatic disease Sentinel lymph node study: 1/4 sentinel nodes positive with extracapsular extension   Mammaprint low-risk: 10 year risk of recurrence 10% Adjuvant radiation therapy 07/24/2016- 09/07/2016   Treatment plan: Adjuvant antiestrogen therapy with anastrozole  1 mg daily 7 years switched to letrozole  01-27-2017- Aug 2025 (hair thinning, Hot flashes, Diarrhea, low energy)    Breast cancer surveillance: 1. Mammogram 10/22/2023: Benign breast density category B 2. MRI abdomen 02/17/2021: Mild intrahepatic biliary duct dilatation, pancreatic pseudocyst   Her daughter passed away in 01-27-2018 and she is still grieving from that. RTC on an as needed basis    I discussed the assessment and treatment plan with the patient. The patient was provided an opportunity to ask questions and all were answered. The patient agreed with the plan and demonstrated an understanding of the instructions. The patient was advised to call back or seek an in-person evaluation if the symptoms worsen or if the condition fails to improve as anticipated.  I provided 20 minutes of non-face-to-face time during this encounter.  This includes time for charting and coordination of  care   Ashley MARLA Chad, MD

## 2023-12-30 NOTE — Assessment & Plan Note (Signed)
 04/12/2016 Left breast biopsy/ lumpectomy: IDC grade 10.8 cm with DCIS grade 1, margins negative, ER 100%, PR 100%, HER-2 negative, Ki-67 15%   PET/CT scan: Negative for metastatic disease Sentinel lymph node study: 1/4 sentinel nodes positive with extracapsular extension   Mammaprint low-risk: 10 year risk of recurrence 10% Adjuvant radiation therapy 07/24/2016- 09/07/2016   Treatment plan: Adjuvant antiestrogen therapy with anastrozole  1 mg daily 7 years switched to letrozole  January 10, 2017  Letrozole  toxicities: Very occasional hot flashes.  Denies any major trouble from this.     Breast cancer surveillance: 1.  Breast exam 12/30/2023: Benign 2. mammogram 10/22/2023: Benign breast density category B 3. MRI abdomen 02/17/2021: Mild intrahepatic biliary duct dilatation, pancreatic pseudocyst   Her daughter passed away in 01-10-2018 and she is still grieving from that.

## 2024-02-13 ENCOUNTER — Telehealth: Payer: Self-pay

## 2024-02-13 DIAGNOSIS — I10 Essential (primary) hypertension: Secondary | ICD-10-CM

## 2024-02-13 MED ORDER — NEBIVOLOL HCL 5 MG PO TABS: 5.0000 mg | ORAL_TABLET | Freq: Every day | ORAL | 0 refills | Status: AC

## 2024-02-13 NOTE — Addendum Note (Signed)
 Addended by: ORLANDO KINGDOM on: 02/13/2024 04:21 PM   Modules accepted: Orders

## 2024-02-13 NOTE — Telephone Encounter (Signed)
 Patient has made her [facility] appointment with Dr Gretel. But she is still needing a refill if she can get a temp supply until her appointment sent to pharmacy the request was received from.

## 2024-02-13 NOTE — Telephone Encounter (Signed)
 I left voicemail for patient letting her know that we have received a refill request from [facility] and we noticed that she does not have a transfer of care appointment scheduled with one of our providers since Dr. Glenys Ferrari left.  I asked patient to please call and let us  know if she would like to continue with us  for primary care, and if so, we can schedule her TOC appointment.  I also sent a message to patient via MyChart.  E2C2 - when patient calls back, please assist her with scheduling a transfer of care appointment.

## 2024-02-13 NOTE — Telephone Encounter (Signed)
 SENT

## 2024-05-13 ENCOUNTER — Encounter: Admitting: Nurse Practitioner
